# Patient Record
Sex: Male | Born: 1935 | Race: White | Hispanic: No | Smoking: Former smoker
Health system: Southern US, Community
[De-identification: ages and names within clinical notes are randomized; demographics above are authoritative.]

## PROBLEM LIST (undated history)

## (undated) DIAGNOSIS — I2510 Atherosclerotic heart disease of native coronary artery without angina pectoris: Secondary | ICD-10-CM

## (undated) DIAGNOSIS — I10 Essential (primary) hypertension: Secondary | ICD-10-CM

## (undated) DIAGNOSIS — E785 Hyperlipidemia, unspecified: Secondary | ICD-10-CM

## (undated) DIAGNOSIS — R079 Chest pain, unspecified: Secondary | ICD-10-CM

## (undated) DIAGNOSIS — E119 Type 2 diabetes mellitus without complications: Secondary | ICD-10-CM

## (undated) HISTORY — DX: Chest pain, unspecified: R07.9

## (undated) HISTORY — PX: CORONARY ANGIOPLASTY: SHX604

## (undated) HISTORY — DX: Atherosclerotic heart disease of native coronary artery without angina pectoris: I25.10

## (undated) HISTORY — DX: Hyperlipidemia, unspecified: E78.5

## (undated) HISTORY — PX: CARDIAC SURGERY: SHX584

## (undated) HISTORY — DX: Essential (primary) hypertension: I10

## (undated) HISTORY — PX: EYE SURGERY: SHX253

---

## 1998-06-30 ENCOUNTER — Emergency Department (HOSPITAL_COMMUNITY): Admission: EM | Admit: 1998-06-30 | Discharge: 1998-07-01 | Payer: Self-pay | Admitting: Emergency Medicine

## 1998-06-30 ENCOUNTER — Encounter: Payer: Self-pay | Admitting: Emergency Medicine

## 1998-07-06 ENCOUNTER — Encounter: Payer: Self-pay | Admitting: *Deleted

## 1998-07-06 ENCOUNTER — Ambulatory Visit (HOSPITAL_COMMUNITY): Admission: RE | Admit: 1998-07-06 | Discharge: 1998-07-06 | Payer: Self-pay | Admitting: *Deleted

## 1999-09-17 ENCOUNTER — Ambulatory Visit (HOSPITAL_COMMUNITY): Admission: RE | Admit: 1999-09-17 | Discharge: 1999-09-17 | Payer: Self-pay | Admitting: Internal Medicine

## 1999-09-17 ENCOUNTER — Encounter: Payer: Self-pay | Admitting: Internal Medicine

## 2000-01-09 ENCOUNTER — Inpatient Hospital Stay (HOSPITAL_COMMUNITY): Admission: EM | Admit: 2000-01-09 | Discharge: 2000-01-11 | Payer: Self-pay | Admitting: Emergency Medicine

## 2000-01-09 ENCOUNTER — Encounter: Payer: Self-pay | Admitting: Emergency Medicine

## 2000-01-29 ENCOUNTER — Encounter (HOSPITAL_COMMUNITY): Admission: RE | Admit: 2000-01-29 | Discharge: 2000-04-28 | Payer: Self-pay | Admitting: *Deleted

## 2000-03-18 ENCOUNTER — Ambulatory Visit (HOSPITAL_COMMUNITY): Admission: RE | Admit: 2000-03-18 | Discharge: 2000-03-18 | Payer: Self-pay | Admitting: *Deleted

## 2000-05-19 ENCOUNTER — Ambulatory Visit (HOSPITAL_BASED_OUTPATIENT_CLINIC_OR_DEPARTMENT_OTHER): Admission: RE | Admit: 2000-05-19 | Discharge: 2000-05-19 | Payer: Self-pay | Admitting: *Deleted

## 2000-05-19 ENCOUNTER — Encounter (INDEPENDENT_AMBULATORY_CARE_PROVIDER_SITE_OTHER): Payer: Self-pay | Admitting: Specialist

## 2000-10-14 ENCOUNTER — Ambulatory Visit (HOSPITAL_COMMUNITY): Admission: RE | Admit: 2000-10-14 | Discharge: 2000-10-15 | Payer: Self-pay | Admitting: *Deleted

## 2000-10-14 ENCOUNTER — Encounter: Payer: Self-pay | Admitting: *Deleted

## 2001-01-12 ENCOUNTER — Inpatient Hospital Stay (HOSPITAL_COMMUNITY): Admission: EM | Admit: 2001-01-12 | Discharge: 2001-01-14 | Payer: Self-pay | Admitting: Emergency Medicine

## 2001-10-05 ENCOUNTER — Inpatient Hospital Stay (HOSPITAL_COMMUNITY): Admission: EM | Admit: 2001-10-05 | Discharge: 2001-10-06 | Payer: Self-pay | Admitting: Emergency Medicine

## 2001-10-05 ENCOUNTER — Encounter: Payer: Self-pay | Admitting: Cardiovascular Disease

## 2001-10-08 ENCOUNTER — Ambulatory Visit (HOSPITAL_COMMUNITY): Admission: RE | Admit: 2001-10-08 | Discharge: 2001-10-08 | Payer: Self-pay | Admitting: Cardiovascular Disease

## 2001-10-08 ENCOUNTER — Encounter: Payer: Self-pay | Admitting: Cardiovascular Disease

## 2001-12-11 ENCOUNTER — Ambulatory Visit (HOSPITAL_COMMUNITY): Admission: RE | Admit: 2001-12-11 | Discharge: 2001-12-11 | Payer: Self-pay | Admitting: Cardiovascular Disease

## 2001-12-11 ENCOUNTER — Encounter: Payer: Self-pay | Admitting: Cardiovascular Disease

## 2002-06-28 ENCOUNTER — Ambulatory Visit (HOSPITAL_COMMUNITY): Admission: RE | Admit: 2002-06-28 | Discharge: 2002-06-28 | Payer: Self-pay | Admitting: Cardiovascular Disease

## 2002-06-28 ENCOUNTER — Encounter: Payer: Self-pay | Admitting: Cardiovascular Disease

## 2002-11-09 ENCOUNTER — Encounter (HOSPITAL_BASED_OUTPATIENT_CLINIC_OR_DEPARTMENT_OTHER): Admission: RE | Admit: 2002-11-09 | Discharge: 2002-11-23 | Payer: Self-pay | Admitting: Internal Medicine

## 2002-12-22 ENCOUNTER — Encounter (HOSPITAL_BASED_OUTPATIENT_CLINIC_OR_DEPARTMENT_OTHER): Admission: RE | Admit: 2002-12-22 | Discharge: 2003-01-28 | Payer: Self-pay | Admitting: Internal Medicine

## 2003-02-18 ENCOUNTER — Encounter (HOSPITAL_BASED_OUTPATIENT_CLINIC_OR_DEPARTMENT_OTHER): Admission: RE | Admit: 2003-02-18 | Discharge: 2003-02-24 | Payer: Self-pay | Admitting: Internal Medicine

## 2003-05-25 ENCOUNTER — Encounter (HOSPITAL_BASED_OUTPATIENT_CLINIC_OR_DEPARTMENT_OTHER): Admission: RE | Admit: 2003-05-25 | Discharge: 2003-06-22 | Payer: Self-pay | Admitting: Internal Medicine

## 2003-11-08 ENCOUNTER — Ambulatory Visit (HOSPITAL_COMMUNITY): Admission: RE | Admit: 2003-11-08 | Discharge: 2003-11-08 | Payer: Self-pay | Admitting: Cardiovascular Disease

## 2003-11-08 HISTORY — PX: CARDIAC CATHETERIZATION: SHX172

## 2003-11-11 ENCOUNTER — Ambulatory Visit: Payer: Self-pay | Admitting: Internal Medicine

## 2003-11-17 ENCOUNTER — Inpatient Hospital Stay (HOSPITAL_COMMUNITY): Admission: AD | Admit: 2003-11-17 | Discharge: 2003-11-20 | Payer: Self-pay | Admitting: Cardiovascular Disease

## 2004-01-11 ENCOUNTER — Ambulatory Visit: Payer: Self-pay | Admitting: Internal Medicine

## 2004-01-16 ENCOUNTER — Ambulatory Visit: Payer: Self-pay | Admitting: Internal Medicine

## 2004-02-10 ENCOUNTER — Ambulatory Visit: Payer: Self-pay | Admitting: Internal Medicine

## 2004-04-05 ENCOUNTER — Ambulatory Visit: Payer: Self-pay | Admitting: Internal Medicine

## 2004-05-11 ENCOUNTER — Ambulatory Visit: Payer: Self-pay | Admitting: Internal Medicine

## 2004-05-25 ENCOUNTER — Ambulatory Visit: Payer: Self-pay | Admitting: Internal Medicine

## 2004-10-12 ENCOUNTER — Ambulatory Visit: Payer: Self-pay | Admitting: Internal Medicine

## 2005-02-08 ENCOUNTER — Ambulatory Visit: Payer: Self-pay | Admitting: Internal Medicine

## 2005-06-14 ENCOUNTER — Ambulatory Visit: Payer: Self-pay | Admitting: Internal Medicine

## 2005-10-31 ENCOUNTER — Observation Stay (HOSPITAL_COMMUNITY): Admission: EM | Admit: 2005-10-31 | Discharge: 2005-11-02 | Payer: Self-pay | Admitting: *Deleted

## 2005-11-01 HISTORY — PX: CARDIAC CATHETERIZATION: SHX172

## 2005-11-06 ENCOUNTER — Ambulatory Visit: Payer: Self-pay | Admitting: Gastroenterology

## 2005-11-08 HISTORY — PX: OTHER SURGICAL HISTORY: SHX169

## 2005-11-22 ENCOUNTER — Ambulatory Visit: Payer: Self-pay | Admitting: Internal Medicine

## 2006-04-25 ENCOUNTER — Ambulatory Visit: Payer: Self-pay | Admitting: Internal Medicine

## 2006-09-12 ENCOUNTER — Encounter: Payer: Self-pay | Admitting: Internal Medicine

## 2006-10-17 ENCOUNTER — Ambulatory Visit: Payer: Self-pay | Admitting: Internal Medicine

## 2007-01-22 ENCOUNTER — Encounter: Payer: Self-pay | Admitting: Internal Medicine

## 2007-10-14 ENCOUNTER — Telehealth: Payer: Self-pay | Admitting: Internal Medicine

## 2007-10-15 ENCOUNTER — Ambulatory Visit: Payer: Self-pay | Admitting: Internal Medicine

## 2007-10-15 DIAGNOSIS — I10 Essential (primary) hypertension: Secondary | ICD-10-CM | POA: Insufficient documentation

## 2007-10-15 DIAGNOSIS — J069 Acute upper respiratory infection, unspecified: Secondary | ICD-10-CM | POA: Insufficient documentation

## 2007-10-15 DIAGNOSIS — E119 Type 2 diabetes mellitus without complications: Secondary | ICD-10-CM | POA: Insufficient documentation

## 2007-11-04 ENCOUNTER — Telehealth: Payer: Self-pay | Admitting: Internal Medicine

## 2008-01-11 ENCOUNTER — Encounter: Payer: Self-pay | Admitting: Internal Medicine

## 2008-02-29 ENCOUNTER — Ambulatory Visit: Payer: Self-pay | Admitting: Internal Medicine

## 2008-02-29 DIAGNOSIS — M545 Low back pain, unspecified: Secondary | ICD-10-CM | POA: Insufficient documentation

## 2008-02-29 DIAGNOSIS — I2510 Atherosclerotic heart disease of native coronary artery without angina pectoris: Secondary | ICD-10-CM | POA: Insufficient documentation

## 2008-02-29 DIAGNOSIS — E785 Hyperlipidemia, unspecified: Secondary | ICD-10-CM | POA: Insufficient documentation

## 2008-02-29 DIAGNOSIS — K219 Gastro-esophageal reflux disease without esophagitis: Secondary | ICD-10-CM | POA: Insufficient documentation

## 2008-02-29 LAB — CONVERTED CEMR LAB
ALT: 14 units/L (ref 0–53)
AST: 20 units/L (ref 0–37)
Alkaline Phosphatase: 45 units/L (ref 39–117)
Basophils Absolute: 0.1 10*3/uL (ref 0.0–0.1)
Basophils Relative: 0.7 % (ref 0.0–3.0)
Bilirubin, Direct: 0.1 mg/dL (ref 0.0–0.3)
CO2: 28 meq/L (ref 19–32)
Chloride: 100 meq/L (ref 96–112)
Cholesterol: 138 mg/dL (ref 0–200)
LDL Cholesterol: 87 mg/dL (ref 0–99)
Lymphocytes Relative: 24.3 % (ref 12.0–46.0)
MCHC: 33.3 g/dL (ref 30.0–36.0)
Neutrophils Relative %: 58.8 % (ref 43.0–77.0)
Potassium: 5.4 meq/L — ABNORMAL HIGH (ref 3.5–5.1)
RBC: 4.92 M/uL (ref 4.22–5.81)
Sodium: 136 meq/L (ref 135–145)
Total Bilirubin: 0.7 mg/dL (ref 0.3–1.2)
VLDL: 16 mg/dL (ref 0–40)

## 2008-05-23 ENCOUNTER — Encounter: Payer: Self-pay | Admitting: Internal Medicine

## 2008-05-23 HISTORY — PX: NM MYOCAR PERF WALL MOTION: HXRAD629

## 2008-05-30 ENCOUNTER — Encounter: Payer: Self-pay | Admitting: Internal Medicine

## 2008-07-04 ENCOUNTER — Ambulatory Visit: Payer: Self-pay | Admitting: Internal Medicine

## 2008-09-26 ENCOUNTER — Ambulatory Visit: Payer: Self-pay | Admitting: Internal Medicine

## 2008-09-26 LAB — CONVERTED CEMR LAB: Hgb A1c MFr Bld: 7.2 % — ABNORMAL HIGH (ref 4.6–6.5)

## 2008-10-10 ENCOUNTER — Ambulatory Visit: Payer: Self-pay | Admitting: Internal Medicine

## 2010-05-25 NOTE — Cardiovascular Report (Signed)
[place]. [facility]  Patient:    [name], [name] Visit Number: [id number] MRN: [id number]          Service Type: MED Location: [phone] Attending Physician:  Daisey Must Dictated by:   Daisey Must, M.D. [facility] Proc. Date: 01/13/01 Admit Date:  01/12/2001   CC:         Justine Null, M.D. [facility]             Cardiac Catheterization Lab                        Cardiac Catheterization  PROCEDURE PERFORMED:  Left heart catheterization with coronary angiography and left ventriculography.  INDICATIONS:  Mr. Volkov is a 77 year old male with a history of coronary artery disease.  He had two stents placed in the mid left anterior descending artery in January 2002.  In October 2002, he had in-stent restenosis and was treated with cutting balloon angioplasty.  He presented now with recurrent episodes of progressive chest pain and was referred for cardiac catheterization.  PROCEDURAL NOTE:  A #6 French sheath was placed in the right femoral artery. Left coronary angiography was performed with a JL5 catheter.  Right coronary angiography was performed with a JR4 catheter.  Left ventriculography was performed with an angled pigtail catheter.  Contrast was Omnipaque.  There were no complications.  RESULTS:  Hemodynamics:  Left ventricular pressure 127/18.  Aortic pressure 127/68. There was no aortic valve gradient.  Left ventriculogram:  Wall motion is normal.  Ejection fraction was estimated at greater than or equal to 65%.  There was no mitral regurgitation.  Coronary arteriography (right dominant): 1. The left main has a distal 20% stenosis. 2. The left anterior descending artery has tandem stents in the mid LAD.    There is a diffuse 20% stenosis within the stent.  In the mid portion of    the stents is a focal 40% stenosis.  Just beyond the stents in the mid LAD    is a 20% stenosis.  The apical LAD which is relatively small in size has  an 80% stenosis.  The LAD gives rise to a normal sized first diagonal which    has a tubular 60-70% stenosis.  There is a small second diagonal and a    small third diagonal.  The third diagonal has a 60% stenosis at its origin. 3. The left circumflex has a 30% stenosis proximally.  It gives rise to a    small ramus intermedius followed by a large branching first obtuse    marginal.  There is a 30% stenosis in the proximal portion of the obtuse    marginal.  The distal circumflex gives rise to small second and third    obtuse marginal branches. 4. The right coronary artery has a 30% stenosis in the proximal to mid vessel.    In the distal vessel just before the posterior descending artery is a 20%    stenosis.  In the AV groove portion of the right coronary artery beyond the    posterior descending artery is a 30% stenosis.  The distal right coronary    artery gives rise to a large posterior descending artery and three small    posterolateral branches.  IMPRESSIONS: 1. Normal left ventricular systolic function. 2. One-vessel coronary artery disease as described with patent stents in the    mid left anterior descending artery.  There is moderate disease in  the    first diagonal branch and significant disease in the apical portion of the    left anterior descending artery which is fairly small in caliber and    supplies a small area of myocardium.  PLAN:  The patient will be managed medically. Dictated by:   Daisey Must, M.D. [facility] Attending Physician:  Daisey Must DD:  01/13/01 TD:  01/13/01 Job: [id number]

## 2010-05-25 NOTE — Cardiovascular Report (Signed)
[place]. [facility]  Patient:    [name], [name]                      MRN: [id number] Proc. Date: 03/18/00 Adm. Date:  [id number] Disc. Date: [id number] Attending:  Daisey Must CC:         Corwin Levins, M.D. [facility]  Cardiac Catheterization Laboratory   Cardiac Catheterization  PROCEDURES PERFORMED:  Left heart catheterization with coronary angiography and left ventriculography.  INDICATIONS:  [name] is a 77 year old male who underwent stent placement x 2 in the mid left anterior descending artery two months ago.  He has been experiencing somewhat atypical chest pain.  A stress Cardiolite showed a mild anterior and apical ischemia.  We therefore opted to proceed with cardiac catheterization to rule out re-stenosis.  DESCRIPTION OF PROCEDURE:  A 6 French sheath was placed in the right femoral artery.  Catheters utilized included a 6 [place] JL5, 6 [place] JR4 and 6 [place] angled pigtail.  Contrast was Omnipaque.  There were no complications.  RESULTS:  HEMODYNAMICS:  Left ventricular pressure 125/14.  Aortic pressure 125/70. There was no aortic valve gradient.  LEFT VENTRICULOGRAM:  Wall motion is normal.  Ejection fraction calculated at 62%.  There is no mitral regurgitation.  CORONARY ARTERIOGRAPHY:  (Right dominant).  Left main:  Left main has a distal 20% stenosis.  Left anterior descending:  The left anterior descending artery has two stents in the mid vessel with the first stent beginning just after the origin of the first diagonal branch.  Within the proximal portion of the first stent there is a discrete 40% stenosis.  In the second stent there is a discrete 50% stenosis.  Just beyond the stent is a diffuse 20% stenosis.  There is a normal sized first diagonal which has a mid 50% stenosis.  Left circumflex:  The left circumflex has a 20% stenosis at its origin.  It gives rise to a very large branching first obtuse marginal which has  a 20% stenosis.  The second obtuse marginal is small.  Right coronary artery:  The right coronary artery is a dominant vessel.  There is a diffuse 20% stenosis in the proximal vessel.  The distal vessel has minor luminal irregularities.  There is a normal sized posterior descending artery with a tubular 40% stenosis in the mid vessel.  The distal right coronary gives rise to three small posterolateral branches.  IMPRESSIONS: 1. Normal left ventricular systolic function. 2. Moderate disease within the stented portion of the mid left anterior    descending as described which does not appear to be flow-limiting    at this time.  PLAN:  The patient will be continued on medical therapy.  He will be observed closely with a followup Cardiolite scan performed in four to six months to rule out progressive re-stenosis. DD:  03/18/00 TD:  03/19/00 Job: [id number]

## 2010-05-25 NOTE — H&P (Signed)
[facility]  Patient:    [name], [name]                        MRN: [id number] Adm. Date:  01/08/00 Attending:  Corwin Levins, M.D. [facility]                         History and Physical  CHIEF COMPLAINT:  Chest pain.  HISTORY OF PRESENT ILLNESS:  [name] is a 77 year old white male with one week of exertional dull substernal chest discomfort, worse the last two to three weeks, now onset at approximately 10 yards, sometimes with mild shortness of breath and diaphoresis but no nausea or vomiting.  No prior history of coronary artery disease.  No palpitations or lightheadedness. There is fatigue and some sense of ankle puffiness off and on but no real swelling.  This pain is different from his occasional heartburn.  There is no pain now, none at rest in the week prior.  He had a stress test approximately 10 years at [facility] and negative.  Cardiac risk factors include diabetes, high cholesterol, family history.  Negative for tobacco and hypertension.  PAST MEDICAL HISTORY Illnesses: 1. Diabetes. 2. Hypercholesterolemia. 3. DJD of the hands. 4. GERD/hiatal hernia.  Surgeries:  Status post lumbar disk surgery in 1978.  ALLERGIES:  No known drug allergies.  CURRENT MEDICATIONS 1. Glucophage 500 mg b.i.d., which he only takes q.d. 2. Celebrex p.r.n. 3. Aspirin p.r.n. pain.  SOCIAL HISTORY:  Tobacco:  Quit for 25 years.  Alcohol:  Occasional beer. Married and lives in [place] with wife.  Worked for a [employer]; just retired, June 2001.  He is a former patient of Dr. Fayrene Fearing C. Charmian Muff, who has recently retired.  He saw Dr. Titus Dubin. Alwyn Ren recently for questionable spot on his chest x-ray after retirement physical.  Followup chest x-ray and ECG per Dr. Alwyn Ren apparently negative for significant abnormality.  FAMILY HISTORY:  Father with coronary artery disease.  REVIEW OF SYSTEMS:  Otherwise as above.  PHYSICAL EXAMINATION  GENERAL:  Mr.  [name] is alert and oriented x 3, pleasant.  VITAL SIGNS:  Blood pressure 175/85.  Heart rate 71.  Respirations 20. Temperature 97.1.  NECK:  Without JVD.  CHEST:  No rales or wheezing.  CARDIAC:  Regular rate and rhythm.  ABDOMEN:  Soft and nontender.  Positive bowel sounds.  No organomegaly.  No masses.  EXTREMITIES:  No edema.  NEUROLOGIC:  Cranial nerves II-XII intact, otherwise, nonfocal.  LABORATORY AND X-RAY FINDINGS:  ECG:  Sinus with occasional PVC, nonspecific ST-T wave changes.  No acute changes suggestive of ischemia specifically.  Chest x-ray pending.  CBC, CMET, CPK and troponin I pending.  ASSESSMENT AND PLAN 1. Chest pain consistent with accelerated/new-onset fairly typical exertional    angina consistent with flow-limiting coronary artery disease:  He is to be    admitted.  Given the minimal level of exertion required for symptoms, will    need to place on telemetry and rule out myocardial infarction with serial    enzymes.  Start aspirin 325 mg p.o. q.d. and Toprol XL 25 mg p.o. q.d.    Check lipids in the a.m.  Will need cardiology consultation.  Will likely    need cardiac catheterization to define anatomy and possibly render    treatment, etc.  Will need to follow up labs and chest x-ray, as above. 2. Diabetes mellitus:  Check  hemoglobin A1c.  Check capillary blood glucoses    and sliding-scale insulin.  Hold the Glucophage, given probable    catheterization and dye load.  Will hydrate tonight. 3. Hypertension, mildly increased today, no prior history of treatment per    patient; on Toprol, as above. 4. Hypercholesterolemia:  For statin if LDL increased greater than 100 and    proves to have coronary artery disease. 5. Gastroesophageal reflux disease:  Start empiric Prevacid 30 mg p.o. q.d. 6. Hand degenerative joint disease:  Okay to continue the Celebrex but also    some recent evidence Vioxx and ibuprofen increase cardiovascular risk. DD:   01/09/00 TD:  01/09/00 Job: [id number]

## 2010-05-25 NOTE — Discharge Summary (Signed)
[place]. [facility]  Patient:    [name], [name] Visit Number: [id number] MRN: [id number]          Service Type: MED Location: [phone] Attending Physician:  Talitha Givens Dictated by:   Tereso Newcomer, P.A. Admit Date:  01/12/2001 Discharge Date: 01/14/2001   CC:         Justine Null, M.D. [facility]                           Discharge Summary  DATE OF BIRTH:  1932-09-12  REASON FOR ADMISSION:  Unstable angina.  DISCHARGE DIAGNOSES: 1. One-vessel coronary artery disease--medical therapy.    a. Status post stenting x2 to the left anterior descending in January 2002.    b. Status post cutting balloon percutaneous transluminal coronary       angioplasty October 14, 2000 for a 95% in-stent restenosis.    c. Catheterization this admission revealing patent tandem stents in the mid       left anterior descending with 20% diffuse stenosis within the stent,       midportion of the stent with focal 40% stenosis, 20% stenosis in the mid       left anterior descending just beyond the stents, apical left anterior       descending (relatively small) with 80% stenosis; first diagonal branch       with tubular 60-70% stenosis, third diagonal branch with 60% stenosis at       the origin; left circumflex with 30% stenosis proximally, obtuse       marginal 30% proximal stenosis.  Right coronary artery with 30% stenosis       in the proximal mid vessel, 20% stenosis in the distal vessel just       before the posterior descending artery, 30% stenosis in the       arteriovenous groove portion of the right coronary artery beyond the       posterior descending artery.  Left ventriculogram with normal wall       motion with ejection fraction greater than 65%. 2. Hypertension. 3. Diabetes mellitus, type 2. 4. Hyperlipidemia. 5. Osteoarthritis.  HOSPITAL COURSE:  [name] is a 77 year old male with history of one-vessel CAD and PCI x2 within the last  year who presented to the office on January 12, 2001 with recurrent symptoms of unstable angina.  In the office, his blood pressure was 140/78.  Neck without JVD.  Heart normal S1, S2. Positive S4.  Sinus rhythm.  Lungs clear.  Abdomen soft.  Extremities with 1-2+ edema.  EKG normal sinus rhythm.  Heart rate 61.  No acute changes. [name] was admitted for unstable angina.  He was monitored on telemetry. Cardiac enzymes were negative x3.  He went for a cardiac catheterization on January 13, 2001 by Dr. Loraine Leriche Pulsipher.  The results are noted above in the discharge diagnoses.  Given the patients one-vessel CAD with many small vessel disease in first diagonal and apical LAD and patent stents in the mid LAD, it was felt that the patient should continue medical therapy.  Imdur 30 mg a day was added.  On January 14, 2001, the patient felt well without any further chest pain or shortness of breath.  His right groin was without hematoma or bruits.  It was felt that he was stable enough for discharge to home and would undergo outpatient rest stress Cardiolite testing in  1-2 weeks to evaluate his one-vessel CAD.  LABORATORY DATA:  White blood cell count 10,800, hemoglobin 13.1, hematocrit 38.5, platelet count 238,000.  Sodium 136, potassium 4, chloride 103, CO2 26, glucose 103, BUN 21, creatinine 0.9.  Cardiac enzymes negative x3.  DISCHARGE MEDICATIONS:  1. Imdur 30 mg a day.  2. Toprol XL 25 mg q.d.  3. Lipitor 10 mg q.h.s.  4. Cozaar 100 mg q.d.  5. Coated aspirin 325 mg q.d.  6. Actos 45 mg a day.  7. Prandin 2 mg t.i.d. with meals.  8. Nexium 40 mg q.d.  9. FOLTX q.d. 10. Glucophage XR 500 mg b.i.d. to be restarted on Thursday,     January 15, 2001. 11. Vioxx 25 mg a day. 12. Nitroglycerin p.r.n. chest pain.  ACTIVITY:  No driving, heavy lifting, exertion, or work for three days.  DIET:  Low-fat/low-sodium diabetic diet.  DISCHARGE INSTRUCTIONS:  He is to call our office for any  groin swelling, bleeding, or bruising.  He will be set up for an adenosine rest stress Cardiolite on Monday, January 20, at 8 a.m. and he will see Dr. Gerri Spore on the same day at 2 p.m. Dictated by:   Tereso Newcomer, P.A. Attending Physician:  Talitha Givens DD:  01/14/01 TD:  01/14/01 Job: [id number]

## 2010-05-25 NOTE — H&P (Signed)
[place]. [facility]  Patient:    [name], [name] Visit Number: [id number] MRN: [id number]          Service Type: EMS Location: [facility] Attending Physician:  Nelia Shi Dictated by:   Dian Queen, P.A.C. [facility] Admit Date:  01/12/2001   CC:         Justine Null, M.D. [facility]   History and Physical  CHIEF COMPLAINT:  Chest pain.  HISTORY OF PRESENT ILLNESS:  The patient is a 77 year old married white male with known coronary disease.  In January 2002, he was admitted with unstable angina and, at catheterization, was found to have a 95% lesion in a mid-LAD that was moderately angulated and difficulty to intervene upon.  Ultimately, the lesion was successfully dilated and stented x 2 with reduction in stenosis from 95% to 0% without complication.  This was done on January 10, 2000.  In March 2002, he was recatheterized and there was moderate disease within the stented portion of the LAD.  At that time, he was continued on medical therapy.  He was last catheterized on October 14, 2000 and the LAD lesion had stenosed to 95% (in the midportion of the stent), and this was opened to less than 20% with cutting balloon.  Subsequent to that, he did well for a couple of months but, for the last three weeks or so, has developed recurrent exertional chest pressure.  Over the weekend, he has had some left-sided pressure a little different from before, radiating down his left arm and relieved with nitroglycerin.  He feels that his symptoms are gradually worsening.  He feels a little "achy" in his left chest today.  He is admitted now for further evaluation and therapy and further intervention.  PAST MEDICAL HISTORY:  No prior hospitalizations other than above.  No prior surgery.  He does have dyslipidemia and diabetes.  ALLERGIES:  None to medications.  REVIEW OF SYSTEMS:  He does have some arthritis, for which he takes Vioxx.  No melena or hematochezia.  he has  a history of hemorrhoids, but no recent flare symptoms.  Decreased hearing in his right ear.  He wears partial upper and lower dentures.  No GU symptoms.  FAMILY HISTORY:  His father died of heart disease and mother of unknown etiology.  He has no siblings.  CURRENT MEDICATIONS:  1. Toprol XL 25 mg q.d.  2. Lipitor 20 mg q.d.  3. Altace 10 mg b.i.d. (he complains now of a chronic cough).  4. Aspirin 325 q.d.  5. Glucophage XR 5 mg b.i.d.  6. Nexium 40 mg q.d.  7. Vioxx 25 mg q.d.  8. ______ q.d.  9. Actos 45 mg q.d. 10. Prandin 2 mg q.d.  PHYSICAL EXAMINATION:  GENERAL:  Pleasant gentleman.  VITAL SIGNS:  Blood pressure 140/78, sinus rhythm.  HEENT:  Extraocular muscles intact.  Sclerae anicteric.  Conjunctivae injected bilaterally.  Face symmetrical.  Lids normal.  NECK:  Supple without thyromegaly.  No JVD or HJR.  No bruits.  HEART:  Sinus rhythm.  S1 and S2 normal.  S4 is present.  Quiet ______.  No S3.  LUNGS:  Clear bilaterally.  ABDOMEN:  Soft without masses, hepatosplenomegaly or bruits.  EXTREMITIES:  Active pedal pulses with 1 to 2+ edema.  NEUROLOGIC:  Normal.  IMPRESSION:  1. Coronary disease with prior LAD stent x 2 in January 2002 , cutting     balloon PTCA for in-stent restenosis on October 14, 2000, now  complaining     of recurrent chest pain compatible with recurrent angina.  2. Dyslipidemia.  3. Controlled hypertension.  DISPOSITION:  After conferring with Dr. Gerri Spore, will bring him into the hospital today for cardiac catheterization tomorrow and perhaps use brachy therapy, albeit the tortuosity of his vessel may preclude such.  Will begin heparin and IV nitroglycerin.  The patient was seen by myself and Dr. Myrtis Ser today. Dictated by:   Dian Queen, P.A.C. [facility] Attending Physician:  Nelia Shi DD:  01/12/01 TD:  01/12/01 Job: [id number]

## 2010-05-25 NOTE — Op Note (Signed)
[name]               ACCOUNT NO.:  [id number]   MEDICAL RECORD NO.:  [id number]          PATIENT TYPE:  INP   LOCATION:  3704                         FACILITY:  [facility]   PHYSICIAN:  Nanetta Batty, M.D.   DATE OF BIRTH:  Apr 14, 1932   DATE OF PROCEDURE:  DATE OF DISCHARGE:                                 OPERATIVE REPORT   [name] is a 77 year old white male with history of CAD, status post  LAD stenting in the past.  He was catheterized in January of 2003 and found  to have no evidence of in-stent restenosis.  He was catheterized in January  of 2005 and found to have a widely patent LAD stent with spasm of the ostium  of the RCA and a hypodense segmental mid-RCA lesion which appeared 50%  angiographically.  He was discharged home and underwent Cardiolite stress  testing that showed inferolateral ischemia.  He has had recurrent chest  pain.  He presents now for diagnostic RCA angiography with IVUS  interrogation and potential intervention.   PROCEDURE DESCRIPTION:  The patient was brought to the Second Floor [facility]iac Catheterization Laboratory in the postabsorptive state.  He was  premedicated with p.o. Valium and IV Nubain.  His right groin was prepped  and shaved in the usual sterile fashion.  One percent Xylocaine was used for  local anesthesia.  A 6 upgraded to a 7-French sheath was inserted into the  right femoral artery using the standard Seldinger technique.  A 6-French  sheath was inserted into the right femoral vein.  Initial angiography was  performed with a 5-French __________ catheter revealing the mid-RCA lesion.  The patient then received 4000 units of heparin, followed at the end of the  case by an additional 2000 units of heparin, making a total of 6000.  He  received aspirin this morning while on the floor and Plavix 600 mg p.o. in  the catheterization laboratory prior to intervention.  He received  Integrilin double bolus infusion.   Using a 6-French JR-4 guide catheter with side holes as well a __________  190 Sport guide wire, two 5 x 15 Maverick, PTCA was performed at nominal  pressures.  Following this, attempts were made to pass a 3.5 x 18 CYPHER;  however, this did not navigate the proximal bend of the RCA.  There was a  poor backup and guide wire bias.  Following this, the guide wire and  catheter was removed, and a 7-French hockey stick with side-hole guide  catheter along with the Sport guide wire and a BMW 190 buddy wire was then  used.  The 3.0 x 15 Maverick was then used to predilate the lesion once  again, and the 3.5 x 18 CYPHER was deployed over the [employer] wire using the  Sport as the buddy wire.  The stent passed easily to the desired location,  and the Sport wire was withdrawn.  The stent was then carefully positioned  under fluoroscopic and angiographic control and deployed at 12-14  atmospheres.  It was post dilated with a 3.75 x 15 Quantum  Maverick up to 16  atmospheres (3.8 mm).  Then 200 mcg of intracoronary nitroglycerin was then  administered.  The patient did complain of some back pain.  Post-procedure  IVUS revealed the interstrut diameter of approximately 3.2 mm;  however,  there was a small area of prolapse in the proximal stent seen by both IVUS  and angiography.  This was not flow limiting.   OVERALL IMPRESSION:  Successful intravascular ultrasound-guided percutaneous  intervention and stenting of a segmental mid-dominant right coronary lesion  using a 3.75 x 18 CYPHER stent.  The final ACT was 263.  The patient was  pain free at the end of the procedure.  The guide wire and catheter was  removed.  The sheath was securely in place.  The patient left the laboratory  in stable condition.  He will receive 18 hours of Integrilin and  treatment with aspirin and Plavix.  He will be discharged home in the  morning and will see me back in the office in 1-2 weeks for followup.  He  will need a  follow-up Cardiolite stress test in approximately 6-8 weeks.  He  left the laboratory in stable condition.       JB/MEDQ  D:  11/18/2003  T:  11/19/2003  Job:  [id number]   cc:   Second Floor [facility] Cardiiac Cath Lab   [facility]  [address]  [place], [place] [address]

## 2010-05-25 NOTE — Cardiovascular Report (Signed)
[place]. [facility]  Patient:    [name], [name]                      MRN: [id number] Proc. Date: 01/10/00 Adm. Date:  [id number] Attending:  Corwin Levins CC:         Corwin Levins, M.D. [facility]  Cath Lab   Cardiac Catheterization  PROCEDURE:  Coronary angiography, left ventriculography, LIMA angiography - Judkins technique.  INDICATIONS:  The patient is a 77 year old married white male with history of typical exertional angina, increased in frequency in the past few days.  RESULTS:  Pressures; LV 138/12, AO systolic 138/70.  ANGIOGRAPHY:  There is no significant calcification.  The left main coronary artery is normal.  The left anterior descending fills first diagonal with 70% lesion and there is a focal lesion after the first diagonal of 95%.  The remainder of the LAD is normal.  The circumflex is normal.  The right coronary artery is normal.  The left ventricle is normal.  The LIMA was normal.  Flush injection of the abdominal aorta revealed no aneurysm.  This was not recorded.  SUMMARY:  Single vessel coronary artery disease with 95% focal proximal LAD stenosis and 70% segmental lesion small caliber first diagonal.  The LV was normal and LIMA was patent.  The patient reviewed with Dr. Gerri Spore who felt that percutaneous intervention of the LAD was indicated. DD:  01/10/00 TD:  01/10/00 Job: [id number]

## 2010-05-25 NOTE — Discharge Summary (Signed)
NAME:  [name], [name]                         ACCOUNT NO.:  [id number]   MEDICAL RECORD NO.:  [id number]                   PATIENT TYPE:  INP   LOCATION:  2016                                 FACILITY:  [facility]   PHYSICIAN:  [name], M.D.             DATE OF BIRTH:  [birth year]   DATE OF ADMISSION:  10/05/2001  DATE OF DISCHARGE:                                 DISCHARGE SUMMARY   DISCHARGE DIAGNOSES:  1. Chest pain; a)  negative myocardial infarction; b)  stable coronary     artery disease.  2. Dilated aortic root.  3. History of coronary artery disease with stents times two in the left     anterior descending.  4. Hypertension.  5. Diabetes mellitus type 2.  6. Hyperlipidemia.   DISCHARGE CONDITION:  Improved.   PROCEDURE:  Combined left heart catheterization on October 06, 2001 by Dr.  Allyson [name].   DISCHARGE MEDICATIONS:  1. Toprol XL 25 mg daily.  2. Lipitor 20 mg daily.  3. Isosorbide 20 mg one pill twice daily.  4. Enteric coated aspirin 325 mg daily.  5. Actos 45 mg daily.  6. Prandin 2 mg daily.  7. Nexium 40 mg daily.  8. Foltx daily.  9. Altace 10 mg one pill twice a day.  10.      Vioxx 25 mg daily.  11.      No Glucophage XR until Friday morning, then resume regular dose 500     mg one three times a day.  Please check with the radiologist on Thursday     to see if he can restart the Glucophage on Friday.  12.      Nitroglycerin 1/150 mg sublingual p.r.n. chest pain.   DISCHARGE INSTRUCTIONS:  1. No strenuous activity, no sexual activity, no lifting over ten pounds     until Friday.  2. Low fat, low salt, diabetic diet.  3. Wash right groin cath site with soap and water.  Call if any bleeding,     swelling or drainage.  4. The patient is scheduled for a CAT scan of the chest at [facility] on October 08, 2001 at 11:45 a.m.  He is to go to admitting to     register prior to going to the x-ray department.  He is not to drink     after  midnight prior to the test.  5. Follow-up with Dr. Allyson [name] on Wednesday, October 14, 2001 at 11:15 a.m.     Call the office for any problems or questions at [phone].   HISTORY OF PRESENT ILLNESS:  The patient is a 77 year-old married male who  was changing cardiology service from [place] to [facility] and had an appointment in our office, but unfortunately developed  chest pain on October 05, 2001 and was admitted to [facility].   He has  known coronary artery disease as described under the past medical  history and had begun having recurrent shooting chest pain across the chest  with minimal walking.  The pain would last five to ten seconds. Also he had  constant left breast pain times three to four days.  In the emergency room  he was asymptomatic, but his episodes did feel similar to previous cardiac  pain. He was fatigued a great deal lately.   PAST MEDICAL HISTORY:  1. Coronary artery disease including stent times two to the left anterior     descending in January 2002, angioplasty in October 2002 for a 95% in     stent restenosis.  Cardiac catheterization was done January 2003 showing     patent tandem stents with 20% diffuse stent stenosis, 40% mid stent, 20%     mid LAD stenosis beyond the stents and apical 80% LAD, diagonal one with     a 60 to 70% stenosis, diagonal three with a 60% at origin; left     circumflex 30% proximal OM, 30% proximal RCA with 30% proximal mid vessel     and ejection fraction of 65%.  2. Hypertension.  3. Diabetes.  4. Hyperlipidemia.  5. Osteoarthritis of the knees and hands.  6. L5 back surgery in 1978.   OUTPATIENT MEDICATIONS:  1. Isosorbide mononitrate 20 mg one twice a day.  2. Toprol XL 25 mg daily.  3. Lipitor 20 mg q.h.s.  4. Enteric coated aspirin 325 mg daily.  5. Actos 45 mg daily.  6. Prandin 2 mg daily.  7. Nexium 40 mg daily.  8. Foltx daily.  9. Glucophage XR 500 mg t.i.d.  10.      Vioxx 25 mg  daily.  11.      Nitroglycerin p.r.n.Marland Kitchen  12.      Altace 10 mg p.o. b.i.d.   ALLERGIES:  No known allergies.   SOCIAL HISTORY:  Former pipe and cigar smoker 40 years ago.  Rare alcohol.  He is married with three kids and six grand kids.  Two daughter-in-laws.  He  is retired and works part time parking cars.   FAMILY HISTORY:  Father died in his 70's secondary to heart disease. Mother  died in her 46's to flu.  No siblings.   REVIEW OF SYMPTOMS:  See history and physical.   PHYSICAL EXAMINATION AT DISCHARGE:  VITAL SIGNS:  Blood pressure 134/70,  pulse 70, afebrile, respirations 20.  GENERAL:  Alert and oriented male.  CHEST:  Clear.  CARDIOVASCULAR:  Regular rate and rhythm. normal sinus rhythm.  The right  groin catheterization site is stable.   Cardiac catheterization:  The left ventricle was normal.  The aortic root  was somewhat dilated.  Renals were normal.  He had an LAD stent of diagonal  one which was patent.  He will be followed up with a  CT of his chest.   LABORATORY DATA:  Admitting hemoglobin 12.9, hematocrit 39.2, WBC 7.1,  platelets 220,000, neutrophils 61, lymphs 24.  These remained fairly stable.  Hemoglobin was 11.8 and hematocrit 36.1 at discharge.  Coagulations:  ProTime 13.3, INR 1.0, PTT 27.0 on admission with heparin 93.  Sodium 139,  potassium 4.5, chloride 106,  C02 25, glucose 119, BUN 19, creatinine 1.2.  SGOT 27, SGPT 27, albumin 3.5, calcium 8.6.  Cardiac enzymes:  CK ranged  from 180, 120 and 96, MB 3.6, 2.5 and 1.9, Troponin 0.01 and 0.01.  UA was  clear.  Fasting lipids had not been ordered.   Chest x-ray results are not yet on the chart.   EKG:  Sinus rhythm without acute ischemic changes.   HOSPITAL COURSE:  The patient was admitted on October 05, 2001 by Dr.  Allyson [name] with chest pain and unstable angina.  He was placed on IV heparin,  admitted to the telemetry unit and placed on Plavix.  Plans were made for cardiac catheterization which he  underwent on October 06, 2001.  Stents  were stable and non-critical coronary artery disease otherwise.   The patient's aortic root was somewhat dilated and he will get a CT of his  chest and follow-up with Dr. Allyson [name].       Darcella Gasman. Valarie Merino                     [name], M.D.    LRI/MEDQ  D:  10/06/2001  T:  10/09/2001  Job:  [id number]

## 2010-05-25 NOTE — H&P (Signed)
[name], [name]               ACCOUNT NO.:  [id number]   MEDICAL RECORD NO.:  [id number]          PATIENT TYPE:  INP   LOCATION:  3704                         FACILITY:  [facility]   PHYSICIAN:  Abelino Derrick, P.A.   DATE OF BIRTH:  1932/07/24   DATE OF ADMISSION:  11/17/2003  DATE OF DISCHARGE:  11/20/2003                                HISTORY & PHYSICAL   CHIEF COMPLAINT:  Chest pain.   HISTORY OF PRESENT ILLNESS:  [name] is a 77 year old male followed by  Nanetta Batty, M.D., who had an LAD stent in January 2002 by Carole Binning, M.D.  He had a re-look catheterization in September of 2003 and  again in November of 2005 for chest pain.  His catheterization of November 1  revealed a 50% RCA narrowing and some RCA spasm.  The previous LAD stent  site was patent.  The circumflex was normal.  He had normal left ventricular  function.  Dr. Allyson Sabal ordered a Cardiolite study after his catheterization  which was done November 3.  This did show some mild lateral ischemia and  some inferior ischemia.  The patient presented to the office today for  follow-up of these studies.  He had been complaining of chest pain.  He says  it is low level, about 2 to 3/10. He did take a nitroglycerin without  relief.  Dr. Allyson Sabal feels it is best to admit him for heparin and nitrates  and restudy with IVUS.   PAST MEDICAL HISTORY:  Remarkable for hypertension, treated hyperlipidemia,  non-insulin dependent diabetes.  He has had a previous granuloma on a chest  CT.   CURRENT MEDICATIONS:  1.  Metformin 500 mg twice daily.  2.  Lipitor 40 mg a day.  3.  Aspirin 325 mg a day.  4.  Metoprolol 25 mg a day.  5.  Omeprazole 20 mg a day.  6.  Lisinopril/HCTZ 20/12.5 twice daily.  7.  Imdur 30 mg a day.  8.  Actos 15 mg a day.   ALLERGIES:  No known drug allergies.   SOCIAL HISTORY:  He is married with three children.  He does not smoke.  He  is here with his wife in the office on  admission.   FAMILY HISTORY:  Unremarkable for coronary disease.   REVIEW OF SYSTEMS:  Essentially unremarkable except as noted above.  He has  had some fatigue and some increased dyspnea.   PHYSICAL EXAMINATION:  VITAL SIGNS:  Blood pressure 110/60, pulse 70, weight  246, respiratory rate 12.  GENERAL:  He is a well-developed overweight male in no acute distress.  HEENT:  Normocephalic.  Extraocular movements are intact.  Sclerae are  nonicteric.  Lids and conjunctivae are within normal limits.  NECK:  Without  bruits, without JVD.  CHEST:  Clear to auscultation and percussion.  CARDIAC:  Reveals regular rate and rhythm, without murmurs, rubs or gallops,  normal S1 and S2.  ABDOMEN:  Obese, nontender, no hepatosplenomegaly.  EXTREMITIES:  Without edema.  Distal pulses are intact.  The right femoral  artery site  is without hematoma or ecchymosis.  NEURO:  Exam is grossly intact. He is awake, alert, oriented and  cooperative, moves all extremities without obvious deficit.   LABORATORY DATA:  EKG:  Sinus rhythm, sinus tachycardia without changes.   IMPRESSION:  1.  Chest pain, worrisome for unstable angina.  2.  Coronary disease with left anterior descending stent in January of 2002      and recent catheterization on November 08, 2003 showing a patent LAD      stent site but a 50% right coronary artery with an abnormal Cardiolite      study showing some inferior ischemia on November 10, 2003, rule out      progression.  3.  Non-insulin dependent diabetes.  4.  Treated hypertension.  5.  Treated hyperlipidemia.  6.  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   PLAN:  Patient will be seen by Dr. Allyson Sabal and myself today in the office.  He  is to be admitted to [facility] and set up IV heparin and nitrates  and repeat catheterization with an  IVUS of his right coronary artery  tomorrow.       LKK/MEDQ  D:  01/21/2004  T:  01/21/2004  Job:  [id number]   cc:   Nanetta Batty,  M.D.  Fax: [phone]

## 2010-05-25 NOTE — Consult Note (Signed)
NAME:  [name], [name]                         ACCOUNT NO.:  [id number]   MEDICAL RECORD NO.:  [id number]                   PATIENT TYPE:  REC   LOCATION:                                       FACILITY:  [facility]   PHYSICIAN:  Jonelle Sports. Sevier, M.D.              DATE OF BIRTH:  26-Sep-1932   DATE OF CONSULTATION:  11/15/2002  DATE OF DISCHARGE:                                   CONSULTATION   REFERRING PHYSICIAN:  Sean [name]. Everardo All.   HISTORY:  This 77 year old white male is referred at the courtesy of Dr.  Everardo All for management of [name] blister on the tip of the right hallux and  several other minor foot problems.  Apparently, his diabetes has been in  good control with hemoglobin A16 several months ago of 6.0.  He has not had  significant prior foot problems, although he has had some interdigital  fungus at times and has [name] fissure which tends to occur on the plantar aspect  at the base of the right 4th toe.  This has been recently active, but not [name]  major problem to him.  Some two weeks ago, he rubbed [name] blister on the tip of  the right great toe, presumably from ill-fitting footwear, and was referred  here for that purpose.  Before he could get here, because of my absence, he  went to see Dr. Tinnie Gens [name]. Petrinitz, who drained the fluid from it and, in  fact, it subsequently has healed quite satisfactorily.  He is here now for  our general evaluation and ongoing care.   PAST MEDICAL HISTORY:  Past medical history is notable for osteoarthritis,  coronary artery disease, hyperlipidemia and gastroesophageal reflux.   ALLERGIES:  He has no known medicinal allergies.   MEDICINES:  His regular medicines include Prandin, Glucophage, Actos, Imdur,  metoprolol, Lipitor, Zestoretic, aspirin and omeprazole.   EXAMINATION:  EXTREMITIES:  Examination today is limited to the distal lower  extremities.  The feet are free of gross edema and skin temperatures are  essentially normal and symmetrical.   All pulses are palpable and adequate.  He has protective sensation in his toes but some failure to detect 5 g  monofilament testing under the metatarsal head areas.   On the tip of the right hallux is [name] healed blister which now has left behind  callus-type tissue.  He also has calluses at the tip of the left hallux, tip  of the right 2nd and 4th toes and tip of the left 3rd toe.  He has minor  callus formation under the 1st and 4th metatarsal heads bilaterally.  He has  some interdigital peeling on both feet and indeed [name] crack at the plantar  aspect base of the right 4th toe.   DISPOSITION:  1. The patient is given general instruction regarding footwear and diabetes     by video with nurse  and physician reinforcement.  2. The callused area where the blister previously existed on the tip of the     right hallux is sharply pared away without incident.  The calluses on the     tips of the left hallux, left 3rd toe, and right 2nd and 4th toes are     sharply pared without incident.  The nurse has previously dremeled some     callus material at both heels.  3. The patient is advised to used interdigital Lamisil on both feet and to     obtain lamb's wool spacers to use until this     has healed.  He is particularly to reinforce the area between the 3rd and     4th toes on the right in order to hopefully get this fissure healed.  4. Assuming he does well, he will return here only on [name] nail-care-basis     every three months.                                               Jonelle Sports. Cheryll Cockayne, M.D.    RES/MEDQ  D:  11/15/2002  T:  11/15/2002  Job:  [id number]   cc:   Gregary Signs [name]. Everardo All, M.D. [facility]

## 2010-05-25 NOTE — Cardiovascular Report (Signed)
[name], [name]               ACCOUNT NO.:  [id number]   MEDICAL RECORD NO.:  [id number]          PATIENT TYPE:  OIB   LOCATION:  2899                         FACILITY:  [facility]   PHYSICIAN:  Nanetta Batty, M.D.   DATE OF BIRTH:  [birth year]   DATE OF PROCEDURE:  11/08/2003  DATE OF DISCHARGE:                              CARDIAC CATHETERIZATION   Mr. Wilkie is a 77 year old gentleman status post proximal mid  LAD, PCI  stenting by Dr. Emilie Rutter. Pulsipher in the past.  He was recatheterized by  myself in 2003 and found to have a patent stent with normal RCA.  So the  problems include type 2 diabetes, hyperlipidemia.  He has had crescendo  angina over the last two weeks and was admitted now electively for  diagnostic coronary arteriography.   DESCRIPTION OF PROCEDURE:  The patient was brought to the second floor [facility] cardiac catheterization lab in the postabsorptive  state.  He was premedicated with p.o. Valium.  His right groin was prepped  and draped in the usual sterile fashion.  Xylocaine 1% was used for local  anesthesia.  A 6 French sheath was inserted into the right femoral artery  using standard Seldinger technique.  The 6 French right and left Judkins  Silastic catheters (JL5 for the left and 5 [place] No Torque catheter were  used for selective coronary artery angiography, left ventriculography,  supravalvular aortography and distal abdominal aortography).  Isovue dye was  used for the entirety of the case.  _________ aortic, left ventricular, and  pullback pressures were recorded.  The patient also received 200 mcg of  intracoronary nitroglycerin because of proximal and mid RCA spasm.   HEMODYNAMICS:  Aortic systolic pressure 131, diastolic pressure 67.  Left  ventricular systolic pressure 132 and diastolic pressure 17.   SELECTIVE CORONARY ANGIOGRAPHY:  1.  Left main normal.  2.  LAD:  The proximal mid stent widely patent.  3.  Left  circumflex:  Widely patent.  4.  Right coronary artery:  Initially with intubation of the JR4, there was      ostial and mid spasm.  There were 200 mcg of intracoronary nitroglycerin      then administered and a 5 French No Torque catheter was then used to      further image the RCA.  The proximal spasm resolved, however, there was      a segmental 40 to 50% mid stenosis which did not appear to be      atherosclerotic, however, did not resolve.   SUPRAVALVULAR AORTOGRAPHY:  Supravalvular aortogram was performed using 20  mL of Visipaque dye, 20 mL per second.  The aortic root was moderately  dilated though there was no aortic insufficiency noted.   DISTAL ABDOMINAL AORTOGRAPHY:  Distal abdominal aortogram was performed  using 20 mL of Visipaque dye at 20 mL per seconds.  The renal arteries were  widely patent.  The infrarenal abdominal aorta, iliac bifurcation were free  of significant atherosclerotic changes.   IMPRESSION:  Mr. Egelston has widely patent proximal mid left  anterior  descending.  He has right coronary artery spasm.  Consideration was given to  IVUS interrogation of the mid right coronary artery, however,  angiographically, this did not appear to be atherosclerotic and ultimately  plans will be to discharge the patient home later today and obtain  outpatient Persantine Cardiolite stress test.   The sheaths were removed and pressure was held on the groin to achieve  hemostasis.  The patient left the lab in stable condition.       JB/MEDQ  D:  11/08/2003  T:  11/08/2003  Job:  [id number]   cc:   Second Floor [facility] Cardiac Cath Lab   [facility]  [address]  [place], [place] [address]   Gordy Savers, M.D. [facility]

## 2010-05-25 NOTE — Cardiovascular Report (Signed)
NAMEHERMENEGILDO, CLAUSEN               ACCOUNT NO.:  [id number]   MEDICAL RECORD NO.:  [id number]          PATIENT TYPE:  INP   LOCATION:  2037                         FACILITY:  [facility]   PHYSICIAN:  Madaline Savage, M.D.DATE OF BIRTH:  [birth year]   DATE OF PROCEDURE:  11/01/2005  DATE OF DISCHARGE:                              CARDIAC CATHETERIZATION   PROCEDURES PERFORMED:  1. Selective coronary angiography by Judkins technique.  2. Retrograde left heart catheterization.  3. Left ventricular angiography.   INTERVENTIONS:  None.   DYE USED:  Omnipaque 100 mL   PATIENT PROFILE:  The patient is a 77 year old gentleman who has had  previous coronary artery stenting by Dr. Loraine Leriche Pulsipher to the LAD and more  recently he had RCA stent placed to the right coronary artery by Dr.  Nanetta Batty.  He enters the hospital at this time because of increasing  episodes of recurrent chest pain that are anginal-like and respond to  nitroglycerin.  Today's procedure was performed electively with an intent to  conserve dye and no complications were noted.   RESULTS:   PRESSURES:  1. The left ventricular pressure was 125/13.  2. End-diastolic pressure 20.  3. Central aortic pressure 125/55, mean of 81.  4. No aortic valve gradient by pullback technique.   ANGIOGRAPHIC RESULTS:  1. I first visualized the right coronary artery.  It was a very large and      dominant vessel.  It gave rise to a large pulmonary conus branch, a      medium-size AV-nodal branch.  The RCA itself was about 3.5 to 3.75-mm      in diameter, proximal and mid.  Then it became a large long posterior      descending branch and a large trifurcating posterolateral branch and      one acute marginal branch.  All vessels appeared  widely patent.  There      was a radio-opaque stent in the downgoing limb of the mid coronary      artery.  The stent appeared pristine in appearance.  There was TIMI      flow throughout.  2. Left  main coronary artery was medium in size and fairly short.  No      lesions were seen.  3. LAD coursed the cardiac apex, giving rise to one major diagonal branch      arising well before first septal perforator.  No lesions were seen      throughout the LAD and its diagonal branches.  The mid-LAD just beyond      septal perforator branch #1 contained the first stent which was      overlapped with a second stent in the mid LAD and the blood flow was      pristine throughout the stent borders looked very good.  4. Circumflex coronary artery consisted of one large obtuse marginal which      bifurcated and a fairly small but long circumflex.  No lesions seen.   LEFT VENTRICULAR ANGIOGRAPHY:  Showed good contractility of all wall  segments.  Ejection fraction estimated  at 60%.  No mitral regurgitation  seen.   FINAL DIAGNOSES:  1. Angiographically patent coronary arteries with one patent stent in the      mid right coronary artery, and two overlapping patent stents in the mid-      left anterior descending artery.  No new lesions seen since the time of      the last catheterization.  2. Normal left ventricular systolic function.  3. Dye limited study because of creatinine elevations.   PLAN:  The patient will be recovered in the holding area and returned to  telemetry monitoring because of his PVCs.  I do recommend a GI workup for  this patient who seems to have a legitimate problem with chest pain it just  does not seem cardiac.           ______________________________  Madaline Savage, M.D.     WHG/MEDQ  D:  11/01/2005  T:  11/02/2005  Job:  [id number]   cc:   Nanetta Batty, M.D.  Cardiac Cath Lab, [facility]

## 2010-05-25 NOTE — Cardiovascular Report (Signed)
NAME:  [name] [name], [name] [name]                         ACCOUNT NO.:  [id number]   MEDICAL RECORD NO.:  [id number]                   PATIENT TYPE:  INP   LOCATION:  2016                                 FACILITY:  [facility]   PHYSICIAN:  Runell Gess, M.D.             DATE OF BIRTH:  [birth year]   DATE OF PROCEDURE:  DATE OF DISCHARGE:                              CARDIAC CATHETERIZATION   PROCEDURE PERFORMED:  Cardiac catheterization.   INDICATION:  The patient is [name] 77 year old married white male, previously [name]  patient of Dr. Loraine Leriche [name] at [facility].  He has [name] history of  LAD stenting, January 2002.  He was last catheterized January 13, 2001, and  found to have no evidence of in-stent re-stenosis or critical CAD. He  presented October 05, 2001, with exertional chest pain. His ECG was  nonischemic and his first set of enzymes are negative.  He subsequently  ruled out for myocardial infarction. He was placed on IV heparin and  presents now for diagnostic coronary arteriography.   DESCRIPTION OF PROCEDURE:  The patient was brought to the second floor [facility] in the postabsorptive state.  He was  premedication with p.o. Valium. His right groin was prepped and shaved in  the usual sterile fashion.  Then 1% Xylocaine was used for local anesthesia.  [name] 6 French sheath was inserted into the right femoral artery using standard  Seldinger technique. [name] 6 French right and left Judkins diagnostic catheter  as well as [name] 6 French pigtail catheter were used for selective coronary  angiography, left ventriculography, and supravalvular aortography in the LAO  cranial view and distal abdominal aortography.  Omnipaque dye was used for  the entirety of the case. Retrograde, aortic, left ventricular, and pullback  pressures were recorded.   HEMODYNAMICS:  1. Aortic systolic pressure 138, diastolic pressure 64.  2. Left ventricular systolic pressure 140,  end-diastolic pressure 17.   SELECTIVE CORONARY ANGIOGRAPHY:  1. Left main:  Normal.  2. Left anterior descending:  The LAD proximal obstructed segment was widely     patent. There was at most 20-30% in-stent re-stenosis.  The first     diagonal branch, which is [name] large vessel, had at most 40-50% proximal     segmental stenosis.  3. Left circumflex:  Free of significant disease.  4. Right coronary artery:  Dominant and free of significant disease.   LEFT VENTRICULOGRAPHY:  RAO left ventriculogram was performed using 25 cc of  Omnipaque dye at 12 cc/sec.  The overall LVEF was estimated at greater than  60% without focal wall motion abnormalities.   SUPRAVALVULAR AORTOGRAPHY:  Supravalvular aortogram was performed in the RAO  cranial view using 20 cc of Omnipaque dye at 20 cc/sec.  The aortic root  appeared dilated, however, there was no evidence of aortic insufficiency and  the arch vessels appeared intact.   DISTAL  ABDOMINAL AORTOGRAPHY:  Distal abdominal aortogram was performed  using  25 cc of Omnipaque dye at 20 cc/sec. The renal arteries were widely patent.  The inferior abdominal aorta and iliac bifurcation appear free of  significant atherosclerotic changes.   IMPRESSION:  The patient has new evidence of in-stent re-stenosis with  normal left ventricular function.  His aortic root does appear dilated and I  would recommend he obtain an outpatient chest CT for baseline measurements.   ACT was measured and the sheaths were removed. Pressure was held on the  groin to achieve hemostasis. The patient left the lab in stable condition.  He will be discharged home later today and will see me back in the office in  2-3 weeks for followup.                                                 Runell Gess, M.D.    JJB/MEDQ  D:  10/06/2001  T:  10/09/2001  Job:  [id number]   cc:   Cardiac Catheterization Laboratory   [facility]

## 2010-05-25 NOTE — Discharge Summary (Signed)
[name], [name]               ACCOUNT NO.:  [id number]   MEDICAL RECORD NO.:  [id number]          PATIENT TYPE:  INP   LOCATION:  3704                         FACILITY:  [facility]   PHYSICIAN:  Nanetta Batty, M.D.   DATE OF BIRTH:  Jun 04, 1932   DATE OF ADMISSION:  DATE OF DISCHARGE:  11/20/2003                                 DISCHARGE SUMMARY   DISCHARGE DIAGNOSES:  1.  Unstable angina.  2.  Right coronary artery Cypher stent this admission after intravascular      ultrasound.  3.  Previous history of left anterior descending artery stenting, January      2002.  4.  Transient hypertension.  5.  Transient hyperlipidemia.  6.  Non-insulin-dependent diabetes.  7.  Obesity.   HOSPITAL COURSE:  The patient is a 77 year old male followed by Dr. Allyson Sabal.  Patient had an LAD stent in January 2002 by Dr. Gerri Spore.  Recently, the  patient was studied November 08, 2003 for chest pain.  Catheterization  revealed a 50% RCA narrowing.  Previous LAD stent was patent. He had a  Cardiolite study as an outpatient on November 10, 2003 which did show some  mild lateral ischemia and some inferior ischemia.   Patient presented January 17, 2004 with complaints of chest pain to the  office.  The patient was admitted to telemetry, started on IV nitrates and  heparin, and set-up for diagnostic catheterization with IVUS.  His troponins  were negative.  He had a catheterization done January 18, 2004 by Dr. Allyson Sabal  with IVUS.  This did reveal a large plaque burden in the RCA.  Stenosis was  now about 80%.  He was dilated and stented with a Cypher stent.  He was put  on Integrilin for 18 hours and then aspirin and Plavix.  He was transferred  to the floor and ambulated.  We felt he could be discharged January 20, 2004.   DISCHARGE LABORATORY DATA:  White count 4.2, hemoglobin 11.9, hematocrit  34.5, platelets 215.  Sodium 135, potassium 4.4, BUN 15, creatinine 1.2.  CK-  MB and troponins negative x2.  INR  0.9.  Urinalysis unremarkable.  EKG from  the office showed sinus rhythm without acute changes.   DISCHARGE MEDICATIONS:  1.  Lipitor 40 mg a day.  2.  Coated aspirin daily.  3.  Metoprolol 25 mg a day.  4.  Omeprazole 20 mg a day.  5.  Lisinopril hydrochlorothiazide 20/12.5 b.i.d.  6.  Imdur 30 mg a day.  7.  Actos 15 mg a day.  8.  Plavix 75 mg a day.  9.  He will resume his Metformin 500 mg b.i.d. on January 21, 2004.   DISPOSITION:  The patient was discharged in stable condition and will follow-  up with Dr. Allyson Sabal as an outpatient.      LKK/MEDQ  D:  01/21/2004  T:  01/21/2004  Job:  [id number]

## 2010-05-25 NOTE — Discharge Summary (Signed)
[name]. [facility]  Patient:    [name], [name]                      MRN: [id number] Adm. Date:  [id number] Disc. Date: 01/11/00 Attending:  Alric Quan Dictator:   Abelino Derrick, P.A.C. [facility] CC:         Daisey Must, M.D. [facility]   Referring Physician Discharge [place]  DISCHARGE DIAGNOSES 1. Coronary disease, status post stenting to the left anterior descending, two    sites, this admission. 2. Non-insulin-dependent diabetes. 3. Hyperlipidemia.  HOSPITAL COURSE:  Patient is a 77 year old male followed by Dr. Corwin Levins, who was admitted January 09, 2000 with chest pain consistent with unstable angina.  EKG had nonspecific changes.  He was put on aspirin, Toprol, Lovenox and nitrates and MI was ruled out.  He underwent cardiac catheterization, January 10, 2000, by Dr. Cecil Cranker, which revealed normal circumflex and normal RCA and 90-95% LAD.  He had normal LV function.  He underwent percutaneous intervention by Dr. Loraine Leriche Pulsipher, with two stents placed to the LAD, good final results.  He was put on Integrilin for 18 hours post procedure.  We feel he is stable for discharge, January 11, 2000.  He will be seen in the office in about two weeks.  We did add Altace and Lipitor to his medicines.  He needs a BMP in two weeks when he comes to the office and LFTs and fasting lipid in six weeks.  LABORATORY AND X-RAY FINDINGS:  White count 6.8, hemoglobin 13.4, hematocrit 37.8, platelets 238,000.  INR is 1.0.  Sodium 136, potassium 4.7, BUN 17, creatinine 1.1.  Hemoglobin A1c was 7.6.  CK-MB and troponins are negative x 3.  Lipid profile shows a cholesterol of 195, triglycerides 209, HDL 37, LDL 116.  Chest x-ray shows no active disease.  EKG shows sinus rhythm, sinus bradycardia at 58, nonspecific changes.  DISPOSITION:  Patient is discharged in stable condition and will follow up in the office in about two weeks. DD:  01/11/00 TD:   01/11/00 Job: [id number]

## 2010-05-25 NOTE — Cardiovascular Report (Signed)
[place]. [facility]  Patient:    [name], [name] Visit Number: [id number] MRN: [id number]          Service Type: CAT Location: 3700 3710 [id number] Attending Physician:  Daisey Must Dictated by:   Daisey Must, M.D. [facility] Proc. Date: 10/14/00 Admit Date:  10/14/2000   CC:         Corwin Levins, M.D. [facility]  Cardiac Catheterization Lab   Cardiac Catheterization  PROCEDURE: 1. Left heart catheterization with coronary angiography and left    ventriculography. 2. Percutaneous transluminal coronary angioplasty utilizing the cutting    balloon of the mid left anterior descending artery.  CARDIOLOGIST:  Daisey Must, M.D. [facility]  INDICATION:  [name] is a 77 year old male with a history of stent placement x 2 to the mid LAD in January of this year.  He recently has had progressive exertional angina.  A stress Cardiolite showed evidence of anterior ischemia.  We, therefore, opted to proceed with cardiac catheterization.  CATHETERIZATION PROCEDURAL NOTE:  A 6-French sheath was placed in the right femoral artery.  Catheters utilized included a 6-French JL5, a JR4, and angled pigtail.   Contrast was Omnipaque.  There were no complications.  CATHETERIZATION RESULTS:  HEMODYNAMICS: Left ventricular pressure 150/25, aortic pressure 150/74.  There was no aortic valve gradient.  LEFT VENTRICULOGRAM:  Wall motion is normal.  Ejection fraction is calculated at 67%.  There is 1+ mild mitral regurgitation.  CORONARY ANGIOGRAPHY: (Right dominant).  Left main has a distal 20% stenosis.  Left anterior descending artery has a proximal 40% stenosis.  In the mid LAD are two stents with minimal overlap of the two stents.  In the proximal portion of the first stent is a tubular 40% stenosis.  In the mid portion of the stents is a discrete 95% stenosis.  There is significant curvature of the artery within the stented portion of the vessel.  Just beyond the distal  stent is a 30% stenosis further down in the mid LAD.  The apical LAD has a 70% stenosis.  The LAD gives rise to a normal size first diagonal which has a 50% stenosis.  There are small second and third diagonal branches.  The left circumflex has a 30% stenosis proximally.  There is a large first obtuse marginal which has a 30% stenosis proximally.  This is a large bifurcating vessel.  The second obtuse marginal is small  The right coronary artery is a dominant vessel.  There is a 20% stenosis proximally and a 40% stenosis in the mid vessel.  In the distal A-V groove portion of the right coronary artery is a 20% stenosis.  The distal right coronary artery gives rise to a normal size posterior descending artery and three small posterolateral branches.  IMPRESSIONS: 1. Normal left ventricular systolic function with mild mitral regurgitation. 2. One-vessel coronary artery disease as described with significant discrete    in-stent restenosis in the mid left anterior descending artery.  PLAN:  Percutaneous intervention of the mid LAD.  See below.  PTCA PROCEDURAL NOTE:  Following completion of the diagnostic catheterization, we opted to proceed with percutaneous coronary intervention.  Because of the discrete nature of the restenosis, it was opted to treat this with balloon angioplasty without the use of additional brachytherapy.  There was also some concern because of the tortuosity of the vessel that the radiation delivery catheter would not cross through the stents very easily.  The preexisting 6-French sheath in the right femoral  artery was exchanged over wire for a 7-French.  Heparin and Integrilin were administered per protocol.  We used a 7-French Voda left #4 guiding catheter and a BMW wire.  A 2.75 x 10 mm cutting balloon was successfully advanced through the stents and positioned across the 95% stenosis in the mid portion of the stents.  This balloon was inflated sequentially to  6, 8, and then 9 atmospheres.  The balloon was then pulled back to cover the proximal portion of stent and inflated for two inflations, each at 10 atmospheres.  Final angiographic images revealed patency of the LAD with less than 20% residual stenosis within the stented portion and TIMI-3 flow into the distal vessel.  COMPLICATIONS:  None.  RESULTS:  Successful PTCA utilizing a cutting balloon of the mid LAD.  A 40% followed by a discrete 95% in-stent restenosis were both reduced to less than 20% residual with TIMI-3 flow.  PLAN:  The patient will be treated with Integrilin for 18 hours.  Plavix will be administered for an additional four weeks.  We will also administer Foltex for the next 6 to 9 months for potential benefits in reducing restenosis. Dictated by:   Daisey Must, M.D. [facility] Attending Physician:  Daisey Must DD:  10/14/00 TD:  10/14/00 Job: [id number]

## 2010-05-25 NOTE — Discharge Summary (Signed)
[place]. [facility]  Patient:    [name], [name] Visit Number: [id number] MRN: [id number]          Service Type: CAT Location: 3700 3710 01 Attending Physician:  Daisey Must Dictated by:   Pennelope Bracken, N.P. Admit Date:  10/14/2000 Discharge Date: 10/15/2000                             Discharge Summary  DISCHARGE DIAGNOSES: 1. Coronary artery disease, status post placement of stents in the mid and    proximal left anterior descending January 2002 with catheterization    March 2002 findings of moderate left anterior descending disease, abnormal    Cardiolite September 15, 2000 showing ischemia to the distal left anterior    descending, with findings this admission on catheterization of 95% in-stent    restenosis of the left anterior descending. 2. Hyperlipidemia. 3. Diabetes mellitus. 4. Gastroesophageal reflux disease. 5. Degenerative joint disease of the hands.  HOSPITAL COURSE:  This delightful 77 year old white male was seen in the office on September 9 for evaluation of exertional chest tightness. Cardiolite study was performed which revealed worse perfusion in the distal anterior wall.  Resting Cardiolite was normal, as was stress EKG.  Two weeks after this test was performed, the patient was again evaluated in the office, where it was discovered that he was continuing to have chest symptoms.  He was, at this point, scheduled for cardiac catheterization.  The patient was admitted, placed on telemetry, and prepared for the study. Baseline labs were as follows:  CK total 117 day of admission, CK-MB 1.9 with a relative index of 1.6.  CBC revealed WBC to be 4.3, RBC low at 4.01, hemoglobin low at 11.4, and hematocrit low at 33.2.  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  A chest x-ray performed at time of admission revealed moderate cardiomegaly and no evidence of active infiltrate or  congestive failure.  The patient was taken to the cardiac catheterization lab where a left heart catheterization with angiography was performed.  A 6-French sheath was placed and findings were as follows:  Left ventriculogram revealed a normal wall motion with an EF of 67%.  The left main revealed a distal 20% stenosis.  The LAD had a proximal 40% stenosis.  The two stents were visualized in the mid LAD with minimal overlap.  The first stent had a tubular 40% stenosis in the proximal portion, with a discrete 95% stenosis in the middle of the stent. The apical LAD was 70% stenosed.  The left circumflex had moderate disease. The right coronary artery had a 40% mid vessel stenosis.  It was elected to perform PTCA with balloon angioplasty.  Due to tortuosity of the vessel, it was elected not to perform brachytherapy.  Multiple balloon inflations increased the patency of the LAD to a less than 20% of residual stenotic without complications.  The patient was treated with Integrilin for 18 hours and his recovery was uneventful.  DISCHARGE MEDICATIONS: 1. Plavix 75 mg 1 q.d. for four weeks. 2. Aspirin 325 one q.d. 3. Toprol XL 25 mg 1 q.d. 4. Lipitor 10 mg 1 q.d. 5. Altace 10 mg 1 q.d. 6. FOLTX 1 q.d. for 6-9 months. 7. Vioxx 25 mg 1 q.d. 8. Prilosec 20 mg 1 q.d. 9. The patient will resume his Glucophage tomorrow as per protocol.  CONDITION ON DISCHARGE:  On day  of discharge, the patient was in stable condition.  PHYSICAL EXAMINATION:  GENERAL:  He feels well, reporting no chest pain or dyspnea.  VITAL SIGNS:  136/74, pulse 64, respirations 20, afebrile.  CHEST:  Clear to auscultation.  HEART:  Regular rate and rhythm without murmur, rub, or gallop with clear S1, S2.  LABORATORY DATA:  WBC 5.7, hemoglobin 11.6, hematocrit 33.3, glucose 202, sodium 137, potassium 3.9, chloride 105, CO2 26, BUN 14, creatinine 1.1.  CK was normal at 117, CK-MB found to be 1.9 with a relative index of  1.6.  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  DISCHARGE INSTRUCTIONS:  The patient was instructed not to participate in strenuous activity for two days.  He is advised to follow a low-fat/low-salt/low-cholesterol diet.  He agrees to call if his groin wound becomes hard or painful.  Though he has been seen by cardiac rehab, he does decline the program at this point but agrees to increase his exercise to 30 minutes a day x 5 days per week.  He is scheduled October 22, 2000 at 67 oclock with a P.A. for a groin check and at November 4 at 10:15 with Dr. Gerri Spore.  He states an understanding of the necessity to represent to the ED with any change or increase in symptoms and knows to call us at any time with any questions, problems, or concerns. Dictated by:   Pennelope Bracken, N.P. Attending Physician:  Daisey Must DD:  10/15/00 TD:  10/15/00 Job: [id number]

## 2010-05-25 NOTE — Cardiovascular Report (Signed)
[place]. [facility]  Patient:    [name], [name]                      MRN: [id number] Proc. Date: 01/10/00 Adm. Date:  [id number] Attending:  Corwin Levins CC:         Rudene Christians. Ladona Ridgel, M.D. [facility]  Corwin Levins, M.D. [facility]  Cath Lab   Cardiac Catheterization  PROCEDURE: 1. PTCA with stent placement in the mid left anterior descending artery. 2. Stent placement in the proximal left anterior descending artery.  INDICATIONS:  [name] is a 77 year old male who was admitted with unstable angina.  Cardiac catheterization done today by Dr. Corinda Gubler revealed the presence of a 95% stenosis in the mid LAD on a moderately angulated section of vessel.  There was a 75% stenosis in the proximal LAD just after a septal perforator.  We opted to proceed with percutaneous intervention.  DESCRIPTION OF PROCEDURE:  The preexisting 6 French sheath in the right femoral artery was exchanged over a wire for a 7 [place] sheath.  We used a 7 [place] JL5 guiding catheter.  Heparin and Integrilin were administered per protocol.  A BMW wire was advanced under fluoroscopic guidance under the distal LAD.  The lesion in the mid-LAD was initially dilated with a 2.5 x 15 mm Maverick balloon inflated to 8 atmospheres.  We then deployed a 2.5 x 18 mm Pixel stent across the lesion at a deployment pressure of 12 atmospheres.  We then postdilated this with a 2.75 x 12 mm Quantum balloon.  This balloon could only be advanced into the proximal 1/3 of the stent because of the bend on which the stent was placed.  This balloon was inflated to 14 atmospheres. We then advanced a 2.5 x 15 mm Maverick balloon which we were able to advance completely through the stent and position this within the margins of the stent and inflated it to 14 atmospheres.  Following this, the disease in the proximal LAD just proximal to where this first stent had been deployed appeared to be slightly more prominent, we  therefore placed a 2.75 x 13 mm Penta stent across this lesion with minimal overlap of the initial stent. This stent was deployed at 12 atmospheres.  Final angiographic images revealed patency of the LAD with 0% residual stenosis at both sites and [place] 3 flow.  COMPLICATIONS:  None.  RESULTS:  Successful PTCA with stent placement x 2 in the proximal to mid ladx artery, a 95% stenosis in the mid LAD was reduced to 0% residual, and a 75% stenosis in the proximal LAD was reduced to 0% residual.  PLAN:  Integrilin will be continued for 18 hours.  Plavix will be administered for four weeks. DD:  01/10/00 TD:  01/10/00 Job: [id number]

## 2010-05-25 NOTE — Discharge Summary (Signed)
NAMESEVASTIAN, [name]               ACCOUNT NO.:  [id number]   MEDICAL RECORD NO.:  [id number]          PATIENT TYPE:  INP   LOCATION:  2037                         FACILITY:  [facility]   PHYSICIAN:  Nanetta Batty, M.D.   DATE OF BIRTH:  1932-01-26   DATE OF ADMISSION:  DATE OF DISCHARGE:  11/02/2005                               DISCHARGE SUMMARY   DISCHARGE DIAGNOSES:  1. Unstable angina, negative myocardial infarction.  Cardiac cath with      patent stents.  Noncardiac chest pain.  2. Coronary disease with history of percutaneous coronary intervention      to the left anterior descending in 2002 and stents to the right      coronary artery in January of '06.  3. Diabetes mellitus, type 2.  4. Hypertension.  5. Hyperlipidemia.  6. Premature ventricular contractions.  7. Renal insufficiency.  8. Discharge condition:  Improved.  9. Gastrointestinal consult.  10.Reflux disease with increase of proton pump inhibitor.   DISCHARGE MEDICATIONS:  1. Increase Prevacid to twice daily 20-30 minutes prior to breakfast      and dinner.  2. Toprol-XL 25 mg daily.  3. Metformin 1000 mg twice daily.  Do not start until Monday.  4. Plavix 75 mg daily.  5. Stop Imdur.  6. Lipitor 40 mg daily.  7. Actos 15 mg daily.  8. Aspirin 325 mg daily.  9. Lisinopril/HCTZ daily.  10.Amaryl 2 mg daily.  11.Nitroglycerin as needed for chest pain under your tongue.   Follow up with Dr. Arlyce Dice.  His office will call you for a followup  appointment in 3-4 weeks.   DISCHARGE INSTRUCTIONS:  1. Otherwise, a low-fat, low-salt diabetic diet.  2. Increase activity slowly.  May shower, bathe.  No driving for 1      day.  No lifting for 1 day.  3. Wash right groin cath site with soap and water.  Call if any      bleeding, swelling, or drainage.  4. Follow up with Dr. Elsie Lincoln in 2 weeks.  Our office will call with      date and time.  5. Have lab work done on Monday to check his kidney function.   HISTORY OF  PRESENT ILLNESS:  A 77 year old male with a history of  coronary artery disease and a history of stent to the LAD in January of  '02.  A cath in '05 revealed a 50% RCA stenosis.  Last outpatient  Cardiolite was November of '05, revealed some ischemia, and a repeat  cardiac cath with IVUS in January of '06, and CYPHER was placed to the  right coronary artery.  He had done well since January of '06.  Two  weeks prior to admission, he had chest pain, went home from work.  On  the 24th of October, it recurred, and took nitro with relief.  On the  date of admission, October 31, 2005, he had chest pain, a shock pain,  and followed by an ache in his chest.  Symptoms were not relieved with  nitroglycerin x2, so he went to the emergency room.  He had no nausea,  vomiting, or diaphoresis.  He says symptoms are similar to his pain  prior to his angioplasty to the right coronary.  He was admitted to [facility]  and placed on heparin and IV nitro with plans for a cardiac cath.   OUTPATIENT MEDICATIONS:  1. Toprol 25.  2. Metformin a gram b.i.d.  3. Plavix 74 daily.  4. Prevacid daily.  5. Imdur 30.  6. Lipitor 40.  7. Actos 15.  8. Lisinopril/HCT 20/12.5 b.i.d.  9. Amaryl 2 mg daily.   ALLERGIES:  No known allergies.   FAMILY HISTORY, SOCIAL HISTORY, REVIEW OF SYSTEMS:  See H&P.   PHYSICAL EXAMINATION AT DISCHARGE:  Blood pressure 134/73, pulse 61,  respiratory 20, afebrile, room air oxygen saturation 94%.  Lungs were clear.  Abdomen is obese, soft, nontender.  Positive bowel sounds.  EXTREMITIES:  Without edema.  HEART:  Regular rate and rhythm.   LABORATORY DATA:  Hemoglobin 14, hematocrit 42.3, WBC 8.1, platelets  289, neutrophils 66, lymphs 21, monos 10, eos 2, and bands at 1.  At  discharge, the mono was 17.  Pro time 13.4, INR of 1, PTT 29.  Heparin  level was up on heparin drip.  Chemistry:  Sodium 136, potassium 4.3,  chloride 102, CO2 25, glucose 133, BUN 20, creatinine 1.4, calcium  9.6.  Total protein 6.9, albumin 2.7, AST 21, ALT 15, alkaline phos 71, total  bili 0.7.  Magnesium 2.3.  These remained stable.  Cardiac enzymes:  CK  134, 91, and 92.  MB is 3.1 to 2.0, all negative.  Troponin I negative  at less than 0.01.  Cholesterol 137, triglycerides 185, HDL 29, LDL 71.  TSH 2.96.  UA showed a small amount of bilirubin, ketones 15, protein  30.   EKG:  Sinus rhythm with PVCs.  On his chest x-ray, no active disease.   HOSPITAL COURSE:  Mr. Mccree was admitted October 31, 2005, by Dr.  Jacinto Halim on call for Dr. Allyson Sabal with unstable angina and was placed on IV  heparin and IV nitroglycerin.  On November 01, 2005, he was stable with  negative MI.  No chest pain, and labs were stable.  He did have some  junctional rhythm with inverted T waves with no change in the heart rate  with that.  We held his Glucophage, and then he underwent cardiac cath  on November 01, 2005, with patent stents and EF of 60%.  Dr. Elsie Lincoln felt  this was more related to GI, so he had a consult with [facility], Dr.  Christella Hartigan.  Patient has a history of esophagitis and takes daily Prevacid.  The Prevacid was increased  to twice a day, and there was also a musculoskeletal component of his  pain.  He was going to follow up with Dr. Arlyce Dice, his primary GI  physician in 3-4 weeks to see if increasing the Prevacid would help.  He  was discharged home on November 02, 2005, after evaluation by Dr.  Domingo Sep, and he will follow up as an outpatient.      [name]. Annie Paras, N.P.      Nanetta Batty, M.D.  Electronically Signed    LRI/MEDQ  D:  12/13/2005  T:  12/14/2005  Job:  [id number]   cc:   Rachael Fee, MD

## 2010-06-18 HISTORY — PX: DOPPLER ECHOCARDIOGRAPHY: SHX263

## 2010-06-26 ENCOUNTER — Encounter: Payer: Self-pay | Admitting: Internal Medicine

## 2011-11-26 ENCOUNTER — Ambulatory Visit: Payer: Medicare Other | Attending: Physical Medicine and Rehabilitation

## 2011-11-26 DIAGNOSIS — M545 Low back pain, unspecified: Secondary | ICD-10-CM | POA: Insufficient documentation

## 2011-11-26 DIAGNOSIS — R293 Abnormal posture: Secondary | ICD-10-CM | POA: Insufficient documentation

## 2011-11-26 DIAGNOSIS — IMO0001 Reserved for inherently not codable concepts without codable children: Secondary | ICD-10-CM | POA: Insufficient documentation

## 2011-11-26 DIAGNOSIS — R262 Difficulty in walking, not elsewhere classified: Secondary | ICD-10-CM | POA: Insufficient documentation

## 2011-11-28 ENCOUNTER — Ambulatory Visit: Payer: Medicare Other | Admitting: Rehabilitation

## 2011-12-02 ENCOUNTER — Ambulatory Visit: Payer: Medicare Other | Admitting: Physical Therapy

## 2011-12-10 ENCOUNTER — Ambulatory Visit: Payer: Medicare Other | Attending: Physical Medicine and Rehabilitation | Admitting: Rehabilitation

## 2011-12-10 DIAGNOSIS — R293 Abnormal posture: Secondary | ICD-10-CM | POA: Insufficient documentation

## 2011-12-10 DIAGNOSIS — R262 Difficulty in walking, not elsewhere classified: Secondary | ICD-10-CM | POA: Insufficient documentation

## 2011-12-10 DIAGNOSIS — IMO0001 Reserved for inherently not codable concepts without codable children: Secondary | ICD-10-CM | POA: Insufficient documentation

## 2011-12-10 DIAGNOSIS — M545 Low back pain, unspecified: Secondary | ICD-10-CM | POA: Insufficient documentation

## 2011-12-12 ENCOUNTER — Ambulatory Visit: Payer: Medicare Other | Admitting: Rehabilitation

## 2011-12-12 ENCOUNTER — Encounter: Payer: Medicare Other | Admitting: Rehabilitation

## 2011-12-16 ENCOUNTER — Ambulatory Visit: Payer: Medicare Other

## 2011-12-19 ENCOUNTER — Ambulatory Visit: Payer: Medicare Other

## 2011-12-23 ENCOUNTER — Ambulatory Visit: Payer: Medicare Other

## 2011-12-26 ENCOUNTER — Ambulatory Visit: Payer: Medicare Other | Admitting: Rehabilitation

## 2012-01-07 ENCOUNTER — Ambulatory Visit: Payer: Medicare Other

## 2012-05-22 ENCOUNTER — Telehealth: Payer: Self-pay | Admitting: Cardiovascular Disease

## 2012-05-22 NOTE — Telephone Encounter (Signed)
[name] was [name] presurgical called requesting the most recent EKG be faxed to [phone]. ST 05/22/12.

## 2012-06-17 ENCOUNTER — Emergency Department (HOSPITAL_COMMUNITY): Payer: Medicare Other

## 2012-06-17 ENCOUNTER — Encounter (HOSPITAL_COMMUNITY): Payer: Self-pay | Admitting: Emergency Medicine

## 2012-06-17 ENCOUNTER — Ambulatory Visit: Payer: Medicare Other | Admitting: Cardiology

## 2012-06-17 ENCOUNTER — Inpatient Hospital Stay (HOSPITAL_COMMUNITY)
Admission: EM | Admit: 2012-06-17 | Discharge: 2012-06-19 | DRG: 682 | Disposition: A | Discharge status: Home / Self Care | Payer: Medicare Other | Attending: Internal Medicine | Admitting: Internal Medicine

## 2012-06-17 DIAGNOSIS — N179 Acute kidney failure, unspecified: Secondary | ICD-10-CM | POA: Diagnosis present

## 2012-06-17 DIAGNOSIS — I2510 Atherosclerotic heart disease of native coronary artery without angina pectoris: Secondary | ICD-10-CM

## 2012-06-17 DIAGNOSIS — R42 Dizziness and giddiness: Secondary | ICD-10-CM | POA: Diagnosis present

## 2012-06-17 DIAGNOSIS — M545 Low back pain, unspecified: Secondary | ICD-10-CM

## 2012-06-17 DIAGNOSIS — I951 Orthostatic hypotension: Secondary | ICD-10-CM

## 2012-06-17 DIAGNOSIS — R269 Unspecified abnormalities of gait and mobility: Secondary | ICD-10-CM | POA: Diagnosis present

## 2012-06-17 DIAGNOSIS — R2681 Unsteadiness on feet: Secondary | ICD-10-CM

## 2012-06-17 DIAGNOSIS — D72829 Elevated white blood cell count, unspecified: Secondary | ICD-10-CM | POA: Diagnosis present

## 2012-06-17 DIAGNOSIS — Z6827 Body mass index (BMI) 27.0-27.9, adult: Secondary | ICD-10-CM

## 2012-06-17 DIAGNOSIS — Z7982 Long term (current) use of aspirin: Secondary | ICD-10-CM

## 2012-06-17 DIAGNOSIS — G459 Transient cerebral ischemic attack, unspecified: Secondary | ICD-10-CM

## 2012-06-17 DIAGNOSIS — D509 Iron deficiency anemia, unspecified: Secondary | ICD-10-CM | POA: Diagnosis present

## 2012-06-17 DIAGNOSIS — E43 Unspecified severe protein-calorie malnutrition: Secondary | ICD-10-CM | POA: Insufficient documentation

## 2012-06-17 DIAGNOSIS — D75839 Thrombocytosis, unspecified: Secondary | ICD-10-CM

## 2012-06-17 DIAGNOSIS — E785 Hyperlipidemia, unspecified: Secondary | ICD-10-CM

## 2012-06-17 DIAGNOSIS — E119 Type 2 diabetes mellitus without complications: Secondary | ICD-10-CM | POA: Diagnosis present

## 2012-06-17 DIAGNOSIS — D72819 Decreased white blood cell count, unspecified: Secondary | ICD-10-CM | POA: Diagnosis present

## 2012-06-17 DIAGNOSIS — Z87891 Personal history of nicotine dependence: Secondary | ICD-10-CM

## 2012-06-17 DIAGNOSIS — B029 Zoster without complications: Secondary | ICD-10-CM | POA: Diagnosis present

## 2012-06-17 DIAGNOSIS — Z79899 Other long term (current) drug therapy: Secondary | ICD-10-CM

## 2012-06-17 DIAGNOSIS — J069 Acute upper respiratory infection, unspecified: Secondary | ICD-10-CM

## 2012-06-17 DIAGNOSIS — I10 Essential (primary) hypertension: Secondary | ICD-10-CM | POA: Diagnosis present

## 2012-06-17 DIAGNOSIS — E039 Hypothyroidism, unspecified: Secondary | ICD-10-CM | POA: Diagnosis present

## 2012-06-17 DIAGNOSIS — K219 Gastro-esophageal reflux disease without esophagitis: Secondary | ICD-10-CM

## 2012-06-17 DIAGNOSIS — D473 Essential (hemorrhagic) thrombocythemia: Secondary | ICD-10-CM | POA: Diagnosis present

## 2012-06-17 DIAGNOSIS — E0781 Sick-euthyroid syndrome: Secondary | ICD-10-CM | POA: Diagnosis present

## 2012-06-17 HISTORY — DX: Type 2 diabetes mellitus without complications: E11.9

## 2012-06-17 LAB — CBC WITH DIFFERENTIAL/PLATELET
Basophils Absolute: 0.2 10*3/uL — ABNORMAL HIGH (ref 0.0–0.1)
Basophils Relative: 1 % (ref 0–1)
HCT: 41 % (ref 39.0–52.0)
Hemoglobin: 12.6 g/dL — ABNORMAL LOW (ref 13.0–17.0)
Lymphocytes Relative: 12 % (ref 12–46)
Monocytes Relative: 6 % (ref 3–12)
Neutro Abs: 13.3 10*3/uL — ABNORMAL HIGH (ref 1.7–7.7)
Neutrophils Relative %: 79 % — ABNORMAL HIGH (ref 43–77)
WBC: 16.8 10*3/uL — ABNORMAL HIGH (ref 4.0–10.5)

## 2012-06-17 LAB — COMPREHENSIVE METABOLIC PANEL
AST: 19 U/L (ref 0–37)
Alkaline Phosphatase: 46 U/L (ref 39–117)
BUN: 34 mg/dL — ABNORMAL HIGH (ref 6–23)
CO2: 17 mEq/L — ABNORMAL LOW (ref 19–32)
Chloride: 99 mEq/L (ref 96–112)
Creatinine, Ser: 1.9 mg/dL — ABNORMAL HIGH (ref 0.50–1.35)
GFR calc non Af Amer: 32 mL/min — ABNORMAL LOW (ref >90)
Potassium: 4.8 mEq/L (ref 3.5–5.1)
Total Bilirubin: 0.2 mg/dL — ABNORMAL LOW (ref 0.3–1.2)

## 2012-06-17 LAB — GLUCOSE, CAPILLARY

## 2012-06-17 LAB — URINALYSIS, ROUTINE W REFLEX MICROSCOPIC
Bilirubin Urine: NEGATIVE
Glucose, UA: NEGATIVE mg/dL
Ketones, ur: NEGATIVE mg/dL
Leukocytes, UA: NEGATIVE
Protein, ur: NEGATIVE mg/dL
pH: 5 (ref 5.0–8.0)

## 2012-06-17 MED ORDER — SIMVASTATIN 40 MG PO TABS
40.0000 mg | ORAL_TABLET | Freq: Every evening | ORAL | Status: DC
  Administered 2012-06-17 – 2012-06-18 (×2): 40 mg via ORAL
  Filled 2012-06-17 (×3): qty 1

## 2012-06-17 MED ORDER — ASPIRIN 325 MG PO TABS
325.0000 mg | ORAL_TABLET | Freq: Every day | ORAL | Status: DC
  Administered 2012-06-18 – 2012-06-19 (×2): 325 mg via ORAL
  Filled 2012-06-17 (×2): qty 1

## 2012-06-17 MED ORDER — OXYCODONE-ACETAMINOPHEN 5-325 MG PO TABS
1.0000 | ORAL_TABLET | Freq: Four times a day (QID) | ORAL | Status: DC | PRN
  Administered 2012-06-17: 1 via ORAL
  Administered 2012-06-18 – 2012-06-19 (×4): 2 via ORAL
  Filled 2012-06-17 (×3): qty 2
  Filled 2012-06-17: qty 1
  Filled 2012-06-17: qty 2

## 2012-06-17 MED ORDER — PANTOPRAZOLE SODIUM 40 MG PO TBEC
40.0000 mg | DELAYED_RELEASE_TABLET | Freq: Every day | ORAL | Status: DC
  Administered 2012-06-18 – 2012-06-19 (×2): 40 mg via ORAL
  Filled 2012-06-17 (×2): qty 1

## 2012-06-17 MED ORDER — VITAMIN B-12 500 MCG PO TABS: 500.0000 ug | ORAL_TABLET | Freq: Two times a day (BID) | ORAL | Status: DC

## 2012-06-17 MED ORDER — GLIMEPIRIDE 4 MG PO TABS
4.0000 mg | ORAL_TABLET | Freq: Every day | ORAL | Status: DC
  Filled 2012-06-17: qty 1

## 2012-06-17 MED ORDER — CYANOCOBALAMIN 500 MCG PO TABS
500.0000 ug | ORAL_TABLET | Freq: Two times a day (BID) | ORAL | Status: DC
  Administered 2012-06-17 – 2012-06-19 (×4): 500 ug via ORAL
  Filled 2012-06-17 (×5): qty 1

## 2012-06-17 MED ORDER — FA-PYRIDOXINE-CYANOCOBALAMIN 2.5-25-2 MG PO TABS
1.0000 | ORAL_TABLET | Freq: Every day | ORAL | Status: DC
  Administered 2012-06-18 – 2012-06-19 (×2): 1 via ORAL
  Filled 2012-06-17 (×2): qty 1

## 2012-06-17 MED ORDER — ZOLPIDEM TARTRATE 5 MG PO TABS
5.0000 mg | ORAL_TABLET | Freq: Once | ORAL | Status: AC
  Administered 2012-06-17: 5 mg via ORAL
  Filled 2012-06-17: qty 1

## 2012-06-17 MED ORDER — INSULIN ASPART 100 UNIT/ML ~~LOC~~ SOLN
0.0000 [IU] | Freq: Three times a day (TID) | SUBCUTANEOUS | Status: DC
  Administered 2012-06-18 – 2012-06-19 (×3): 3 [IU] via SUBCUTANEOUS
  Administered 2012-06-19: 5 [IU] via SUBCUTANEOUS

## 2012-06-17 MED ORDER — ACYCLOVIR SODIUM 50 MG/ML IV SOLN
900.0000 mg | Freq: Two times a day (BID) | INTRAVENOUS | Status: DC
  Administered 2012-06-18: 900 mg via INTRAVENOUS
  Filled 2012-06-17 (×2): qty 18

## 2012-06-17 MED ORDER — HYDROCHLOROTHIAZIDE 12.5 MG PO CAPS
12.5000 mg | ORAL_CAPSULE | Freq: Every day | ORAL | Status: DC
  Filled 2012-06-17: qty 1

## 2012-06-17 MED ORDER — OMEGA-3-ACID ETHYL ESTERS 1 G PO CAPS
1.0000 g | ORAL_CAPSULE | Freq: Two times a day (BID) | ORAL | Status: DC
  Administered 2012-06-17 – 2012-06-19 (×4): 1 g via ORAL
  Filled 2012-06-17 (×5): qty 1

## 2012-06-17 MED ORDER — ONDANSETRON HCL 4 MG/2ML IJ SOLN: 4.0000 mg | Freq: Three times a day (TID) | INTRAMUSCULAR | Status: DC | PRN

## 2012-06-17 MED ORDER — METOPROLOL TARTRATE 50 MG PO TABS
50.0000 mg | ORAL_TABLET | Freq: Every day | ORAL | Status: DC
  Filled 2012-06-17: qty 1

## 2012-06-17 MED ORDER — L-METHYLFOLATE-B6-B12 3-35-2 MG PO TABS: 1.0000 | ORAL_TABLET | Freq: Two times a day (BID) | ORAL | Status: DC

## 2012-06-17 MED ORDER — INSULIN ASPART 100 UNIT/ML ~~LOC~~ SOLN
0.0000 [IU] | Freq: Every day | SUBCUTANEOUS | Status: DC
  Administered 2012-06-18: 2 [IU] via SUBCUTANEOUS

## 2012-06-17 MED ORDER — LYSINE 500 MG PO TABS: 1.0000 | ORAL_TABLET | Freq: Three times a day (TID) | ORAL | Status: DC

## 2012-06-17 MED ORDER — GABAPENTIN 100 MG PO CAPS
100.0000 mg | ORAL_CAPSULE | Freq: Three times a day (TID) | ORAL | Status: DC
  Administered 2012-06-17: 100 mg via ORAL
  Filled 2012-06-17 (×4): qty 1

## 2012-06-17 MED ORDER — SODIUM CHLORIDE 0.9 % IV SOLN
INTRAVENOUS | Status: AC
  Administered 2012-06-17: 16:00:00 via INTRAVENOUS

## 2012-06-17 MED ORDER — DEXTROSE 5 % IV SOLN
900.0000 mg | Freq: Once | INTRAVENOUS | Status: AC
  Administered 2012-06-17: 900 mg via INTRAVENOUS
  Filled 2012-06-17: qty 18

## 2012-06-17 NOTE — ED Notes (Signed)
Unsuccessful IV attempt x 2.  Asked J. [name], RN to attempt.

## 2012-06-17 NOTE — Progress Notes (Signed)
ANTIBIOTIC CONSULT NOTE - INITIAL  Pharmacy Consult for acyclovir Indication: shingles  No Known Allergies  Patient Measurements:   Adjusted Body Weight:   Vital Signs: Temp: 98.5 F (36.9 C) (06/11 1200) Temp src: Oral (06/11 1200) BP: 111/62 mmHg (06/11 1530) Pulse Rate: 74 (06/11 1530) Intake/Output from previous day:   Intake/Output from this shift:    Labs:  Recent Labs  06/17/12 1318  WBC 16.8*  HGB 12.6*  PLT 966*  CREATININE 1.90*   CrCl is unknown because there is no height on file for the current visit. No results found for this basename: VANCOTROUGH, VANCOPEAK, VANCORANDOM, GENTTROUGH, GENTPEAK, GENTRANDOM, TOBRATROUGH, TOBRAPEAK, TOBRARND, AMIKACINPEAK, AMIKACINTROU, AMIKACIN,  in the last 72 hours   Microbiology: No results found for this or any previous visit (from the past 720 hour(s)).  Medical History: Past Medical History  Diagnosis Date  . Diabetes mellitus without complication     Assessment: 77 YOM admitted with dizziness, lightheadedness, and visual changes.  Acute CVA or TIA unlikely as symptoms are waxing and waning. Herpes Zoster evident on exam, orders to start IV acyclovir  Goal of Therapy:  Resolution of infection  Plan:   Based on current est CrCl of 33ml/min and weight of 93kg, start acyclovir 900mg  IV q12h  Monitor renal function while on acyclovir  Suggest change to PO Acyclovir 800mg  PO 5x/day or Valacyclovir 1gm PO q12h  Dannielle Huh 06/17/2012,3:48 PM

## 2012-06-17 NOTE — ED Notes (Signed)
Patient transported to MRI 

## 2012-06-17 NOTE — ED Notes (Addendum)
Patient reports weakness times 3 weeks which the PCP stated was due to a pinched nerve. Three days ago the patient reports an onset of confusion, lethargy, and dizziness. The patient reports that he came to the ED today due to a new onset of double vision, which has since resolved. Family reports some flushing in the patient's face from baseline.

## 2012-06-17 NOTE — ED Notes (Signed)
Loren Racer, MD, made aware of patient symptoms.

## 2012-06-17 NOTE — ED Notes (Signed)
Patient transported to CT 

## 2012-06-17 NOTE — Progress Notes (Signed)
Hypoglycemic Event  CBG: 65  Treatment: 8 oz of grape juice  Symptoms: none  Follow-up CBG: Time:1900 CBG Result:75  Possible Reasons for Event: NPO  Comments/MD notified:MD notified    Setzer, Don Broach  Remember to initiate Hypoglycemia Order Set & complete

## 2012-06-17 NOTE — ED Provider Notes (Signed)
History     CSN: [id number]  Arrival date & time 06/17/12  1139   First MD Initiated Contact with Patient 06/17/12 1253      Chief Complaint  Patient presents with  . Dizziness  . Altered Mental Status  . Weakness    (Consider location/radiation/quality/duration/timing/severity/associated sxs/prior treatment) HPI Comments: Patient presents with a one-day history of dizziness, lightheadedness,  if feeling off balance.he woke up this morning feeling like he was going to fall over he went to his chiropractor because he thought it was from his patient or his back. He continues to have dizziness and lightheadedness and feels like he is walking like a drunken person. While driving today he had a transient loss of vision  And double vision in his eye that has since returned. Denies any headache, chest pain, nausea or vomiting. Denies any bowel or bladder incontinence. Denies any focal weakness, numbness or tingling.   The history is provided by the patient and a relative.    Past Medical History  Diagnosis Date  . Diabetes mellitus without complication     Past Surgical History  Procedure Laterality Date  . Eye surgery    . Cardiac surgery      No family history on file.  History  Substance Use Topics  . Smoking status: Former Games developer  . Smokeless tobacco: Not on file  . Alcohol Use: No      Review of Systems  Constitutional: Positive for fatigue. Negative for fever and activity change.  HENT: Negative for congestion, rhinorrhea, neck pain and neck stiffness.   Eyes: Positive for visual disturbance.  Respiratory: Negative for cough, chest tightness and shortness of breath.   Gastrointestinal: Negative for nausea, vomiting and abdominal pain.  Genitourinary: Negative for dysuria, urgency and hematuria.  Musculoskeletal: Negative for back pain.  Skin: Positive for rash.  Neurological: Positive for dizziness, weakness, light-headedness and numbness.  Psychiatric/Behavioral:  Positive for altered mental status.   A complete 10 system review of systems was obtained and all systems are negative except as noted in the HPI and PMH.   Allergies  Review of patient's allergies indicates no known allergies.  Home Medications   Current Outpatient Rx  Name  Route  Sig  Dispense  Refill  . aspirin 325 MG tablet   Oral   Take 325 mg by mouth daily.         Marland Kitchen gabapentin (NEURONTIN) 100 MG capsule   Oral   Take 100 mg by mouth 3 (three) times daily.         Marland Kitchen glimepiride (AMARYL) 4 MG tablet   Oral   Take 4 mg by mouth daily before breakfast.         . hydrochlorothiazide (MICROZIDE) 12.5 MG capsule   Oral   Take 12.5 mg by mouth daily.         Marland Kitchen l-methylfolate-B6-B12 (METANX) 3-35-2 MG TABS   Oral   Take 1 tablet by mouth 2 (two) times daily.         Marland Kitchen losartan (COZAAR) 100 MG tablet   Oral   Take 100 mg by mouth daily.         . metFORMIN (GLUCOPHAGE) 1000 MG tablet   Oral   Take 1,000 mg by mouth 2 (two) times daily with a meal.         . metoprolol (LOPRESSOR) 50 MG tablet   Oral   Take 50 mg by mouth daily.         Marland Kitchen  Omega-3 Fatty Acids (FISH OIL) 1200 MG CAPS   Oral   Take 1 capsule by mouth 2 (two) times daily.         Marland Kitchen omeprazole (PRILOSEC) 20 MG capsule   Oral   Take 20 mg by mouth daily.         Marland Kitchen PRESCRIPTION MEDICATION      500 mg 3 (three) times daily. For shingles         . simvastatin (ZOCOR) 40 MG tablet   Oral   Take 40 mg by mouth every evening.         . vitamin B-12 (CYANOCOBALAMIN) 500 MCG tablet   Oral   Take 500 mcg by mouth 2 (two) times daily.           BP 112/55  Pulse 69  Temp(Src) 98.5 F (36.9 C) (Oral)  Resp 16  SpO2 95%  Physical Exam  Constitutional: He is oriented to person, place, and time. He appears well-developed and well-nourished. No distress.  HENT:  Head: Normocephalic.  Mouth/Throat: Oropharynx is clear and moist. No oropharyngeal exudate.  Eyes:  Conjunctivae and EOM are normal. Pupils are equal, round, and reactive to light.  Neck: Normal range of motion. Neck supple.  Cardiovascular: Normal rate, regular rhythm and normal heart sounds.   Pulmonary/Chest: Effort normal and breath sounds normal. No respiratory distress. He exhibits tenderness.  Healing vesicular type rash to right chest wall in dermatomal distribution  Abdominal: Soft. There is no tenderness. There is no rebound and no guarding.  Musculoskeletal: Normal range of motion. He exhibits no edema and no tenderness.  Neurological: He is alert and oriented to person, place, and time. No cranial nerve deficit. He exhibits normal muscle tone. Coordination normal.  CN 2-12 intact, no ataxia on finger to nose, no nystagmus, 5/5 strength throughout, no pronator drift, Positive romberg, ataxic gait  Skin: Skin is warm.    ED Course  Procedures (including critical care time)  Labs Reviewed  CBC WITH DIFFERENTIAL - Abnormal; Notable for the following:    WBC 16.8 (*)    Hemoglobin 12.6 (*)    MCV 70.9 (*)    MCH 21.8 (*)    RDW 17.5 (*)    All other components within normal limits  PROTIME-INR  COMPREHENSIVE METABOLIC PANEL  TROPONIN I  URINALYSIS, ROUTINE W REFLEX MICROSCOPIC   Dg Chest 2 View  06/17/2012   *RADIOLOGY REPORT*  Clinical Data: Weakness.  CHEST - 2 VIEW  Comparison: 10/25/ 2007  Findings: Two views of the chest demonstrate clear lungs.  No evidence for airspace disease or pulmonary edema.  Heart and mediastinum are within normal limits.  Bony thorax is intact.  IMPRESSION: No acute cardiopulmonary disease.   Original Report Authenticated By: Richarda Overlie, M.D.   Ct Head Wo Contrast  06/17/2012   *RADIOLOGY REPORT*  Clinical Data: Dizziness, altered mental status  CT HEAD WITHOUT CONTRAST  Technique:  Contiguous axial images were obtained from the base of the skull through the vertex without contrast.  Comparison: None.  Findings: Diffuse brain atrophy noted with  minor periventricular white matter microvascular ischemic change.  Mild associated ventricular enlargement.  Cerebellar atrophy as well.  No acute intracranial hemorrhage, mass lesion, definite infarction, midline shift, herniation, or extra-axial fluid collection.  Gray-white matter differentiation maintained.  Cisterns are patent.  Symmetric orbits.  The mastoids are clear.  Mucosal thickening in the right maxillary sinus.  Other sinuses clear.  IMPRESSION: Atrophy and microvascular ischemic changes.  No acute finding by noncontrast CT.  Right maxillary sinus disease.   Original Report Authenticated By: Judie Petit. Miles Costain, M.D.     No diagnosis found.    MDM  Dizziness, ataxia that onset this morning. Ataxic gait with difficulty walking. Denies headache, chest pain or shortness of breath. Visual changes that have since resolved.  Patient is not a TPA candidate secondary to delayed presentation and waxing and waning symptoms. Exam is remarkable for healing as herpes zoster. Ataxic gait with +Romberg. No other focal deficits.  CT negative.  With risk factors of DM, tobacco abuse, HLD, will admit for TIA workup.    Date: 06/17/2012  Rate: 71  Rhythm: normal sinus rhythm  QRS Axis: normal  Intervals: normal  ST/T Wave abnormalities: normal  Conduction Disutrbances:none  Narrative Interpretation:   Old EKG Reviewed: none available         Glynn Octave, MD 06/17/12 1725

## 2012-06-17 NOTE — H&P (Signed)
[facility] History and Physical  EDIBERTO SENS ZOX:[id number] DOB: 1932-01-22 DOA: 06/17/2012  Referring physician: ED physician PCP: Rogelia Boga, MD   Chief Complaint: dizziness and weakness   HPI:  Pt is 79 yo male presenting to [facility] ED with main concern of one-day history of dizziness, lightheadedness, feeling off balance, and walking like drunk person. Pt denies similar events in the past and is unsure why is he feeling this. He was recently diagnosed with shingles and was started on therapy with Prednisone. He also explains he had sudden onset blindness, transient and now resolved, he also had double vision but that has also resolved. Pt denies any specific focal neurological weakness, no fevers, chills, chest pain or shortness of breath, no abdominal or urinary concerns.  In ED, pt noted to have ataxic gate, positive Romberg sign on exam. TIA/stroke rule out requested, TR[facility] to admit pt to telemetry unit for further evaluation.  Assessment and Plan:  Principal Problem:   Dizziness of unknown cause - unclear etiology, no acute infarction on MRI, ? TIA, vs viral encephalopathy given recent shingles  - will admit pt to telemetry unit for further evaluation - will provide supportive care as needed, IVF, analgesia and antiemetics as needed - will need PT evaluation prior to discharge  Active Problems:    Recent diagnosis of shingles - pt has not been started on Acyclovir in outpatient setting  - will start today and will follow up on clinical progression    Acute renal failure - possibly pre renal in etiology - will provide IVF for now and reassess bmp in AM   DIABETES MELLITUS, TYPE II - will check A1C, monitor CBG per protocol    HYPERTENSION - reasonable control on admission, monitor vitals per protocol   Leukocytopenia, unspecified - possibly related to recent steroid use, / viral process - will continue to monitor CBC and if pt does not clinically improve,  remains febrile and has additional episodes of confusion will consider LP   Thrombocytosis - unclear etiology at this time, possibly reactive, CBC In AM  Code Status: Full Family Communication: Pt and son at bedside Disposition Plan: Admit to telemetry floor    Review of Systems:  Constitutional: Negative for fever, chills. Negative for diaphoresis.  HENT: Negative for hearing loss, ear pain, nosebleeds, congestion, sore throat, neck pain, tinnitus and ear discharge.   Eyes: Negative for pain, discharge and redness.  Respiratory: Negative for cough, hemoptysis, sputum production, shortness of breath, wheezing and stridor.   Cardiovascular: Negative for chest pain, palpitations, orthopnea, claudication and leg swelling.  Gastrointestinal: Negative for nausea, vomiting and abdominal pain. Negative for heartburn, constipation, blood in stool and melena.  Genitourinary: Negative for dysuria, urgency, frequency, hematuria and flank pain.  Musculoskeletal: Negative for myalgias, back pain, joint pain and falls.  Skin: Shingles on right side chest area  Neurological: Per HPI Endo/Heme/Allergies: Negative for environmental allergies and polydipsia. Does not bruise/bleed easily.  Psychiatric/Behavioral: Negative for suicidal ideas. The patient is not nervous/anxious.      Past Medical History  Diagnosis Date  . Diabetes mellitus without complication     Past Surgical History  Procedure Laterality Date  . Eye surgery    . Cardiac surgery      Social History:  reports that he has quit smoking. His smoking use included Cigarettes. He smoked 0.00 packs per day. He has never used smokeless tobacco. He reports that he does not drink alcohol or use illicit drugs.  No Known  Allergies  No family history of cancers  Prior to Admission medications   Medication Sig Start Date End Date Taking? Authorizing Provider  aspirin 325 MG tablet Take 325 mg by mouth daily.   Yes Historical Provider, MD   gabapentin (NEURONTIN) 100 MG capsule Take 100 mg by mouth 3 (three) times daily.   Yes Historical Provider, MD  glimepiride (AMARYL) 4 MG tablet Take 4 mg by mouth daily before breakfast.   Yes Historical Provider, MD  hydrochlorothiazide (MICROZIDE) 12.5 MG capsule Take 12.5 mg by mouth daily.   Yes Historical Provider, MD  l-methylfolate-B6-B12 (METANX) 3-35-2 MG TABS Take 1 tablet by mouth 2 (two) times daily.   Yes Historical Provider, MD  losartan (COZAAR) 100 MG tablet Take 100 mg by mouth daily.   Yes Historical Provider, MD  Lysine 500 MG TABS Take 1 tablet by mouth 3 (three) times daily.   Yes Historical Provider, MD  metFORMIN (GLUCOPHAGE) 1000 MG tablet Take 1,000 mg by mouth 2 (two) times daily with a meal.   Yes Historical Provider, MD  metoprolol (LOPRESSOR) 50 MG tablet Take 50 mg by mouth daily.   Yes Historical Provider, MD  Omega-3 Fatty Acids (FISH OIL) 1200 MG CAPS Take 1 capsule by mouth 2 (two) times daily.   Yes Historical Provider, MD  omeprazole (PRILOSEC) 20 MG capsule Take 20 mg by mouth daily.   Yes Historical Provider, MD  simvastatin (ZOCOR) 40 MG tablet Take 40 mg by mouth every evening.   Yes Historical Provider, MD  vitamin B-12 (CYANOCOBALAMIN) 500 MCG tablet Take 500 mcg by mouth 2 (two) times daily.   Yes Historical Provider, MD    Physical Exam: Filed Vitals:   06/17/12 1530 06/17/12 1555 06/17/12 1714 06/17/12 2016  BP: 111/62  133/61 106/50  Pulse: 74  79 66  Temp:   98.3 F (36.8 C) 98.2 F (36.8 C)  TempSrc:   Oral Oral  Resp:    18  Height:  6' (1.829 m) 6' (1.829 m)   Weight:  92.987 kg (205 lb) 91.2 kg (201 lb 1 oz)   SpO2:   98% 97%    Physical Exam  Constitutional: Appears well-developed and well-nourished. No distress.  HENT: Normocephalic. External right and left ear normal. Oropharynx is clear and moist.  Eyes: Conjunctivae and EOM are normal. PERRLA, no scleral icterus.  Neck: Normal ROM. Neck supple. No JVD. No tracheal deviation.  No thyromegaly.  CVS: RRR, S1/S2 +, no murmurs, no gallops, no carotid bruit.  Pulmonary: Effort and breath sounds normal, no stridor, rhonchi, wheezes, rales.  Abdominal: Soft. BS +,  no distension, tenderness, rebound or guarding.  Musculoskeletal: Normal range of motion. No edema and no tenderness.  Lymphadenopathy: No lymphadenopathy noted, cervical, inguinal. Neuro: Alert. Normal reflexes, muscle tone coordination. No cranial nerve deficit. + Romberg, ataxia  Skin: Skin is warm and dry. Right chest area vesicular rash, healing (from shingles) Psychiatric: Normal mood and affect. Behavior, judgment, thought content normal.   Labs on Admission:  Basic Metabolic Panel:  Recent Labs Lab 06/17/12 1318  NA 134*  K 4.8  CL 99  CO2 17*  GLUCOSE 137*  BUN 34*  CREATININE 1.90*  CALCIUM 10.0   Liver Function Tests:  Recent Labs Lab 06/17/12 1318  AST 19  ALT 16  ALKPHOS 46  BILITOT 0.2*  PROT 7.3  ALBUMIN 3.5   CBC:  Recent Labs Lab 06/17/12 1318  WBC 16.8*  NEUTROABS 13.3*  HGB 12.6*  HCT  41.0  MCV 70.9*  PLT 966*   Cardiac Enzymes:  Recent Labs Lab 06/17/12 1318  TROPONINI <0.30   CBG:  Recent Labs Lab 06/17/12 1732 06/17/12 1822  GLUCAP 65* 61*    Radiological Exams on Admission: Dg Chest 2 View  06/17/2012   *RADIOLOGY REPORT*  Clinical Data: Weakness.  CHEST - 2 VIEW  Comparison: 10/25/ 2007  Findings: Two views of the chest demonstrate clear lungs.  No evidence for airspace disease or pulmonary edema.  Heart and mediastinum are within normal limits.  Bony thorax is intact.  IMPRESSION: No acute cardiopulmonary disease.   Original Report Authenticated By: Richarda Overlie, M.D.   Ct Head Wo Contrast  06/17/2012   *RADIOLOGY REPORT*  Clinical Data: Dizziness, altered mental status  CT HEAD WITHOUT CONTRAST  Technique:  Contiguous axial images were obtained from the base of the skull through the vertex without contrast.  Comparison: None.  Findings:  Diffuse brain atrophy noted with minor periventricular white matter microvascular ischemic change.  Mild associated ventricular enlargement.  Cerebellar atrophy as well.  No acute intracranial hemorrhage, mass lesion, definite infarction, midline shift, herniation, or extra-axial fluid collection.  Gray-white matter differentiation maintained.  Cisterns are patent.  Symmetric orbits.  The mastoids are clear.  Mucosal thickening in the right maxillary sinus.  Other sinuses clear.  IMPRESSION: Atrophy and microvascular ischemic changes.  No acute finding by noncontrast CT.  Right maxillary sinus disease.   Original Report Authenticated By: Judie Petit. Miles Costain, M.D.   Mr Brain Wo Contrast  06/17/2012   *RADIOLOGY REPORT*  Clinical Data: Dizziness with altered mental status. Hypertension. Diabetes.  Hyperlipidemia.  MRI HEAD WITHOUT CONTRAST  Technique:  Multiplanar, multiecho pulse sequences of the brain and surrounding structures were obtained according to standard protocol without intravenous contrast.  Comparison: CT head earlier today.  Findings: There is no evidence for acute infarction, intracranial hemorrhage, mass lesion, hydrocephalus, or extra-axial fluid. Moderate atrophy.  Mild to moderate periventricular greater than subcortical white matter signal abnormality, likely chronic microvascular ischemic change.  No foci of chronic hemorrhage.  No proximal vascular occlusion is evident.  Mild chronic sinus disease is evident, most notable in the right maxillary sinus.  There is a centimeter sized structure in the dependent right maxillary sinus, roughly spherical, with increased T1 and decreased T2 signal, possible mycetoma or fungal sinus ball. No expansion of the sinus.  Correlate clinically.  No midline abnormalities.  No acute osseous findings.  Left cataract extraction.  Compared with prior CT, there is good general agreement.  IMPRESSION: Chronic changes of moderate atrophy and mild to moderate chronic  microvascular ischemic change.  No visible acute intracranial abnormality.  No evidence for brainstem or posterior fossa infarct.  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   Original Report Authenticated By: Davonna Belling, M.D.    EKG: Normal sinus rhythm, no ST/T wave changes  Debbora Presto, MD  [facility] Pager [phone]  If 7PM-7AM, please contact night-coverage [email or web address] Password [facility] 06/17/2012, 8:21 PM

## 2012-06-18 DIAGNOSIS — N179 Acute kidney failure, unspecified: Secondary | ICD-10-CM

## 2012-06-18 DIAGNOSIS — I951 Orthostatic hypotension: Secondary | ICD-10-CM

## 2012-06-18 DIAGNOSIS — R269 Unspecified abnormalities of gait and mobility: Secondary | ICD-10-CM

## 2012-06-18 DIAGNOSIS — E43 Unspecified severe protein-calorie malnutrition: Secondary | ICD-10-CM | POA: Insufficient documentation

## 2012-06-18 DIAGNOSIS — I517 Cardiomegaly: Secondary | ICD-10-CM

## 2012-06-18 DIAGNOSIS — R2681 Unsteadiness on feet: Secondary | ICD-10-CM

## 2012-06-18 DIAGNOSIS — R42 Dizziness and giddiness: Secondary | ICD-10-CM

## 2012-06-18 LAB — CBC
HCT: 36.8 % — ABNORMAL LOW (ref 39.0–52.0)
Hemoglobin: 11.3 g/dL — ABNORMAL LOW (ref 13.0–17.0)
MCHC: 30.7 g/dL (ref 30.0–36.0)
RBC: 5.27 MIL/uL (ref 4.22–5.81)
WBC: 13.2 10*3/uL — ABNORMAL HIGH (ref 4.0–10.5)

## 2012-06-18 LAB — GLUCOSE, CAPILLARY
Glucose-Capillary: 103 mg/dL — ABNORMAL HIGH (ref 70–99)
Glucose-Capillary: 189 mg/dL — ABNORMAL HIGH (ref 70–99)

## 2012-06-18 LAB — BASIC METABOLIC PANEL
BUN: 31 mg/dL — ABNORMAL HIGH (ref 6–23)
CO2: 24 mEq/L (ref 19–32)
Chloride: 102 mEq/L (ref 96–112)
Glucose, Bld: 138 mg/dL — ABNORMAL HIGH (ref 70–99)
Potassium: 4.1 mEq/L (ref 3.5–5.1)

## 2012-06-18 LAB — LIPID PANEL
HDL: 30 mg/dL — ABNORMAL LOW (ref >39)
LDL Cholesterol: 40 mg/dL (ref 0–99)
Triglycerides: 151 mg/dL — ABNORMAL HIGH (ref <150)

## 2012-06-18 LAB — HEMOGLOBIN A1C: Hgb A1c MFr Bld: 5.5 % (ref <5.7)

## 2012-06-18 LAB — TSH: TSH: 5.449 u[IU]/mL — ABNORMAL HIGH (ref 0.350–4.500)

## 2012-06-18 LAB — CK: Total CK: 22 U/L (ref 7–232)

## 2012-06-18 LAB — IRON AND TIBC: Saturation Ratios: 5 % — ABNORMAL LOW (ref 20–55)

## 2012-06-18 MED ORDER — FERROUS SULFATE 325 (65 FE) MG PO TABS
325.0000 mg | ORAL_TABLET | Freq: Two times a day (BID) | ORAL | Status: DC
  Administered 2012-06-18 – 2012-06-19 (×2): 325 mg via ORAL
  Filled 2012-06-18 (×4): qty 1

## 2012-06-18 MED ORDER — DOCUSATE SODIUM 100 MG PO CAPS
200.0000 mg | ORAL_CAPSULE | Freq: Every evening | ORAL | Status: DC | PRN
  Administered 2012-06-18: 200 mg via ORAL
  Filled 2012-06-18 (×2): qty 2

## 2012-06-18 MED ORDER — BOOST PLUS PO LIQD
237.0000 mL | Freq: Two times a day (BID) | ORAL | Status: DC
  Filled 2012-06-18: qty 237

## 2012-06-18 MED ORDER — SODIUM CHLORIDE 0.9 % IV SOLN
1020.0000 mg | Freq: Once | INTRAVENOUS | Status: AC
  Administered 2012-06-18: 1020 mg via INTRAVENOUS
  Filled 2012-06-18: qty 34

## 2012-06-18 MED ORDER — METOPROLOL TARTRATE 25 MG PO TABS
25.0000 mg | ORAL_TABLET | Freq: Every day | ORAL | Status: DC
  Administered 2012-06-18 – 2012-06-19 (×2): 25 mg via ORAL
  Filled 2012-06-18 (×2): qty 1

## 2012-06-18 MED ORDER — SODIUM CHLORIDE 0.9 % IV SOLN
INTRAVENOUS | Status: DC
  Administered 2012-06-18 (×2): via INTRAVENOUS

## 2012-06-18 MED ORDER — ONDANSETRON HCL 4 MG/2ML IJ SOLN
4.0000 mg | Freq: Three times a day (TID) | INTRAMUSCULAR | Status: AC | PRN
  Filled 2012-06-18: qty 2

## 2012-06-18 NOTE — Evaluation (Signed)
Physical Therapy Evaluation Patient Details Name: [name] MRN: [id number] DOB: 05/21/1932 Today's Date: 06/18/2012 Time: 1308-6578 PT Time Calculation (min): 14 min  PT Assessment / Plan / Recommendation Clinical Impression  Pt is a 79 year old male admitted for dizziness and weakness with hx of recent shingles and peripheral neuropathy.  Pt would benefit from acute PT services in order to improve transfers and gait safely to prepare for d/c home.  Pt willing to try RW next visit for safety and denied dizziness throughout session.  Also educated on checking feet due to reporting numbness and hx of peripheral neuropathy.     PT Assessment  Patient needs continued PT services    Follow Up Recommendations  Home health PT    Does the patient have the potential to tolerate intense rehabilitation      Barriers to Discharge        Equipment Recommendations  None recommended by PT    Recommendations for Other Services     Frequency Min 3X/week    Precautions / Restrictions Precautions Precautions: Fall Restrictions Weight Bearing Restrictions: No   Pertinent Vitals/Pain n/a      Mobility  Bed Mobility Bed Mobility: Not assessed Transfers Transfers: Sit to Stand;Stand to Sit Sit to Stand: 4: Min guard;With upper extremity assist;From chair/3-in-1 Stand to Sit: 4: Min guard;With upper extremity assist;To chair/3-in-1 Details for Transfer Assistance: no cues needed, min/guard for safety due to admitted for dizziness Ambulation/Gait Ambulation/Gait Assistance: 4: Min guard Ambulation Distance (Feet): 160 Feet Assistive device: None Ambulation/Gait Assistance Details: pt reports legs feel weak and lately he has short step gait pattern but hopes to return to his regular gait pattern Gait Pattern: Step-through pattern;Narrow base of support Gait velocity: decreased except near end increased speed (reports increase in speed as he fatigues) General Gait Details: bil toe  out, knees buckling x1 but no assist needed    Exercises     PT Diagnosis: Difficulty walking  PT Problem List: Decreased strength;Decreased knowledge of use of DME;Decreased mobility;Decreased balance PT Treatment Interventions: DME instruction;Gait training;Functional mobility training;Stair training;Therapeutic activities;Therapeutic exercise;Patient/family education;Balance training   PT Goals Acute Rehab PT Goals PT Goal Formulation: With patient Time For Goal Achievement: 06/25/12 Potential to Achieve Goals: Good Pt will go Sit to Stand: with modified independence PT Goal: Sit to Stand - Progress: Goal set today Pt will go Stand to Sit: with modified independence PT Goal: Stand to Sit - Progress: Goal set today Pt will Ambulate: 51 - 150 feet;with modified independence;with least restrictive assistive device PT Goal: Ambulate - Progress: Goal set today Pt will Go Up / Down Stairs: 6-9 stairs;with supervision;with rail(s) PT Goal: Up/Down Stairs - Progress: Goal set today Pt will Perform Home Exercise Program: with supervision, verbal cues required/provided PT Goal: Perform Home Exercise Program - Progress: Goal set today  Visit Information  Last PT Received On: 06/18/12 Assistance Needed: +1    Subjective Data  Subjective: I like your dimples.   Prior Functioning  Home Living Lives With: Family Available Help at Discharge:  (family mornings and nights) Type of Home: Mobile home Home Access: Stairs to enter [employer] of Steps: 8-9 Entrance Stairs-Rails: Right;Left Home Layout: One level Bathroom Shower/Tub: Engineer, manufacturing systems: Standard Home Adaptive Equipment: Programmer, systems - four wheeled Prior Function Level of Independence: Independent Able to Take Stairs?: Yes Driving: Yes Vocation: Part time employment Comments: works 1 day a week at [employer] in and out of cars all day  Communication Communication: No difficulties     Cognition  Cognition Arousal/Alertness: Awake/alert Behavior During Therapy: WFL for tasks assessed/performed Overall Cognitive Status: Within Functional Limits for tasks assessed    Extremity/Trunk Assessment Right Upper Extremity Assessment RUE ROM/Strength/Tone: Within functional levels Left Upper Extremity Assessment LUE ROM/Strength/Tone: Within functional levels Right Lower Extremity Assessment RLE ROM/Strength/Tone: WFL for tasks assessed RLE Sensation: History of peripheral neuropathy Left Lower Extremity Assessment LLE ROM/Strength/Tone: WFL for tasks assessed LLE Sensation: History of peripheral neuropathy   Balance    End of Session PT - End of Session Activity Tolerance: Patient tolerated treatment well Patient left: in chair;with call bell/phone within reach;with chair alarm set  GP     Emera Bussie,KATHrine E 06/18/2012, 12:57 PM Zenovia Jarred, PT, DPT 06/18/2012 Pager: [phone]

## 2012-06-18 NOTE — Evaluation (Signed)
Speech Language Pathology Evaluation Patient Details Name: [name] MRN: [id number] DOB: 08/02/1932 Today's Date: 06/18/2012 Time: 7829-5621 SLP Time Calculation (min): 13 min  Problem List:  Patient Active Problem List   Diagnosis Date Noted  . Dizziness of unknown cause 06/17/2012  . Acute renal failure 06/17/2012  . Leukocytopenia, unspecified 06/17/2012  . Thrombocytosis 06/17/2012  . HYPERLIPIDEMIA 02/29/2008  . CORONARY ARTERY DISEASE 02/29/2008  . GERD 02/29/2008  . LOW BACK PAIN 02/29/2008  . DIABETES MELLITUS, TYPE II 10/15/2007  . HYPERTENSION 10/15/2007  . URI 10/15/2007   Past Medical History:  Past Medical History  Diagnosis Date  . Diabetes mellitus without complication    Past Surgical History:  Past Surgical History  Procedure Laterality Date  . Eye surgery    . Cardiac surgery     HPI:  Pt is 79 yo male presenting to [facility] ED with main concern of one-day history of dizziness, lightheadedness, feeling off balance, and walking like drunk person. Pt denies similar events in the past and is unsure why is he feeling this. He was recently diagnosed with shingles and was started on therapy with Prednisone. He also explains he had sudden onset blindness, transient and now resolved, he also had double vision but that has also resolved. Pt denies any specific focal neurological weakness, no fevers, chills, chest pain or shortness of breath, no abdominal or urinary concerns. In ED, pt noted to have ataxic gate, positive Romberg sign on exam  No acute changes per MRI.   Assessment / Plan / Recommendation Clinical Impression  Pt presents with normal cognitive, communicative, and speech function.  No SLP f/u warranted.    SLP Assessment  Patient does not need any further Speech Language Pathology Services    Follow Up Recommendations  None    Frequency and Duration   n/a     Pertinent Vitals/Pain none   SLP Goals     SLP Evaluation Prior Functioning  Cognitive/Linguistic Baseline: Within functional limits Type of Home: Mobile home Lives With: Family Available Help at Discharge:  (family mornings and nights) Vocation: Part time employment   Cognition  Overall Cognitive Status: Within Functional Limits for tasks assessed Arousal/Alertness: Awake/alert Orientation Level: Oriented X4    Comprehension  Auditory Comprehension Overall Auditory Comprehension: Appears within functional limits for tasks assessed Visual Recognition/Discrimination Discrimination: Within Function Limits Reading Comprehension Reading Status: Within funtional limits    Expression Expression Primary Mode of Expression: Verbal Verbal Expression Overall Verbal Expression: Appears within functional limits for tasks assessed   Oral / Motor Oral Motor/Sensory Function Overall Oral Motor/Sensory Function: Appears within functional limits for tasks assessed Motor Speech Overall Motor Speech: Appears within functional limits for tasks assessed   GO    Rilyn Upshaw L. Samson Frederic, [place] CCC/SLP Pager [phone]  Blenda Mounts Laurice 06/18/2012, 1:14 PM

## 2012-06-18 NOTE — Progress Notes (Signed)
VASCULAR LAB PRELIMINARY  PRELIMINARY  PRELIMINARY  PRELIMINARY  Carotid duplex completed.    Preliminary report:  Bilateral:  Less than 39% ICA stenosis.  Vertebral artery flow is antegrade.     [name], RVS 06/18/2012, 10:30 AM

## 2012-06-18 NOTE — Progress Notes (Signed)
Echocardiogram 2D Echocardiogram has been performed.  Tyrone Pautsch 06/18/2012, 10:44 AM

## 2012-06-18 NOTE — Progress Notes (Addendum)
[facility] PROGRESS NOTE  [name] WGN:[id number] DOB: [birth year] DOA: 06/17/2012 PCP: Rogelia Boga, MD  Assessment/Plan: Gait instability/ataxia/dizziness/vertigo -Likely multifactorial including but not limited to dehydration, medications, renal failure -Patient had positive orthostatics with a 19 mmHg drop from sitting to standing -Discontinue gabapentin, hydrochlorothiazide -Decrease metoprolol tartrate dosing -MRI brain negative for acute stroke -Echocardiogram -Cycle troponins-neg -EKG is reassuring Orthostatic hypotension -IV fluids  -await echocardiogram results Acute kidney injury -IV fluids -Renal ultrasound -Recheck BMP in the morning -Check CPK -Discontinue hydrochlorothiazide Right maxillary sinus density -Possible mycetoma -Case was discussed with ENT, Dr. Danae Chen acute intervention is necessary -follow up appointment was set up with Dr. Ezzard Standing for 06/27/12 at 3pm Herpes zoster dermatitis -Localized infection -All of his lesions have involuted -Discontinue IV acyclovir and observe Diabetes mellitus type 2  -Hemoglobin A1C= 5.5 -Patient has been mildly hypoglycemic likely due to a combination of his Amaryl with renal failure -Discontinue Amaryl  Elevated TSH  -Check free T4  Iron deficiency anemia -Replace with IV iron x1 -Start ferrous sulfate po Thrombocytosis  -Likely due to the patient's iron deficiency anemia   Family Communication:   Son updated Disposition Plan:   Home when medically stable      Procedures/Studies: Dg Chest 2 View  06/17/2012   *RADIOLOGY REPORT*  Clinical Data: Weakness.  CHEST - 2 VIEW  Comparison: 10/25/ 2007  Findings: Two views of the chest demonstrate clear lungs.  No evidence for airspace disease or pulmonary edema.  Heart and mediastinum are within normal limits.  Bony thorax is intact.  IMPRESSION: No acute cardiopulmonary disease.   Original Report Authenticated By: Richarda Overlie, M.D.    Ct Head Wo Contrast  06/17/2012   *RADIOLOGY REPORT*  Clinical Data: Dizziness, altered mental status  CT HEAD WITHOUT CONTRAST  Technique:  Contiguous axial images were obtained from the base of the skull through the vertex without contrast.  Comparison: None.  Findings: Diffuse brain atrophy noted with minor periventricular white matter microvascular ischemic change.  Mild associated ventricular enlargement.  Cerebellar atrophy as well.  No acute intracranial hemorrhage, mass lesion, definite infarction, midline shift, herniation, or extra-axial fluid collection.  Gray-white matter differentiation maintained.  Cisterns are patent.  Symmetric orbits.  The mastoids are clear.  Mucosal thickening in the right maxillary sinus.  Other sinuses clear.  IMPRESSION: Atrophy and microvascular ischemic changes.  No acute finding by noncontrast CT.  Right maxillary sinus disease.   Original Report Authenticated By: Judie Petit. Miles Costain, M.D.   Mr Brain Wo Contrast  06/17/2012   *RADIOLOGY REPORT*  Clinical Data: Dizziness with altered mental status. Hypertension. Diabetes.  Hyperlipidemia.  MRI HEAD WITHOUT CONTRAST  Technique:  Multiplanar, multiecho pulse sequences of the brain and surrounding structures were obtained according to standard protocol without intravenous contrast.  Comparison: CT head earlier today.  Findings: There is no evidence for acute infarction, intracranial hemorrhage, mass lesion, hydrocephalus, or extra-axial fluid. Moderate atrophy.  Mild to moderate periventricular greater than subcortical white matter signal abnormality, likely chronic microvascular ischemic change.  No foci of chronic hemorrhage.  No proximal vascular occlusion is evident.  Mild chronic sinus disease is evident, most notable in the right maxillary sinus.  There is a centimeter sized structure in the dependent right maxillary sinus, roughly spherical, with increased T1 and decreased T2 signal, possible mycetoma or fungal sinus ball.  No expansion of the sinus.  Correlate clinically.  No midline abnormalities.  No acute osseous findings.  Left cataract extraction.  Compared with  prior CT, there is good general agreement.  IMPRESSION: Chronic changes of moderate atrophy and mild to moderate chronic microvascular ischemic change.  No visible acute intracranial abnormality.  No evidence for brainstem or posterior fossa infarct.  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   Original Report Authenticated By: Davonna Belling, M.D.         Subjective:  patient states that he never had any dizziness yesterday. Overall, his dizziness has improved. He denies any chest discomfort, shortness breath, palpitations, nausea, vomiting, diarrhea, abdominal pain, dysuria, hematuria. He denies any pain from his shingles.  Objective: Filed Vitals:   06/18/12 0544 06/18/12 0924 06/18/12 0925 06/18/12 0926  BP: 114/61 119/50 120/63 101/59  Pulse: 66 62 66 72  Temp: 98.2 F (36.8 C)     TempSrc: Oral     Resp: 18     Height:      Weight:      SpO2: 96%       Intake/Output Summary (Last 24 hours) at 06/18/12 1357 Last data filed at 06/18/12 0900  Gross per 24 hour  Intake 1094.67 ml  Output   1200 ml  Net -105.33 ml   Weight change:  Exam:   General:  Pt is alert, follows commands appropriately, not in acute distress  HEENT: No icterus, No thrush,  [place]/AT  Cardiovascular: RRR, S1/S2, no rubs, no gallops  Respiratory: CTA bilaterally, no wheezing, no crackles, no rhonchi  Abdomen: Soft/+BS, non tender, non distended, no guarding  Extremities: No edema, No lymphangitis, No petechiae, No rashes, no synovitis; involuted vesicles extending from the patient's right midclavicular line wrapping around to the patient's shoulder blade--no lymphangitis, no crepitance, no tenderness to palpation   Data Reviewed: Basic Metabolic Panel:  Recent Labs Lab 06/17/12 1318 06/18/12 0506  NA 134* 137  K 4.8 4.1  CL 99 102   CO2 17* 24  GLUCOSE 137* 138*  BUN 34* 31*  CREATININE 1.90* 1.90*  CALCIUM 10.0 9.3   Liver Function Tests:  Recent Labs Lab 06/17/12 1318  AST 19  ALT 16  ALKPHOS 46  BILITOT 0.2*  PROT 7.3  ALBUMIN 3.5   No results found for this basename: LIPASE, AMYLASE,  in the last 168 hours No results found for this basename: AMMONIA,  in the last 168 hours CBC:  Recent Labs Lab 06/17/12 1318 06/18/12 0506  WBC 16.8* 13.2*  NEUTROABS 13.3*  --   HGB 12.6* 11.3*  HCT 41.0 36.8*  MCV 70.9* 69.8*  PLT 966* 815*   Cardiac Enzymes:  Recent Labs Lab 06/17/12 1318 06/18/12 0800  TROPONINI <0.30 <0.30   BNP: No components found with this basename: POCBNP,  CBG:  Recent Labs Lab 06/17/12 1822 06/17/12 1900 06/17/12 2236 06/18/12 0723 06/18/12 1157  GLUCAP 61* 75 169* 103* 175*    No results found for this or any previous visit (from the past 240 hour(s)).   Scheduled Meds: . acyclovir  900 mg Intravenous Q12H  . aspirin  325 mg Oral Daily  . vitamin B-12  500 mcg Oral BID  . folic acid-pyridoxine-cyancobalamin  1 tablet Oral Daily  . insulin aspart  0-15 Units Subcutaneous TID WC  . insulin aspart  0-5 Units Subcutaneous QHS  . metoprolol  25 mg Oral Daily  . omega-3 acid ethyl esters  1 g Oral BID  . pantoprazole  40 mg Oral Daily  . simvastatin  40 mg Oral QPM   Continuous Infusions: . sodium chloride 75 mL/hr at 06/18/12  1610     Elric Tirado, DO  [facility] Pager [phone]  If 7PM-7AM, please contact night-coverage [email or web address] Password [email or web address] 06/18/2012, 1:57 PM   LOS: 1 day

## 2012-06-18 NOTE — Progress Notes (Signed)
Pt had 1.88 second pause.  MD notified.  Will continue to monitor.

## 2012-06-18 NOTE — Progress Notes (Signed)
Patient with shingles to right flank area.  Shingles are all crusted over and no drainage noted.  Notified Vernona Rieger with Infection Prevention and okay to d/c airborne isolation.  Allayne Butcher [facility]  06/18/2012  10:45 AM

## 2012-06-18 NOTE — Evaluation (Addendum)
Occupational Therapy Evaluation Patient Details Name: [name] MRN: [id number] DOB: 11-20-1932 Today's Date: 06/18/2012 Time: 4098-1191 OT Time Calculation (min): 20 min  OT Assessment / Plan / Recommendation Clinical Impression  This 79 year old man was admitted wtih dizziness.  He also has shingles and peripheral neuropathy.  Pt is overall min guard to min A for adls and transfers.  He will benefit from skilled OT to increase independence with adls.      OT Assessment  Patient needs continued OT Services    Follow Up Recommendations  Home health OT    Barriers to Discharge      Equipment Recommendations   (to be further assessed)    Recommendations for Other Services    Frequency  Min 2X/week    Precautions / Restrictions Precautions Precautions: Fall Restrictions Weight Bearing Restrictions: No   Pertinent Vitals/Pain Back sore from shingles.  Knees sore, R middle finger hurts.  Repositioned.  Pt denies dizziness    ADL  Grooming: Set up Where Assessed - Grooming: Unsupported sitting Upper Body Bathing: Set up Where Assessed - Upper Body Bathing: Unsupported sitting Lower Body Bathing: Min guard Where Assessed - Lower Body Bathing: Unsupported sit to stand Upper Body Dressing: Minimal assistance (iv) Where Assessed - Upper Body Dressing: Unsupported sitting Lower Body Dressing: Min guard Where Assessed - Lower Body Dressing: Unsupported sit to stand Toilet Transfer: Minimal assistance Toilet Transfer Method: Sit to stand Toilet Transfer Equipment:  (chair, ambulated and back to chair) Toileting - Clothing Manipulation and Hygiene: Minimal assistance Where Assessed - Toileting Clothing Manipulation and Hygiene: Sit to stand from 3-in-1 or toilet Equipment Used:  (hand held assist) Transfers/Ambulation Related to ADLs: ambulated in hall.  Pt gains momentum and leans forward--has neuropathy.  will try RW on next visit ADL Comments: Min A for balance with adls.   Pt able to cross legs to don socks.  He c/o some knee pain today:  thinks it is from being in bed x 1 day    OT Diagnosis: Generalized weakness  OT Problem List: Decreased strength;Impaired balance (sitting and/or standing);Decreased activity tolerance OT Treatment Interventions: Self-care/ADL training;Balance training;Patient/family education;DME and/or AE instruction   OT Goals Acute Rehab OT Goals OT Goal Formulation: With patient Time For Goal Achievement: 07/02/12 Potential to Achieve Goals: Good ADL Goals Pt Will Transfer to Toilet: with supervision;Ambulation;Regular height toilet ADL Goal: Toilet Transfer - Progress: Goal set today Pt Will Perform Tub/Shower Transfer: Tub transfer;Ambulation;with supervision ADL Goal: Tub/Shower Transfer - Progress: Goal set today Miscellaneous OT Goals Miscellaneous OT Goal #1: Pt will gather clothes at supervision level and complete adl (wtihout supervision) OT Goal: Miscellaneous Goal #1 - Progress: Goal set today  Visit Information  Last OT Received On: 06/18/12 Assistance Needed: +1 PT/OT Co-Evaluation/Treatment: Yes    Subjective Data  Subjective: This neuropathy has been going on for about a year. Patient Stated Goal: be independent, get back to work if possible   Prior Functioning     Home Living Lives With: Family Available Help at Discharge:  (family mornings and nights) Type of Home: Mobile home Home Access: Stairs to enter [employer] of Steps: 8-9 Entrance Stairs-Rails: Right;Left Home Layout: One level Bathroom Shower/Tub: Engineer, manufacturing systems: Standard Home Adaptive Equipment: Programmer, systems - four wheeled Prior Function Level of Independence: Independent Able to Take Stairs?: Yes Driving: Yes Vocation: Part time employment Comments: works 1 day a week at [employer] in and out of cars all day Communication  Communication: No difficulties         Vision/Perception      Cognition  Cognition Arousal/Alertness: Awake/alert Behavior During Therapy: WFL for tasks assessed/performed Overall Cognitive Status: Within Functional Limits for tasks assessed    Extremity/Trunk Assessment Right Upper Extremity Assessment RUE ROM/Strength/Tone: Within functional levels Left Upper Extremity Assessment LUE ROM/Strength/Tone: Within functional levels     Mobility Bed Mobility Bed Mobility: Not assessed Transfers Transfers: Sit to Stand Sit to Stand: 4: Min guard Details for Transfer Assistance: no cues needed     Exercise     Balance     End of Session OT - End of Session Activity Tolerance: Patient tolerated treatment well Patient left: in chair;with call bell/phone within reach;with chair alarm set  GO     Carrisa Keller 06/18/2012, 11:47 AM Marica Otter, OTR/L [phone] 06/18/2012

## 2012-06-18 NOTE — Progress Notes (Signed)
INITIAL NUTRITION ASSESSMENT  Pt meets criteria for severe MALNUTRITION in the context of chronic illness as evidenced by <75% estimated energy intake with 14.8% weight loss in the past 2 months per pt report.  DOCUMENTATION CODES Per approved criteria  -Severe malnutrition in the context of chronic illness   INTERVENTION: - Boost Plus BID - Encouraged high protein/calorie diet to prevent loss of lean body mass. Assisted pt with ordering high protein meal.  - Will continue to monitor  NUTRITION DIAGNOSIS: Unintended weight loss related to poor appetite as evidenced by weight trend, pt report.   Goal: Pt to consume 100% of meals/supplements  Monitor:  Weights, labs, intake  Reason for Assessment: Nutrition risk, RN request   79 y.o. male  Admitting Dx: Dizziness of unknown cause  ASSESSMENT: Pt discussed during interdisciplinary rounds. RN contacted RD regarding pt's unintended weight loss PTA. Pt admitted with dizziness. Pt reported weakness that was going on for 3 weeks. Pt negative for stroke.   Met with pt who reports poor appetite for the past 2 months with  pound unintended weight loss of 34-35 pounds. Pt's daughter-in-law does the cooking and pt eats 1-2 meals/day and drinks 1 chocolate Boost/day. Pt reports eating 100% of breakfast this morning.    Nutrition Focused Physical Exam:  Subcutaneous Fat:  Orbital Region: WNL Upper Arm Region: WNL Thoracic and Lumbar Region: WNL  Muscle:  Temple Region: WNL Clavicle Bone Region: WNL Clavicle and Acromion Bone Region: WNL Scapular Bone Region: WNL Dorsal Hand: WNL Patellar Region: WNL Anterior Thigh Region: WNL Posterior Calf Region: WNL  Edema: Intact    Height: Ht Readings from Last 1 Encounters:  06/17/12 6' (1.829 m)    Weight: Wt Readings from Last 1 Encounters:  06/17/12 201 lb 1 oz (91.2 kg)    Ideal Body Weight: 178 lb  % Ideal Body Weight: 113  Wt Readings from Last 10 Encounters:  06/17/12  201 lb 1 oz (91.2 kg)  09/26/08 240 lb (108.863 kg)  07/04/08 241 lb (109.317 kg)  02/29/08 240 lb (108.863 kg)  10/15/07 240 lb (108.863 kg)    Usual Body Weight: 235-236 lb 2 months ago per pt report  % Usual Body Weight: 85  BMI:  Body mass index is 27.26 kg/(m^2).  Estimated Nutritional Needs: Kcal: 2000-2200 Protein: 100-115g Fluid: 2-2.2L/day  Skin: Shingles to right upper flank  Diet Order: General  EDUCATION NEEDS: -No education needs identified at this time   Intake/Output Summary (Last 24 hours) at 06/18/12 1444 Last data filed at 06/18/12 0900  Gross per 24 hour  Intake 1094.67 ml  Output   1200 ml  Net -105.33 ml    Last BM: 6/10  Labs:   Recent Labs Lab 06/17/12 1318 06/18/12 0506  NA 134* 137  K 4.8 4.1  CL 99 102  CO2 17* 24  BUN 34* 31*  CREATININE 1.90* 1.90*  CALCIUM 10.0 9.3  GLUCOSE 137* 138*    CBG (last 3)   Recent Labs  06/17/12 2236 06/18/12 0723 06/18/12 1157  GLUCAP 169* 103* 175*    Scheduled Meds: . aspirin  325 mg Oral Daily  . vitamin B-12  500 mcg Oral BID  . ferrous sulfate  325 mg Oral BID WC  . ferumoxytol  1,020 mg Intravenous Once  . folic acid-pyridoxine-cyancobalamin  1 tablet Oral Daily  . insulin aspart  0-15 Units Subcutaneous TID WC  . insulin aspart  0-5 Units Subcutaneous QHS  . metoprolol  25 mg  Oral Daily  . omega-3 acid ethyl esters  1 g Oral BID  . pantoprazole  40 mg Oral Daily  . simvastatin  40 mg Oral QPM    Continuous Infusions: . sodium chloride 75 mL/hr at 06/18/12 1914    Past Medical History  Diagnosis Date  . Diabetes mellitus without complication     Past Surgical History  Procedure Laterality Date  . Eye surgery    . Cardiac surgery       Levon Hedger MS, RD, LDN [phone] Pager [phone] After Hours Pager

## 2012-06-19 DIAGNOSIS — D473 Essential (hemorrhagic) thrombocythemia: Secondary | ICD-10-CM

## 2012-06-19 LAB — BASIC METABOLIC PANEL
Calcium: 9.2 mg/dL (ref 8.4–10.5)
Chloride: 102 mEq/L (ref 96–112)
Creatinine, Ser: 1.25 mg/dL (ref 0.50–1.35)
GFR calc Af Amer: 62 mL/min — ABNORMAL LOW (ref >90)

## 2012-06-19 LAB — PATHOLOGIST SMEAR REVIEW

## 2012-06-19 LAB — T4, FREE: Free T4: 1.18 ng/dL (ref 0.80–1.80)

## 2012-06-19 LAB — CBC
Platelets: 766 10*3/uL — ABNORMAL HIGH (ref 150–400)
RDW: 17 % — ABNORMAL HIGH (ref 11.5–15.5)
WBC: 14.2 10*3/uL — ABNORMAL HIGH (ref 4.0–10.5)

## 2012-06-19 LAB — GLUCOSE, CAPILLARY
Glucose-Capillary: 243 mg/dL — ABNORMAL HIGH (ref 70–99)
Glucose-Capillary: 179 mg/dL — ABNORMAL HIGH (ref 70–99)

## 2012-06-19 MED ORDER — BISACODYL 10 MG RE SUPP
10.0000 mg | Freq: Every day | RECTAL | Status: DC | PRN
  Administered 2012-06-19: 10 mg via RECTAL
  Filled 2012-06-19: qty 1

## 2012-06-19 MED ORDER — FERROUS SULFATE 325 (65 FE) MG PO TABS: 325.0000 mg | ORAL_TABLET | Freq: Two times a day (BID) | ORAL | Status: DC

## 2012-06-19 NOTE — Care Management Note (Signed)
Cm spoke with patient at bedside concerning discharge planning with adult son present. PT eval recommendation for HHPT/OT. Per pt choice [facility] to provide [facility] services. [facility] rep Lanae Crumbly made aware. Pt states having RW and wheelchair at home for DME use. No other needs identified. Pt resides with adult son who will provide tx home.   Roxy Manns Kelsy Polack,RN,BSN [phone]

## 2012-06-19 NOTE — Progress Notes (Signed)
Occupational Therapy Treatment Patient Details Name: [name] MRN: [id number] DOB: 26-Apr-1932 Today's Date: 06/19/2012 Time: 7829-5621 OT Time Calculation (min): 18 min  OT Assessment / Plan / Recommendation Comments on Treatment Session Pt initially stands better but with dynamic activity, needs min guard    Follow Up Recommendations  Home health OT    Barriers to Discharge       Equipment Recommendations  None recommended by OT (pt has shower stall)    Recommendations for Other Services    Frequency Min 2X/week   Plan      Precautions / Restrictions Precautions Precautions: Fall Restrictions Weight Bearing Restrictions: No   Pertinent Vitals/Pain No pain reported.  Pt is uncomfortable because he hasn't had BM since Sunday    ADL  Lower Body Dressing: Min guard Where Assessed - Lower Body Dressing: Unsupported sit to stand Toilet Transfer: Min Pension scheme manager Method: Sit to Barista: Regular height toilet (used 16" shower seat to simulate) Toileting - Architect and Hygiene: Min guard Where Assessed - Toileting Clothing Manipulation and Hygiene: Sit to stand from 3-in-1 or toilet Tub/Shower Transfer: Min guard (simulated ledge with trash can) Tub/Shower Transfer Method: Stand pivot Transfers/Ambulation Related to ADLs: Pt discharging home today.   ADL Comments: Pt unsteady at times but no LOB    OT Diagnosis:    OT Problem List:   OT Treatment Interventions:     OT Goals Acute Rehab OT Goals Time For Goal Achievement: 07/02/12 ADL Goals Pt Will Transfer to Toilet: with supervision;Ambulation;Regular height toilet ADL Goal: Toilet Transfer - Progress: Progressing toward goals Pt Will Perform Tub/Shower Transfer: Tub transfer;Ambulation;with supervision ADL Goal: Tub/Shower Transfer - Progress: Progressing toward goals Miscellaneous OT Goals Miscellaneous OT Goal #1: Pt will gather clothes at supervision level and  complete adl (wtihout supervision) OT Goal: Miscellaneous Goal #1 - Progress: Progressing toward goals  Visit Information  Last OT Received On: 06/19/12 Assistance Needed: +1    Subjective Data      Prior Functioning       Cognition  Cognition Arousal/Alertness: Awake/alert Behavior During Therapy: WFL for tasks assessed/performed Overall Cognitive Status: Within Functional Limits for tasks assessed    Mobility  Bed Mobility Bed Mobility: Not assessed Transfers Sit to Stand: 5: Supervision Stand to Sit: 4: Min guard;With upper extremity assist;To chair/3-in-1 Details for Transfer Assistance: no cues needed    Exercises     Balance     End of Session OT - End of Session Activity Tolerance: Patient tolerated treatment well Patient left: in chair;with call bell/phone within reach;with chair alarm set  GO     [name] 06/19/2012, 2:43 PM Marica Otter, OTR/L [phone] 06/19/2012

## 2012-06-19 NOTE — Discharge Summary (Signed)
Physician Discharge Summary  [name] FAO:[id number] DOB: [birth year] DOA: 06/17/2012  PCP: Rogelia Boga, MD  Admit date: 06/17/2012 Discharge date: 06/19/2012  Recommendations for Outpatient Follow-up:  1. Pt will need to follow up with PCP in 2 weeks post discharge 2. Please obtain BMP to evaluate electrolytes and kidney function 3. Please also check CBC to evaluate Hg and Hct levels 4. Followup with ENT, Dr. Narda Bonds, regarding right maxillary sinus abnormality   Discharge Diagnoses:  Principal Problem:   Dizziness of unknown cause Active Problems:   DIABETES MELLITUS, TYPE II   HYPERTENSION   Acute renal failure   Leukocytopenia, unspecified   Thrombocytosis   AKI (acute kidney injury)   Orthostatic hypotension   Gait instability   Protein-calorie malnutrition, severe Gait instability/ataxia/dizziness/vertigo  -Likely multifactorial including but not limited to dehydration, medications, renal failure  -Patient had positive orthostatics with a 19 mmHg drop from sitting to standing  -Discontinue gabapentin, hydrochlorothiazide both of which can cause dizziness and vertigo - He will followup with his primary care physician regarding the need to restart these medications; patient was instructed not to restart gabapentin or HCTZ medications until he follows up with his primary care provider -Decrease metoprolol tartrate dosing--> patient blood pressure improved with IV fluids--> patient's metoprolol was changed back to his usual home dose at the time of discharge -MRI brain negative for acute stroke, bleeding, mass -Echocardiogram--EF 55-60%, grade 1 diastolic dysfunction, no wall motion abnormality -Cycle troponins-neg  -EKG is reassuring  -With IV fluids and physical therapy, the patient's gait instability and dizziness improved throughout the hospitalization. -He remained a little weak; therefore, home health physical therapy and occupational therapy was  recommended and set up prior to the patient's discharge -Carotid ultrasound was negative Orthostatic hypotension  -IV fluids  Acute kidney injury  -Likely volume depletion in conjunction with the patient's losartan hydrochlorothiazide -Losartan and HCTZ were discontinued during hospitalization -The patient was instructed to followup with his primary care provider regarding future need to restart these medications--losartan hydrochlorothiazide -Serum creatinine was 1.90 on the day of admission -IV fluids--> serum creatinine improved to 1.25 on the day of discharge -Check CPK--22 Right maxillary sinus density  -MRI brain showed a 1 cm right maxillary sinus T1+T2 hyperintense spherical density -Possible mycetoma  -Case was discussed with ENT, Dr. Danae Chen acute intervention is necessary  -follow up appointment was set up with Dr. Ezzard Standing for Tuesday, 06/23/12 at 3pm  Herpes zoster dermatitis  -Patient stated that initial lesions first occurred over 2-1/2 weeks ago -Localized infection on the patient's right chest and right shoulder blade area -Patient was initially started on intravenous acyclovir on the day of admission -All of his lesions have involuted  -Discontinue IV acyclovir on hospital day #2 and observed--> remained clinically stated Diabetes mellitus type 2  -Hemoglobin A1C= 5.5  -Patient has been mildly hypoglycemic likely due to a combination of his Amaryl with renal failure  -Discontinue Amaryl, metformin due to the patient's renal insufficiency, AKI -He will need to follow up with his primary care provider regarding restarting these medications -The patient was instructed not to restart his medications until he follows up with his primary care provider Elevated TSH  -Check free T4--1.18 -This may be euthyroid sick syndrome versus subclinical hypothyroidism -The patient's TSH will need to be rechecked in 4 weeks when the patient is clinically stable Iron deficiency  anemia  -Iron saturation 5%, ferritin 3 -Replace with IV iron x1-patient received a dose of Feraheme in  hospital -Start ferrous sulfate po bid Thrombocytosis  -due to the patient's iron deficiency anemia as well as stress reaction due to the patient's acute medical illness -Gradually improving with iron replacement Leukocytosis -Overall, the patient's leukocytosis did decrease from 16.8 on the day of admission to 14.2 on the day of discharge -The patient remained afebrile and hemodynamically stable without any tachycardia throughout the hospitalization -This will need outpatient followup with his primary care provider -Urinalysis did not suggest UTI and chest x-ray was negative for any infiltrates -This may also be related to the patient's right maxillary sinus abnormality  Family Communication: Son updated   Discharge Condition: stable  Disposition: home  Diet: heart healthy Wt Readings from Last 3 Encounters:  06/17/12 91.2 kg (201 lb 1 oz)  09/26/08 108.863 kg (240 lb)  07/04/08 109.317 kg (241 lb)    History of present illness:  79 year old male with a history of hypertension, diabetes mellitus with neuropathy, hyperlipidemia presented with one-day history of dizziness, lightheadedness, and unsteadiness on his feet. The patient states that he has been feeling dizzy for at least 2 months. However on the day of admission, he describes difficulty walking and lifting his legs up. He also described weakness and instability on his feet. The patient stated that he wasn't particularly dizzy. He denied any fevers, chills, chest discomfort, nausea, vomiting, diarrhea, headache, visual loss, dysarthria. He did not have any ear pain, tinnitus, hearing loss, or facial pain. In addition the patient's son said that the patient was a little confused.  The patient recently noted a shingles type rash approximately 2-1/2 weeks ago. He went to see his chiropractor who manipulated the patient's neck and  back. The patient was also prescribed Lysine. In emergency department, the patient was found to have leukocytosis with WBC 16.8 and thrombocytosis with platelets 966,000. He was also noted to have renal insufficiency with a serum creatinine 1.90. The patient was started on intravenous acyclovir. His losartan and metformin was initially discontinued. On the subsequent day,HCTZ, gabapentin were discontinued. His Amaryl was also discontinue when he was noted to have some mild hypoglycemia in the 60s. This was thought to be due to the patient's renal failure. The patient's CBGs improved without any further hypoglycemic episodes. Orthostatics were checked and the patient was found to be orthostatic positive. The patient was started on intravenous fluids. Physical therapy and occupational therapy were asked to help the patient. They recommended home physical therapy and occupational therapy which was approximately patient's discharge. Echocardiogram was performed and showed ejection fraction greater than 55%. Carotid ultrasound was negative. MRI of the brain did not show any acute infarction, bleeding, or mass. It did show a 1 cm structure in the right maxillary sinus there was hyperintense on T1 and T2 signal. ENT, Dr. Narda Bonds was called and recommended outpatient followup which was set up for Tuesday, 06/24/2011 at 3 PM. With fluid hydration, PT and OT, and correction of the patient's hypoglycemia, the patient's mentation improved and his dizziness and gait instability also improved. The patient was given a dose of IV iron for iron deficiency. He was started on ferrous sulfate 325 mg twice a day at the time of discharge.    Discharge Exam: Filed Vitals:   06/19/12 0412  BP: 148/74  Pulse: 64  Temp: 98 F (36.7 C)  Resp: 20   Filed Vitals:   06/18/12 1300 06/18/12 2130 06/19/12 0228 06/19/12 0412  BP: 121/61 142/58 137/63 148/74  Pulse: 75 62 63 64  Temp:  98.6 F (37 C) 98.6 F (37 C) 98.1 F  (36.7 C) 98 F (36.7 C)  TempSrc: Tympanic Oral Oral Oral  Resp: 20 20 16 20   Height:      Weight:      SpO2: 99% 97% 94% 95%   General: A&O x 3, NAD, pleasant, cooperative Cardiovascular: RRR, no rub, no gallop, no S3 Respiratory: CTAB, no wheeze, no rhonchi Abdomen:soft, nontender, nondistended, positive bowel sounds Extremities: No edema, No lymphangitis, no petechiae  Discharge Instructions      Discharge Orders   Future Orders Complete By Expires     Diet - low sodium heart healthy  As directed     Discharge instructions  As directed     Comments:      Follow up with ENT doctor, Dr. Narda Bonds, on Tuesday 06/23/12 at 3pm about sinus abnormality on MRI    Increase activity slowly  As directed         Medication List    STOP taking these medications       gabapentin 100 MG capsule  Commonly known as:  NEURONTIN     glimepiride 4 MG tablet  Commonly known as:  AMARYL     hydrochlorothiazide 12.5 MG capsule  Commonly known as:  MICROZIDE     losartan 100 MG tablet  Commonly known as:  COZAAR     Lysine 500 MG Tabs     metFORMIN 1000 MG tablet  Commonly known as:  GLUCOPHAGE      TAKE these medications       aspirin 325 MG tablet  Take 325 mg by mouth daily.     ferrous sulfate 325 (65 FE) MG tablet  Take 1 tablet (325 mg total) by mouth 2 (two) times daily with a meal.     Fish Oil 1200 MG Caps  Take 1 capsule by mouth 2 (two) times daily.     l-methylfolate-B6-B12 3-35-2 MG Tabs  Commonly known as:  METANX  Take 1 tablet by mouth 2 (two) times daily.     metoprolol 50 MG tablet  Commonly known as:  LOPRESSOR  Take 50 mg by mouth daily.     omeprazole 20 MG capsule  Commonly known as:  PRILOSEC  Take 20 mg by mouth daily.     simvastatin 40 MG tablet  Commonly known as:  ZOCOR  Take 40 mg by mouth every evening.     vitamin B-12 500 MCG tablet  Commonly known as:  CYANOCOBALAMIN  Take 500 mcg by mouth 2 (two) times daily.          The results of significant diagnostics from this hospitalization (including imaging, microbiology, ancillary and laboratory) are listed below for reference.    Significant Diagnostic Studies: Dg Chest 2 View  06/17/2012   *RADIOLOGY REPORT*  Clinical Data: Weakness.  CHEST - 2 VIEW  Comparison: 10/25/ 2007  Findings: Two views of the chest demonstrate clear lungs.  No evidence for airspace disease or pulmonary edema.  Heart and mediastinum are within normal limits.  Bony thorax is intact.  IMPRESSION: No acute cardiopulmonary disease.   Original Report Authenticated By: Richarda Overlie, M.D.   Ct Head Wo Contrast  06/17/2012   *RADIOLOGY REPORT*  Clinical Data: Dizziness, altered mental status  CT HEAD WITHOUT CONTRAST  Technique:  Contiguous axial images were obtained from the base of the skull through the vertex without contrast.  Comparison: None.  Findings: Diffuse brain atrophy noted with minor periventricular white  matter microvascular ischemic change.  Mild associated ventricular enlargement.  Cerebellar atrophy as well.  No acute intracranial hemorrhage, mass lesion, definite infarction, midline shift, herniation, or extra-axial fluid collection.  Gray-white matter differentiation maintained.  Cisterns are patent.  Symmetric orbits.  The mastoids are clear.  Mucosal thickening in the right maxillary sinus.  Other sinuses clear.  IMPRESSION: Atrophy and microvascular ischemic changes.  No acute finding by noncontrast CT.  Right maxillary sinus disease.   Original Report Authenticated By: Judie Petit. Miles Costain, M.D.   Mr Brain Wo Contrast  06/17/2012   *RADIOLOGY REPORT*  Clinical Data: Dizziness with altered mental status. Hypertension. Diabetes.  Hyperlipidemia.  MRI HEAD WITHOUT CONTRAST  Technique:  Multiplanar, multiecho pulse sequences of the brain and surrounding structures were obtained according to standard protocol without intravenous contrast.  Comparison: CT head earlier today.  Findings: There is  no evidence for acute infarction, intracranial hemorrhage, mass lesion, hydrocephalus, or extra-axial fluid. Moderate atrophy.  Mild to moderate periventricular greater than subcortical white matter signal abnormality, likely chronic microvascular ischemic change.  No foci of chronic hemorrhage.  No proximal vascular occlusion is evident.  Mild chronic sinus disease is evident, most notable in the right maxillary sinus.  There is a centimeter sized structure in the dependent right maxillary sinus, roughly spherical, with increased T1 and decreased T2 signal, possible mycetoma or fungal sinus ball. No expansion of the sinus.  Correlate clinically.  No midline abnormalities.  No acute osseous findings.  Left cataract extraction.  Compared with prior CT, there is good general agreement.  IMPRESSION: Chronic changes of moderate atrophy and mild to moderate chronic microvascular ischemic change.  No visible acute intracranial abnormality.  No evidence for brainstem or posterior fossa infarct.  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   Original Report Authenticated By: Davonna Belling, M.D.     Microbiology: No results found for this or any previous visit (from the past 240 hour(s)).   Labs: Basic Metabolic Panel:  Recent Labs Lab 06/17/12 1318 06/18/12 0506 06/19/12 0530  NA 134* 137 136  K 4.8 4.1 3.9  CL 99 102 102  CO2 17* 24 25  GLUCOSE 137* 138* 147*  BUN 34* 31* 20  CREATININE 1.90* 1.90* 1.25  CALCIUM 10.0 9.3 9.2   Liver Function Tests:  Recent Labs Lab 06/17/12 1318  AST 19  ALT 16  ALKPHOS 46  BILITOT 0.2*  PROT 7.3  ALBUMIN 3.5   No results found for this basename: LIPASE, AMYLASE,  in the last 168 hours No results found for this basename: AMMONIA,  in the last 168 hours CBC:  Recent Labs Lab 06/17/12 1318 06/18/12 0506 06/19/12 0530  WBC 16.8* 13.2* 14.2*  NEUTROABS 13.3*  --   --   HGB 12.6* 11.3* 11.2*  HCT 41.0 36.8* 36.9*  MCV 70.9* 69.8*  70.3*  PLT 966* 815* 766*   Cardiac Enzymes:  Recent Labs Lab 06/17/12 1318 06/18/12 0800 06/18/12 1427  CKTOTAL  --   --  22  TROPONINI <0.30 <0.30  --    BNP: No components found with this basename: POCBNP,  CBG:  Recent Labs Lab 06/18/12 1157 06/18/12 1708 06/18/12 2127 06/19/12 0804 06/19/12 1154  GLUCAP 175* 189* 243* 203* 179*    Time coordinating discharge:  Greater than 30 minutes  Signed:  Lima Chillemi, DO [facility] Pager: [phone] 06/19/2012, 2:00 PM

## 2012-06-19 NOTE — Progress Notes (Signed)
Physical Therapy Treatment Patient Details Name: FRANKI STEMEN MRN: [id number] DOB: 1932-05-14 Today's Date: 06/19/2012 Time: 4098-1191 PT Time Calculation (min): 26 min  PT Assessment / Plan / Recommendation Comments on Treatment Session  Pt ambulated in hallway and performed a few exercises in recliner.  Pt fatigues quickly and will need HHPT for improving strength and activity tolerance.    Follow Up Recommendations  Home health PT     Does the patient have the potential to tolerate intense rehabilitation     Barriers to Discharge        Equipment Recommendations  None recommended by PT    Recommendations for Other Services    Frequency     Plan Discharge plan remains appropriate;Frequency remains appropriate    Precautions / Restrictions Precautions Precautions: Fall   Pertinent Vitals/Pain Reports pain in area of recent shingles however did not want medication (will use call if changes mind) Pt reported feeling "drunk" in the head during ambulation but denied dizziness/lightheadedness    Mobility  Bed Mobility Bed Mobility: Not assessed Transfers Transfers: Sit to Stand;Stand to Sit Sit to Stand: 4: Min guard;With upper extremity assist;From chair/3-in-1;From toilet Stand to Sit: 4: Min guard;With upper extremity assist;To chair/3-in-1 Details for Transfer Assistance: no cues needed, min/guard for safety due to admitted for dizziness Ambulation/Gait Ambulation/Gait Assistance: 4: Min guard Ambulation Distance (Feet): 70 Feet (x2) Assistive device: None Ambulation/Gait Assistance Details: pt reports very weak legs today and required seated rest break, verbal cues for safe use of RW and to use UEs to assist fatigue of LEs Gait Pattern: Step-through pattern;Narrow base of support General Gait Details: bil toe out, knees buckling x2 but no assist needed    Exercises General Exercises - Lower Extremity Ankle Circles/Pumps: AROM;20 reps;Both Quad Sets: AROM;Both;10  reps Long Arc Quad: AROM;Both;15 reps;Seated Hip ABduction/ADduction: AROM;5 reps;Both Straight Leg Raises: AROM;Both;5 reps Hip Flexion/Marching: AROM;Both;15 reps;Seated   PT Diagnosis:    PT Problem List:   PT Treatment Interventions:     PT Goals Acute Rehab PT Goals PT Goal: Sit to Stand - Progress: Progressing toward goal PT Goal: Stand to Sit - Progress: Progressing toward goal PT Goal: Ambulate - Progress: Progressing toward goal PT Goal: Perform Home Exercise Program - Progress: Progressing toward goal  Visit Information  Last PT Received On: 06/19/12 Assistance Needed: +1    Subjective Data  Subjective: hey Kati! (pt remembered therapist from yesterday) Patient Stated Goal: home, get stronger   Cognition  Cognition Arousal/Alertness: Awake/alert Behavior During Therapy: WFL for tasks assessed/performed Overall Cognitive Status: Within Functional Limits for tasks assessed    Balance     End of Session PT - End of Session Activity Tolerance: Patient limited by fatigue Patient left: in chair;with call bell/phone within reach   GP     Zia Najera,KATHrine E 06/19/2012, 10:52 AM Zenovia Jarred, PT, DPT 06/19/2012 Pager: [phone]

## 2012-07-20 ENCOUNTER — Encounter: Payer: Self-pay | Admitting: *Deleted

## 2012-07-20 ENCOUNTER — Encounter: Payer: Self-pay | Admitting: Cardiovascular Disease

## 2012-07-21 ENCOUNTER — Ambulatory Visit (INDEPENDENT_AMBULATORY_CARE_PROVIDER_SITE_OTHER): Payer: Medicare Other | Admitting: Physician Assistant

## 2012-07-21 ENCOUNTER — Encounter: Payer: Self-pay | Admitting: Physician Assistant

## 2012-07-21 VITALS — BP 150/90 | HR 62 | Ht 72.0 in | Wt 202.6 lb

## 2012-07-21 DIAGNOSIS — I10 Essential (primary) hypertension: Secondary | ICD-10-CM

## 2012-07-21 DIAGNOSIS — I2510 Atherosclerotic heart disease of native coronary artery without angina pectoris: Secondary | ICD-10-CM

## 2012-07-21 MED ORDER — METOPROLOL TARTRATE 50 MG PO TABS: 50.0000 mg | ORAL_TABLET | Freq: Two times a day (BID) | ORAL | Status: DC

## 2012-07-21 NOTE — Patient Instructions (Addendum)
Start taking Lopressor twice a day. Followup with Dr. Allyson Sabal in 6 months.

## 2012-07-21 NOTE — Progress Notes (Signed)
Date:  07/21/2012   ID:  [name], DOB June 19, 1932, MRN [id number]  PCP:  Rogelia Boga, MD  Primary Cardiologist: berry     History of Present Illness: [name] is a 79 y.o. male history coronary artery disease status post LAD and RCA stenting in the past. His last catheterization by Dr. Bjorn Pippin 2007 was found to have patent stents normal LV function. He also has hypertension, hyperlipidemia, iron deficiency anemia, subclinical hypothyroidism, and diabetes. He does not smoke or drink.  His last 2-D echocardiogram was 06/18/2012 reveal revealed normal ejection fraction, mild LVH, normal wall motion, grade 1 diastolic dysfunction, left atrium is mildly dilated, normal right ventricle systolic function.  Patient was just hospitalized 06/17/2012 A. 06/19/2012 secondary to dizziness with associated gait instability, ataxia. Cystoscopy multifactorial including dehydration medications and renal failure. He was orthostatic with 19 mm drop in pressure from sitting to standing and subsequently treated with IV fluids. His HCTZ was discontinued and metoprolol was decreased.  He also recently was diagnosed with shingles.    Patient appears to be doing better since his discharge. He is using a cane to walk but is getting stronger.  After discussing his medications he found he is taking his Lopressor only once daily. The patient currently denies nausea, vomiting, fever, chest pain, shortness of breath, orthopnea, dizziness, PND, cough, congestion, abdominal pain, hematochezia, melena, lower extremity edema.   Wt Readings from Last 3 Encounters:  07/21/12 202 lb 9.6 oz (91.899 kg)  06/17/12 201 lb 1 oz (91.2 kg)  09/26/08 240 lb (108.863 kg)     Past Medical History  Diagnosis Date  . Diabetes mellitus without complication   . Coronary artery disease   . Hypertension   . Hyperlipidemia     Current Outpatient Prescriptions  Medication Sig Dispense Refill  . aspirin 325 MG  tablet Take 325 mg by mouth daily.      . DULoxetine (CYMBALTA) 30 MG capsule Take 30 mg by mouth 2 (two) times daily.      . ferrous sulfate 325 (65 FE) MG tablet Take 1 tablet (325 mg total) by mouth 2 (two) times daily with a meal.  60 tablet  3  . glimepiride (AMARYL) 4 MG tablet Take 4 mg by mouth daily before breakfast.      . l-methylfolate-B6-B12 (METANX) 3-35-2 MG TABS Take 1 tablet by mouth 2 (two) times daily.      Marland Kitchen losartan (COZAAR) 100 MG tablet Take 100 mg by mouth daily.      . metoprolol (LOPRESSOR) 50 MG tablet Take 1 tablet (50 mg total) by mouth 2 (two) times daily.  60 tablet  5  . Omega-3 Fatty Acids (FISH OIL) 1200 MG CAPS Take 1 capsule by mouth 2 (two) times daily.      Marland Kitchen omeprazole (PRILOSEC) 20 MG capsule Take 20 mg by mouth daily.      . simvastatin (ZOCOR) 40 MG tablet Take 40 mg by mouth every evening.      . vitamin B-12 (CYANOCOBALAMIN) 500 MCG tablet Take 500 mcg by mouth 2 (two) times daily.       No current facility-administered medications for this visit.    Allergies:   No Known Allergies  Social History:  The patient  reports that he has quit smoking. His smoking use included Cigarettes. He smoked 0.00 packs per day. He has never used smokeless tobacco. He reports that he does not drink alcohol or use illicit drugs.  Family history:  No family history on file.  ROS:  Please see the history of present illness.  All other systems reviewed and negative.   PHYSICAL EXAM: VS:  BP 150/90  Pulse 62  Ht 6' (1.829 m)  Wt 202 lb 9.6 oz (91.899 kg)  BMI 27.47 kg/m2 Well nourished, well developed, in no acute distress HEENT: Pupils are equal round react to light accommodation extraocular movements are intact.  Neck: no JVDNo cervical lymphadenopathy. Cardiac: Regular rate and rhythm without murmurs rubs or gallops. Lungs:  clear to auscultation bilaterally, no wheezing, rhonchi or rales Abd: soft, nontender, positive bowel sounds all quadrants, no  hepatosplenomegaly Ext: no lower extremity edema.  2+ radial and dorsalis pedis pulses. Skin: warm and dry Neuro:  Grossly normal  EKG:  Normal sinus rhythm rate 62 beats per minute   ASSESSMENT AND PLAN:  Problem List Items Addressed This Visit   HYPERTENSION     The patient will start taking Lopressor twice daily as his blood pressure is elevated today.  Followup with Dr. Allyson Sabal in 6 months.     Relevant Medications      metoprolol (LOPRESSOR) tablet    Other Visit Diagnoses   CAD (coronary artery disease)    -  Primary    Relevant Medications       metoprolol (LOPRESSOR) tablet    Other Relevant Orders       EKG 12-Lead

## 2012-07-21 NOTE — Assessment & Plan Note (Addendum)
The patient will start taking Lopressor twice daily as his blood pressure is elevated today.  Followup with Dr. Allyson Sabal in 6 months.

## 2012-08-31 ENCOUNTER — Other Ambulatory Visit (HOSPITAL_COMMUNITY): Payer: Self-pay | Admitting: Internal Medicine

## 2012-09-02 ENCOUNTER — Encounter: Payer: Self-pay | Admitting: Neurology

## 2012-09-02 ENCOUNTER — Ambulatory Visit (INDEPENDENT_AMBULATORY_CARE_PROVIDER_SITE_OTHER): Payer: Medicare Other | Admitting: Neurology

## 2012-09-02 VITALS — BP 155/77 | HR 63 | Ht 72.0 in | Wt 199.0 lb

## 2012-09-02 DIAGNOSIS — E1142 Type 2 diabetes mellitus with diabetic polyneuropathy: Secondary | ICD-10-CM

## 2012-09-02 DIAGNOSIS — E1149 Type 2 diabetes mellitus with other diabetic neurological complication: Secondary | ICD-10-CM

## 2012-09-02 DIAGNOSIS — E1140 Type 2 diabetes mellitus with diabetic neuropathy, unspecified: Secondary | ICD-10-CM

## 2012-09-02 DIAGNOSIS — E118 Type 2 diabetes mellitus with unspecified complications: Secondary | ICD-10-CM | POA: Insufficient documentation

## 2012-09-02 NOTE — Patient Instructions (Addendum)
Plan check MRI scan of the cervical, thoracic and lumbar spine or compressive myelopathy and EMG nerve conduction study as well as neuropathic panel labs. Refer to physical therapy for gait and balance training. Return for followup in 6 weeks .

## 2012-09-02 NOTE — Progress Notes (Signed)
[facility] [address] [place]. [place] [address] [phone]       OFFICE CONSULT NOTE  [name] Date of Birth:  12-06-1932 Medical Record Number:  [id number]   Referring MD:  Pearson Grippe  Reason for Referral:  Falls and balance  HPI: Mr [name] is a 77 year pleasant Caucasian male who has had increasing balance and gait difficulties with frequent falls since last 1 year. Since the last 3 months particularly he at least 5 falls. 3 or 4 of them have occurred in the last 1 month itself. She stayed he states that these are mainly related to early strength is down on a chair. He loses his balance. However follow: He was in the mailbox and walking down his driveway and could not stop himself and fell forward.. He has noticed that he is walking with more of a shuffling gait 6 is still pressured. He however seems to do better when he slows down and walks more difficulty. He denies any tremors of his hands, drooling of saliva significant change in his handwriting micrographia. He feels his truncal balance is poor and when is sitting on the edge of the bed he often tends to fall to one side. He denies significant neck pain or medical pain though occasionally he'll notice pain in his spine in the midthoracic region. There is a remote history of a herniated disc in his low back and underwentdisc removal at L5 in 1978. He had episode of shingles in June this year and had many skin lesions which seem to have healed. He's noticed increasing difficulties and walking and balance since this. He has also noticed that his stamina has gone down and he tires more easily. He is currently being evaluated by hematologist Dr Allison Quarry in [facility] and for elevated platelet count of 865,000 with normal hematocrit and slightly elevated white count. Did not have any of those results for my review today. He does have long-standing history of diabetes which he says is quite well controlled and his  last hemoglobin A1c was 5.8 on 06/23/12. Denies significant pain or paresthesias in his feet. He MRI scan of the brain done on 06/17/12 which I have personally reviewed shows mild generalized cerebral atrophy changes of chronic microvascular ischemia without any acute abnormality. CBC and basic metabolic panel labs done on 06/23/12 were normal ROS:   14 system review of systems is positive for  weight loss, fatigue, hearing loss, spinning sensation, shortness of breath, diarrhea, impotence, flushing, joint pain, aching muscles, headache, weakness, dizziness, decreased energy and appetite.  PMH:  Past Medical History  Diagnosis Date  . Diabetes mellitus without complication   . Coronary artery disease   . Hypertension   . Hyperlipidemia     Social History:  History   Social History  . Marital Status: Married    Spouse Name: N/A    Number of Children: 4  . Years of Education: 12   Occupational History  . Not on file.   Social History Main Topics  . Smoking status: Former Smoker    Types: Cigarettes  . Smokeless tobacco: Never Used  . Alcohol Use: No  . Drug Use: No  . Sexual Activity: No   Other Topics Concern  . Not on file   Social History Narrative  . No narrative on file    Medications:   Current Outpatient Prescriptions on File Prior to Visit  Medication Sig Dispense Refill  . aspirin 325 MG tablet Take  325 mg by mouth daily.      . DULoxetine (CYMBALTA) 30 MG capsule Take 30 mg by mouth 2 (two) times daily.      Marland Kitchen glimepiride (AMARYL) 4 MG tablet Take 4 mg by mouth daily before breakfast.      . l-methylfolate-B6-B12 (METANX) 3-35-2 MG TABS Take 1 tablet by mouth 2 (two) times daily.      Marland Kitchen losartan (COZAAR) 100 MG tablet Take 100 mg by mouth daily.      . metoprolol (LOPRESSOR) 50 MG tablet Take 1 tablet (50 mg total) by mouth 2 (two) times daily.  60 tablet  5  . Omega-3 Fatty Acids (FISH OIL) 1200 MG CAPS Take 1 capsule by mouth 2 (two) times daily.      Marland Kitchen  omeprazole (PRILOSEC) 20 MG capsule Take 20 mg by mouth daily.      . simvastatin (ZOCOR) 40 MG tablet Take 40 mg by mouth every evening.      . vitamin B-12 (CYANOCOBALAMIN) 500 MCG tablet Take 500 mcg by mouth 2 (two) times daily.       No current facility-administered medications on file prior to visit.    Allergies:  No Known Allergies  Physical Exam General: well developed, well nourished, seated, in no evident distress Head: head normocephalic and atraumatic. Orohparynx benign Neck: supple with no carotid or supraclavicular bruits Cardiovascular: regular rate and rhythm, no murmurs Musculoskeletal: no deformity Skin:  no rash/petichiae. No zoster lesions on back noted. Vascular:  Normal pulses all extremities Filed Vitals:   09/02/12 1259  BP: 155/77  Pulse: 63    Neurologic Exam Mental Status: Awake and fully alert. Oriented to place and time. Recent and remote memory intact. Attention span, concentration and fund of knowledge appropriate. Mood and affect appropriate. Recall 2/3. Animal naming test 13 Cranial Nerves: Fundoscopic exam reveals sharp disc margins. Pupils equal, briskly reactive to light. Extraocular movements full without nystagmus. Visual fields full to confrontation. Hearing intact. Facial sensation intact. Face, tongue, palate moves normally and symmetrically.  Motor: Normal bulk and tone. Normal strength in all tested extremity muscles. Mild weakness left dorsiflexors bilaterally Sensory.:diminished touch and pinprick sensation on the foot bilaterally and vibration and position sensation over the toes bilaterally.  Coordination: Rapid alternating movements normal in all extremities. Finger-to-nose and heel-to-shin performed accurately bilaterally. Gait and Station: Arises from chair with some difficulty. Stance is broad based and mildly stoooped. Gait demonstrates normal stride length  No festination or freezing or diminished arm swing. Positive Rhomberg`s and  poor truncal balance and postural response to threat . Unable to heel, toe and tandem walk without difficulty.  Reflexes: 1+ and symmetric except ankle jerks are absent bilaterally. Toes downgoing.       ASSESSMENT: 9 year Caucasian male with a one-year history of progressive gait and balance difficulties with increasing falls of undetermined etiology likely from underlying peripheral neuropathy no other etiologies like compressive or zoster  myelopathy needs to be evaluated for as well.    PLAN: Plan check MRI scan of the cervical, thoracic and lumbar spine or compressive myelopathy and EMG nerve conduction study as well as neuropathic panel labs. Refer to physical therapy for gait and balance training. Return for followup in 6 weeks .

## 2012-09-03 DIAGNOSIS — M542 Cervicalgia: Secondary | ICD-10-CM | POA: Insufficient documentation

## 2012-09-04 LAB — VITAMIN B12: Vitamin B-12: >1999 pg/mL — ABNORMAL HIGH (ref 211–946)

## 2012-09-04 LAB — IFE AND PE, SERUM
Albumin SerPl Elph-Mcnc: 4 g/dL (ref 3.2–5.6)
Alpha 1: 0.2 g/dL (ref 0.1–0.4)
B-Globulin SerPl Elph-Mcnc: 0.8 g/dL (ref 0.6–1.3)
Gamma Glob SerPl Elph-Mcnc: 1.3 g/dL (ref 0.5–1.6)

## 2012-09-11 ENCOUNTER — Ambulatory Visit: Payer: Medicare Other | Admitting: Cardiovascular Disease

## 2012-09-12 ENCOUNTER — Ambulatory Visit
Admission: RE | Admit: 2012-09-12 | Discharge: 2012-09-12 | Disposition: A | Discharge status: Home / Self Care | Payer: Medicare Other | Source: Ambulatory Visit | Attending: Neurology | Admitting: Neurology

## 2012-09-12 DIAGNOSIS — E1140 Type 2 diabetes mellitus with diabetic neuropathy, unspecified: Secondary | ICD-10-CM

## 2012-09-12 DIAGNOSIS — R269 Unspecified abnormalities of gait and mobility: Secondary | ICD-10-CM

## 2012-09-12 DIAGNOSIS — E1149 Type 2 diabetes mellitus with other diabetic neurological complication: Secondary | ICD-10-CM

## 2012-09-15 ENCOUNTER — Ambulatory Visit: Payer: Medicare Other | Attending: Neurology | Admitting: Physical Therapy

## 2012-09-15 DIAGNOSIS — IMO0001 Reserved for inherently not codable concepts without codable children: Secondary | ICD-10-CM | POA: Insufficient documentation

## 2012-09-15 DIAGNOSIS — E1142 Type 2 diabetes mellitus with diabetic polyneuropathy: Secondary | ICD-10-CM | POA: Insufficient documentation

## 2012-09-15 DIAGNOSIS — R269 Unspecified abnormalities of gait and mobility: Secondary | ICD-10-CM | POA: Insufficient documentation

## 2012-09-15 DIAGNOSIS — E1149 Type 2 diabetes mellitus with other diabetic neurological complication: Secondary | ICD-10-CM | POA: Insufficient documentation

## 2012-09-15 DIAGNOSIS — Z9181 History of falling: Secondary | ICD-10-CM | POA: Insufficient documentation

## 2012-09-17 ENCOUNTER — Telehealth: Payer: Self-pay | Admitting: Internal Medicine

## 2012-09-17 NOTE — Telephone Encounter (Signed)
S/W PT AND GVE NP APPT 10/03 @ 2:30 W/DR. CHISM REFERRING DR. Trisha Mangle DX- JAK2 POSITIVE MYELOPROLIFERATIVE DISORDER WELCOME PACKET MAILE.D

## 2012-09-18 ENCOUNTER — Encounter (INDEPENDENT_AMBULATORY_CARE_PROVIDER_SITE_OTHER): Payer: Self-pay

## 2012-09-18 ENCOUNTER — Ambulatory Visit (INDEPENDENT_AMBULATORY_CARE_PROVIDER_SITE_OTHER): Payer: Medicare Other | Admitting: Neurology

## 2012-09-18 DIAGNOSIS — E1140 Type 2 diabetes mellitus with diabetic neuropathy, unspecified: Secondary | ICD-10-CM

## 2012-09-18 DIAGNOSIS — R42 Dizziness and giddiness: Secondary | ICD-10-CM

## 2012-09-18 DIAGNOSIS — Z0289 Encounter for other administrative examinations: Secondary | ICD-10-CM

## 2012-09-18 DIAGNOSIS — R269 Unspecified abnormalities of gait and mobility: Secondary | ICD-10-CM

## 2012-09-18 DIAGNOSIS — R2681 Unsteadiness on feet: Secondary | ICD-10-CM

## 2012-09-18 DIAGNOSIS — E1149 Type 2 diabetes mellitus with other diabetic neurological complication: Secondary | ICD-10-CM

## 2012-09-18 NOTE — Procedures (Signed)
    [facility]  NCS (NERVE CONDUCTION STUDY) WITH EMG (ELECTROMYOGRAPHY) REPORT   STUDY DATE:  09/18/2012 PATIENT NAME: [name] DOB: 10-07-1932 MRN: [id number]    TECHNOLOGIST: Gearldine Shown ELECTROMYOGRAPHER: Levert Feinstein M.D.  CLINICAL INFORMATION:   79 years old right-handed Caucasian male, with past medical history of diabetes, diabetic peripheral neuropathy, presenting with his 73 month history of progressive gait difficulty, bilateral lower extremity weakness, and numbness at bilateral toes, plantar surface this  On examination, he has mild-to-moderate bilaterally hip flexion weakness, there was no distal weakness, bilateral upper extremity motor strength was normal. He was able to get up from seated position arm crossed, mild cautious gait, deep tendon reflex, upper extremity and patellar 1/4, absent ankle reflexes, mild length dependent sensory changes  FINDINGS: NERVE CONDUCTION STUDY:  Bilateral peroneal sensory responses were absent. Bilateral tibial motor responses were normal. Bilateral peroneal to EDB motor responses have demonstrated mild to moderately decreased to CMAP amplitude, with normal distal latency, conduction velocity. Bilateral tibial H. reflexes were absent. Right median, ulnar sensory and motor responses were normal.  NEEDLE ELECTROMYOGRAPHY:  Selected needle examination was performed at left lower, upper extremity muscles, and left lumbosacral paraspinal muscles.  Left tibialis anterior: increased insertion activity, no spontaneous activity, mixture of normal mildly enlarged motor unit potential with slightly decreased recruitment pattern,,  Left tibialis posterior, medial gastrocnemius, vastus lateralis, peroneal longus, biceps femoris long head: normally insertion activity, no spontaneous activity, mildly enlarged motor unit potential mixed with normal morphology motor unit potential, with slightly decreased recruitment  patterns.    There was no spontaneous activity at left lumbosacral paraspinal muscles, left L4, L5, S1 .  Needle examination of left extensor digitorum communis, biceps, deltoid, triceps was normal.  IMPRESSION:   This is a mild abnormal study, there is electrodiagnostic evidence of mild length dependent axonal peripheral neuropathy, there was also slight chronic neuropathic changes involving left lumbosacral myotomes, L4, L5, S1, indicating slight left lumbosacral radiculopathy there is no evidence of active process there is no evidence of myopathic changes .       INTERPRETING PHYSICIAN:   Levert Feinstein M.D. Ph.D. [facility] [address] [place], [place] [address] [phone]

## 2012-09-25 ENCOUNTER — Ambulatory Visit: Payer: Medicare Other | Admitting: Physical Therapy

## 2012-09-25 NOTE — Progress Notes (Signed)
Quick Note:  Relayed to pt the results of NCS/EMG. Would fax to Dr. Selena Batten at [facility]. ______

## 2012-10-02 ENCOUNTER — Encounter: Payer: Self-pay | Admitting: Internal Medicine

## 2012-10-02 ENCOUNTER — Ambulatory Visit (INDEPENDENT_AMBULATORY_CARE_PROVIDER_SITE_OTHER): Payer: Medicare Other | Admitting: Cardiovascular Disease

## 2012-10-02 ENCOUNTER — Encounter: Payer: Self-pay | Admitting: Cardiovascular Disease

## 2012-10-02 ENCOUNTER — Ambulatory Visit: Payer: Medicare Other | Admitting: Rehabilitative and Restorative Service Providers"

## 2012-10-02 VITALS — BP 130/76 | HR 57 | Ht 72.0 in | Wt 196.0 lb

## 2012-10-02 DIAGNOSIS — I2510 Atherosclerotic heart disease of native coronary artery without angina pectoris: Secondary | ICD-10-CM

## 2012-10-02 DIAGNOSIS — R0602 Shortness of breath: Secondary | ICD-10-CM

## 2012-10-02 NOTE — Patient Instructions (Addendum)
  We will see you back in follow up after the tests  Dr Allyson Sabal has ordered an echocardiogram and a myoview stress test (lexiscan)

## 2012-10-02 NOTE — Assessment & Plan Note (Signed)
History of CAD status post LAD and RCA stenting in the past by myself. He was catheterized by Dr. Howell Pringle in 2007 revealing patent stents with normal LV function. He developed chest pain and dyspnea on exertion as well as orthopnea recently. I'm going to get a 2-D echo and elects in my view. He is unable to exercise because of frequent walls and peripheral neuropathy.

## 2012-10-02 NOTE — Progress Notes (Signed)
10/02/2012 [name]   23-Nov-1932  [id number]  Primary Physician [name] Grippe, MD Primary Cardiologist: [name] Gess MD [name]   HPI:  The patient is a very pleasant 79 year old mildly overweight widowed Caucasian male (wife died 26-Aug-2010 at age 44), father of 3, grandfather to 6 grandchildren who I last saw 6 months ago. He has a history of CAD status post LAD and RCA stenting in the past. He was last catheterized by Dr. Elsie [name] in 2007 and found to have patent stents with normal LV function. His other problems include hypertension, hyperlipidemia and diabetes. He does not smoke or drink. His most recent lipid profile performed by Dr. Selena [name] in June of last year revealed a total cholesterol of 128, LDL of 67, and HDL of 35 Since I saw him 04/16/79 he has developed recurrent chest pain. He's also had shingles in the interim. He saw Dr. [name] in the office in referred him here for further evaluation and stress testing. He also has been complaining of dyspnea on exertion and orthopnea which is a new finding as well.    Current Outpatient Prescriptions  Medication Sig Dispense Refill  . aspirin 325 MG tablet Take 325 mg by mouth daily.      . DULoxetine (CYMBALTA) 30 MG capsule Take 30 mg by mouth 2 (two) times daily.      Marland Kitchen gabapentin (NEURONTIN) 100 MG capsule Take 100 mg by mouth 2 (two) times daily.      Marland Kitchen glimepiride (AMARYL) 4 MG tablet Take 4 mg by mouth daily before breakfast.      . l-methylfolate-B6-B12 (METANX) 3-35-2 MG TABS Take 1 tablet by mouth 2 (two) times daily.      Marland Kitchen losartan (COZAAR) 100 MG tablet Take 100 mg by mouth daily.      . metoprolol (LOPRESSOR) 50 MG tablet Take 1 tablet (50 mg total) by mouth 2 (two) times daily.  60 tablet  5  . Omega-3 Fatty Acids (FISH OIL) 1200 MG CAPS Take 1 capsule by mouth 2 (two) times daily.      Marland Kitchen omeprazole (PRILOSEC) 20 MG capsule Take 20 mg by mouth daily.      . simvastatin (ZOCOR) 40 MG tablet Take 40  mg by mouth every evening.       No current facility-administered medications for this visit.    No Known Allergies  History   Social History  . Marital Status: Married    Spouse Name: N/A    Number of Children: 4  . Years of Education: 12   Occupational History  . Not on file.   Social History Main Topics  . Smoking status: Former Smoker    Types: Cigarettes  . Smokeless tobacco: Never Used  . Alcohol Use: No  . Drug Use: No  . Sexual Activity: No   Other Topics Concern  . Not on file   Social History Narrative  . No narrative on file     Review of Systems: General: negative for chills, fever, night sweats or weight changes.  Cardiovascular: negative for chest pain, dyspnea on exertion, edema, orthopnea, palpitations, paroxysmal nocturnal dyspnea or shortness of breath Dermatological: negative for rash Respiratory: negative for cough or wheezing Urologic: negative for hematuria Abdominal: negative for nausea, vomiting, diarrhea, bright red blood per rectum, melena, or hematemesis Neurologic: negative for visual changes, syncope, or dizziness All other systems reviewed and are otherwise negative except as noted above.    Blood pressure 130/76,  pulse 57, height 6' (1.829 m), weight 196 lb (88.905 kg).  General appearance: alert and no distress Neck: no adenopathy, no carotid bruit, no JVD, supple, symmetrical, trachea midline and thyroid not enlarged, symmetric, no tenderness/mass/nodules Lungs: clear to auscultation bilaterally Heart: regular rate and rhythm, S1, S2 normal, no murmur, click, rub or gallop Extremities: extremities normal, atraumatic, no cyanosis or edema  EKG not performed today  ASSESSMENT AND PLAN:   CORONARY ARTERY DISEASE History of CAD status post LAD and RCA stenting in the past by myself. He was catheterized by [name] in 2007 revealing patent stents with normal LV function. He developed chest pain and dyspnea on exertion as  well as orthopnea recently. I'm going to get a 2-D echo and elects in my view. He is unable to exercise because of frequent walls and peripheral neuropathy.      [name] Gess MD FACP,FACC,FAHA, [facility] 10/02/2012 12:35 PM

## 2012-10-08 ENCOUNTER — Other Ambulatory Visit: Payer: Self-pay | Admitting: Internal Medicine

## 2012-10-08 DIAGNOSIS — D473 Essential (hemorrhagic) thrombocythemia: Secondary | ICD-10-CM

## 2012-10-08 DIAGNOSIS — D72819 Decreased white blood cell count, unspecified: Secondary | ICD-10-CM

## 2012-10-08 DIAGNOSIS — D75839 Thrombocytosis, unspecified: Secondary | ICD-10-CM

## 2012-10-08 DIAGNOSIS — D751 Secondary polycythemia: Secondary | ICD-10-CM

## 2012-10-09 ENCOUNTER — Telehealth: Payer: Self-pay | Admitting: Internal Medicine

## 2012-10-09 ENCOUNTER — Telehealth: Payer: Self-pay | Admitting: Oncology

## 2012-10-09 ENCOUNTER — Ambulatory Visit (HOSPITAL_BASED_OUTPATIENT_CLINIC_OR_DEPARTMENT_OTHER): Payer: Medicare Other | Admitting: Internal Medicine

## 2012-10-09 ENCOUNTER — Encounter: Payer: Self-pay | Admitting: Internal Medicine

## 2012-10-09 ENCOUNTER — Ambulatory Visit: Payer: Medicare Other

## 2012-10-09 ENCOUNTER — Ambulatory Visit: Payer: Medicare Other | Attending: Neurology | Admitting: Rehabilitative and Restorative Service Providers"

## 2012-10-09 ENCOUNTER — Ambulatory Visit (HOSPITAL_BASED_OUTPATIENT_CLINIC_OR_DEPARTMENT_OTHER): Payer: Medicare Other | Admitting: Lab

## 2012-10-09 VITALS — BP 147/67 | HR 54 | Temp 97.6°F | Resp 17 | Ht 72.0 in | Wt 201.0 lb

## 2012-10-09 DIAGNOSIS — D47Z9 Other specified neoplasms of uncertain behavior of lymphoid, hematopoietic and related tissue: Secondary | ICD-10-CM

## 2012-10-09 DIAGNOSIS — D473 Essential (hemorrhagic) thrombocythemia: Secondary | ICD-10-CM

## 2012-10-09 DIAGNOSIS — D471 Chronic myeloproliferative disease: Secondary | ICD-10-CM | POA: Insufficient documentation

## 2012-10-09 DIAGNOSIS — Z9181 History of falling: Secondary | ICD-10-CM | POA: Insufficient documentation

## 2012-10-09 DIAGNOSIS — D751 Secondary polycythemia: Secondary | ICD-10-CM

## 2012-10-09 DIAGNOSIS — D75839 Thrombocytosis, unspecified: Secondary | ICD-10-CM

## 2012-10-09 DIAGNOSIS — D72819 Decreased white blood cell count, unspecified: Secondary | ICD-10-CM

## 2012-10-09 DIAGNOSIS — IMO0001 Reserved for inherently not codable concepts without codable children: Secondary | ICD-10-CM | POA: Insufficient documentation

## 2012-10-09 DIAGNOSIS — E1142 Type 2 diabetes mellitus with diabetic polyneuropathy: Secondary | ICD-10-CM | POA: Insufficient documentation

## 2012-10-09 DIAGNOSIS — R269 Unspecified abnormalities of gait and mobility: Secondary | ICD-10-CM | POA: Insufficient documentation

## 2012-10-09 DIAGNOSIS — E1149 Type 2 diabetes mellitus with other diabetic neurological complication: Secondary | ICD-10-CM | POA: Insufficient documentation

## 2012-10-09 LAB — COMPREHENSIVE METABOLIC PANEL (CC13)
ALT: 24 U/L (ref 0–55)
AST: 19 U/L (ref 5–34)
Albumin: 3.4 g/dL — ABNORMAL LOW (ref 3.5–5.0)
Alkaline Phosphatase: 66 U/L (ref 40–150)
BUN: 26.6 mg/dL — ABNORMAL HIGH (ref 7.0–26.0)
CO2: 27 mEq/L (ref 22–29)
Calcium: 9.5 mg/dL (ref 8.4–10.4)
Chloride: 106 mEq/L (ref 98–109)
Glucose: 124 mg/dl (ref 70–140)
Potassium: 4.6 mEq/L (ref 3.5–5.1)
Sodium: 140 mEq/L (ref 136–145)
Total Protein: 7.2 g/dL (ref 6.4–8.3)

## 2012-10-09 LAB — CBC WITH DIFFERENTIAL/PLATELET
BASO%: 1.2 % (ref 0.0–2.0)
EOS%: 3.2 % (ref 0.0–7.0)
Eosinophils Absolute: 0.3 10*3/uL (ref 0.0–0.5)
MCH: 25.6 pg — ABNORMAL LOW (ref 27.2–33.4)
MCHC: 32.4 g/dL (ref 32.0–36.0)
MCV: 78.8 fL — ABNORMAL LOW (ref 79.3–98.0)
MONO%: 9.7 % (ref 0.0–14.0)
RBC: 6.23 10*6/uL — ABNORMAL HIGH (ref 4.20–5.82)
RDW: 15.8 % — ABNORMAL HIGH (ref 11.0–14.6)
lymph#: 2.7 10*3/uL (ref 0.9–3.3)

## 2012-10-09 NOTE — Telephone Encounter (Signed)
Gave pt appt for labs and MD, [name] regarding phlebotomy on 10/10

## 2012-10-09 NOTE — Patient Instructions (Signed)
Hydroxyurea capsules What is this medicine? HYDROXYUREA (hye drox ee yoor EE a) is a chemotherapy drug. It slows the growth of cancer cells. This medicine is used to treat certain leukemias, skin cancer, head and neck cancer, and advanced ovarian cancer. It is also used to control the painful crises of sickle cell anemia. This medicine may be used for other purposes; ask your health care provider or pharmacist if you have questions. What should I tell my health care provider before I take this medicine? They need to know if you have any of these conditions: -immune system problems -infection (especially a virus infection such as chickenpox, cold sores, or herpes) -kidney disease -low blood counts, like low white cell, platelet, or red cell counts -previous or ongoing radiation therapy -an unusual or allergic reaction to hydroxyurea, other chemotherapy, other medicines, foods, dyes, or preservatives -pregnant or trying to get pregnant -breast-feeding How should I use this medicine? Take this medicine by mouth with a glass of water. Follow the directions on the prescription label. Take your medicine at regular intervals. Do not take it more often than directed. Do not stop taking except on your doctor's advice. People who are not taking this medicine should not be exposed to it. Wash your hands before and after handling your bottle or medicine. Caregivers should wear disposable gloves if they must touch the bottle or medicine. Clean up any medicine powder that spills with a damp disposable towel and throw the towel away in a closed container, such as a plastic bag. Talk to your pediatrician regarding the use of this medicine in children. Special care may be needed. Patients over 77 years old may have a stronger reaction and need a smaller dose. Overdosage: If you think you have taken too much of this medicine contact a poison control center or emergency room at once. NOTE: This medicine is only for  you. Do not share this medicine with others. What if I miss a dose? If you miss a dose, take it as soon as you can. If it is almost time for your next dose, take only that dose. Do not take double or extra doses. What may interact with this medicine? -didanosine -other chemotherapy agents -stavudine -tenofovir -vaccines This list may not describe all possible interactions. Give your health care provider a list of all the medicines, herbs, non-prescription drugs, or dietary supplements you use. Also tell them if you smoke, drink alcohol, or use illegal drugs. Some items may interact with your medicine. What should I watch for while using this medicine? This drug may make you feel generally unwell. This is not uncommon, as chemotherapy can affect healthy cells as well as cancer cells. Report any side effects. Continue your course of treatment even though you feel ill unless your doctor tells you to stop. You will receive regular blood tests during your treatment. Call your doctor or health care professional for advice if you get a fever, chills or sore throat, or other symptoms of a cold or flu. Do not treat yourself. This drug decreases your body's ability to fight infections. Try to avoid being around people who are sick. This medicine may increase your risk to bruise or bleed. Call your doctor or health care professional if you notice any unusual bleeding. Be careful brushing and flossing your teeth or using a toothpick because you may get an infection or bleed more easily. If you have any dental work done, tell your dentist you are receiving this medicine. Avoid taking  products that contain aspirin, acetaminophen, ibuprofen, naproxen, or ketoprofen unless instructed by your doctor. These medicines may hide a fever. Do not become pregnant while taking this medicine. Women should inform their doctor if they wish to become pregnant or think they might be pregnant. There is a potential for serious side  effects to an unborn child. Men should inform their doctors if they wish to father a child. This medicine may lower sperm counts. Talk to your health care professional or pharmacist for more information. Do not breast-feed an infant while taking this medicine. What side effects may I notice from receiving this medicine? Side effects that you should report to your doctor or health care professional as soon as possible: -allergic reactions like skin rash, itching or hives, swelling of the face, lips, or tongue -low blood counts - this medicine may decrease the number of white blood cells, red blood cells and platelets. You may be at increased risk for infections and bleeding. -signs of infection - fever or chills, cough, sore throat, pain or difficulty passing urine -signs of decreased platelets or bleeding - bruising, pinpoint red spots on the skin, black, tarry stools, blood in the urine -signs of decreased red blood cells - unusually weak or tired, fainting spells, lightheadedness -breathing problems -burning, redness or pain at the site of any radiation therapy -changes in skin color -confusion -mouth sores -pain, tingling, numbness in the hands or feet -seizures -skin ulcers -trouble passing urine or change in the amount of urine -vomiting Side effects that usually do not require medical attention (report to your doctor or health care professional if they continue or are bothersome): -headache -loss of appetite -red color to the face This list may not describe all possible side effects. Call your doctor for medical advice about side effects. You may report side effects to FDA at 1-800-FDA-1088. Where should I keep my medicine? Keep out of the reach of children. Store at room temperature between 15 and 30 degrees C (59 and 86 degrees F). Keep tightly closed. Throw away any unused medicine after the expiration date. NOTE: This sheet is a summary. It may not cover all possible information.  If you have questions about this medicine, talk to your doctor, pharmacist, or health care provider.  2013, [employer]. (05/08/2007 3:03:29 PM) Therapeutic Phlebotomy Therapeutic phlebotomy is the controlled removal of blood from your body for the purpose of treating a medical condition. It is similar to donating blood. Usually, about a pint (470 mL) of blood is removed. The average adult has 9 to 12 pints (4.3 to 5.7 L) of blood. Therapeutic phlebotomy may be used to treat the following medical conditions:  Hemochromatosis. This is a condition in which there is too much iron in the blood.  Polycythemia vera. This is a condition in which there are too many red cells in the blood.  Porphyria cutanea tarda. This is a disease usually passed from one generation to the next (inherited). It is a condition in which an important part of hemoglobin is not made properly. This results in the build up of abnormal amounts of porphyrins in the body.  Sickle cell disease. This is an inherited disease. It is a condition in which the red blood cells form an abnormal crescent shape rather than a round shape. LET YOUR CAREGIVER KNOW ABOUT:  Allergies.  Medicines taken including herbs, eyedrops, over-the-counter medicines, and creams.  Use of steroids (by mouth or creams).  Previous problems with anesthetics or numbing medicine.  History of blood clots.  History of bleeding or blood problems.  Previous surgery.  Possibility of pregnancy, if this applies. RISKS AND COMPLICATIONS This is a simple and safe procedure. Problems are unlikely. However, problems can occur and may include:  Nausea or lightheadedness.  Low blood pressure.  Soreness, bleeding, swelling, or bruising at the needle insertion site.  Infection. BEFORE THE PROCEDURE  This is a procedure that can be done as an outpatient. Confirm the time that you need to arrive for your procedure. Confirm whether there is a need to  fast or withhold any medications. It is helpful to wear clothing with sleeves that can be raised above the elbow. A blood sample may be done to determine the amount of red blood cells or iron in your blood. Plan ahead of time to have someone drive you home after the procedure. PROCEDURE The entire procedure from preparation through recovery takes about 1 hour. The actual collection takes about 10 to 15 minutes.  A needle will be inserted into your vein.  Tubing and a collection bag will be attached to that needle.  Blood will flow through the needle and tubing into the collection bag.  You may be asked to open and close your hand slowly and continuously during the entire collection.  Once the specified amount of blood has been removed from your body, the collection bag and tubing will be clamped.  The needle will be removed.  Pressure will be held on the site of the needle insertion to stop the bleeding. Then a bandage will be placed over the needle insertion site. AFTER THE PROCEDURE  Your recovery will be assessed and monitored. If there are no problems, as an outpatient, you should be able to go home shortly after the procedure.  Document Released: 05/28/2010 Document Revised: 03/18/2011 Document Reviewed: 05/28/2010 [facility] Patient Information 2014 [place], [place]. Polycythemia Vera  Polycythemia Dwana Curd is a condition in which the body makes too many red blood cells and there is no known cause. The red blood cells (erythrocytes) are the cells which carry the oxygen in your blood stream to the cells of your body. Because of the increased red blood cells, the blood becomes thicker and does not circulate as well. It would be similar to your car having oil which is too thick so it cannot start and circulate as well. When the blood is too thick it often causes headaches and dizziness. It may also cause blood clots. Even though the blood clots easier, these patients bleed easier. The bleeding is  caused because the blood cells which help stop bleeding (platelets) do not function normally. It occurs in all age groups but is more common in the 68 to 62 year age range. TREATMENT  The treatment of polycythemia vera for many years has been blood removal (phlebotomy) which is similar to blood removal in a blood bank, however this blood is not used for donation. Hydroxyurea is used to supplement phlebotomy. Aspirin is commonly given to thin the blood as long as the patient does not have a problem with bleeding. Other drugs are used based on the progression of the disease. Document Released: 09/18/2000 Document Revised: 03/18/2011 Document Reviewed: 03/25/2008 [facility] Patient Information 2014 [place], [place].

## 2012-10-09 NOTE — Telephone Encounter (Signed)
s.w. pt and advised on lab before appts...pt ok adn aware

## 2012-10-09 NOTE — Progress Notes (Signed)
Checked in new pt with no financial concerns. °

## 2012-10-11 ENCOUNTER — Encounter: Payer: Self-pay | Admitting: Internal Medicine

## 2012-10-12 ENCOUNTER — Other Ambulatory Visit: Payer: Self-pay | Admitting: Internal Medicine

## 2012-10-12 ENCOUNTER — Telehealth: Payer: Self-pay | Admitting: *Deleted

## 2012-10-12 ENCOUNTER — Telehealth: Payer: Self-pay | Admitting: Internal Medicine

## 2012-10-12 DIAGNOSIS — N179 Acute kidney failure, unspecified: Secondary | ICD-10-CM

## 2012-10-12 NOTE — Telephone Encounter (Signed)
Talked to pt and gave him appt for 10/10 lab and Phlebotomy

## 2012-10-12 NOTE — Progress Notes (Signed)
[facility] [phone]   [phone]  NEW PATIENT EVALUATION   Name: [name] Date: 10/11/2012 MRN: [id number] DOB: Mar 18, 1932  PCP: [name] Grippe, MD   REFERRING PHYSICIAN: Massie [name]., MD  REASON FOR REFERRAL: Myeloproliferative Disorder (+JAK 2 positive)   HISTORY OF PRESENT ILLNESS:[name] is [name] 79 y.o. male who is being referred by Dr. Vanessa [name].  He was seen by Dr. Allison [name] of the  [facility] hematology/oncology in August for persistently elevated white cell count and platelet counts.  Today he is accompanied by his son [name]) and his grandson [name]).  [name] has lost about 25 pounds over the past several months with [name] decline in his health overall.  In June of this year, he began having weakness and falls.  He was admitted by his PCP (Dr. [name]) to [facility] hospital and scans including his abdomen and pelvis and MRI of his abdomen was negative (per the patient and his son).  He was persistently iron deficient and mildly anemic prompting further evaluation by Dr. Devona [name] in gastroenterology.  Dr. Conley [name] performed and upper and lower GI endoscopy and there was no evidence of bleeding source and no malignancy.  Non-malignant polyps were removed.    In July, there was noticeable elevation of his white cell count with [name] CBC on 07/21/2012 revealing WBC of 14.3, hemoglobin of 14.1, MV of 75.4 and [name] platelet count of 774.  There was evidence of neutrophilia, lymphocytosis and monocytosis.  Dr. Amaryllis [name] obtained [name] JAK2 mutational analysis which was positive.  Because his hemoglobin was not over 18 gram, Dr. Allison [name] did not label his condition as polycythemia vera.  Since he had abnormalities of all cell lines, he also did not label him as having essential thrombocytosis either.  He was recommended to start hydroxyurea and aspirin.  The goal of hydrea was to decrease his platelet count to less than 600,000 and for the phlebotomies to  decrease his hematocrit to 45.  He was referred to [place] to be closer to his hometown of [place], [place].   He reports earlier this June, he had an episode of shingles.  He also reports [name] sore right elbow and upper torso following his recent fall.  He notes that he gets tired easily.     PAST MEDICAL HISTORY:  has [name] past medical history of Diabetes mellitus without complication; Coronary artery disease; Hypertension; Hyperlipidemia; and Chest pain.     PAST SURGICAL HISTORY: Past Surgical History  Procedure Laterality Date  . Eye surgery    . Cardiac surgery    . Doppler echocardiography  06/18/2010    EF =>55%,LV normal  . Nm myocar perf wall motion  05/23/2008    EF 62% ,norm myocardial perfusion  . Cardiac catheterization  11/01/2005    patent stents with normal LV function  . Coronary angioplasty      post LAD and RCA stenting  . Cardiac catheterization  11/08/2003    cypher stent mid dominant right coronary lesion  . Holter monitor  11/08/2005   CURRENT MEDICATIONS: has [name] current medication list which includes the following prescription(s): aspirin, duloxetine, gabapentin, glimepiride, hydroxyurea, l-methylfolate-b6-b12, losartan, metoprolol, fish oil, omeprazole, and simvastatin.  ALLERGIES: Review of patient's allergies indicates no known allergies.  SOCIAL HISTORY:  reports that he has quit smoking. His smoking use included Cigarettes. He smoked 0.00 packs per day. He has never used smokeless tobacco. He reports that he does not drink  alcohol or use illicit drugs.   FAMILY HISTORY:  He denies [name] family history of cancer or blood disorders.   LABORATORY DATA:  CBC    Component Value Date/Time   WBC 9.8 10/09/2012 1433   WBC 14.2* 06/19/2012 0530   RBC 6.23* 10/09/2012 1433   RBC 5.25 06/19/2012 0530   HGB 15.9 10/09/2012 1433   HGB 11.2* 06/19/2012 0530   HCT 49.1 10/09/2012 1433   HCT 36.9* 06/19/2012 0530   PLT 524* 10/09/2012 1433   PLT 766* 06/19/2012 0530   MCV 78.8*  10/09/2012 1433   MCV 70.3* 06/19/2012 0530   MCH 25.6* 10/09/2012 1433   MCH 21.3* 06/19/2012 0530   MCHC 32.4 10/09/2012 1433   MCHC 30.4 06/19/2012 0530   RDW 15.8* 10/09/2012 1433   RDW 17.0* 06/19/2012 0530   LYMPHSABS 2.7 10/09/2012 1433   LYMPHSABS 2.0 06/17/2012 1318   MONOABS 1.0* 10/09/2012 1433   MONOABS 1.0 06/17/2012 1318   EOSABS 0.3 10/09/2012 1433   EOSABS 0.3 06/17/2012 1318   BASOSABS 0.1 10/09/2012 1433   BASOSABS 0.2* 06/17/2012 1318    CMP     Component Value Date/Time   NA 140 10/09/2012 1434   NA 136 06/19/2012 0530   K 4.6 10/09/2012 1434   K 3.9 06/19/2012 0530   CL 102 06/19/2012 0530   CO2 27 10/09/2012 1434   CO2 25 06/19/2012 0530   GLUCOSE 124 10/09/2012 1434   GLUCOSE 147* 06/19/2012 0530   BUN 26.6* 10/09/2012 1434   BUN 20 06/19/2012 0530   CREATININE 1.3 10/09/2012 1434   CREATININE 1.25 06/19/2012 0530   CALCIUM 9.5 10/09/2012 1434   CALCIUM 9.2 06/19/2012 0530   PROT 7.2 10/09/2012 1434   PROT 7.0 09/02/2012 1419   PROT 7.3 06/17/2012 1318   ALBUMIN 3.4* 10/09/2012 1434   ALBUMIN 3.5 06/17/2012 1318   AST 19 10/09/2012 1434   AST 19 06/17/2012 1318   ALT 24 10/09/2012 1434   ALT 16 06/17/2012 1318   ALKPHOS 66 10/09/2012 1434   ALKPHOS 46 06/17/2012 1318   BILITOT 0.42 10/09/2012 1434   BILITOT 0.2* 06/17/2012 1318   GFRNONAA 54* 06/19/2012 0530   GFRAA 62* 06/19/2012 0530   Results for [name] (MRN [id number]) as of 10/12/2012 00:08  Ref. Range 09/02/2012 14:19  Albumin SerPl Elph-Mcnc Latest Range: 3.2-5.6 g/dL 4.0  Albumin/Glob SerPl Latest Range: 0.7-2.0  1.4  Alpha2 Glob SerPl Elph-Mcnc Latest Range: 0.4-1.2 g/dL 0.7  B-Globulin SerPl Elph-Mcnc Latest Range: 0.6-1.3 g/dL 0.8  Gamma Glob SerPl Elph-Mcnc Latest Range: 0.5-1.6 g/dL 1.3  IgA/Immunoglobulin [name], Serum Latest Range: 91-414 mg/dL 782  IgM (Immunoglobin M), Srm Latest Range: 40-230 mg/dL 956 (H)  M Protein SerPl Elph-Mcnc Latest Range: Not Observed g/dL 0.5 (H)  Total Protein Latest Range: 6.0-8.5  g/dL 7.0   Results for [name] (MRN [id number]) as of 10/12/2012 00:08  Ref. Range 09/02/2012 14:19  IgG (Immunoglobin G), Serum Latest Range: 872-610-0750 mg/dL 4696   Results for SHIN, LAMOUR (MRN [id number]) as of 10/12/2012 00:08  Ref. Range 09/02/2012 14:19  Vitamin B-12 Latest Range: 211-946 pg/mL >1999 (H)   RADIOGRAPHY: Mr Cervical Spine Wo Contrast  09/17/2012   [facility] [address], [address] [place], [place] [address] [phone]  NEUROIMAGING REPORT   STUDY DATE: 09/12/2012 PATIENT NAME: CLETE KUCH DOB: 1932/08/27 MRN: [id number]  ORDERING CLINICIAN: Dr Pearlean Brownie CLINICAL HISTORY:  30 year patient with gait abnormality COMPARISON FILMS: none EXAM:  MRI Cervical Spine wo TECHNIQUE: MRI of the cervical spine was obtained utilizing 3 mm sagittal  slices from the posterior fossa down to the T3-4 level with T1, T2 and  inversion recovery views. In addition 4 mm axial slices from C2-3 down to  T1-2 level were included with T2 and gradient echo views. CONTRAST: none IMAGING SITE: [facility] FINDINGS:  The cervical vertebrae show slight abnormal curvature with exaggerated  forward lordosis. The vertebral body heights appear normal but the marrow  signal shows replacement of fatty marrow which is [name] nonspecific finding  and may be seen in [name] variety of chronic conditions including anemia, renal  failure, myeloproliferative disorders. C2-3 shows minor disc, it is an  C3-4 shows mild facet hypertrophy but without definite stenosis. C4-5  shows INR the signal abnormalities. C5-6 shows prominent bilateral disc  osteophyte protrusions resulting in foraminal narrowing but no different  compression. C6-C7 shows prominent endplate marrow degenerative changes  with mild bilateral disc osteophyte protrusions again without significant  stenosis. C7-T1 shows mild posterior subluxation 2 mm. The visualized  portion of the lower brain stem, cerebellum unremarkable.        09/17/2012     Abnormal MR scan of the cervical spine showing prominent  spondylitic changes at C5-6 and C6-7 with lateral osteophyte protrusions  with mild foraminal narrowing but no definite compression.   INTERPRETING PHYSICIAN:  Delia Heady, MD Certified in  Neuroimaging by American Society of Neuroimaging and [employer]    Mr Thoracic Spine Wo Contrast  09/17/2012   [facility] [address] [place], [place] [address] [phone]  NEUROIMAGING REPORT   STUDY DATE: 09/12/2012 PATIENT NAME: RIKI GEHRING DOB: [birth year] MRN: [id number]  ORDERING CLINICIAN: Dr Pearlean Brownie CLINICAL HISTORY:  42 year patient with gait abnormality COMPARISON FILMS:  none EXAM: MRI Thoraxic Spine wo TECHNIQUE: MRI of the thoracic spine was obtained utilizing 3 mm sagittal  slices from C4-5 down to the L1-2 level with T1, T2 and inversion recovery  views. In addition 4 mm axial slices from C7-T1 down to T12-L1 level were  included with T1, T2 and gradient echo views.   CONTRAST: no IMAGING SITE: [facility]  FINDINGS:  The thoracic vertebrae demonstrate normal alignment, body height but  abnormal bone marrow signal with replacement of fatty marrow which is [name]  nonspecific finding. The intervertebral disc show mild the signal  abnormalities. There are spotty fatty marrow abnormalities and T8 and T10  vertebrae. There is no definite cord compression, root or foraminal  stenosis. The visualized portion of the lower cervical vertebrae also show  spondylytic changes.     09/17/2012    Abnormal MRI scan of thoracic spine showing replacement of  fatty marrow signal and mild disc degenerative changes but without  definite compression.   INTERPRETING PHYSICIAN:  Delia Heady, MD Certified in  Neuroimaging by American Society of Neuroimaging and [place]  [employer]specialities    Mr Lumbar Spine Wo Contrast  09/17/2012   [facility] [address],  [address] [place], [place] [address] [phone]  NEUROIMAGING REPORT   STUDY DATE:09/12/2012 PATIENT NAME: KAISEI GILBO DOB: 12-Nov-1932 MRN: [id number]  ORDERING CLINICIAN:  Dr Pearlean Brownie CLINICAL HISTORY: 37 year patient with gait abnormality COMPARISON FILMS:  None EXAM: MRI Lumbar Spine wo TECHNIQUE:MRI of the lumbar spine was obtained utilizing 4 mm sagittal  slices from T11-12 down to the lower sacrum with T1, T2 and inversion  recovery views. In addition 4 mm axial slices from L1-2 down to L5-S1  level were included with T1 and T2 weighted views.  CONTRAST: none IMAGING SITE: [facility] FINDINGS:  The lumbar vertebrae demonstrate normal alignment and body height but  abnormal bone marrow signal with replacement of fatty marrow diffusely  which is [name] nonspecific finding seen in [name] variety of chronic conditions.  The conus medullaris terminates at L1. L1-L2 shows minor disc signal  abnormality without any compression. L2-3 also show similar findings with  an L3-4 shows prominent central disc osteophyte protrusion with bilateral  facet hypertrophy resulting in mild foraminal narrowing. There is loss of  disc height. L4-5 shows also loss of disc height with prominent endplate  marrow degenerative changes and bilateral facet hypertrophy resulting in  mild foraminal narrowing. L5-S1 shows marked endplate marrow degenerative  changes and loss of disc height with mild facet hypertrophy but without  definite foraminal narrowing. The visualized paraspinal soft tissue appear  unremarkable.      09/17/2012    Abnormal MRI scan of lumbar spine is showing prominent  spondylitic changes at L3-4 and L4-5 with asymmetric facet hypertrophy  resulting in left greater than right mild foraminal narrowing.    INTERPRETING PHYSICIAN:  PRAMOD SETHI, MD Certified in  Neuroimaging by American Society of Neuroimaging and [employer]    REVIEW OF SYSTEMS:  Constitutional: Denies fevers, chills; +  weight loss Eyes: Denies blurriness of vision Ears, nose, mouth, throat, and face: Denies mucositis or sore throat Respiratory: Denies cough, dyspnea or wheezes Cardiovascular: Denies palpitation, chest discomfort or lower extremity swelling Gastrointestinal:  Denies nausea, heartburn or change in bowel habits Skin: Denies abnormal skin rashes Lymphatics: Denies new lymphadenopathy or easy bruising Neurological:Denies numbness, tingling or new weaknesses Behavioral/Psych: Mood is stable, no new changes  All other systems were reviewed with the patient and are negative.  PHYSICAL EXAM:  height is 6' (1.829 m) and weight is 201 lb (91.173 kg). His oral temperature is 97.6 F (36.4 C). His blood pressure is 147/67 and his pulse is 54. His respiration is 17 and oxygen saturation is 99%.    GENERAL:alert, no distress and comfortable, elderly male SKIN: skin color, texture, turgor are normal, no rashes or significant lesions EYES: normal, Conjunctiva are pink and non-injected, sclera clear OROPHARYNX:no exudate, no erythema and lips, buccal mucosa, and tongue normal  NECK: supple, thyroid normal size, non-tender, without nodularity LYMPH:  no palpable lymphadenopathy in the cervical, axillary or inguinal LUNGS: clear to auscultation and percussion with normal breathing effort HEART: regular rate & rhythm and no murmurs and no lower extremity edema ABDOMEN:abdomen soft, non-tender and normal bowel sounds Musculoskeletal:no cyanosis of digits and no clubbing  NEURO: alert & oriented x 3 with fluent speech, no focal motor/sensory deficits   IMPRESSION: Alann [name] Beckley is [name] 79 y.o. male with [name] history of  Newly diagnosed myeloproliferative disorder and possible IgM kappa MGUS.  He also has an elevated vitamin b12. JAK2 is positive. EPO pending. He is negative for classic aquagenic pruritus negative for erythromegalalia.   PLAN:  --He meets 1 major PCV study group criteria including an arterial  oxygen greater than or equal to 92% and but does not meet the hemoglobin greater than 18.5; he meets the three minor PCV criteria including elevation of platelets greater than 400,000, white blood cells greater than 12,000, serum vitamin B12 greater than 900. Other criteria which includes [name] bone marrow biopsy and erythropoietin  levels can also be used such as the revised WHO criteria. It requires elevated hbg as noted above plus [name] positive JAK2 (2 major) and one of the following: Findings on the bone marrow biopsy and [name] low EPO level. However, he does not have hemoglobin greater than 18.5.  We have not checked his EPO level. He doess not have splenomegaly on physical exam. In addition 95 to percent of people with polycythemia vera positive JAK2.   --Due to high suspicion as noted above, I think is , like the referring hematologist, to treat it as [name] JAK2 myeloproliferative disorder,  I recommend to perform for phlebetomy with [name] goal less than 45% 4 hematocrit on the CYTO-PV trial (Marchioli R, NEJM 2013). One unit phlebetomy (500 mL) should drop the hematocrit by 3%. I also recommend to continue [name] baby aspirin (81 mg) daily. Based on his age greater than 60 placing him at [name] higher thrombotic, we will also continue his hydroxyurea 500 mg daily  as an adjunctive therapy.   --The patient voices understanding of current disease status and treatment options and is in agreement with the current care plan. He should follow up with our clinic in one month and should have monthly CBCs.  He will repeat his CBC in one week to ascertain if [name] phlebectomy is warranted and his hydrea dose needs to be further titrated.    --In addition, his labs reveal an elevated IgM (612) plus kappa.  We will repeat SPEP, UPEP and Kappa/free light chains upon his next visit in consideration of MGUS.    --All questions were answered. The patient knows to call the clinic with any problems, questions or concerns. We can certainly see the  patient much sooner if necessary.   He was provided [name] handout on hydrea, and polycythemia vera (though he does not completely meet the diagnostic criteria).  He also was provided an after visit summary.   I spent 25 minutes counseling the patient face to face. The total time spent in the appointment was 45 minutes.    Jahshua Bonito, MD 10/09/2012 5:45 pm

## 2012-10-12 NOTE — Telephone Encounter (Signed)
Per staff message and POF I have scheduled appts.  JMW  

## 2012-10-13 ENCOUNTER — Telehealth: Payer: Self-pay | Admitting: *Deleted

## 2012-10-13 NOTE — Telephone Encounter (Signed)
Pateitn called and moved his appt to later in the morning

## 2012-10-15 ENCOUNTER — Ambulatory Visit (INDEPENDENT_AMBULATORY_CARE_PROVIDER_SITE_OTHER): Payer: Medicare Other | Admitting: Neurology

## 2012-10-15 ENCOUNTER — Encounter: Payer: Self-pay | Admitting: Neurology

## 2012-10-15 VITALS — BP 143/67 | HR 52 | Temp 97.8°F | Ht 72.0 in | Wt 203.0 lb

## 2012-10-15 DIAGNOSIS — E1149 Type 2 diabetes mellitus with other diabetic neurological complication: Secondary | ICD-10-CM

## 2012-10-15 NOTE — Patient Instructions (Signed)
He was advised to continue physical therapy and to use a walker for long distances all the time. I also advised him to maintain strict control of his diabetes with hemoglobin A1c goal below 6.5% No routine followup appointment is necessary. He may be referred back in the future by his primary care physician if needed.

## 2012-10-15 NOTE — Progress Notes (Signed)
[facility] [address] [place]. [place] [address] [phone]       OFFICE Follow Up NOTE  [name]. [name] Date of Birth:  1932-08-12 Medical Record Number:  [id number]   Referring MD:  Pearson Grippe  Reason for Referral:  Falls and balance  HPI: [name] is a 37 year pleasant Caucasian male who has had increasing balance and gait difficulties with frequent falls since last 1 year. Since the last 3 months particularly he at least 5 falls. 3 or 4 of them have occurred in the last 1 month itself. She stayed he states that these are mainly related to early strength is down on a chair. He loses his balance. However follow: He was in the mailbox and walking down his driveway and could not stop himself and fell forward.. He has noticed that he is walking with more of a shuffling gait 6 is still pressured. He however seems to do better when he slows down and walks more difficulty. He denies any tremors of his hands, drooling of saliva significant change in his handwriting micrographia. He feels his truncal balance is poor and when is sitting on the edge of the bed he often tends to fall to one side. He denies significant neck pain or medical pain though occasionally he'll notice pain in his spine in the midthoracic region. There is a remote history of a herniated disc in his low back and underwentdisc removal at L5 in 1978. He had episode of shingles in June this year and had many skin lesions which seem to have healed. He's noticed increasing difficulties and walking and balance since this. He has also noticed that his stamina has gone down and he tires more easily. He is currently being evaluated by hematologist Dr Allison Quarry in [facility] and for elevated platelet count of 865,000 with normal hematocrit and slightly elevated white count. Did not have any of those results for my review today. He does have long-standing history of diabetes which he says is quite well controlled and his  last hemoglobin A1c was 5.8 on 06/23/12. Denies significant pain or paresthesias in his feet. He MRI scan of the brain done on 06/17/12 which I have personally reviewed shows mild generalized cerebral atrophy changes of chronic microvascular ischemia without any acute abnormality. CBC and basic metabolic panel labs done on 06/23/12 were normal Update 10/15/2012 He returns for f/u after last visit 09/02/2012. He states he is doing much better with referral to physical therapy and is walking better .MRI examination  LS spine showed spondylitic changes at L 3-4 and L 4-5 with asymmetric facet hypertrophy left graeter than right mild formainal narrowing, MRI Thoraxic spine showed mild abnormal marrow fatty signal and disc degenerative changes without compression.MRI examination of the internal auditory canals Cervical spine showed prominent spondylitic changes at C 5-6 and C 6-7 with lateral osteophyte protusions with mild formainal narrowing. NCV/EMG 09/18/12 showed mild axonal peripheral neuropathy and mild chronic left L4, L5, S 1 radiculopathy.He denies significant paresthesias and sugars are under good control.  ROS:   14 system review of systems is positive for  Gait and balance difficulties only PMH:  Past Medical History  Diagnosis Date  . Diabetes mellitus without complication   . Coronary artery disease   . Hypertension   . Hyperlipidemia   . Chest pain     Social History:  History   Social History  . Marital Status: Married    Spouse Name: N/A  Number of Children: 4  . Years of Education: 12   Occupational History  . Not on file.   Social History Main Topics  . Smoking status: Former Smoker    Types: Cigarettes  . Smokeless tobacco: Never Used  . Alcohol Use: No  . Drug Use: No  . Sexual Activity: No   Other Topics Concern  . Not on file   Social History Narrative  . No narrative on file    Medications:   Current Outpatient Prescriptions on File Prior to Visit   Medication Sig Dispense Refill  . aspirin 325 MG tablet Take 325 mg by mouth daily.      . DULoxetine (CYMBALTA) 30 MG capsule Take 30 mg by mouth 2 (two) times daily.      Marland Kitchen gabapentin (NEURONTIN) 100 MG capsule Take 100 mg by mouth 2 (two) times daily.      Marland Kitchen glimepiride (AMARYL) 4 MG tablet Take 4 mg by mouth daily before breakfast.      . hydroxyurea (HYDREA) 500 MG capsule Take 500 mg by mouth daily. May take with food to minimize GI side effects.      Marland Kitchen l-methylfolate-B6-B12 (METANX) 3-35-2 MG TABS Take 1 tablet by mouth 2 (two) times daily.      Marland Kitchen losartan (COZAAR) 100 MG tablet Take 100 mg by mouth daily.      . metoprolol (LOPRESSOR) 50 MG tablet Take 1 tablet (50 mg total) by mouth 2 (two) times daily.  60 tablet  5  . Omega-3 Fatty Acids (FISH OIL) 1200 MG CAPS Take 1 capsule by mouth 2 (two) times daily.      Marland Kitchen omeprazole (PRILOSEC) 20 MG capsule Take 20 mg by mouth daily.      . simvastatin (ZOCOR) 40 MG tablet Take 40 mg by mouth every evening.       No current facility-administered medications on file prior to visit.    Allergies:  No Known Allergies  Physical Exam General: well developed, well nourished, seated, in no evident distress Head: head normocephalic and atraumatic. Orohparynx benign Neck: supple with no carotid or supraclavicular bruits Cardiovascular: regular rate and rhythm, no murmurs Musculoskeletal: no deformity Skin:  no rash/petichiae. No zoster lesions on back noted. Vascular:  Normal pulses all extremities Filed Vitals:   10/15/12 1535  BP: 143/67  Pulse: 52  Temp: 97.8 F (36.6 C)    Neurologic Exam Mental Status: Awake and fully alert. Oriented to place and time. Recent and remote memory intact. Attention span, concentration and fund of knowledge appropriate. Mood and affect appropriate. Recall 2/3. Animal naming test 13 Cranial Nerves: Fundoscopic exam reveals sharp disc margins. Pupils equal, briskly reactive to light. Extraocular movements  full without nystagmus. Visual fields full to confrontation. Hearing intact. Facial sensation intact. Face, tongue, palate moves normally and symmetrically.  Motor: Normal bulk and tone. Normal strength in all tested extremity muscles. Mild weakness left dorsiflexors bilaterally Sensory.:diminished touch and pinprick sensation on the foot bilaterally and vibration and position sensation over the toes bilaterally.  Coordination: Rapid alternating movements normal in all extremities. Finger-to-nose and heel-to-shin performed accurately bilaterally. Gait and Station: Arises from chair with some difficulty. Stance is broad based and mildly stoooped. Gait demonstrates normal stride length  No festination or freezing or diminished arm swing. Positive Rhomberg`s and poor truncal balance and postural response to threat . Unable to heel, toe and tandem walk without difficulty.  Reflexes: 1+ and symmetric except ankle jerks are absent bilaterally. Toes downgoing.  ASSESSMENT: 74 year Caucasian male with a one-year history of progressive gait and balance difficulties with increasing falls of undetermined etiology likely from underlying peripheral neuropathy no other etiologies like compressive or zoster  myelopathy needs to be evaluated for as well.    PLAN: Plan check MRI scan of the cervical, thoracic and lumbar spine or compressive myelopathy and EMG nerve conduction study as well as neuropathic panel labs. Refer to physical therapy for gait and balance training. Return for followup in 6 weeks .

## 2012-10-16 ENCOUNTER — Ambulatory Visit: Payer: Medicare Other | Admitting: Physical Therapy

## 2012-10-16 ENCOUNTER — Other Ambulatory Visit: Payer: Self-pay

## 2012-10-16 ENCOUNTER — Ambulatory Visit: Payer: Medicare Other

## 2012-10-16 ENCOUNTER — Ambulatory Visit (HOSPITAL_COMMUNITY)
Admission: RE | Admit: 2012-10-16 | Discharge: 2012-10-16 | Disposition: A | Discharge status: Home / Self Care | Payer: Medicare Other | Source: Ambulatory Visit | Attending: Internal Medicine | Admitting: Internal Medicine

## 2012-10-16 ENCOUNTER — Other Ambulatory Visit (HOSPITAL_BASED_OUTPATIENT_CLINIC_OR_DEPARTMENT_OTHER): Payer: Medicare Other | Admitting: Lab

## 2012-10-16 DIAGNOSIS — D72819 Decreased white blood cell count, unspecified: Secondary | ICD-10-CM

## 2012-10-16 DIAGNOSIS — Z9861 Coronary angioplasty status: Secondary | ICD-10-CM | POA: Insufficient documentation

## 2012-10-16 DIAGNOSIS — R5381 Other malaise: Secondary | ICD-10-CM | POA: Insufficient documentation

## 2012-10-16 DIAGNOSIS — I2510 Atherosclerotic heart disease of native coronary artery without angina pectoris: Secondary | ICD-10-CM | POA: Insufficient documentation

## 2012-10-16 DIAGNOSIS — R0602 Shortness of breath: Secondary | ICD-10-CM | POA: Insufficient documentation

## 2012-10-16 DIAGNOSIS — Z6826 Body mass index (BMI) 26.0-26.9, adult: Secondary | ICD-10-CM | POA: Insufficient documentation

## 2012-10-16 DIAGNOSIS — R079 Chest pain, unspecified: Secondary | ICD-10-CM | POA: Insufficient documentation

## 2012-10-16 DIAGNOSIS — R0989 Other specified symptoms and signs involving the circulatory and respiratory systems: Secondary | ICD-10-CM | POA: Insufficient documentation

## 2012-10-16 DIAGNOSIS — Z8249 Family history of ischemic heart disease and other diseases of the circulatory system: Secondary | ICD-10-CM | POA: Insufficient documentation

## 2012-10-16 DIAGNOSIS — E119 Type 2 diabetes mellitus without complications: Secondary | ICD-10-CM | POA: Insufficient documentation

## 2012-10-16 DIAGNOSIS — Z87891 Personal history of nicotine dependence: Secondary | ICD-10-CM | POA: Insufficient documentation

## 2012-10-16 DIAGNOSIS — R0609 Other forms of dyspnea: Secondary | ICD-10-CM | POA: Insufficient documentation

## 2012-10-16 DIAGNOSIS — I10 Essential (primary) hypertension: Secondary | ICD-10-CM | POA: Insufficient documentation

## 2012-10-16 DIAGNOSIS — E663 Overweight: Secondary | ICD-10-CM | POA: Insufficient documentation

## 2012-10-16 DIAGNOSIS — D469 Myelodysplastic syndrome, unspecified: Secondary | ICD-10-CM

## 2012-10-16 DIAGNOSIS — R42 Dizziness and giddiness: Secondary | ICD-10-CM | POA: Insufficient documentation

## 2012-10-16 LAB — CBC WITH DIFFERENTIAL/PLATELET
Basophils Absolute: 0.1 10*3/uL (ref 0.0–0.1)
Eosinophils Absolute: 0.3 10*3/uL (ref 0.0–0.5)
HCT: 49.4 % (ref 38.4–49.9)
HGB: 15.7 g/dL (ref 13.0–17.1)
LYMPH%: 21.4 % (ref 14.0–49.0)
MCV: 78.6 fL — ABNORMAL LOW (ref 79.3–98.0)
MONO#: 0.9 10*3/uL (ref 0.1–0.9)
MONO%: 10.1 % (ref 0.0–14.0)
NEUT#: 5.6 10*3/uL (ref 1.5–6.5)
NEUT%: 63.8 % (ref 39.0–75.0)
Platelets: 422 10*3/uL — ABNORMAL HIGH (ref 140–400)
RBC: 6.29 10*6/uL — ABNORMAL HIGH (ref 4.20–5.82)
RDW: 16.3 % — ABNORMAL HIGH (ref 11.0–14.6)
WBC: 8.8 10*3/uL (ref 4.0–10.3)

## 2012-10-16 MED ORDER — TECHNETIUM TC 99M SESTAMIBI GENERIC - CARDIOLITE
30.6000 | Freq: Once | INTRAVENOUS | Status: AC | PRN
  Administered 2012-10-16: 31 via INTRAVENOUS

## 2012-10-16 MED ORDER — TECHNETIUM TC 99M SESTAMIBI GENERIC - CARDIOLITE
10.9000 | Freq: Once | INTRAVENOUS | Status: AC | PRN
  Administered 2012-10-16: 10.9 via INTRAVENOUS

## 2012-10-16 MED ORDER — REGADENOSON 0.4 MG/5ML IV SOLN
0.4000 mg | Freq: Once | INTRAVENOUS | Status: AC
  Administered 2012-10-16: 0.4 mg via INTRAVENOUS

## 2012-10-16 NOTE — Progress Notes (Signed)
Hct today at 49.4, goal per Dr Benjiman Core notes for hct to be 45%, patient states he is having stress test today @ 12:30 and patient and son concerned how the phlebotomy will affect the stress test, states he cannot eat two hours prior to stress test. HR currently @ 52 and documented. Reviewed with MD, patient to be r/s for phlebotomy and labs in one week. Patient and son in agreement. Desk nurse Olegario Messier RN, informed for POF to be sent and pt to be r/s. Patient and son know to expect call from scheduling regarding appt for next week. Patient and son deny further questions, know to call office with any questions or concerns.

## 2012-10-16 NOTE — Procedures (Addendum)
[facility] [address] [place] [address] [phone]  Cardiology Nuclear Med Study  Eulan A Dull is a 79 y.o. male     MRN : [id number]     DOB: [birth year]  Procedure Date: 10/16/2012  Nuclear Med Background Indication for Stress Test:  Stent Patency History:  CAD;STENT/PTCA Cardiac Risk Factors: Family History - CAD, History of Smoking, Hypertension, Lipids, NIDDM and Overweight  Symptoms:  Chest Pain, Dizziness, DOE, Fatigue and SOB   Nuclear Pre-Procedure Caffeine/Decaff Intake:  12:00am NPO After: 10 AM   IV Site: R Hand  IV 0.9% NS with Angio Cath:  22g  Chest Size (in):  40"  IV Started by: Emmit Pomfret, RN  Height: 6' (1.829 m)  Cup Size: n/a  BMI:  Body mass index is 26.58 kg/(m^2). Weight:  196 lb (88.905 kg)   Tech Comments:  N/A    Nuclear Med Study 1 or 2 day study: 1 day  Stress Test Type:  Lexiscan  Order Authorizing Provider:  Nanetta Batty, MD   Resting Radionuclide: Technetium 68m Sestamibi  Resting Radionuclide Dose: 10.9 mCi   Stress Radionuclide:  Technetium 5m Sestamibi  Stress Radionuclide Dose: 30.6 mCi           Stress Protocol Rest HR: 55 Stress HR: 77  Rest BP: 114/81 Stress BP: 136/75  Exercise Time (min): n/a METS: n/a          Dose of Adenosine (mg):  n/a Dose of Lexiscan: 0.4 mg  Dose of Atropine (mg): n/a Dose of Dobutamine: n/a mcg/kg/min (at max HR)  Stress Test Technologist: Ernestene Mention, CCT Nuclear Technologist: Gonzella Lex, CNMT   Rest Procedure:  Myocardial perfusion imaging was performed at rest 45 minutes following the intravenous administration of Technetium 66m Sestamibi. Stress Procedure:  The patient received IV Lexiscan 0.4 mg over 15-seconds.  Technetium 44m Sestamibi injected at 30-seconds.  There were no significant changes with Lexiscan.  Quantitative spect images were obtained after a 45 minute delay.  Transient Ischemic Dilatation (Normal  <1.22):  1.0 Lung/Heart Ratio (Normal <0.45):  0.34 QGS EDV:  114 ml QGS ESV:  46 ml LV Ejection Fraction: 60%  Rest ECG: NSR - Normal EKG  Stress ECG: No significant change from baseline ECG  QPS Raw Data Images:  There is interference from nuclear activity from structures below the diaphragm. This does not affect the ability to read the study. Stress Images:  There is decreased uptake in the inferior wall. Rest Images:  There is decreased uptake in the inferior wall. Subtraction (SDS):  No evidence of ischemia.  Impression Exercise Capacity:  Lexiscan with no exercise. BP Response:  Normal blood pressure response. Clinical Symptoms:  There is dyspnea. ECG Impression:  No significant ECG changes with Lexiscan. Comparison with Prior Nuclear Study: No significant change from previous study  Overall Impression:  Low risk stress nuclear study with inferior bowel attenuation artifact. No reversible ischemia.  LV Wall Motion:  NL LV Function; NL Wall Motion; EF 60%.  Chrystie Nose, MD, [facility] Board Certified in Nuclear Cardiology Attending Cardiologist [facility]  Chrystie Nose, MD  10/16/2012 5:14 PM

## 2012-10-18 ENCOUNTER — Encounter: Payer: Self-pay | Admitting: *Deleted

## 2012-10-18 NOTE — Progress Notes (Signed)
Quick Note:  Note sent to patient ______ 

## 2012-10-19 ENCOUNTER — Telehealth: Payer: Self-pay | Admitting: Internal Medicine

## 2012-10-19 ENCOUNTER — Telehealth: Payer: Self-pay | Admitting: *Deleted

## 2012-10-19 ENCOUNTER — Ambulatory Visit (HOSPITAL_COMMUNITY)
Admission: RE | Admit: 2012-10-19 | Discharge: 2012-10-19 | Disposition: A | Discharge status: Home / Self Care | Payer: Medicare Other | Source: Ambulatory Visit | Attending: Cardiovascular Disease | Admitting: Cardiovascular Disease

## 2012-10-19 DIAGNOSIS — R0602 Shortness of breath: Secondary | ICD-10-CM | POA: Insufficient documentation

## 2012-10-19 NOTE — Telephone Encounter (Signed)
, °

## 2012-10-19 NOTE — Telephone Encounter (Signed)
Per staff message and POF I have scheduled appts.  JMW  

## 2012-10-19 NOTE — Progress Notes (Signed)
2D Echo Performed 10/19/2012    [name], RCS  

## 2012-10-20 ENCOUNTER — Telehealth: Payer: Self-pay | Admitting: *Deleted

## 2012-10-20 NOTE — Telephone Encounter (Signed)
Per staff message and POF I have scheduled appts.  JMW  

## 2012-10-21 ENCOUNTER — Encounter: Payer: Self-pay | Admitting: *Deleted

## 2012-10-22 ENCOUNTER — Other Ambulatory Visit: Payer: Self-pay | Admitting: Medical Oncology

## 2012-10-22 DIAGNOSIS — D471 Chronic myeloproliferative disease: Secondary | ICD-10-CM

## 2012-10-23 ENCOUNTER — Ambulatory Visit (HOSPITAL_BASED_OUTPATIENT_CLINIC_OR_DEPARTMENT_OTHER): Payer: Medicare Other

## 2012-10-23 ENCOUNTER — Ambulatory Visit: Payer: Medicare Other | Admitting: Rehabilitative and Restorative Service Providers"

## 2012-10-23 ENCOUNTER — Other Ambulatory Visit: Payer: Self-pay | Admitting: Internal Medicine

## 2012-10-23 ENCOUNTER — Other Ambulatory Visit (HOSPITAL_BASED_OUTPATIENT_CLINIC_OR_DEPARTMENT_OTHER): Payer: Medicare Other | Admitting: Lab

## 2012-10-23 VITALS — BP 172/66 | HR 49 | Temp 97.6°F

## 2012-10-23 DIAGNOSIS — D47Z9 Other specified neoplasms of uncertain behavior of lymphoid, hematopoietic and related tissue: Secondary | ICD-10-CM

## 2012-10-23 DIAGNOSIS — D471 Chronic myeloproliferative disease: Secondary | ICD-10-CM

## 2012-10-23 LAB — CBC WITH DIFFERENTIAL/PLATELET
Basophils Absolute: 0.2 10*3/uL — ABNORMAL HIGH (ref 0.0–0.1)
EOS%: 2.5 % (ref 0.0–7.0)
HCT: 48.9 % (ref 38.4–49.9)
HGB: 15.6 g/dL (ref 13.0–17.1)
MCH: 25.3 pg — ABNORMAL LOW (ref 27.2–33.4)
MCHC: 32 g/dL (ref 32.0–36.0)
MCV: 79.3 fL (ref 79.3–98.0)
NEUT%: 60.4 % (ref 39.0–75.0)
RBC: 6.16 10*6/uL — ABNORMAL HIGH (ref 4.20–5.82)
lymph#: 1.9 10*3/uL (ref 0.9–3.3)

## 2012-10-23 MED ORDER — HYDROXYUREA 500 MG PO CAPS: ORAL_CAPSULE | ORAL | Status: DC

## 2012-10-23 NOTE — Progress Notes (Signed)
Phlebotomy done with withdrawn.  Patient given po fluids and sandwhich while here.  He tolerated procedure well with no complaints.  Reviewed written instructions with patient and son. Discussed schedule and possible change of hydrea with Garald Braver RN.  She will discuss with Dr. Rosie Fate and call patient.  They do not think he is due another phlebotomy before he sees Dr.Chism again. Discharged at 1230 with son.  Patient on a walker but having no problems navigating.

## 2012-10-23 NOTE — Patient Instructions (Signed)
Therapeutic Phlebotomy Therapeutic phlebotomy is the controlled removal of blood from your body for the purpose of treating a medical condition. It is similar to donating blood. Usually, about a pint (470 mL) of blood is removed. The average adult has 9 to 12 pints (4.3 to 5.7 L) of blood. Therapeutic phlebotomy may be used to treat the following medical conditions:  Hemochromatosis. This is a condition in which there is too much iron in the blood.  Polycythemia vera. This is a condition in which there are too many red cells in the blood.  Porphyria cutanea tarda. This is a disease usually passed from one generation to the next (inherited). It is a condition in which an important part of hemoglobin is not made properly. This results in the build up of abnormal amounts of porphyrins in the body.  Sickle cell disease. This is an inherited disease. It is a condition in which the red blood cells form an abnormal crescent shape rather than a round shape. LET YOUR CAREGIVER KNOW ABOUT:  Allergies.  Medicines taken including herbs, eyedrops, over-the-counter medicines, and creams.  Use of steroids (by mouth or creams).  Previous problems with anesthetics or numbing medicine.  History of blood clots.  History of bleeding or blood problems.  Previous surgery.  Possibility of pregnancy, if this applies. RISKS AND COMPLICATIONS This is a simple and safe procedure. Problems are unlikely. However, problems can occur and may include:  Nausea or lightheadedness.  Low blood pressure.  Soreness, bleeding, swelling, or bruising at the needle insertion site.  Infection. BEFORE THE PROCEDURE  This is a procedure that can be done as an outpatient. Confirm the time that you need to arrive for your procedure. Confirm whether there is a need to fast or withhold any medications. It is helpful to wear clothing with sleeves that can be raised above the elbow. A blood sample may be done to determine the  amount of red blood cells or iron in your blood. Plan ahead of time to have someone drive you home after the procedure. PROCEDURE The entire procedure from preparation through recovery takes about 1 hour. The actual collection takes about 10 to 15 minutes.  A needle will be inserted into your vein.  Tubing and a collection bag will be attached to that needle.  Blood will flow through the needle and tubing into the collection bag.  You may be asked to open and close your hand slowly and continuously during the entire collection.  Once the specified amount of blood has been removed from your body, the collection bag and tubing will be clamped.  The needle will be removed.  Pressure will be held on the site of the needle insertion to stop the bleeding. Then a bandage will be placed over the needle insertion site. AFTER THE PROCEDURE  Your recovery will be assessed and monitored. If there are no problems, as an outpatient, you should be able to go home shortly after the procedure.  Document Released: 05/28/2010 Document Revised: 03/18/2011 Document Reviewed: 05/28/2010 Exit[employer] Patient Information 2014 [employer].  

## 2012-10-25 ENCOUNTER — Other Ambulatory Visit: Payer: Self-pay | Admitting: Internal Medicine

## 2012-10-26 ENCOUNTER — Telehealth: Payer: Self-pay | Admitting: *Deleted

## 2012-10-26 NOTE — Telephone Encounter (Signed)
Per staff message and POF I have scheduled appts.  JMW  

## 2012-10-29 ENCOUNTER — Other Ambulatory Visit: Payer: Medicare Other | Admitting: Lab

## 2012-10-30 ENCOUNTER — Ambulatory Visit (INDEPENDENT_AMBULATORY_CARE_PROVIDER_SITE_OTHER): Payer: Medicare Other | Admitting: Cardiovascular Disease

## 2012-10-30 ENCOUNTER — Ambulatory Visit: Payer: Medicare Other | Admitting: Rehabilitative and Restorative Service Providers"

## 2012-10-30 ENCOUNTER — Other Ambulatory Visit: Payer: Medicare Other | Admitting: Lab

## 2012-10-30 ENCOUNTER — Encounter: Payer: Self-pay | Admitting: Cardiovascular Disease

## 2012-10-30 VITALS — BP 140/70 | HR 60 | Ht 72.0 in | Wt 205.3 lb

## 2012-10-30 DIAGNOSIS — I2510 Atherosclerotic heart disease of native coronary artery without angina pectoris: Secondary | ICD-10-CM

## 2012-10-30 NOTE — Progress Notes (Signed)
10/30/2012 [name]   04-Dec-1932  [id number]  Primary Physician Pearson Grippe, MD Primary Cardiologist: Runell Gess MD [name]   HPI:  The patient is a very pleasant 79 year old mildly overweight widowed Caucasian male (wife died Sep 09, 2010 at age 48), father of 3, grandfather to 6 grandchildren who I last saw 6 months ago. He has a history of CAD status post LAD and RCA stenting in the past. He was last catheterized by Dr. Elsie Lincoln in 2007 and found to have patent stents with normal LV function. His other problems include hypertension, hyperlipidemia and diabetes. He does not smoke or drink. His most recent lipid profile performed by Dr. Selena Batten in June of last year revealed a total cholesterol of 128, LDL of 67, and HDL of 35  Since I saw him 01/30/12 he has developed recurrent chest pain. He's also had shingles in the interim. He saw Dr. Pearson Grippe in the office in referred him here for further evaluation and stress testing. He also has been complaining of dyspnea on exertion and orthopnea which is a new finding as well. I subsequently performed Myoview stress testing and 2-D echocardiography all of which were normal. His symptoms have since resolved since I saw him one month ago.    Current Outpatient Prescriptions  Medication Sig Dispense Refill  . aspirin 325 MG tablet Take 325 mg by mouth daily.      . DULoxetine (CYMBALTA) 30 MG capsule Take 30 mg by mouth 2 (two) times daily.      Marland Kitchen gabapentin (NEURONTIN) 100 MG capsule Take 100 mg by mouth 2 (two) times daily.      Marland Kitchen glimepiride (AMARYL) 4 MG tablet Take 4 mg by mouth daily before breakfast.      . hydroxyurea (HYDREA) 500 MG capsule 500 mg. Please take one (500 mg) tablet by mouth on Friday, Sunday, Tuesday and Thursday and two (500mg  tablets) for a total of 1,000 mg on Sat, Mon and Wednesday. May take with food to minimize GI side effects.      Marland Kitchen l-methylfolate-B6-B12 (METANX) 3-35-2 MG TABS Take 1 tablet  by mouth 2 (two) times daily.      Marland Kitchen losartan (COZAAR) 100 MG tablet Take 100 mg by mouth daily.      . metoprolol (LOPRESSOR) 50 MG tablet Take 1 tablet (50 mg total) by mouth 2 (two) times daily.  60 tablet  5  . Omega-3 Fatty Acids (FISH OIL) 1200 MG CAPS Take 1 capsule by mouth 2 (two) times daily.      Marland Kitchen omeprazole (PRILOSEC) 20 MG capsule Take 20 mg by mouth daily.      . simvastatin (ZOCOR) 40 MG tablet Take 40 mg by mouth every evening.       No current facility-administered medications for this visit.    No Known Allergies  History   Social History  . Marital Status: Married    Spouse Name: N/A    Number of Children: 4  . Years of Education: 12   Occupational History  . Not on file.   Social History Main Topics  . Smoking status: Former Smoker    Types: Cigarettes  . Smokeless tobacco: Former Neurosurgeon    Quit date: 10/31/1982  . Alcohol Use: No  . Drug Use: No  . Sexual Activity: No   Other Topics Concern  . Not on file   Social History Narrative  . No narrative on file  Review of Systems: General: negative for chills, fever, night sweats or weight changes.  Cardiovascular: negative for chest pain, dyspnea on exertion, edema, orthopnea, palpitations, paroxysmal nocturnal dyspnea or shortness of breath Dermatological: negative for rash Respiratory: negative for cough or wheezing Urologic: negative for hematuria Abdominal: negative for nausea, vomiting, diarrhea, bright red blood per rectum, melena, or hematemesis Neurologic: negative for visual changes, syncope, or dizziness All other systems reviewed and are otherwise negative except as noted above.    Blood pressure 140/70, pulse 60, height 6' (1.829 m), weight 205 lb 4.8 oz (93.123 kg).  General appearance: alert and no distress Neck: no adenopathy, no carotid bruit, no JVD, supple, symmetrical, trachea midline and thyroid not enlarged, symmetric, no tenderness/mass/nodules Lungs: clear to auscultation  bilaterally Heart: regular rate and rhythm, S1, S2 normal, no murmur, click, rub or gallop Extremities: extremities normal, atraumatic, no cyanosis or edema  EKG not performed today  ASSESSMENT AND PLAN:   CORONARY ARTERY DISEASE I saw Mr. Shuler 10/02/12 with complaints of chest pain. Earlier this month we performed Myoview stress testing and 2-D echocardiography all of which were normal. His symptoms seemed to have resolved.      Runell Gess MD FACP,FACC,FAHA, [facility] 10/30/2012 2:36 PM

## 2012-10-30 NOTE — Assessment & Plan Note (Signed)
I saw [name] 10/02/12 with complaints of chest pain. Earlier this month we performed Myoview stress testing and 2-D echocardiography all of which were normal. His symptoms seemed to have resolved.

## 2012-10-30 NOTE — Patient Instructions (Signed)
Your physician wants you to follow-up in: 1 year with Dr Berry. You will receive a reminder letter in the mail two months in advance. If you don't receive a letter, please call our office to schedule the follow-up appointment.  

## 2012-11-06 ENCOUNTER — Ambulatory Visit: Payer: Medicare Other | Admitting: Rehabilitative and Restorative Service Providers"

## 2012-11-13 ENCOUNTER — Ambulatory Visit (HOSPITAL_BASED_OUTPATIENT_CLINIC_OR_DEPARTMENT_OTHER): Payer: Medicare Other | Admitting: Internal Medicine

## 2012-11-13 ENCOUNTER — Other Ambulatory Visit (HOSPITAL_BASED_OUTPATIENT_CLINIC_OR_DEPARTMENT_OTHER): Payer: Medicare Other | Admitting: Lab

## 2012-11-13 ENCOUNTER — Telehealth: Payer: Self-pay | Admitting: Internal Medicine

## 2012-11-13 ENCOUNTER — Other Ambulatory Visit: Payer: Self-pay | Admitting: Internal Medicine

## 2012-11-13 ENCOUNTER — Ambulatory Visit (HOSPITAL_BASED_OUTPATIENT_CLINIC_OR_DEPARTMENT_OTHER): Payer: Medicare Other

## 2012-11-13 DIAGNOSIS — D72819 Decreased white blood cell count, unspecified: Secondary | ICD-10-CM

## 2012-11-13 DIAGNOSIS — N179 Acute kidney failure, unspecified: Secondary | ICD-10-CM

## 2012-11-13 DIAGNOSIS — D471 Chronic myeloproliferative disease: Secondary | ICD-10-CM

## 2012-11-13 DIAGNOSIS — D47Z9 Other specified neoplasms of uncertain behavior of lymphoid, hematopoietic and related tissue: Secondary | ICD-10-CM

## 2012-11-13 DIAGNOSIS — R894 Abnormal immunological findings in specimens from other organs, systems and tissues: Secondary | ICD-10-CM

## 2012-11-13 LAB — CBC WITH DIFFERENTIAL/PLATELET
Basophils Absolute: 0.1 10*3/uL (ref 0.0–0.1)
Eosinophils Absolute: 0.2 10*3/uL (ref 0.0–0.5)
HGB: 15.2 g/dL (ref 13.0–17.1)
MONO#: 0.9 10*3/uL (ref 0.1–0.9)
NEUT#: 6 10*3/uL (ref 1.5–6.5)
RDW: 19.5 % — ABNORMAL HIGH (ref 11.0–14.6)
WBC: 9.2 10*3/uL (ref 4.0–10.3)
lymph#: 1.9 10*3/uL (ref 0.9–3.3)

## 2012-11-13 LAB — COMPREHENSIVE METABOLIC PANEL (CC13)
AST: 23 U/L (ref 5–34)
Anion Gap: 10 mEq/L (ref 3–11)
BUN: 21.6 mg/dL (ref 7.0–26.0)
CO2: 23 mEq/L (ref 22–29)
Calcium: 9.6 mg/dL (ref 8.4–10.4)
Chloride: 107 mEq/L (ref 98–109)
Creatinine: 0.9 mg/dL (ref 0.7–1.3)
Sodium: 141 mEq/L (ref 136–145)
Total Protein: 7.2 g/dL (ref 6.4–8.3)

## 2012-11-13 NOTE — Patient Instructions (Signed)
Therapeutic Phlebotomy Therapeutic phlebotomy is the controlled removal of blood from your body for the purpose of treating a medical condition. It is similar to donating blood. Usually, about a pint (470 mL) of blood is removed. The average adult has 9 to 12 pints (4.3 to 5.7 L) of blood. Therapeutic phlebotomy may be used to treat the following medical conditions:  Hemochromatosis. This is a condition in which there is too much iron in the blood.  Polycythemia vera. This is a condition in which there are too many red cells in the blood.  Porphyria cutanea tarda. This is a disease usually passed from one generation to the next (inherited). It is a condition in which an important part of hemoglobin is not made properly. This results in the build up of abnormal amounts of porphyrins in the body.  Sickle cell disease. This is an inherited disease. It is a condition in which the red blood cells form an abnormal crescent shape rather than a round shape. LET YOUR CAREGIVER KNOW ABOUT:  Allergies.  Medicines taken including herbs, eyedrops, over-the-counter medicines, and creams.  Use of steroids (by mouth or creams).  Previous problems with anesthetics or numbing medicine.  History of blood clots.  History of bleeding or blood problems.  Previous surgery.  Possibility of pregnancy, if this applies. RISKS AND COMPLICATIONS This is a simple and safe procedure. Problems are unlikely. However, problems can occur and may include:  Nausea or lightheadedness.  Low blood pressure.  Soreness, bleeding, swelling, or bruising at the needle insertion site.  Infection. BEFORE THE PROCEDURE  This is a procedure that can be done as an outpatient. Confirm the time that you need to arrive for your procedure. Confirm whether there is a need to fast or withhold any medications. It is helpful to wear clothing with sleeves that can be raised above the elbow. A blood sample may be done to determine the  amount of red blood cells or iron in your blood. Plan ahead of time to have someone drive you home after the procedure. PROCEDURE The entire procedure from preparation through recovery takes about 1 hour. The actual collection takes about 10 to 15 minutes.  A needle will be inserted into your vein.  Tubing and a collection bag will be attached to that needle.  Blood will flow through the needle and tubing into the collection bag.  You may be asked to open and close your hand slowly and continuously during the entire collection.  Once the specified amount of blood has been removed from your body, the collection bag and tubing will be clamped.  The needle will be removed.  Pressure will be held on the site of the needle insertion to stop the bleeding. Then a bandage will be placed over the needle insertion site. AFTER THE PROCEDURE  Your recovery will be assessed and monitored. If there are no problems, as an outpatient, you should be able to go home shortly after the procedure.  Document Released: 05/28/2010 Document Revised: 03/18/2011 Document Reviewed: 05/28/2010 Exit[employer] Patient Information 2014 [employer].  

## 2012-11-13 NOTE — Progress Notes (Signed)
[facility] OFFICE PROGRESS NOTE  Pearson Grippe, MD [address] [place] [address]  DIAGNOSIS: Myeloproliferative disorder - Plan: CBC with Differential, CBC with Differential in 1 month, Comprehensive metabolic panel  Chief Complaint  Patient presents with  . Polycythemia    CURRENT THERAPY: Hydrea 500 mg on Friday, Sunday, Tuesday and Thursday; 1,000 mg on Sat, Mon and Wednesday; Phlebetomy to maintain hemocrit less than 45%.   INTERVAL HISTORY: [name] 79 y.o. male with history of myeloproliferative disorder is here for follow-up.  He was last seen by me on 10/09/2012.  He was eferred by Dr. Vanessa Ralphs. He was seen by Dr. Allison Quarry of the [facility] hematology/oncology in August for persistently elevated white cell count and platelet counts. Today he is accompanied by his son Gabriel Rung)  As reported previously, Mr. Covin has lost about 25 pounds over the past several months with a decline in his health overall. In June of this year, he began having weakness and falls. He was admitted by his PCP (Dr. Pearson Grippe) to [facility] hospital and scans including his abdomen and pelvis and MRI of his abdomen was negative (per the patient and his son). He was persistently iron deficient and mildly anemic prompting further evaluation by Dr. Devona Konig in gastroenterology. Dr. Conley Rolls performed and upper and lower GI endoscopy and there was no evidence of bleeding source and no malignancy. Non-malignant polyps were removed.   In July, there was noticeable elevation of his white cell count with a CBC on 07/21/2012 revealing WBC of 14.3, hemoglobin of 14.1, MV of 75.4 and a platelet count of 774. There was evidence of neutrophilia, lymphocytosis and monocytosis. Dr. Amaryllis Dyke obtained a JAK2 mutational analysis which was positive. Because his hemoglobin was not over 18 gram, Dr. Allison Quarry did not label his condition as polycythemia vera. Since he had abnormalities of all cell  lines, he also did not label him as having essential thrombocytosis either. He was recommended to start hydroxyurea and aspirin. The goal of hydrea was to decrease his platelet count to less than 600,000 and for the phlebotomies to decrease his hematocrit to 45. He was referred to [place] to be closer to his hometown of [place], [place]. He reports earlier this June, he had an episode of shingles. He also reports a sore right elbow and upper torso following his recent fall. He notes that he gets tired easily.   He was seen by his cardiologist (Dr. Nanetta Batty) who performed Myoview stress testing and 2-D echocardiography all of which were normal. His symptoms have since resolved.  Today, he is doing well overall.   MEDICAL HISTORY: Past Medical History  Diagnosis Date  . Diabetes mellitus without complication   . Coronary artery disease   . Hypertension   . Hyperlipidemia   . Chest pain     INTERIM HISTORY: has DIABETES MELLITUS, TYPE II; HYPERLIPIDEMIA; HYPERTENSION; CORONARY ARTERY DISEASE; URI; GERD; LOW BACK PAIN; Dizziness of unknown cause; Acute renal failure; Leukocytopenia, unspecified; Thrombocytosis; AKI (acute kidney injury); Orthostatic hypotension; Gait instability; Protein-calorie malnutrition, severe; Type II or unspecified type diabetes mellitus with neurological manifestations, not stated as uncontrolled(250.60); Diabetic neuropathy; Cervicalgia; and Myeloproliferative disorder on his problem list.    ALLERGIES:  has No Known Allergies.  MEDICATIONS: has a current medication list which includes the following prescription(s): aspirin, duloxetine, gabapentin, glimepiride, hydroxyurea, l-methylfolate-b6-b12, losartan, metoprolol, fish oil, omeprazole, and simvastatin.  SURGICAL HISTORY:  Past Surgical History  Procedure Laterality Date  .  Eye surgery    . Cardiac surgery    . Doppler echocardiography  06/18/2010    EF =>55%,LV normal  . Nm myocar perf wall motion  05/23/2008     EF 62% ,norm myocardial perfusion  . Cardiac catheterization  11/01/2005    patent stents with normal LV function  . Coronary angioplasty      post LAD and RCA stenting  . Cardiac catheterization  11/08/2003    cypher stent mid dominant right coronary lesion  . Holter monitor  11/08/2005    REVIEW OF SYSTEMS:   Constitutional: Denies fevers, chills or abnormal weight loss Eyes: Denies blurriness of vision Ears, nose, mouth, throat, and face: Denies mucositis or sore throat Respiratory: Denies cough, dyspnea or wheezes Cardiovascular: Denies palpitation, chest discomfort or lower extremity swelling Gastrointestinal:  Denies nausea, heartburn or change in bowel habits Skin: Denies abnormal skin rashes Lymphatics: Denies new lymphadenopathy or easy bruising Neurological:Denies numbness, tingling or new weaknesses Behavioral/Psych: Mood is stable, no new changes  All other systems were reviewed with the patient and are negative.  PHYSICAL EXAMINATION: ECOG PERFORMANCE STATUS: 0 - Asymptomatic  Weight: 206 lbs; Temp 97.3;  HR: 55; RR: 17; BP 141/75.   GENERAL:alert, no distress and comfortable; elderly male who appears his stated age.  SKIN: skin color, texture, turgor are normal, no rashes or significant lesions EYES: normal, Conjunctiva are pink and non-injected, sclera clear OROPHARYNX:no exudate, no erythema and lips, buccal mucosa, and tongue normal  NECK: supple, thyroid normal size, non-tender, without nodularity LYMPH:  no palpable lymphadenopathy in the cervical, axillary or supraclavicular LUNGS: clear to auscultation and percussion with normal breathing effort HEART: regular rate & rhythm and no murmurs and no lower extremity edema ABDOMEN:abdomen soft, non-tender and normal bowel sounds Musculoskeletal:no cyanosis of digits and no clubbing  NEURO: alert & oriented x 3 with fluent speech, no focal motor/sensory deficits   Labs:  Lab Results  Component Value Date    WBC 9.2 11/13/2012   HGB 15.2 11/13/2012   HCT 48.2 11/13/2012   MCV 79.9 11/13/2012   PLT 261 11/13/2012   NEUTROABS 6.0 11/13/2012      Chemistry      Component Value Date/Time   NA 141 11/13/2012 0759   NA 136 06/19/2012 0530   K 4.5 11/13/2012 0759   K 3.9 06/19/2012 0530   CL 102 06/19/2012 0530   CO2 23 11/13/2012 0759   CO2 25 06/19/2012 0530   BUN 21.6 11/13/2012 0759   BUN 20 06/19/2012 0530   CREATININE 0.9 11/13/2012 0759   CREATININE 1.25 06/19/2012 0530      Component Value Date/Time   CALCIUM 9.6 11/13/2012 0759   CALCIUM 9.2 06/19/2012 0530   ALKPHOS 66 11/13/2012 0759   ALKPHOS 46 06/17/2012 1318   AST 23 11/13/2012 0759   AST 19 06/17/2012 1318   ALT 28 11/13/2012 0759   ALT 16 06/17/2012 1318   BILITOT 0.45 11/13/2012 0759   BILITOT 0.2* 06/17/2012 1318     Results for Sproule, Deonte A (MRN [id number]) as of 11/13/2012 08:31  Ref. Range 09/02/2012 14:19  Albumin SerPl Elph-Mcnc Latest Range: 3.2-5.6 g/dL 4.0  Albumin/Glob SerPl Latest Range: 0.7-2.0  1.4  Alpha2 Glob SerPl Elph-Mcnc Latest Range: 0.4-1.2 g/dL 0.7  B-Globulin SerPl Elph-Mcnc Latest Range: 0.6-1.3 g/dL 0.8  Gamma Glob SerPl Elph-Mcnc Latest Range: 0.5-1.6 g/dL 1.3  IgA/Immunoglobulin A, Serum Latest Range: 91-414 mg/dL 409  IgM (Immunoglobin M), Srm Latest Range: 40-230 mg/dL 811 (H)  M Protein SerPl Elph-Mcnc Latest Range: Not Observed g/dL 0.5 (H)  Total Protein Latest Range: 6.0-8.5 g/dL 7.0   Studies:  No results found.   RADIOGRAPHIC STUDIES: No results found.  ASSESSMENT: Keagon A Koenen is a 79 y.o. male with a history of newly diagnosed myeloproliferative disorder and possible IgM kappa MGUS. He also has an elevated vitamin b12. JAK2 is positive. EPO pending. He is negative for classic aquagenic pruritus negative for erythromegalalia.   PLAN:   1. Myeloproliferative disorder. --He meets 1 major PCV study group criteria including an arterial oxygen greater than or equal to 92% and but does not  meet the hemoglobin greater than 18.5; he meets the three minor PCV criteria including elevation of platelets greater than 400,000, white blood cells greater than 12,000, serum vitamin B12 greater than 900. Other criteria which includes a bone marrow biopsy and erythropoietin levels can also be used such as the revised WHO criteria. It requires elevated hbg as noted above plus a positive JAK2 (2 major) and one of the following: Findings on the bone marrow biopsy and a low EPO level. However, he does not have hemoglobin greater than 18.5. We have not checked his EPO level. He doss not have splenomegaly on physical exam. In addition 95 to percent of people with polycythemia vera positive JAK2.  --Due to high suspicion as noted above, I think is , like the referring hematologist, to treat it as a JAK2 myeloproliferative disorder, I recommend to perform for phlebetomy with a goal less than 45% for hematocrit on the CYTO-PV trial (Marchioli R, NEJM 2013). One unit phlebetomy (500 mL) should drop the hematocrit by 3%. I also recommend to continue a baby aspirin (81 mg) daily. Based on his age greater than 60 placing him at a higher thrombotic, we will also continue his hydroxyurea daily as an adjunctive therapy.  Today, his labs demonstrate a normal plt level with hct of 48.2.  We will schedule a phlebetomy today.   He will have repeat labs in 2 weeks and follow up with me in one month.   2. IgM MGUS.  --In addition, his labs reveal an elevated IgM (612) plus kappa. We repeated SPEP, UPEP and Kappa/free light chains today in consideration of MGUS.   3. Follow-up. ----The patient voices understanding of current disease status and treatment options and is in agreement with the current care plan. He should follow up with our clinic in one month and should have a CBC in 2 weeks. He will repeat his CBC in two week to ascertain if a phlebectomy is warranted and if his hydrea dose needs to be further titrated.   --All  questions were answered. The patient knows to call the clinic with any problems, questions or concerns. We can certainly see the patient much sooner if necessary. Last visit, he was provided a handout on hydrea, and polycythemia vera (though he does not completely meet the diagnostic criteria). He also was provided an after visit summary.   I spent 15 minutes counseling the patient face to face. The total time spent in the appointment was 25 minutes.    Demonica Farrey, MD 11/13/2012 9:31 AM

## 2012-11-13 NOTE — Telephone Encounter (Signed)
gv and printed appt sched and avs for opt for NOV and DEC °

## 2012-11-17 LAB — SPEP & IFE WITH QIG
Albumin ELP: 53.9 % — ABNORMAL LOW (ref 55.8–66.1)
Alpha-1-Globulin: 4.2 % (ref 2.9–4.9)
Alpha-2-Globulin: 11.1 % (ref 7.1–11.8)
Beta 2: 3.6 % (ref 3.2–6.5)
Beta Globulin: 5.9 % (ref 4.7–7.2)
Gamma Globulin: 21.3 % — ABNORMAL HIGH (ref 11.1–18.8)
IgA: 163 mg/dL (ref 68–379)
IgG (Immunoglobin G), Serum: 1300 mg/dL (ref 650–1600)
IgM, Serum: 525 mg/dL — ABNORMAL HIGH (ref 41–251)
M-Spike, %: 0.48 g/dL
Total Protein, Serum Electrophoresis: 6.9 g/dL (ref 6.0–8.3)

## 2012-11-17 LAB — KAPPA/LAMBDA LIGHT CHAINS
Kappa free light chain: 3.51 mg/dL — ABNORMAL HIGH (ref 0.33–1.94)
Kappa:Lambda Ratio: 1.85 — ABNORMAL HIGH (ref 0.26–1.65)
Lambda Free Lght Chn: 1.9 mg/dL (ref 0.57–2.63)

## 2012-11-19 LAB — UIFE/LIGHT CHAINS/TP QN, 24-HR UR
Albumin, U: DETECTED
Alpha 1, Urine: DETECTED — AB
Alpha 2, Urine: DETECTED — AB
Beta, Urine: DETECTED — AB
Free Kappa/Lambda Ratio: 37.75 ratio — ABNORMAL HIGH (ref 2.04–10.37)
Free Lambda Lt Chains,Ur: 0.4 mg/dL (ref 0.02–0.67)
Gamma Globulin, Urine: DETECTED — AB
Volume, Urine: 1750 mL

## 2012-11-27 ENCOUNTER — Ambulatory Visit (HOSPITAL_BASED_OUTPATIENT_CLINIC_OR_DEPARTMENT_OTHER): Payer: Medicare Other

## 2012-11-27 DIAGNOSIS — D47Z9 Other specified neoplasms of uncertain behavior of lymphoid, hematopoietic and related tissue: Secondary | ICD-10-CM

## 2012-11-27 DIAGNOSIS — D471 Chronic myeloproliferative disease: Secondary | ICD-10-CM

## 2012-11-27 LAB — CBC WITH DIFFERENTIAL/PLATELET
BASO%: 2 % (ref 0.0–2.0)
Basophils Absolute: 0.2 10*3/uL — ABNORMAL HIGH (ref 0.0–0.1)
Eosinophils Absolute: 0.2 10*3/uL (ref 0.0–0.5)
HGB: 14.1 g/dL (ref 13.0–17.1)
LYMPH%: 20.7 % (ref 14.0–49.0)
MCHC: 31.3 g/dL — ABNORMAL LOW (ref 32.0–36.0)
MONO#: 0.8 10*3/uL (ref 0.1–0.9)
Platelets: 469 10*3/uL — ABNORMAL HIGH (ref 140–400)
RBC: 5.45 10*6/uL (ref 4.20–5.82)
WBC: 8.7 10*3/uL (ref 4.0–10.3)

## 2012-11-28 ENCOUNTER — Other Ambulatory Visit: Payer: Self-pay | Admitting: Internal Medicine

## 2012-12-11 ENCOUNTER — Telehealth: Payer: Self-pay | Admitting: Internal Medicine

## 2012-12-11 ENCOUNTER — Other Ambulatory Visit: Payer: Medicare Other

## 2012-12-11 ENCOUNTER — Ambulatory Visit (HOSPITAL_BASED_OUTPATIENT_CLINIC_OR_DEPARTMENT_OTHER): Payer: Medicare Other | Admitting: Internal Medicine

## 2012-12-11 ENCOUNTER — Other Ambulatory Visit: Payer: Medicare Other | Admitting: Lab

## 2012-12-11 ENCOUNTER — Encounter: Payer: Self-pay | Admitting: Internal Medicine

## 2012-12-11 VITALS — BP 172/73 | HR 55 | Temp 98.1°F | Resp 18 | Ht 72.0 in | Wt 209.5 lb

## 2012-12-11 DIAGNOSIS — D47Z9 Other specified neoplasms of uncertain behavior of lymphoid, hematopoietic and related tissue: Secondary | ICD-10-CM

## 2012-12-11 DIAGNOSIS — D471 Chronic myeloproliferative disease: Secondary | ICD-10-CM

## 2012-12-11 LAB — CBC WITH DIFFERENTIAL/PLATELET
BASO%: 1.8 % (ref 0.0–2.0)
HCT: 46 % (ref 38.4–49.9)
MCH: 26.3 pg — ABNORMAL LOW (ref 27.2–33.4)
MCHC: 32 g/dL (ref 32.0–36.0)
MCV: 82.3 fL (ref 79.3–98.0)
MONO#: 1 10*3/uL — ABNORMAL HIGH (ref 0.1–0.9)
RBC: 5.59 10*6/uL (ref 4.20–5.82)
RDW: 18.8 % — ABNORMAL HIGH (ref 11.0–14.6)
WBC: 8.7 10*3/uL (ref 4.0–10.3)
lymph#: 1.9 10*3/uL (ref 0.9–3.3)
nRBC: 0 % (ref 0–0)

## 2012-12-11 LAB — COMPREHENSIVE METABOLIC PANEL (CC13)
ALT: 24 U/L (ref 0–55)
AST: 24 U/L (ref 5–34)
Albumin: 3.6 g/dL (ref 3.5–5.0)
Anion Gap: 10 mEq/L (ref 3–11)
Calcium: 9.2 mg/dL (ref 8.4–10.4)
Chloride: 105 mEq/L (ref 98–109)
Potassium: 4.3 mEq/L (ref 3.5–5.1)
Sodium: 139 mEq/L (ref 136–145)
Total Protein: 7.3 g/dL (ref 6.4–8.3)

## 2012-12-11 NOTE — Progress Notes (Signed)
[facility] OFFICE PROGRESS NOTE  [name] Grippe, MD [address] [place] [address]  DIAGNOSIS: Myeloproliferative disorder  Chief Complaint  Patient presents with  . Myeloproliferative disorder    CURRENT THERAPY: Hydrea 500 mg on Friday, Sunday, Tuesday and Thursday; 1,000 mg on Sat, Mon and Wednesday; Phlebetomy to maintain hemocrit less than 45%.   INTERVAL HISTORY: [name] 79 y.o. male with history of myeloproliferative disorder is here for follow-up.  He was last seen by me on 10/09/2012.  He was eferred by [name]. He was seen by [name] of the [facility] hematology/oncology in August for persistently elevated white cell count and platelet counts.   As reported previously, [name] has lost about 25 pounds over the past several months with a decline in his health overall. In June of this year, he began having weakness and falls. He was admitted by his PCP ([name]) to [facility] hospital and scans including his abdomen and pelvis and MRI of his abdomen was negative (per the patient and his son). He was persistently iron deficient and mildly anemic prompting further evaluation by [name] in gastroenterology. [name] performed and upper and lower GI endoscopy and there was no evidence of bleeding source and no malignancy. Non-malignant polyps were removed.   In July, there was noticeable elevation of his white cell count with a CBC on 07/21/2012 revealing WBC of 14.3, hemoglobin of 14.1, MV of 75.4 and a platelet count of 774. There was evidence of neutrophilia, lymphocytosis and monocytosis. [name] obtained a JAK2 mutational analysis which was positive. Because his hemoglobin was not over 18 gram, [name] did not label his condition as polycythemia vera. Since he had abnormalities of all cell lines, he also did not label him as having essential thrombocytosis either. He was recommended to start hydroxyurea and  aspirin. The goal of hydrea was to decrease his platelet count to less than 600,000 and for the phlebotomies to decrease his hematocrit to 45. He was referred to [place] to be closer to his hometown of [place], [place]. He reports earlier this June, he had an episode of shingles. He also reports a sore right elbow and upper torso following his recent fall. He notes that he gets tired easily.   He was seen by his cardiologist ([name]) who performed Myoview stress testing and 2-D echocardiography all of which were normal. His symptoms have since resolved.    Today, he is doing well overall. He reports working out at least twice weekly at the [employer].  He notices improvement in his leg strength.  He underwent a phlebotomy two weeks ago.   MEDICAL HISTORY: Past Medical History  Diagnosis Date  . Diabetes mellitus without complication   . Coronary artery disease   . Hypertension   . Hyperlipidemia   . Chest pain     INTERIM HISTORY: has DIABETES MELLITUS, TYPE II; HYPERLIPIDEMIA; HYPERTENSION; CORONARY ARTERY DISEASE; URI; GERD; LOW BACK PAIN; Dizziness of unknown cause; Acute renal failure; Leukocytopenia, unspecified; Thrombocytosis; AKI (acute kidney injury); Orthostatic hypotension; Gait instability; Protein-calorie malnutrition, severe; Type II or unspecified type diabetes mellitus with neurological manifestations, not stated as uncontrolled(250.60); Diabetic neuropathy; Cervicalgia; and Myeloproliferative disorder on his problem list.    ALLERGIES:  has No Known Allergies.  MEDICATIONS: has a current medication list which includes the following prescription(s): aspirin, duloxetine, gabapentin, glimepiride, hydroxyurea, l-methylfolate-b6-b12, losartan, metoprolol, fish oil, simvastatin, and omeprazole.  SURGICAL HISTORY:  Past Surgical History  Procedure Laterality Date  . Eye surgery    . Cardiac surgery    . Doppler echocardiography  06/18/2010    EF =>55%,LV normal  . Nm myocar  perf wall motion  05/23/2008    EF 62% ,norm myocardial perfusion  . Cardiac catheterization  11/01/2005    patent stents with normal LV function  . Coronary angioplasty      post LAD and RCA stenting  . Cardiac catheterization  11/08/2003    cypher stent mid dominant right coronary lesion  . Holter monitor  11/08/2005    REVIEW OF SYSTEMS:   Constitutional: Denies fevers, chills or abnormal weight loss Eyes: Denies blurriness of vision Ears, nose, mouth, throat, and face: Denies mucositis or sore throat Respiratory: Denies cough, dyspnea or wheezes Cardiovascular: Denies palpitation, chest discomfort or lower extremity swelling Gastrointestinal:  Denies nausea, heartburn or change in bowel habits Skin: Denies abnormal skin rashes Lymphatics: Denies new lymphadenopathy or easy bruising Neurological:Denies numbness, tingling or new weaknesses Behavioral/Psych: Mood is stable, no new changes  All other systems were reviewed with the patient and are negative.  PHYSICAL EXAMINATION: ECOG PERFORMANCE STATUS: 0 - Asymptomatic  Weight: 206 lbs; Temp 97.3;  HR: 55; RR: 17; BP 141/75.   GENERAL:alert, no distress and comfortable; elderly male who appears his stated age.  SKIN: skin color, texture, turgor are normal, no rashes or significant lesions EYES: normal, Conjunctiva are pink and non-injected, sclera clear OROPHARYNX:no exudate, no erythema and lips, buccal mucosa, and tongue normal  NECK: supple, thyroid normal size, non-tender, without nodularity LYMPH:  no palpable lymphadenopathy in the cervical, axillary or supraclavicular LUNGS: clear to auscultation and percussion with normal breathing effort HEART: regular rate & rhythm and no murmurs and no lower extremity edema ABDOMEN:abdomen soft, non-tender and normal bowel sounds Musculoskeletal:no cyanosis of digits and no clubbing  NEURO: alert & oriented x 3 with fluent speech, no focal motor/sensory deficits  Labs:  Lab  Results  Component Value Date   WBC 8.7 12/11/2012   HGB 14.7 12/11/2012   HCT 46.0 12/11/2012   MCV 82.3 12/11/2012   PLT 251 12/11/2012   NEUTROABS 5.4 12/11/2012      Chemistry      Component Value Date/Time   NA 139 12/11/2012 0922   NA 136 06/19/2012 0530   K 4.3 12/11/2012 0922   K 3.9 06/19/2012 0530   CL 102 06/19/2012 0530   CO2 24 12/11/2012 0922   CO2 25 06/19/2012 0530   BUN 21.4 12/11/2012 0922   BUN 20 06/19/2012 0530   CREATININE 1.1 12/11/2012 0922   CREATININE 1.25 06/19/2012 0530      Component Value Date/Time   CALCIUM 9.2 12/11/2012 0922   CALCIUM 9.2 06/19/2012 0530   ALKPHOS 80 12/11/2012 0922   ALKPHOS 46 06/17/2012 1318   AST 24 12/11/2012 0922   AST 19 06/17/2012 1318   ALT 24 12/11/2012 0922   ALT 16 06/17/2012 1318   BILITOT 0.43 12/11/2012 0922   BILITOT 0.2* 06/17/2012 1318     Results for Heidenreich, Jezreel A (MRN [id number]) as of 11/13/2012 08:31  Ref. Range 09/02/2012 14:19  Albumin SerPl Elph-Mcnc Latest Range: 3.2-5.6 g/dL 4.0  Albumin/Glob SerPl Latest Range: 0.7-2.0  1.4  Alpha2 Glob SerPl Elph-Mcnc Latest Range: 0.4-1.2 g/dL 0.7  B-Globulin SerPl Elph-Mcnc Latest Range: 0.6-1.3 g/dL 0.8  Gamma Glob SerPl Elph-Mcnc Latest Range: 0.5-1.6 g/dL 1.3  IgA/Immunoglobulin A, Serum Latest Range: 91-414 mg/dL 213  IgM (Immunoglobin  M), Srm Latest Range: 40-230 mg/dL 147 (H)  M Protein SerPl Elph-Mcnc Latest Range: Not Observed g/dL 0.5 (H)  Total Protein Latest Range: 6.0-8.5 g/dL 7.0   Studies:  No results found.   RADIOGRAPHIC STUDIES: No results found.  ASSESSMENT: Kohen A Hartsell is a 79 y.o. male with a history of newly diagnosed myeloproliferative disorder and possible IgM kappa MGUS. He also has an elevated vitamin b12. JAK2 is positive. EPO pending. He is negative for classic aquagenic pruritus negative for erythromegalalia.   PLAN:   1. Myeloproliferative disorder. --He meets 1 major PCV study group criteria including an arterial oxygen greater than or  equal to 92% and but does not meet the hemoglobin greater than 18.5; he meets the three minor PCV criteria including elevation of platelets greater than 400,000, white blood cells greater than 12,000, serum vitamin B12 greater than 900. Other criteria which includes a bone marrow biopsy and erythropoietin levels can also be used such as the revised WHO criteria. It requires elevated hbg as noted above plus a positive JAK2 (2 major) and one of the following: Findings on the bone marrow biopsy and a low EPO level. However, he does not have hemoglobin greater than 18.5. We have not checked his EPO level. He doss not have splenomegaly on physical exam. In addition 95 to percent of people with polycythemia vera positive JAK2.  --Due to high suspicion as noted above, I think is , like the referring hematologist, to treat it as a JAK2 myeloproliferative disorder, I recommended to perform for phlebetomy with a goal less than 45% for hematocrit on the CYTO-PV trial (Marchioli R, NEJM 2013). One unit phlebetomy (500 mL) should drop the hematocrit by 3%. I also recommend to continue a baby aspirin (81 mg) daily. Based on his age greater than 60 placing him at a higher thrombotic, we will also continue his hydroxyurea daily as an adjunctive therapy.  Today, his labs demonstrate a normal plt level with hct of 46 slightly up from 45.1.  At his request, we will hold phlebotomy today and schedule for repeat labs in 2 weeks.  We also advised him to increase his hydrea dose to 11 tabs weekly instead of 10 weekly.  We will schedule a phlebetomy in two weeks if his hematocrit is greater than 45%.  Marland Kitchen   He will have repeat labs in 2 weeks and follow up with me in one month.   2. IgM MGUS. --In addition, his labs reveal an elevated IgM (612) plus kappa. We repeated SPEP, UPEP and Kappa/free light chains today in consideration of MGUS.   3. Follow-up. ----The patient voices understanding of current disease status and treatment  options and is in agreement with the current care plan. He should follow up with our clinic in one month and should have a CBC in 2 weeks. He will repeat his CBC in two week to ascertain if a phlebectomy is warranted and if his hydrea dose needs to be further titrated.   --All questions were answered. The patient knows to call the clinic with any problems, questions or concerns. We can certainly see the patient much sooner if necessary. Last visit, he was provided a handout on hydrea, and polycythemia vera (though he does not completely meet the diagnostic criteria). He also was provided an after visit summary.   I spent 15 minutes counseling the patient face to face. The total time spent in the appointment was 25 minutes.    Sorcha Rotunno, MD 12/11/2012  10:01 AM

## 2012-12-11 NOTE — Telephone Encounter (Signed)
gv and printed appt sched and avs for pt for DEC and Jan 2015...sed added tx.   °

## 2012-12-11 NOTE — Patient Instructions (Signed)
Increase hydrea to 2 tabs (four times a week) and 1 tab three times a week.  Total 11 tabs weeks.   Hydroxyurea capsules What is this medicine? HYDROXYUREA (hye drox ee yoor EE a) is a chemotherapy drug. It slows the growth of cancer cells. This medicine is used to treat certain leukemias, skin cancer, head and neck cancer, and advanced ovarian cancer. It is also used to control the painful crises of sickle cell anemia. This medicine may be used for other purposes; ask your health care provider or pharmacist if you have questions. COMMON BRAND NAME(S): Droxia, Hydrea What should I tell my health care provider before I take this medicine? They need to know if you have any of these conditions: -immune system problems -infection (especially a virus infection such as chickenpox, cold sores, or herpes) -kidney disease -low blood counts, like low white cell, platelet, or red cell counts -previous or ongoing radiation therapy -an unusual or allergic reaction to hydroxyurea, other chemotherapy, other medicines, foods, dyes, or preservatives -pregnant or trying to get pregnant -breast-feeding How should I use this medicine? Take this medicine by mouth with a glass of water. Follow the directions on the prescription label. Take your medicine at regular intervals. Do not take it more often than directed. Do not stop taking except on your doctor's advice. People who are not taking this medicine should not be exposed to it. Wash your hands before and after handling your bottle or medicine. Caregivers should wear disposable gloves if they must touch the bottle or medicine. Clean up any medicine powder that spills with a damp disposable towel and throw the towel away in a closed container, such as a plastic bag. Talk to your pediatrician regarding the use of this medicine in children. Special care may be needed. Patients over 72 years old may have a stronger reaction and need a smaller dose. Overdosage: If  you think you have taken too much of this medicine contact a poison control center or emergency room at once. NOTE: This medicine is only for you. Do not share this medicine with others. What if I miss a dose? If you miss a dose, take it as soon as you can. If it is almost time for your next dose, take only that dose. Do not take double or extra doses. What may interact with this medicine? -didanosine -other chemotherapy agents -stavudine -tenofovir -vaccines This list may not describe all possible interactions. Give your health care provider a list of all the medicines, herbs, non-prescription drugs, or dietary supplements you use. Also tell them if you smoke, drink alcohol, or use illegal drugs. Some items may interact with your medicine. What should I watch for while using this medicine? This drug may make you feel generally unwell. This is not uncommon, as chemotherapy can affect healthy cells as well as cancer cells. Report any side effects. Continue your course of treatment even though you feel ill unless your doctor tells you to stop. You will receive regular blood tests during your treatment. Call your doctor or health care professional for advice if you get a fever, chills or sore throat, or other symptoms of a cold or flu. Do not treat yourself. This drug decreases your body's ability to fight infections. Try to avoid being around people who are sick. This medicine may increase your risk to bruise or bleed. Call your doctor or health care professional if you notice any unusual bleeding. Be careful brushing and flossing your teeth or using a  toothpick because you may get an infection or bleed more easily. If you have any dental work done, tell your dentist you are receiving this medicine. Avoid taking products that contain aspirin, acetaminophen, ibuprofen, naproxen, or ketoprofen unless instructed by your doctor. These medicines may hide a fever. Do not become pregnant while taking this  medicine. Women should inform their doctor if they wish to become pregnant or think they might be pregnant. There is a potential for serious side effects to an unborn child. Men should inform their doctors if they wish to father a child. This medicine may lower sperm counts. Talk to your health care professional or pharmacist for more information. Do not breast-feed an infant while taking this medicine. What side effects may I notice from receiving this medicine? Side effects that you should report to your doctor or health care professional as soon as possible: -allergic reactions like skin rash, itching or hives, swelling of the face, lips, or tongue -low blood counts - this medicine may decrease the number of white blood cells, red blood cells and platelets. You may be at increased risk for infections and bleeding. -signs of infection - fever or chills, cough, sore throat, pain or difficulty passing urine -signs of decreased platelets or bleeding - bruising, pinpoint red spots on the skin, black, tarry stools, blood in the urine -signs of decreased red blood cells - unusually weak or tired, fainting spells, lightheadedness -breathing problems -burning, redness or pain at the site of any radiation therapy -changes in skin color -confusion -mouth sores -pain, tingling, numbness in the hands or feet -seizures -skin ulcers -trouble passing urine or change in the amount of urine -vomiting Side effects that usually do not require medical attention (report to your doctor or health care professional if they continue or are bothersome): -headache -loss of appetite -red color to the face This list may not describe all possible side effects. Call your doctor for medical advice about side effects. You may report side effects to FDA at 1-800-FDA-1088. Where should I keep my medicine? Keep out of the reach of children. Store at room temperature between 15 and 30 degrees C (59 and 86 degrees F). Keep  tightly closed. Throw away any unused medicine after the expiration date. NOTE: This sheet is a summary. It may not cover all possible information. If you have questions about this medicine, talk to your doctor, pharmacist, or health care provider.  2014, [employer]vier/Gold Standard. (2007-05-08 15:03:29)

## 2012-12-15 NOTE — Progress Notes (Signed)
Received office notes from Keturah Barre MD from visit on 12/10/2012.  Pt's sinuses are under excellent condition; the right maxillary sinus has been the most notable as a problem.  Pt does not want hearing aids at this time.  Follow up in 6 months.  Sent to scan.

## 2012-12-25 ENCOUNTER — Other Ambulatory Visit (HOSPITAL_BASED_OUTPATIENT_CLINIC_OR_DEPARTMENT_OTHER): Payer: Medicare Other

## 2012-12-25 ENCOUNTER — Ambulatory Visit: Payer: Medicare Other

## 2012-12-25 DIAGNOSIS — D471 Chronic myeloproliferative disease: Secondary | ICD-10-CM

## 2012-12-25 DIAGNOSIS — D47Z9 Other specified neoplasms of uncertain behavior of lymphoid, hematopoietic and related tissue: Secondary | ICD-10-CM

## 2012-12-25 LAB — CBC WITH DIFFERENTIAL/PLATELET
Basophils Absolute: 0.2 10*3/uL — ABNORMAL HIGH (ref 0.0–0.1)
EOS%: 1.8 % (ref 0.0–7.0)
MCH: 26.9 pg — ABNORMAL LOW (ref 27.2–33.4)
MCHC: 32.5 g/dL (ref 32.0–36.0)
MCV: 83 fL (ref 79.3–98.0)
MONO%: 10.4 % (ref 0.0–14.0)
RBC: 5.53 10*6/uL (ref 4.20–5.82)
RDW: 18.3 % — ABNORMAL HIGH (ref 11.0–14.6)
lymph#: 1.5 10*3/uL (ref 0.9–3.3)

## 2012-12-25 NOTE — Progress Notes (Signed)
Phlebotomy not done today, patient feels fine, hct 45.9. Okay with dr Rosie Fate to hold for today.

## 2013-01-02 ENCOUNTER — Other Ambulatory Visit: Payer: Self-pay | Admitting: Internal Medicine

## 2013-01-08 ENCOUNTER — Encounter: Payer: Self-pay | Admitting: Internal Medicine

## 2013-01-08 ENCOUNTER — Telehealth: Payer: Self-pay | Admitting: Internal Medicine

## 2013-01-08 ENCOUNTER — Encounter (INDEPENDENT_AMBULATORY_CARE_PROVIDER_SITE_OTHER): Payer: Self-pay

## 2013-01-08 ENCOUNTER — Other Ambulatory Visit (HOSPITAL_BASED_OUTPATIENT_CLINIC_OR_DEPARTMENT_OTHER): Payer: Medicare Other

## 2013-01-08 ENCOUNTER — Ambulatory Visit (HOSPITAL_BASED_OUTPATIENT_CLINIC_OR_DEPARTMENT_OTHER): Payer: Medicare Other | Admitting: Internal Medicine

## 2013-01-08 VITALS — BP 154/64 | HR 60 | Temp 97.4°F | Resp 18 | Ht 72.0 in | Wt 213.9 lb

## 2013-01-08 DIAGNOSIS — D47Z9 Other specified neoplasms of uncertain behavior of lymphoid, hematopoietic and related tissue: Secondary | ICD-10-CM

## 2013-01-08 DIAGNOSIS — D471 Chronic myeloproliferative disease: Secondary | ICD-10-CM

## 2013-01-08 DIAGNOSIS — D472 Monoclonal gammopathy: Secondary | ICD-10-CM | POA: Insufficient documentation

## 2013-01-08 LAB — CBC WITH DIFFERENTIAL/PLATELET
BASO%: 2.5 % — ABNORMAL HIGH (ref 0.0–2.0)
BASOS ABS: 0.2 10*3/uL — AB (ref 0.0–0.1)
EOS%: 2.4 % (ref 0.0–7.0)
Eosinophils Absolute: 0.2 10*3/uL (ref 0.0–0.5)
HEMATOCRIT: 45.2 % (ref 38.4–49.9)
HEMOGLOBIN: 14.5 g/dL (ref 13.0–17.1)
LYMPH%: 22.2 % (ref 14.0–49.0)
MCH: 27.8 pg (ref 27.2–33.4)
MCHC: 32 g/dL (ref 32.0–36.0)
MCV: 86.8 fL (ref 79.3–98.0)
MONO#: 0.8 10*3/uL (ref 0.1–0.9)
MONO%: 10.5 % (ref 0.0–14.0)
NEUT#: 4.9 10*3/uL (ref 1.5–6.5)
NEUT%: 62.4 % (ref 39.0–75.0)
Platelets: 270 10*3/uL (ref 140–400)
RBC: 5.2 10*6/uL (ref 4.20–5.82)
RDW: 19.1 % — ABNORMAL HIGH (ref 11.0–14.6)
WBC: 7.9 10*3/uL (ref 4.0–10.3)
lymph#: 1.8 10*3/uL (ref 0.9–3.3)

## 2013-01-08 LAB — COMPREHENSIVE METABOLIC PANEL (CC13)
ALT: 21 U/L (ref 0–55)
ANION GAP: 11 meq/L (ref 3–11)
AST: 22 U/L (ref 5–34)
Albumin: 3.4 g/dL — ABNORMAL LOW (ref 3.5–5.0)
Alkaline Phosphatase: 65 U/L (ref 40–150)
BUN: 24.7 mg/dL (ref 7.0–26.0)
CALCIUM: 9 mg/dL (ref 8.4–10.4)
CHLORIDE: 106 meq/L (ref 98–109)
CO2: 22 mEq/L (ref 22–29)
Creatinine: 1.2 mg/dL (ref 0.7–1.3)
Glucose: 143 mg/dl — ABNORMAL HIGH (ref 70–140)
Potassium: 4.3 mEq/L (ref 3.5–5.1)
Sodium: 139 mEq/L (ref 136–145)
Total Bilirubin: 0.42 mg/dL (ref 0.20–1.20)
Total Protein: 7.1 g/dL (ref 6.4–8.3)

## 2013-01-08 NOTE — Progress Notes (Signed)
[name] OFFICE PROGRESS NOTE  [name] Gravel, MD [place] [address]  DIAGNOSIS: Myeloproliferative disorder - Plan: CBC with Differential, Comprehensive metabolic panel (Cmet) - [facility], CBC with Differential in 1 month, CBC with Differential in 2 months  Monoclonal gammopathy of unknown significance - Plan: Kappa/lambda light chains, SPEP & IFE with QIG, IFE, Urine (with Tot Prot), Immunofixation interpretive, urine  Chief Complaint  Patient presents with  . Myeloproliferative disorder    CURRENT THERAPY: Hydrea 1,000 mg on Friday, Sunday, Tuesday and Thursday; 500 mg on Sat, Mon and Wednesday; Phlebetomy to maintain hemocrit less than 45%.   INTERVAL HISTORY: [name] 80 y.o. male with history of myeloproliferative disorder is here for follow-up.  He was last seen by me on 12/11/2012.  He was referred by [name] initially. He was seen by [name] of the [facility] hematology/oncology in August for persistently elevated white cell count and platelet counts.   As reported previously, [name] has lost about 25 pounds over the past several months with a decline in his health overall. In June of this year, he began having weakness and falls. He was admitted by his PCP ([name]) to [facility] hospital and scans including his abdomen and pelvis and MRI of his abdomen was negative (per the patient and his son). He was persistently iron deficient and mildly anemic prompting further evaluation by [name] in gastroenterology. [name] performed and upper and lower GI endoscopy and there was no evidence of bleeding source and no malignancy. Non-malignant polyps were removed.   In July, there was noticeable elevation of his white cell count with a CBC on 07/21/2012 revealing WBC of 14.3, hemoglobin of 14.1, MV of 75.4 and a platelet count of 774. There was evidence of neutrophilia, lymphocytosis and monocytosis. Dr.  Flora [name] obtained a JAK2 mutational analysis which was positive. Because his hemoglobin was not over 18 gram, [name] did not label his condition as polycythemia vera. Since he had abnormalities of all cell lines, he also did not label him as having essential thrombocytosis either. He was recommended to start hydroxyurea and aspirin. The goal of hydrea was to decrease his platelet count to less than 600,000 and for the phlebotomies to decrease his hematocrit to 45. He was referred to [place] to be closer to his hometown of [place], [place]. He reports earlier this June, he had an episode of shingles. He also reports a sore right elbow and upper torso following his recent fall. He notes that he gets tired easily.   He was seen by his cardiologist (Dr. Quay Burow) who performed Myoview stress testing and 2-D echocardiography all of which were normal. His symptoms have since resolved.    Today, he is doing well overall. He reports working out at least twice weekly at the [employer].  He notices improvement in his leg strength.  He underwent a phlebotomy 6 weeks ago. He reports having a male friend and being more active.    MEDICAL HISTORY: Past Medical History  Diagnosis Date  . Diabetes mellitus without complication   . Coronary artery disease   . Hypertension   . Hyperlipidemia   . Chest pain     INTERIM HISTORY: has DIABETES MELLITUS, TYPE II; HYPERLIPIDEMIA; HYPERTENSION; CORONARY ARTERY DISEASE; URI; GERD; LOW BACK PAIN; Dizziness of unknown cause; Acute renal failure; Leukocytopenia, unspecified; Thrombocytosis; AKI (acute kidney injury); Orthostatic hypotension; Gait instability; Protein-calorie malnutrition, severe; Type II  or unspecified type diabetes mellitus with neurological manifestations, not stated as uncontrolled(250.60); Diabetic neuropathy; Cervicalgia; Myeloproliferative disorder; and Monoclonal gammopathy of unknown significance on his problem list.    ALLERGIES:  has No Known  Allergies.  MEDICATIONS: has a current medication list which includes the following prescription(s): aspirin, duloxetine, gabapentin, glimepiride, hydroxyurea, l-methylfolate-b6-b12, losartan, metoprolol, fish oil, omeprazole, and simvastatin.  SURGICAL HISTORY:  Past Surgical History  Procedure Laterality Date  . Eye surgery    . Cardiac surgery    . Doppler echocardiography  06/18/2010    EF =>55%,LV normal  . Nm myocar perf wall motion  05/23/2008    EF 62% ,norm myocardial perfusion  . Cardiac catheterization  11/01/2005    patent stents with normal LV function  . Coronary angioplasty      post LAD and RCA stenting  . Cardiac catheterization  11/08/2003    cypher stent mid dominant right coronary lesion  . Holter monitor  11/08/2005    REVIEW OF SYSTEMS:   Constitutional: Denies fevers, chills or abnormal weight loss Eyes: Denies blurriness of vision Ears, nose, mouth, throat, and face: Denies mucositis or sore throat Respiratory: Denies cough, dyspnea or wheezes Cardiovascular: Denies palpitation, chest discomfort or lower extremity swelling Gastrointestinal:  Denies nausea, heartburn or change in bowel habits Skin: Denies abnormal skin rashes Lymphatics: Denies new lymphadenopathy or easy bruising Neurological:Denies numbness, tingling or new weaknesses Behavioral/Psych: Mood is stable, no new changes  All other systems were reviewed with the patient and are negative.  PHYSICAL EXAMINATION: ECOG PERFORMANCE STATUS: 0 - Asymptomatic  BP 154/64  Pulse 60  Temp(Src) 97.4 F (36.3 C) (Oral)  Resp 18  Ht 6' (1.829 m)  Wt 213 lb 14.4 oz (97.024 kg)  BMI 29.00 kg/m2  SpO2 100%  GENERAL:alert, no distress and comfortable; elderly male who appears his stated age.  SKIN: skin color, texture, turgor are normal, no rashes or significant lesions EYES: normal, Conjunctiva are pink and non-injected, sclera clear OROPHARYNX:no exudate, no erythema and lips, buccal mucosa, and  tongue normal  NECK: supple, thyroid normal size, non-tender, without nodularity LYMPH:  no palpable lymphadenopathy in the cervical, axillary or supraclavicular LUNGS: clear to auscultation and percussion with normal breathing effort HEART: regular rate & rhythm and no murmurs and no lower extremity edema ABDOMEN:abdomen soft, non-tender and normal bowel sounds Musculoskeletal:no cyanosis of digits and no clubbing  NEURO: alert & oriented x 3 with fluent speech, no focal motor/sensory deficits  Labs:  Lab Results  Component Value Date   WBC 7.9 01/08/2013   HGB 14.5 01/08/2013   HCT 45.2 01/08/2013   MCV 86.8 01/08/2013   PLT 270 01/08/2013   NEUTROABS 4.9 01/08/2013      Chemistry      Component Value Date/Time   NA 139 01/08/2013 0837   NA 136 06/19/2012 0530   K 4.3 01/08/2013 0837   K 3.9 06/19/2012 0530   CL 102 06/19/2012 0530   CO2 22 01/08/2013 0837   CO2 25 06/19/2012 0530   BUN 24.7 01/08/2013 0837   BUN 20 06/19/2012 0530   CREATININE 1.2 01/08/2013 0837   CREATININE 1.25 06/19/2012 0530      Component Value Date/Time   CALCIUM 9.0 01/08/2013 0837   CALCIUM 9.2 06/19/2012 0530   ALKPHOS 65 01/08/2013 0837   ALKPHOS 46 06/17/2012 1318   AST 22 01/08/2013 0837   AST 19 06/17/2012 1318   ALT 21 01/08/2013 0837   ALT 16 06/17/2012 1318   BILITOT  0.42 01/08/2013 0837   BILITOT 0.2* 06/17/2012 1318     Results for BACH, ROCCHI (MRN [id number]) as of 11/13/2012 08:31  Ref. Range 09/02/2012 14:19  Albumin SerPl Elph-Mcnc Latest Range: 3.2-5.6 g/dL 4.0  Albumin/Glob SerPl Latest Range: 0.7-2.0  1.4  Alpha2 Glob SerPl Elph-Mcnc Latest Range: 0.4-1.2 g/dL 0.7  B-Globulin SerPl Elph-Mcnc Latest Range: 0.6-1.3 g/dL 0.8  Gamma Glob SerPl Elph-Mcnc Latest Range: 0.5-1.6 g/dL 1.3  IgA/Immunoglobulin A, Serum Latest Range: 91-414 mg/dL 151  IgM (Immunoglobin M), Srm Latest Range: 40-230 mg/dL 612 (H)  M Protein SerPl Elph-Mcnc Latest Range: Not Observed g/dL 0.5 (H)  Total Protein Latest Range: 6.0-8.5  g/dL 7.0   Studies:  No results found.   RADIOGRAPHIC STUDIES: No results found.  ASSESSMENT: Miner A Lawrance is a 80 y.o. male with a history of newly diagnosed myeloproliferative disorder and possible IgM kappa MGUS. He also has an elevated vitamin b12. JAK2 is positive. EPO pending. He is negative for classic aquagenic pruritus negative for erythromegalalia.   PLAN:   1. Myeloproliferative disorder. --He meets 1 major PCV study group criteria including an arterial oxygen greater than or equal to 92% and but does not meet the hemoglobin greater than 18.5; he meets the three minor PCV criteria including elevation of platelets greater than 400,000, white blood cells greater than 12,000, serum vitamin B12 greater than 900. Other criteria which includes a bone marrow biopsy and erythropoietin levels can also be used such as the revised WHO criteria. It requires elevated hbg as noted above plus a positive JAK2 (2 major) and one of the following: Findings on the bone marrow biopsy and a low EPO level. However, he does not have hemoglobin greater than 18.5. We have not checked his EPO level. He doss not have splenomegaly on physical exam. In addition 95 to percent of people with polycythemia vera positive JAK2.   --Due to high suspicion as noted above, I think is , like the referring hematologist, to treat it as a JAK2 myeloproliferative disorder, I recommended to perform for phlebetomy with a goal less than 45% for hematocrit on the CYTO-PV trial (Groom R, NEJM 2013). One unit phlebetomy (500 mL) should drop the hematocrit by 3%. I also recommend to continue a baby aspirin (81 mg) daily. Based on his age greater than 70 placing him at a higher thrombotic, we will also continue his hydroxyurea daily as an adjunctive therapy.  Today, his labs demonstrate a normal plt level with hct of 45.2  At his request, we will hold phlebotomy today and schedule for repeat labs in one month.  We also advised him to  continue his hydrea dose to 11 tabs weekly instead of 10 weekly.  We will schedule a phlebetomy in month if his hematocrit is greater than 45%.     He will have repeat labs monthly follow up with me in three months.   2. IgM kappa MGUS. --In addition, his labs reveal an elevated IgM (612) plus kappa. We repeated SPEP, UPEP and Kappa/free light chains today in consideration of MGUS.   3. Follow-up. ----The patient voices understanding of current disease status and treatment options and is in agreement with the current care plan. He should follow up with our clinic in three months and should have a CBC in monthly. He will repeat his CBC monthly to ascertain if a phlebectomy is warranted and if his hydrea dose needs to be further titrated.   --All questions were answered. The patient knows  to call the clinic with any problems, questions or concerns. We can certainly see the patient much sooner if necessary. Last visit, he was provided a handout on hydrea, and polycythemia vera (though he does not completely meet the diagnostic criteria). He also was provided an after visit summary.   I spent 15 minutes counseling the patient face to face. The total time spent in the appointment was 25 minutes.    Brion Hedges, MD 01/08/2013 10:00 AM

## 2013-01-08 NOTE — Telephone Encounter (Signed)
appts made per 01/08/13 POF AVS and CAL given shh °

## 2013-01-19 ENCOUNTER — Other Ambulatory Visit: Payer: Self-pay | Admitting: Physician Assistant

## 2013-01-19 NOTE — Telephone Encounter (Signed)
Rx was sent to pharmacy electronically. 

## 2013-01-30 ENCOUNTER — Other Ambulatory Visit: Payer: Self-pay | Admitting: Internal Medicine

## 2013-02-12 ENCOUNTER — Other Ambulatory Visit (HOSPITAL_BASED_OUTPATIENT_CLINIC_OR_DEPARTMENT_OTHER): Payer: Medicare Other

## 2013-02-12 DIAGNOSIS — D47Z9 Other specified neoplasms of uncertain behavior of lymphoid, hematopoietic and related tissue: Secondary | ICD-10-CM

## 2013-02-12 DIAGNOSIS — D471 Chronic myeloproliferative disease: Secondary | ICD-10-CM

## 2013-02-12 LAB — CBC WITH DIFFERENTIAL/PLATELET
BASO%: 1.5 % (ref 0.0–2.0)
Basophils Absolute: 0.1 10*3/uL (ref 0.0–0.1)
EOS ABS: 0.1 10*3/uL (ref 0.0–0.5)
EOS%: 1.9 % (ref 0.0–7.0)
HCT: 45.6 % (ref 38.4–49.9)
HGB: 14.6 g/dL (ref 13.0–17.1)
LYMPH%: 22.1 % (ref 14.0–49.0)
MCH: 29.1 pg (ref 27.2–33.4)
MCHC: 32.1 g/dL (ref 32.0–36.0)
MCV: 90.6 fL (ref 79.3–98.0)
MONO#: 0.8 10*3/uL (ref 0.1–0.9)
MONO%: 10.7 % (ref 0.0–14.0)
NEUT#: 5 10*3/uL (ref 1.5–6.5)
NEUT%: 63.8 % (ref 39.0–75.0)
Platelets: 258 10*3/uL (ref 140–400)
RBC: 5.03 10*6/uL (ref 4.20–5.82)
RDW: 17 % — AB (ref 11.0–14.6)
WBC: 7.8 10*3/uL (ref 4.0–10.3)
lymph#: 1.7 10*3/uL (ref 0.9–3.3)

## 2013-02-22 ENCOUNTER — Telehealth: Payer: Self-pay | Admitting: Internal Medicine

## 2013-02-22 NOTE — Telephone Encounter (Signed)
Called pt and left message regarding apt r/s from 4/2 to 4/17, mailed appt to pt

## 2013-03-01 ENCOUNTER — Other Ambulatory Visit: Payer: Self-pay | Admitting: Internal Medicine

## 2013-03-06 ENCOUNTER — Other Ambulatory Visit: Payer: Self-pay | Admitting: Physician Assistant

## 2013-03-10 ENCOUNTER — Telehealth: Payer: Self-pay | Admitting: Internal Medicine

## 2013-03-10 NOTE — Telephone Encounter (Signed)
pt called to r/s lab to nxt week due to moving ...done

## 2013-03-12 ENCOUNTER — Other Ambulatory Visit: Payer: Medicare Other

## 2013-03-19 ENCOUNTER — Other Ambulatory Visit (HOSPITAL_BASED_OUTPATIENT_CLINIC_OR_DEPARTMENT_OTHER): Payer: Medicare Other

## 2013-03-19 DIAGNOSIS — D471 Chronic myeloproliferative disease: Secondary | ICD-10-CM

## 2013-03-19 DIAGNOSIS — D47Z9 Other specified neoplasms of uncertain behavior of lymphoid, hematopoietic and related tissue: Secondary | ICD-10-CM

## 2013-03-19 LAB — CBC WITH DIFFERENTIAL/PLATELET
BASO%: 1.4 % (ref 0.0–2.0)
Basophils Absolute: 0.1 10*3/uL (ref 0.0–0.1)
EOS%: 1.5 % (ref 0.0–7.0)
Eosinophils Absolute: 0.1 10*3/uL (ref 0.0–0.5)
HEMATOCRIT: 47.7 % (ref 38.4–49.9)
HGB: 15.3 g/dL (ref 13.0–17.1)
LYMPH%: 19.6 % (ref 14.0–49.0)
MCH: 29.5 pg (ref 27.2–33.4)
MCHC: 32.1 g/dL (ref 32.0–36.0)
MCV: 91.9 fL (ref 79.3–98.0)
MONO#: 1 10*3/uL — AB (ref 0.1–0.9)
MONO%: 11.2 % (ref 0.0–14.0)
NEUT#: 6.2 10*3/uL (ref 1.5–6.5)
NEUT%: 66.3 % (ref 39.0–75.0)
Platelets: 289 10*3/uL (ref 140–400)
RBC: 5.19 10*6/uL (ref 4.20–5.82)
RDW: 15 % — ABNORMAL HIGH (ref 11.0–14.6)
WBC: 9.3 10*3/uL (ref 4.0–10.3)
lymph#: 1.8 10*3/uL (ref 0.9–3.3)

## 2013-03-23 ENCOUNTER — Other Ambulatory Visit: Payer: Self-pay | Admitting: Medical Oncology

## 2013-03-23 MED ORDER — HYDROXYUREA 500 MG PO CAPS: ORAL_CAPSULE | ORAL | Status: DC

## 2013-04-09 ENCOUNTER — Other Ambulatory Visit: Payer: Medicare Other

## 2013-04-09 ENCOUNTER — Ambulatory Visit: Payer: Medicare Other

## 2013-04-23 ENCOUNTER — Ambulatory Visit (HOSPITAL_BASED_OUTPATIENT_CLINIC_OR_DEPARTMENT_OTHER): Payer: Medicare Other | Admitting: Internal Medicine

## 2013-04-23 ENCOUNTER — Ambulatory Visit (HOSPITAL_BASED_OUTPATIENT_CLINIC_OR_DEPARTMENT_OTHER): Payer: Medicare Other

## 2013-04-23 ENCOUNTER — Telehealth: Payer: Self-pay | Admitting: Internal Medicine

## 2013-04-23 ENCOUNTER — Other Ambulatory Visit (HOSPITAL_BASED_OUTPATIENT_CLINIC_OR_DEPARTMENT_OTHER): Payer: Medicare Other

## 2013-04-23 VITALS — BP 126/61 | HR 60 | Temp 97.5°F | Resp 18 | Ht 72.0 in | Wt 214.4 lb

## 2013-04-23 DIAGNOSIS — D471 Chronic myeloproliferative disease: Secondary | ICD-10-CM

## 2013-04-23 DIAGNOSIS — D472 Monoclonal gammopathy: Secondary | ICD-10-CM

## 2013-04-23 DIAGNOSIS — D47Z9 Other specified neoplasms of uncertain behavior of lymphoid, hematopoietic and related tissue: Secondary | ICD-10-CM

## 2013-04-23 LAB — COMPREHENSIVE METABOLIC PANEL (CC13)
ALT: 13 U/L (ref 0–55)
ANION GAP: 9 meq/L (ref 3–11)
AST: 13 U/L (ref 5–34)
Albumin: 3.6 g/dL (ref 3.5–5.0)
Alkaline Phosphatase: 82 U/L (ref 40–150)
BILIRUBIN TOTAL: 0.48 mg/dL (ref 0.20–1.20)
BUN: 19.6 mg/dL (ref 7.0–26.0)
CO2: 24 meq/L (ref 22–29)
Calcium: 9.5 mg/dL (ref 8.4–10.4)
Chloride: 105 mEq/L (ref 98–109)
Creatinine: 1.3 mg/dL (ref 0.7–1.3)
Glucose: 169 mg/dl — ABNORMAL HIGH (ref 70–140)
Potassium: 4.5 mEq/L (ref 3.5–5.1)
Sodium: 138 mEq/L (ref 136–145)
Total Protein: 7.4 g/dL (ref 6.4–8.3)

## 2013-04-23 LAB — CBC WITH DIFFERENTIAL/PLATELET
BASO%: 0.2 % (ref 0.0–2.0)
Basophils Absolute: 0 10*3/uL (ref 0.0–0.1)
EOS%: 7 % (ref 0.0–7.0)
Eosinophils Absolute: 0.9 10*3/uL — ABNORMAL HIGH (ref 0.0–0.5)
HCT: 47.8 % (ref 38.4–49.9)
HGB: 15.2 g/dL (ref 13.0–17.1)
LYMPH%: 15.7 % (ref 14.0–49.0)
MCH: 29.2 pg (ref 27.2–33.4)
MCHC: 31.9 g/dL — AB (ref 32.0–36.0)
MCV: 91.6 fL (ref 79.3–98.0)
MONO#: 1.1 10*3/uL — ABNORMAL HIGH (ref 0.1–0.9)
MONO%: 9.1 % (ref 0.0–14.0)
NEUT#: 8.5 10*3/uL — ABNORMAL HIGH (ref 1.5–6.5)
NEUT%: 68 % (ref 39.0–75.0)
PLATELETS: 346 10*3/uL (ref 140–400)
RBC: 5.22 10*6/uL (ref 4.20–5.82)
RDW: 13.9 % (ref 11.0–14.6)
WBC: 12.5 10*3/uL — AB (ref 4.0–10.3)
lymph#: 2 10*3/uL (ref 0.9–3.3)

## 2013-04-23 MED ORDER — HYDROXYUREA 500 MG PO CAPS: 500.0000 mg | ORAL_CAPSULE | Freq: Two times a day (BID) | ORAL | Status: DC

## 2013-04-23 NOTE — Progress Notes (Signed)
[facility] OFFICE PROGRESS NOTE  KIM, JAMES, MD [address] [place] [address]  DIAGNOSIS: Myeloproliferative disorder  Monoclonal gammopathy of unknown significance  Chief Complaint  Patient presents with  . Myeloproliferative disorder    CURRENT THERAPY: Hydrea 1,000 mg on Friday, Sunday, Tuesday and Thursday; 500 mg on Sat, Mon and Wednesday; Phlebetomy to maintain hemocrit less than 45%.   INTERVAL HISTORY: [name] 80 y.o. male with history of myeloproliferative disorder is here for follow-up.  He was last seen by me on 01/08/2013  He was referred by Dr. Vallathucherry Harish initially. He was seen by Dr. Harish of the [facility] hematology/oncology in August for persistently elevated white cell count and platelet counts.   As reported previously, [name] has lost about 25 pounds over the past several months with a decline in his health overall. In June of this year, he began having weakness and falls. He was admitted by his PCP (Dr. James Kim) to [facility] and scans including his abdomen and pelvis and MRI of his abdomen was negative (per the patient and his son). He was persistently iron deficient and mildly anemic prompting further evaluation by Dr. Tri Le in gastroenterology. Dr. Le performed and upper and lower GI endoscopy and there was no evidence of bleeding source and no malignancy. Non-malignant polyps were removed.   In July, there was noticeable elevation of his white cell count with a CBC on 07/21/2012 revealing WBC of 14.3, hemoglobin of 14.1, MV of 75.4 and a platelet count of 774. There was evidence of neutrophilia, lymphocytosis and monocytosis. Dr. Hurish obtained a JAK2 mutational analysis which was positive. Because his hemoglobin was not over 18 gram, Dr. Harish did not label his condition as polycythemia vera. Since he had abnormalities of all cell lines, he also did not label him as having essential  thrombocytosis either. He was recommended to start hydroxyurea and aspirin. The goal of hydrea was to decrease his platelet count to less than 600,000 and for the phlebotomies to decrease his hematocrit to 45. He was referred to us to be closer to his hometown of [place], [place]. He reports earlier this June, he had an episode of shingles. He also reports a sore right elbow and upper torso following his recent fall. He notes that he gets tired easily. He was seen by his cardiologist (Dr. Jonathan Berry) who performed Myoview stress testing and 2-D echocardiography all of which were normal. His symptoms have since resolved.    Today, he is doing well overall. He will Dr. Kim next month for a physical exam.  Dr. Berry in the next few months.  He denies any recent hospitalizations or emergency room visit.  He is now uses the gym at [facility].  He had some diarrhea this past Thursday but otherwise is feeling ok.    MEDICAL HISTORY: Past Medical History  Diagnosis Date  . Diabetes mellitus without complication   . Coronary artery disease   . Hypertension   . Hyperlipidemia   . Chest pain     INTERIM HISTORY: has DIABETES MELLITUS, TYPE II; HYPERLIPIDEMIA; HYPERTENSION; CORONARY ARTERY DISEASE; URI; GERD; LOW BACK PAIN; Dizziness of unknown cause; Acute renal failure; Leukocytopenia, unspecified; Thrombocytosis; AKI (acute kidney injury); Orthostatic hypotension; Gait instability; Protein-calorie malnutrition, severe; Type II or unspecified type diabetes mellitus with neurological manifestations, not stated as uncontrolled(250.60); Diabetic neuropathy; Cervicalgia; Myeloproliferative disorder; and Monoclonal gammopathy of unknown significance on his problem list.    ALLERGIES:    has No Known Allergies.  MEDICATIONS: has a current medication list which includes the following prescription(s): aspirin, duloxetine, gabapentin, glimepiride, hydroxyurea, l-methylfolate-b6-b12, losartan, metoprolol, fish oil,  omeprazole, and simvastatin.  SURGICAL HISTORY:  Past Surgical History  Procedure Laterality Date  . Eye surgery    . Cardiac surgery    . Doppler echocardiography  06/18/2010    EF =>55%,LV normal  . Nm myocar perf wall motion  05/23/2008    EF 62% ,norm myocardial perfusion  . Cardiac catheterization  11/01/2005    patent stents with normal LV function  . Coronary angioplasty      post LAD and RCA stenting  . Cardiac catheterization  11/08/2003    cypher stent mid dominant right coronary lesion  . Holter monitor  11/08/2005    REVIEW OF SYSTEMS:   Constitutional: Denies fevers, chills or abnormal weight loss Eyes: Denies blurriness of vision Ears, nose, mouth, throat, and face: Denies mucositis or sore throat Respiratory: Denies cough, dyspnea or wheezes Cardiovascular: Denies palpitation, chest discomfort or lower extremity swelling Gastrointestinal:  Denies nausea, heartburn or change in bowel habits Skin: Denies abnormal skin rashes Lymphatics: Denies new lymphadenopathy or easy bruising Neurological:Denies numbness, tingling or new weaknesses Behavioral/Psych: Mood is stable, no new changes  All other systems were reviewed with the patient and are negative.  PHYSICAL EXAMINATION: ECOG PERFORMANCE STATUS: 0 - Asymptomatic  BP 126/61  Pulse 60  Temp(Src) 97.5 F (36.4 C) (Oral)  Resp 18  Ht 6' (1.829 m)  Wt 214 lb 6.4 oz (97.251 kg)  BMI 29.07 kg/m2  SpO2 100%  GENERAL:alert, no distress and comfortable; elderly male who appears his stated age.  SKIN: skin color, texture, turgor are normal, no rashes or significant lesions EYES: normal, Conjunctiva are pink and non-injected, sclera clear OROPHARYNX:no exudate, no erythema and lips, buccal mucosa, and tongue normal  NECK: supple, thyroid normal size, non-tender, without nodularity LYMPH:  no palpable lymphadenopathy in the cervical, axillary or supraclavicular LUNGS: clear to auscultation and percussion with  normal breathing effort HEART: regular rate & rhythm and no murmurs and no lower extremity edema ABDOMEN:abdomen soft, non-tender and normal bowel sounds Musculoskeletal:no cyanosis of digits and no clubbing  NEURO: alert & oriented x 3 with fluent speech, no focal motor/sensory deficits  Labs:  Lab Results  Component Value Date   WBC 12.5* 04/23/2013   HGB 15.2 04/23/2013   HCT 47.8 04/23/2013   MCV 91.6 04/23/2013   PLT 346 04/23/2013   NEUTROABS 8.5* 04/23/2013      Chemistry      Component Value Date/Time   NA 138 04/23/2013 1025   NA 136 06/19/2012 0530   K 4.5 04/23/2013 1025   K 3.9 06/19/2012 0530   CL 102 06/19/2012 0530   CO2 24 04/23/2013 1025   CO2 25 06/19/2012 0530   BUN 19.6 04/23/2013 1025   BUN 20 06/19/2012 0530   CREATININE 1.3 04/23/2013 1025   CREATININE 1.25 06/19/2012 0530      Component Value Date/Time   CALCIUM 9.5 04/23/2013 1025   CALCIUM 9.2 06/19/2012 0530   ALKPHOS 82 04/23/2013 1025   ALKPHOS 46 06/17/2012 1318   AST 13 04/23/2013 1025   AST 19 06/17/2012 1318   ALT 13 04/23/2013 1025   ALT 16 06/17/2012 1318   BILITOT 0.48 04/23/2013 1025   BILITOT 0.2* 06/17/2012 1318     Results for Pazos, Ramses A (MRN [id number]) as of 11/13/2012 08:31  Ref. Range 09/02/2012 14:19  Albumin SerPl   Elph-Mcnc Latest Range: 3.2-5.6 g/dL 4.0  Albumin/Glob SerPl Latest Range: 0.7-2.0  1.4  Alpha2 Glob SerPl Elph-Mcnc Latest Range: 0.4-1.2 g/dL 0.7  B-Globulin SerPl Elph-Mcnc Latest Range: 0.6-1.3 g/dL 0.8  Gamma Glob SerPl Elph-Mcnc Latest Range: 0.5-1.6 g/dL 1.3  IgA/Immunoglobulin A, Serum Latest Range: 91-414 mg/dL 151  IgM (Immunoglobin M), Srm Latest Range: 40-230 mg/dL 612 (H)  M Protein SerPl Elph-Mcnc Latest Range: Not Observed g/dL 0.5 (H)  Total Protein Latest Range: 6.0-8.5 g/dL 7.0   Studies:  No results found.   RADIOGRAPHIC STUDIES: No results found.  ASSESSMENT: [name] is a 80 y.o. male with a history of newly diagnosed myeloproliferative  disorder and possible IgM kappa MGUS. He also has an elevated vitamin b12. JAK2 is positive. EPO pending. He is negative for classic aquagenic pruritus negative for erythromegalalia.   PLAN:   1. Myeloproliferative disorder. --He meets 1 major PCV study group criteria including an arterial oxygen greater than or equal to 92% and but does not meet the hemoglobin greater than 18.5; he meets the three minor PCV criteria including elevation of platelets greater than 400,000, white blood cells greater than 12,000, serum vitamin B12 greater than 900. Other criteria which includes a bone marrow biopsy and erythropoietin levels can also be used such as the revised WHO criteria. It requires elevated hbg as noted above plus a positive JAK2 (2 major) and one of the following: Findings on the bone marrow biopsy and a low EPO level. However, he does not have hemoglobin greater than 18.5. We have not checked his EPO level. He does not have splenomegaly on physical exam. In addition 95 to percent of people with polycythemia vera positive JAK2.   --Due to high suspicion as noted above, I think is , like the referring hematologist, to treat it as a JAK2 myeloproliferative disorder, I recommended to perform for phlebetomy with a goal less than 45% for hematocrit on the CYTO-PV trial ([place] R, NEJM 2013). One unit phlebetomy (500 mL) should drop the hematocrit by 3%. I also recommend to continue a baby aspirin (81 mg) daily. Based on his age greater than 56 placing him at a higher thrombotic, we will also continue his hydroxyurea daily as an adjunctive therapy.    --Today, his labs demonstrate a normal plt level with hct of 47.9.  We will perform phlebotomy today and schedule for repeat labs in one month.  We also advised him to continue his hydrea dose to 14 tabs weekly instead of 11 weekly.  We will schedule a phlebotomy in month if his hematocrit is greater than 45%.     He will have repeat labs monthly follow up with  me in three months.   2. IgM kappa MGUS. --In addition, his labs reveal an elevated IgM (612) plus kappa. Continue close monitoring.   3. Follow-up. ----The patient voices understanding of current disease status and treatment options and is in agreement with the current care plan. He should follow up with our clinic in three months and should have a CBC in monthly. He will repeat his CBC monthly to ascertain if a phlebectomy is warranted.  --All questions were answered. The patient knows to call the clinic with any problems, questions or concerns. We can certainly see the patient much sooner if necessary. Last visit, he was provided a handout on hydrea, and polycythemia vera (though he does not completely meet the diagnostic criteria). He also was provided an after visit summary.   I spent 15  minutes counseling the patient face to face. The total time spent in the appointment was 25 minutes.    David Chism, MD 04/23/2013 11:19 AM 

## 2013-04-23 NOTE — Patient Instructions (Signed)

## 2013-04-23 NOTE — Progress Notes (Signed)
Pt in for Md appointment and required a therapeutic phlebotomy.  500 grams obtained from right antecubital.  Pt without complaints after phlebotomy.  Pt offered refreshments after and accepted a ginger ale.  Post vitals obtained and pt escorted to door.  Pt ambulatory without complaints at discharge.  Pt instructed to increase fluid intake over the next couple of days.  Pt stated the he would drink more water over the next couple of days.

## 2013-04-23 NOTE — Telephone Encounter (Signed)
gv and printed appt sched and avs ro tpf ro May June and July

## 2013-04-27 LAB — SPEP & IFE WITH QIG
ALPHA-2-GLOBULIN: 11.2 % (ref 7.1–11.8)
Albumin ELP: 52.6 % — ABNORMAL LOW (ref 55.8–66.1)
Alpha-1-Globulin: 6.2 % — ABNORMAL HIGH (ref 2.9–4.9)
BETA GLOBULIN: 6.2 % (ref 4.7–7.2)
Beta 2: 3.5 % (ref 3.2–6.5)
Gamma Globulin: 20.3 % — ABNORMAL HIGH (ref 11.1–18.8)
IgA: 169 mg/dL (ref 68–379)
IgG (Immunoglobin G), Serum: 1130 mg/dL (ref 650–1600)
IgM, Serum: 458 mg/dL — ABNORMAL HIGH (ref 41–251)
M-SPIKE, %: 0.48 g/dL
Total Protein, Serum Electrophoresis: 6.9 g/dL (ref 6.0–8.3)

## 2013-04-27 LAB — KAPPA/LAMBDA LIGHT CHAINS
KAPPA LAMBDA RATIO: 1.79 — AB (ref 0.26–1.65)
Kappa free light chain: 3.88 mg/dL — ABNORMAL HIGH (ref 0.33–1.94)
Lambda Free Lght Chn: 2.17 mg/dL (ref 0.57–2.63)

## 2013-05-03 LAB — UIFE/LIGHT CHAINS/TP QN, 24-HR UR
Albumin, U: DETECTED
Alpha 1, Urine: DETECTED — AB
Alpha 2, Urine: DETECTED — AB
Beta, Urine: DETECTED — AB
Free Kappa Lt Chains,Ur: 5.97 mg/dL — ABNORMAL HIGH (ref 0.14–2.42)
Free Kappa/Lambda Ratio: 35.12 ratio — ABNORMAL HIGH (ref 2.04–10.37)
Free Lambda Excretion/Day: 4.25 mg/d
Free Lambda Lt Chains,Ur: 0.17 mg/dL (ref 0.02–0.67)
Free Lt Chn Excr Rate: 149.25 mg/d
Gamma Globulin, Urine: DETECTED — AB
Time: 24 hours
Total Protein, Urine-Ur/day: 165 mg/d — ABNORMAL HIGH (ref 10–140)
Total Protein, Urine: 6.6 mg/dL
Volume, Urine: 2500 mL

## 2013-05-21 ENCOUNTER — Ambulatory Visit: Payer: Medicare Other

## 2013-05-21 ENCOUNTER — Other Ambulatory Visit (HOSPITAL_BASED_OUTPATIENT_CLINIC_OR_DEPARTMENT_OTHER): Payer: Medicare Other

## 2013-05-21 DIAGNOSIS — D47Z9 Other specified neoplasms of uncertain behavior of lymphoid, hematopoietic and related tissue: Secondary | ICD-10-CM

## 2013-05-21 DIAGNOSIS — D471 Chronic myeloproliferative disease: Secondary | ICD-10-CM

## 2013-05-21 LAB — BASIC METABOLIC PANEL (CC13)
ANION GAP: 9 meq/L (ref 3–11)
BUN: 17.5 mg/dL (ref 7.0–26.0)
CALCIUM: 9.6 mg/dL (ref 8.4–10.4)
CO2: 25 mEq/L (ref 22–29)
Chloride: 104 mEq/L (ref 98–109)
Creatinine: 1.2 mg/dL (ref 0.7–1.3)
Glucose: 160 mg/dl — ABNORMAL HIGH (ref 70–140)
Potassium: 4.7 mEq/L (ref 3.5–5.1)
SODIUM: 137 meq/L (ref 136–145)

## 2013-05-21 LAB — CBC WITH DIFFERENTIAL/PLATELET
BASO%: 1.7 % (ref 0.0–2.0)
Basophils Absolute: 0.1 10*3/uL (ref 0.0–0.1)
EOS%: 2.4 % (ref 0.0–7.0)
Eosinophils Absolute: 0.2 10*3/uL (ref 0.0–0.5)
HCT: 41 % (ref 38.4–49.9)
HGB: 12.9 g/dL — ABNORMAL LOW (ref 13.0–17.1)
LYMPH%: 23.9 % (ref 14.0–49.0)
MCH: 29.3 pg (ref 27.2–33.4)
MCHC: 31.5 g/dL — ABNORMAL LOW (ref 32.0–36.0)
MCV: 93.2 fL (ref 79.3–98.0)
MONO#: 1 10*3/uL — ABNORMAL HIGH (ref 0.1–0.9)
MONO%: 13.5 % (ref 0.0–14.0)
NEUT%: 58.5 % (ref 39.0–75.0)
NEUTROS ABS: 4.1 10*3/uL (ref 1.5–6.5)
Platelets: 459 10*3/uL — ABNORMAL HIGH (ref 140–400)
RBC: 4.4 10*6/uL (ref 4.20–5.82)
RDW: 15 % — ABNORMAL HIGH (ref 11.0–14.6)
WBC: 7 10*3/uL (ref 4.0–10.3)
lymph#: 1.7 10*3/uL (ref 0.9–3.3)

## 2013-05-21 NOTE — Progress Notes (Signed)
Patient Hct: 41.0. Per Hematocrit MD orders, phlebotomy not needed. Patient verbalized understanding.

## 2013-06-18 ENCOUNTER — Other Ambulatory Visit (HOSPITAL_BASED_OUTPATIENT_CLINIC_OR_DEPARTMENT_OTHER): Payer: Medicare Other

## 2013-06-18 ENCOUNTER — Ambulatory Visit: Payer: Medicare Other

## 2013-06-18 DIAGNOSIS — D47Z9 Other specified neoplasms of uncertain behavior of lymphoid, hematopoietic and related tissue: Secondary | ICD-10-CM

## 2013-06-18 DIAGNOSIS — D471 Chronic myeloproliferative disease: Secondary | ICD-10-CM

## 2013-06-18 LAB — CBC WITH DIFFERENTIAL/PLATELET
BASO%: 1.7 % (ref 0.0–2.0)
Basophils Absolute: 0.1 10*3/uL (ref 0.0–0.1)
EOS%: 1.7 % (ref 0.0–7.0)
Eosinophils Absolute: 0.1 10*3/uL (ref 0.0–0.5)
HCT: 44.2 % (ref 38.4–49.9)
HEMOGLOBIN: 14.1 g/dL (ref 13.0–17.1)
LYMPH#: 1.8 10*3/uL (ref 0.9–3.3)
LYMPH%: 22.2 % (ref 14.0–49.0)
MCH: 30.4 pg (ref 27.2–33.4)
MCHC: 31.9 g/dL — AB (ref 32.0–36.0)
MCV: 95.3 fL (ref 79.3–98.0)
MONO#: 0.9 10*3/uL (ref 0.1–0.9)
MONO%: 10.8 % (ref 0.0–14.0)
NEUT#: 5 10*3/uL (ref 1.5–6.5)
NEUT%: 63.6 % (ref 39.0–75.0)
Platelets: 264 10*3/uL (ref 140–400)
RBC: 4.63 10*6/uL (ref 4.20–5.82)
RDW: 18 % — AB (ref 11.0–14.6)
WBC: 7.9 10*3/uL (ref 4.0–10.3)

## 2013-06-18 NOTE — Progress Notes (Signed)
Hct level for phlebotomy is 45 or less;  Hct level today = 44.2, so phlebotomy not needed today; copy of lab results given to patient and d/ced home.

## 2013-06-23 ENCOUNTER — Other Ambulatory Visit: Payer: Self-pay | Admitting: Internal Medicine

## 2013-07-02 ENCOUNTER — Encounter: Payer: Self-pay | Admitting: Cardiovascular Disease

## 2013-07-19 ENCOUNTER — Telehealth: Payer: Self-pay | Admitting: Internal Medicine

## 2013-07-19 ENCOUNTER — Other Ambulatory Visit (HOSPITAL_BASED_OUTPATIENT_CLINIC_OR_DEPARTMENT_OTHER): Payer: Medicare Other

## 2013-07-19 ENCOUNTER — Ambulatory Visit (HOSPITAL_BASED_OUTPATIENT_CLINIC_OR_DEPARTMENT_OTHER): Payer: Medicare Other | Admitting: Internal Medicine

## 2013-07-19 ENCOUNTER — Encounter: Payer: Self-pay | Admitting: Internal Medicine

## 2013-07-19 VITALS — BP 132/66 | HR 53 | Temp 97.9°F | Resp 18 | Ht 72.0 in | Wt 222.3 lb

## 2013-07-19 DIAGNOSIS — D471 Chronic myeloproliferative disease: Secondary | ICD-10-CM

## 2013-07-19 DIAGNOSIS — D47Z9 Other specified neoplasms of uncertain behavior of lymphoid, hematopoietic and related tissue: Secondary | ICD-10-CM

## 2013-07-19 DIAGNOSIS — D472 Monoclonal gammopathy: Secondary | ICD-10-CM

## 2013-07-19 LAB — CBC WITH DIFFERENTIAL/PLATELET
BASO%: 1.9 % (ref 0.0–2.0)
BASOS ABS: 0.1 10*3/uL (ref 0.0–0.1)
EOS%: 2.1 % (ref 0.0–7.0)
Eosinophils Absolute: 0.1 10*3/uL (ref 0.0–0.5)
HEMATOCRIT: 43.6 % (ref 38.4–49.9)
HEMOGLOBIN: 14.1 g/dL (ref 13.0–17.1)
LYMPH%: 25.7 % (ref 14.0–49.0)
MCH: 31.2 pg (ref 27.2–33.4)
MCHC: 32.3 g/dL (ref 32.0–36.0)
MCV: 96.8 fL (ref 79.3–98.0)
MONO#: 0.7 10*3/uL (ref 0.1–0.9)
MONO%: 11.5 % (ref 0.0–14.0)
NEUT#: 3.5 10*3/uL (ref 1.5–6.5)
NEUT%: 58.8 % (ref 39.0–75.0)
Platelets: 259 10*3/uL (ref 140–400)
RBC: 4.5 10*6/uL (ref 4.20–5.82)
RDW: 17.4 % — ABNORMAL HIGH (ref 11.0–14.6)
WBC: 5.9 10*3/uL (ref 4.0–10.3)
lymph#: 1.5 10*3/uL (ref 0.9–3.3)

## 2013-07-19 LAB — COMPREHENSIVE METABOLIC PANEL (CC13)
ALT: 19 U/L (ref 0–55)
AST: 19 U/L (ref 5–34)
Albumin: 3.7 g/dL (ref 3.5–5.0)
Alkaline Phosphatase: 67 U/L (ref 40–150)
Anion Gap: 7 mEq/L (ref 3–11)
BUN: 18.4 mg/dL (ref 7.0–26.0)
CALCIUM: 9.2 mg/dL (ref 8.4–10.4)
CO2: 24 mEq/L (ref 22–29)
CREATININE: 1.2 mg/dL (ref 0.7–1.3)
Chloride: 104 mEq/L (ref 98–109)
Glucose: 159 mg/dl — ABNORMAL HIGH (ref 70–140)
Potassium: 4.7 mEq/L (ref 3.5–5.1)
Sodium: 135 mEq/L — ABNORMAL LOW (ref 136–145)
Total Bilirubin: 0.54 mg/dL (ref 0.20–1.20)
Total Protein: 7.5 g/dL (ref 6.4–8.3)

## 2013-07-19 NOTE — Progress Notes (Signed)
[name] OFFICE PROGRESS NOTE  [name] Grippe, MD [address] [place] [address]  DIAGNOSIS: Myeloproliferative disorder - Plan: CBC with Differential, Comprehensive metabolic panel (Cmet) - [facility], Lactate dehydrogenase (LDH) - [facility], Erythropoietin  Monoclonal gammopathy of unknown significance - Plan: IgG, IgA, IgM, Kappa/lambda light chains  Chief Complaint  Patient presents with  . Myeloproliferative disorder    CURRENT THERAPY: Hydrea 1,000 mg daily; Phlebetomy to maintain hemocrit less than 45%.   INTERVAL HISTORY: [name] 80 y.o. male with history of myeloproliferative disorder is here for follow-up.  He was last seen by me on 04/23/2013  He was referred by Dr. Vanessa [name] initially. He was seen by Dr. Allison [name] of the [name] hematology/oncology in August for persistently elevated white cell count and platelet counts.   As reported previously, [name] has lost about 25 pounds over the past several months with a decline in his health overall. In June of this year, he began having weakness and falls. He was admitted by his PCP (Dr. [name]) to [name] hospital and scans including his abdomen and pelvis and MRI of his abdomen was negative (per the patient and his son). He was persistently iron deficient and mildly anemic prompting further evaluation by [name] in gastroenterology. [name] performed and upper and lower GI endoscopy and there was no evidence of bleeding source and no malignancy. Non-malignant polyps were removed.   In July, there was noticeable elevation of his white cell count with a CBC on 07/21/2012 revealing WBC of 14.3, hemoglobin of 14.1, MV of 75.4 and a platelet count of 774. There was evidence of neutrophilia, lymphocytosis and monocytosis. [name] obtained a JAK2 mutational analysis which was positive. Because his hemoglobin was not over 18 gram, Dr. Allison [name] did not label his condition as  polycythemia vera. Since he had abnormalities of all cell lines, he also did not label him as having essential thrombocytosis either. He was recommended to start hydroxyurea and aspirin. The goal of hydrea was to decrease his platelet count to less than 600,000 and for the phlebotomies to decrease his hematocrit to 45. He was referred to [place] to be closer to his hometown of [place], [place]. He reports earlier this June, he had an episode of shingles. He also reports a sore right elbow and upper torso following his recent fall. He notes that he gets tired easily. He was seen by his cardiologist ([name]) who performed Myoview stress testing and 2-D echocardiography all of which were normal. His symptoms have since resolved.    Today, he is doing well overall. He will [name] in September.  He denies any recent hospitalizations or emergency room visit.  He is now uses the gym at [name].   MEDICAL HISTORY: Past Medical History  Diagnosis Date  . Diabetes mellitus without complication   . Coronary artery disease   . Hypertension   . Hyperlipidemia   . Chest pain     INTERIM HISTORY: has DIABETES MELLITUS, TYPE II; HYPERLIPIDEMIA; HYPERTENSION; CORONARY ARTERY DISEASE; URI; GERD; LOW BACK PAIN; Dizziness of unknown cause; Acute renal failure; Leukocytopenia, unspecified; Thrombocytosis; AKI (acute kidney injury); Orthostatic hypotension; Gait instability; Protein-calorie malnutrition, severe; Type II or unspecified type diabetes mellitus with neurological manifestations, not stated as uncontrolled(250.60); Diabetic neuropathy; Cervicalgia; Myeloproliferative disorder; and Monoclonal gammopathy of unknown significance on his problem list.    ALLERGIES:  has No Known Allergies.  MEDICATIONS: has a current medication list which  includes the following prescription(s): aspirin, duloxetine, gabapentin, glimepiride, hydroxyurea, l-methylfolate-b6-b12, losartan, metoprolol, fish oil, omeprazole,  and simvastatin.  SURGICAL HISTORY:  Past Surgical History  Procedure Laterality Date  . Eye surgery    . Cardiac surgery    . Doppler echocardiography  06/18/2010    EF =>55%,LV normal  . Nm myocar perf wall motion  05/23/2008    EF 62% ,norm myocardial perfusion  . Cardiac catheterization  11/01/2005    patent stents with normal LV function  . Coronary angioplasty      post LAD and RCA stenting  . Cardiac catheterization  11/08/2003    cypher stent mid dominant right coronary lesion  . Holter monitor  11/08/2005    REVIEW OF SYSTEMS:   Constitutional: Denies fevers, chills or abnormal weight loss Eyes: Denies blurriness of vision Ears, nose, mouth, throat, and face: Denies mucositis or sore throat Respiratory: Denies cough, dyspnea or wheezes Cardiovascular: Denies palpitation, chest discomfort or lower extremity swelling Gastrointestinal:  Denies nausea, heartburn or change in bowel habits Skin: Denies abnormal skin rashes Lymphatics: Denies new lymphadenopathy or easy bruising Neurological:Denies numbness, tingling or new weaknesses Behavioral/Psych: Mood is stable, no new changes  All other systems were reviewed with the patient and are negative.  PHYSICAL EXAMINATION: ECOG PERFORMANCE STATUS: 0 - Asymptomatic  BP 132/66  Pulse 53  Temp(Src) 97.9 F (36.6 C) (Oral)  Resp 18  Ht 6' (1.829 m)  Wt 222 lb 4.8 oz (100.835 kg)  BMI 30.14 kg/m2  SpO2 100%  GENERAL:alert, no distress and comfortable; elderly male who appears his stated age.  SKIN: skin color, texture, turgor are normal, no rashes or significant lesions EYES: normal, Conjunctiva are pink and non-injected, sclera clear OROPHARYNX:no exudate, no erythema and lips, buccal mucosa, and tongue normal  NECK: supple, thyroid normal size, non-tender, without nodularity LYMPH:  no palpable lymphadenopathy in the cervical, axillary or supraclavicular LUNGS: clear to auscultation and percussion with normal  breathing effort HEART: regular rate & rhythm and no murmurs and no lower extremity edema ABDOMEN:abdomen soft, non-tender and normal bowel sounds Musculoskeletal:no cyanosis of digits and no clubbing  NEURO: alert & oriented x 3 with fluent speech, no focal motor/sensory deficits  Labs:  Lab Results  Component Value Date   WBC 5.9 07/19/2013   HGB 14.1 07/19/2013   HCT 43.6 07/19/2013   MCV 96.8 07/19/2013   PLT 259 07/19/2013   NEUTROABS 3.5 07/19/2013      Chemistry      Component Value Date/Time   NA 135* 07/19/2013 1031   NA 136 06/19/2012 0530   K 4.7 07/19/2013 1031   K 3.9 06/19/2012 0530   CL 102 06/19/2012 0530   CO2 24 07/19/2013 1031   CO2 25 06/19/2012 0530   BUN 18.4 07/19/2013 1031   BUN 20 06/19/2012 0530   CREATININE 1.2 07/19/2013 1031   CREATININE 1.25 06/19/2012 0530      Component Value Date/Time   CALCIUM 9.2 07/19/2013 1031   CALCIUM 9.2 06/19/2012 0530   ALKPHOS 67 07/19/2013 1031   ALKPHOS 46 06/17/2012 1318   AST 19 07/19/2013 1031   AST 19 06/17/2012 1318   ALT 19 07/19/2013 1031   ALT 16 06/17/2012 1318   BILITOT 0.54 07/19/2013 1031   BILITOT 0.2* 06/17/2012 1318      Studies:  No results found.   RADIOGRAPHIC STUDIES: No results found.  ASSESSMENT: [name] is a 80 y.o. male with a history of newly diagnosed myeloproliferative disorder  and  IgM kappa MGUS. He also has an elevated vitamin b12. JAK2 is positive. EPO pending. He is negative for classic aquagenic pruritus negative for erythromegalalia.   PLAN:   1. Myeloproliferative disorder. --He meets 1 major PCV study group criteria including an arterial oxygen greater than or equal to 92% and but does not meet the hemoglobin greater than 18.5; he meets the three minor PCV criteria including elevation of platelets greater than 400,000, white blood cells greater than 12,000, serum vitamin B12 greater than 900. Other criteria which includes a bone marrow biopsy and erythropoietin levels can also be  used such as the revised WHO criteria. It requires elevated hbg as noted above plus a positive JAK2 (2 major) and one of the following: Findings on the bone marrow biopsy and a low EPO level. However, he does not have hemoglobin greater than 18.5. We have not checked his EPO level. He does not have splenomegaly on physical exam. In addition 95 to percent of people with polycythemia vera positive JAK2.   --Due to high suspicion as noted above, I think is , like the referring hematologist, to treat it as a JAK2 myeloproliferative disorder, I recommended to perform for phlebetomy with a goal less than 45% for hematocrit on the CYTO-PV trial ([place] R, NEJM 2013). One unit phlebetomy (500 mL) should drop the hematocrit by 3%. I also recommend to continue a baby aspirin (81 mg) daily. Based on his age greater than 90 placing him at a higher thrombotic, we will also continue his hydroxyurea daily as an adjunctive therapy.    --Today, his labs demonstrate a normal plt level with hct of 43.6.  We also advised him to continue his hydrea dose to 14 tabs weekly.  We will schedule a phlebotomy in 6 weeks if his hematocrit is greater than 45%.     He will have repeat labs at his PCP's office in 6 weeks and follow up with me in three months.   2. IgM kappa MGUS. --In addition, his labs reveal an elevated IgM (612) plus kappa. Continue close monitoring every 6-9 monhts. We will check quantitative immunoglobulins and kappa:lambda free light chains.   3. Follow-up. ----The patient voices understanding of current disease status and treatment options and is in agreement with the current care plan. He should follow up with our clinic in three months and should have a CBC in 6 weeks with his PCP's office  to ascertain if a phlebectomy is warranted.  --All questions were answered. The patient knows to call the clinic with any problems, questions or concerns. We can certainly see the patient much sooner if necessary.   I  spent 15 minutes counseling the patient face to face. The total time spent in the appointment was 25 minutes.    Esabella Stockinger, MD 07/19/2013 11:58 AM

## 2013-07-19 NOTE — Telephone Encounter (Signed)
gv and printed appt sched and avs for pt for OCT. °

## 2013-07-31 ENCOUNTER — Other Ambulatory Visit: Payer: Self-pay | Admitting: Internal Medicine

## 2013-08-02 ENCOUNTER — Telehealth: Payer: Self-pay | Admitting: Cardiovascular Disease

## 2013-08-02 NOTE — Telephone Encounter (Signed)
Closed encounter °

## 2013-10-11 ENCOUNTER — Other Ambulatory Visit (HOSPITAL_BASED_OUTPATIENT_CLINIC_OR_DEPARTMENT_OTHER): Payer: Medicare Other

## 2013-10-11 ENCOUNTER — Encounter: Payer: Self-pay | Admitting: Hematology

## 2013-10-11 ENCOUNTER — Ambulatory Visit (HOSPITAL_BASED_OUTPATIENT_CLINIC_OR_DEPARTMENT_OTHER): Payer: Medicare Other | Admitting: Hematology

## 2013-10-11 ENCOUNTER — Telehealth: Payer: Self-pay | Admitting: Hematology

## 2013-10-11 VITALS — BP 136/67 | HR 54 | Temp 97.8°F | Resp 18 | Ht 72.0 in | Wt 224.5 lb

## 2013-10-11 DIAGNOSIS — D471 Chronic myeloproliferative disease: Secondary | ICD-10-CM

## 2013-10-11 DIAGNOSIS — D472 Monoclonal gammopathy: Secondary | ICD-10-CM

## 2013-10-11 DIAGNOSIS — D47Z9 Other specified neoplasms of uncertain behavior of lymphoid, hematopoietic and related tissue: Secondary | ICD-10-CM

## 2013-10-11 LAB — CBC WITH DIFFERENTIAL/PLATELET
BASO%: 2.3 % — ABNORMAL HIGH (ref 0.0–2.0)
BASOS ABS: 0.1 10*3/uL (ref 0.0–0.1)
EOS ABS: 0.1 10*3/uL (ref 0.0–0.5)
EOS%: 1.3 % (ref 0.0–7.0)
HCT: 42.3 % (ref 38.4–49.9)
HGB: 13.7 g/dL (ref 13.0–17.1)
LYMPH%: 21.3 % (ref 14.0–49.0)
MCH: 33.5 pg — ABNORMAL HIGH (ref 27.2–33.4)
MCHC: 32.4 g/dL (ref 32.0–36.0)
MCV: 103.4 fL — ABNORMAL HIGH (ref 79.3–98.0)
MONO#: 0.6 10*3/uL (ref 0.1–0.9)
MONO%: 10.3 % (ref 0.0–14.0)
NEUT%: 64.8 % (ref 39.0–75.0)
NEUTROS ABS: 3.5 10*3/uL (ref 1.5–6.5)
PLATELETS: 243 10*3/uL (ref 140–400)
RBC: 4.09 10*6/uL — ABNORMAL LOW (ref 4.20–5.82)
RDW: 16.2 % — AB (ref 11.0–14.6)
WBC: 5.5 10*3/uL (ref 4.0–10.3)
lymph#: 1.2 10*3/uL (ref 0.9–3.3)

## 2013-10-11 LAB — COMPREHENSIVE METABOLIC PANEL (CC13)
ALT: 14 U/L (ref 0–55)
ANION GAP: 9 meq/L (ref 3–11)
AST: 13 U/L (ref 5–34)
Albumin: 3.5 g/dL (ref 3.5–5.0)
Alkaline Phosphatase: 69 U/L (ref 40–150)
BUN: 21.1 mg/dL (ref 7.0–26.0)
CO2: 22 meq/L (ref 22–29)
Calcium: 9.2 mg/dL (ref 8.4–10.4)
Chloride: 104 mEq/L (ref 98–109)
Creatinine: 1.1 mg/dL (ref 0.7–1.3)
GLUCOSE: 182 mg/dL — AB (ref 70–140)
POTASSIUM: 4.3 meq/L (ref 3.5–5.1)
SODIUM: 135 meq/L — AB (ref 136–145)
TOTAL PROTEIN: 7.4 g/dL (ref 6.4–8.3)
Total Bilirubin: 0.4 mg/dL (ref 0.20–1.20)

## 2013-10-11 LAB — LACTATE DEHYDROGENASE (CC13): LDH: 154 U/L (ref 125–245)

## 2013-10-11 NOTE — Progress Notes (Signed)
[name] HEMATOLOGY OFFICE PROGRESS NOTE 10/11/2013  [name] Gravel, MD [place] [address]  DIAGNOSIS: MGUS (monoclonal gammopathy of unknown significance) - Plan: CBC with Differential, Comprehensive metabolic panel (Cmet) - [facility], SPEP & IFE with QIG, Kappa/lambda light chains  Chief Complaint  Patient presents with  . Follow-up    CURRENT THERAPY: Hydrea 1,000 mg daily; Phlebetomy to maintain hemocrit less than 45%. Aspirin 325 mg daily.  INTERVAL HISTORY:  [name] 80 y.o. male with history of myeloproliferative disorder is here for follow-up.  He was last seen by Dr Juliann Mule on 07/09/13. He was referred by Dr. Leeanne Rio initially. He was seen by Dr. Cruzita Lederer of the [facility] hematology/oncology in August for persistently elevated white cell count and platelet counts.   As reported previously, [name] has lost about 25 pounds over the past several months with a decline in his health overall. In June of this year, he began having weakness and falls. He was admitted by his PCP (Dr. Jani [name]) to [name] hospital and scans including his abdomen and pelvis and MRI of his abdomen was negative (per the patient and his son). He was persistently iron deficient and mildly anemic prompting further evaluation by Dr. Mitchell Heir in gastroenterology. Dr. Marin Comment performed and upper and lower GI endoscopy and there was no evidence of bleeding source and no malignancy. Non-malignant polyps were removed.   In July, there was noticeable elevation of his white cell count with a CBC on 07/21/2012 revealing WBC of 14.3, hemoglobin of 14.1, MV of 75.4 and a platelet count of 774. There was evidence of neutrophilia, lymphocytosis and monocytosis. Dr. Flora Lipps obtained a JAK2 mutational analysis which was positive. Because his hemoglobin was not over 18 gram, Dr. Cruzita Lederer did not label his condition as polycythemia vera. Since he had abnormalities of all cell  lines, he also did not label him as having essential thrombocytosis either. He was recommended to start hydroxyurea and aspirin. The goal of hydrea was to decrease his platelet count to less than 600,000 and for the phlebotomies to decrease his hematocrit to 45. He was referred to [place] to be closer to his hometown of [place], [place]. He reports earlier this June, he had an episode of shingles. He also reports a sore right elbow and upper torso following his recent fall. He notes that he gets tired easily. He was seen by his cardiologist (Dr. Quay Burow) who performed Myoview stress testing and 2-D echocardiography all of which were normal. His symptoms have since resolved.    Today, he is doing well overall. He will Dr. Maudie Mercury in September.  He denies any recent hospitalizations or emergency room visit.  He is now uses the gym at [place]. He  Has used 3 pills for Erectile dysfunction and was thinking of penile implant. He used Viagra, Levitra and Cialis and Levitra works best but it is too expensive 400 dollars a month. He asked for any alternatives which are cheap. I said eat lot of Fish and Blue berries and it might work. He is seeing his Cardiologist later this month. He got his flu shot.  MEDICAL HISTORY: Past Medical History  Diagnosis Date  . Diabetes mellitus without complication   . Coronary artery disease   . Hypertension   . Hyperlipidemia   . Chest pain     INTERIM HISTORY: has DIABETES MELLITUS, TYPE II; HYPERLIPIDEMIA; HYPERTENSION; CORONARY ARTERY DISEASE; URI; GERD; LOW BACK PAIN; Dizziness of unknown  cause; Acute renal failure; Leukocytopenia, unspecified; Thrombocytosis; AKI (acute kidney injury); Orthostatic hypotension; Gait instability; Protein-calorie malnutrition, severe; Type II or unspecified type diabetes mellitus with neurological manifestations, not stated as uncontrolled(250.60); Diabetic neuropathy; Cervicalgia; Myeloproliferative disorder; and Monoclonal gammopathy of  unknown significance on his problem list.    ALLERGIES:  has No Known Allergies.  MEDICATIONS: has a current medication list which includes the following prescription(s): aspirin, duloxetine, gabapentin, glimepiride, hydroxyurea, l-methylfolate-b6-b12, losartan, metoprolol, fish oil, omeprazole, and simvastatin.  SURGICAL HISTORY:  Past Surgical History  Procedure Laterality Date  . Eye surgery    . Cardiac surgery    . Doppler echocardiography  06/18/2010    EF =>55%,LV normal  . Nm myocar perf wall motion  05/23/2008    EF 62% ,norm myocardial perfusion  . Cardiac catheterization  11/01/2005    patent stents with normal LV function  . Coronary angioplasty      post LAD and RCA stenting  . Cardiac catheterization  11/08/2003    cypher stent mid dominant right coronary lesion  . Holter monitor  11/08/2005    REVIEW OF SYSTEMS:   Constitutional: Denies fevers, chills or abnormal weight loss Eyes: Denies blurriness of vision Ears, nose, mouth, throat, and face: Denies mucositis or sore throat Respiratory: Denies cough, dyspnea or wheezes Cardiovascular: Denies palpitation, chest discomfort or lower extremity swelling Gastrointestinal:  Denies nausea, heartburn or change in bowel habits Skin: Denies abnormal skin rashes Lymphatics: Denies new lymphadenopathy or easy bruising Neurological:Denies numbness, tingling or new weaknesses Behavioral/Psych: Mood is stable, no new changes  All other systems were reviewed with the patient and are negative.  PHYSICAL EXAMINATION: ECOG PERFORMANCE STATUS: 0  BP 136/67  Pulse 54  Temp(Src) 97.8 F (36.6 C) (Oral)  Resp 18  Ht 6' (1.829 m)  Wt 224 lb 8 oz (101.833 kg)  BMI 30.44 kg/m2  SpO2 98%  GENERAL:alert, no distress and comfortable; elderly male who appears his stated age.  SKIN: skin color, texture, turgor are normal, no rashes or significant lesions EYES: normal, Conjunctiva are pink and non-injected, sclera  clear OROPHARYNX:no exudate, no erythema and lips, buccal mucosa, and tongue normal  NECK: supple, thyroid normal size, non-tender, without nodularity LYMPH:  no palpable lymphadenopathy in the cervical, axillary or supraclavicular LUNGS: clear to auscultation and percussion with normal breathing effort HEART: regular rate & rhythm and no murmurs and no lower extremity edema ABDOMEN:abdomen soft, non-tender and normal bowel sounds Musculoskeletal:no cyanosis of digits and no clubbing  NEURO: alert & oriented x 3 with fluent speech, no focal motor/sensory deficits  Labs:  Lab Results  Component Value Date   WBC 5.5 10/11/2013   HGB 13.7 10/11/2013   HCT 42.3 10/11/2013   MCV 103.4* 10/11/2013   PLT 243 10/11/2013   NEUTROABS 3.5 10/11/2013      Chemistry      Component Value Date/Time   NA 135* 10/11/2013 0928   NA 136 06/19/2012 0530   K 4.3 10/11/2013 0928   K 3.9 06/19/2012 0530   CL 102 06/19/2012 0530   CO2 22 10/11/2013 0928   CO2 25 06/19/2012 0530   BUN 21.1 10/11/2013 0928   BUN 20 06/19/2012 0530   CREATININE 1.1 10/11/2013 0928   CREATININE 1.25 06/19/2012 0530      Component Value Date/Time   CALCIUM 9.2 10/11/2013 0928   CALCIUM 9.2 06/19/2012 0530   ALKPHOS 69 10/11/2013 0928   ALKPHOS 46 06/17/2012 1318   AST 13 10/11/2013 0928   AST  19 06/17/2012 1318   ALT 14 10/11/2013 0928   ALT 16 06/17/2012 1318   BILITOT 0.40 10/11/2013 0928   BILITOT 0.2* 06/17/2012 1318        RADIOGRAPHIC STUDIES: No results found.  ASSESSMENT: Kimmy A Latulippe is a 80 y.o. male with a history of newly diagnosed myeloproliferative disorder and  IgM kappa MGUS. He also has an elevated vitamin b12. JAK2 is positive. He is negative for classic aquagenic pruritus negative for erythromegalalia. He complained of ED and the cost of those medications today.   PLAN:   1. Myeloproliferative disorder. --He meets 1 major PCV study group criteria including an arterial oxygen greater than or equal to 92% and  but does not meet the hemoglobin greater than 18.5; he meets the three minor PCV criteria including elevation of platelets greater than 400,000, white blood cells greater than 12,000, serum vitamin B12 greater than 900. Other criteria which includes a bone marrow biopsy and erythropoietin levels can also be used such as the revised WHO criteria. It requires elevated hbg as noted above plus a positive JAK2 (2 major) and one of the following: Findings on the bone marrow biopsy and a low EPO level. However, he does not have hemoglobin greater than 18.5. We have not checked his EPO level. He does not have splenomegaly on physical exam. In addition 95 to percent of people with polycythemia vera positive JAK2.   --Due to high suspicion as noted above, I think is , like the referring hematologist, to treat it as a JAK2 myeloproliferative disorder, I recommended to perform for phlebetomy with a goal less than 45% for hematocrit on the CYTO-PV trial ([place] R, NEJM 2013). One unit phlebetomy (500 mL) should drop the hematocrit by 3%. I also recommend to continue a baby aspirin (81 mg) daily. Based on his age greater than 66 placing him at a higher thrombotic, we will also continue his hydroxyurea daily as an adjunctive therapy.    --Today, his labs demonstrate a normal plt level 243 with hct of 42.3.  We also advised him to continue his hydrea dose to 14 tabs weekly.  We will schedule a phlebotomy in 6 weeks if his hematocrit is greater than 45%.     He will have repeat labs at his PCP's office in 12 weeks and follow up with me in 3 months.   2. IgM kappa MGUS. --In addition, his labs reveal an elevated IgM (612) plus kappa. Continue close monitoring every 6-9 monhts. We will check quantitative immunoglobulins and kappa:lambda free light chains in 3 months.  3. Follow-up. ----The patient voices understanding of current disease status and treatment options and is in agreement with the current care plan. He should  follow up with our clinic in three months and should have a CBC in 6 weeks with his PCP's office  to ascertain if a phlebectomy is warranted.  --All questions were answered. The patient knows to call the clinic with any problems, questions or concerns. We can certainly see the patient much sooner if necessary.   I spent 15 minutes counseling the patient face to face. The total time spent in the appointment was 25 minutes.    Bernadene Bell, MD Medical Hematologist/Oncologist [place] Pager: [phone] Office No: [phone]

## 2013-10-11 NOTE — Telephone Encounter (Signed)
GV AND PRINTED APPT SCHED AND AVS FOR PT FOR jAN 2016  °

## 2013-10-13 LAB — IGG, IGA, IGM
IGG (IMMUNOGLOBIN G), SERUM: 1350 mg/dL (ref 650–1600)
IgA: 173 mg/dL (ref 68–379)
IgM, Serum: 528 mg/dL — ABNORMAL HIGH (ref 41–251)

## 2013-10-13 LAB — KAPPA/LAMBDA LIGHT CHAINS
Kappa free light chain: 4.53 mg/dL — ABNORMAL HIGH (ref 0.33–1.94)
Kappa:Lambda Ratio: 2.62 — ABNORMAL HIGH (ref 0.26–1.65)
Lambda Free Lght Chn: 1.73 mg/dL (ref 0.57–2.63)

## 2013-10-13 LAB — ERYTHROPOIETIN: ERYTHROPOIETIN: 6.4 m[IU]/mL (ref 2.6–18.5)

## 2013-10-20 ENCOUNTER — Other Ambulatory Visit: Payer: Self-pay | Admitting: Internal Medicine

## 2013-10-26 ENCOUNTER — Ambulatory Visit: Payer: Medicare Other | Admitting: Cardiovascular Disease

## 2013-10-27 ENCOUNTER — Other Ambulatory Visit: Payer: Self-pay | Admitting: *Deleted

## 2013-10-27 DIAGNOSIS — D72819 Decreased white blood cell count, unspecified: Secondary | ICD-10-CM

## 2013-10-27 DIAGNOSIS — D471 Chronic myeloproliferative disease: Secondary | ICD-10-CM

## 2013-10-27 MED ORDER — HYDROXYUREA 500 MG PO CAPS: ORAL_CAPSULE | ORAL | Status: DC

## 2013-11-26 ENCOUNTER — Encounter: Payer: Self-pay | Admitting: Cardiovascular Disease

## 2013-11-26 ENCOUNTER — Ambulatory Visit (INDEPENDENT_AMBULATORY_CARE_PROVIDER_SITE_OTHER): Payer: Medicare Other | Admitting: Cardiovascular Disease

## 2013-11-26 VITALS — BP 104/66 | HR 52 | Ht 72.0 in | Wt 220.2 lb

## 2013-11-26 DIAGNOSIS — I10 Essential (primary) hypertension: Secondary | ICD-10-CM

## 2013-11-26 DIAGNOSIS — I2510 Atherosclerotic heart disease of native coronary artery without angina pectoris: Secondary | ICD-10-CM

## 2013-11-26 NOTE — Patient Instructions (Signed)
Your physician wants you to follow-up in: 1 year with Dr Berry. You will receive a reminder letter in the mail two months in advance. If you don't receive a letter, please call our office to schedule the follow-up appointment.  

## 2013-11-26 NOTE — Assessment & Plan Note (Signed)
History of hypertension with blood pressure measured today at 104/66. He is on losartan and metoprolol. Continue current medications her current dosing

## 2013-11-26 NOTE — Assessment & Plan Note (Signed)
History of hyperlipidemia on simvastatin 40 mg a day followed by his primary care physician. Maintain current medications

## 2013-11-26 NOTE — Assessment & Plan Note (Signed)
History of CAD status post RCA and LAD stenting in the past. He is less catheterization was by Dr. Melvern Banker in 2007 revealing patent stents with normal LV function. He gets occasional chest pain. His last Myoview performed 10/16/12 was nonischemic.

## 2013-11-26 NOTE — Progress Notes (Signed)
11/26/2013 [name]   11/13/1932  [id number]  Primary Physician [name], MD Primary Cardiologist: Lorretta Harp MD Renae Gloss   HPI:  The patient is a very pleasant 80 year old mildly overweight widowed Caucasian male (wife died 08-24-10 at age 70), father of 25, grandfather to 6 grandchildren who I last saw 12 months ago. He has a history of CAD status post LAD and RCA stenting in the past. He was last catheterized by Dr. Melvern Banker in 2007 and found to have patent stents with normal LV function. His other problems include hypertension, hyperlipidemia and diabetes. He does not smoke or drink. His most recent lipid profile performed by Dr. Maudie Mercury in June of last year revealed a total cholesterol of 128, LDL of 67, and HDL of 35 Since I saw him 10/30/12 he has been relatively asymptomatic. He had a Myoview stress test and 2-D echo performed in October of last year which were normal. He has also gotten engaged since I last saw him. He complains of erectile dysfunction.  Current Outpatient Prescriptions  Medication Sig Dispense Refill  . aspirin 325 MG tablet Take 325 mg by mouth daily.    Marland Kitchen CINNAMON PO Take 1 capsule by mouth 2 (two) times daily.    . DULoxetine (CYMBALTA) 30 MG capsule Take 30 mg by mouth 2 (two) times daily.    Marland Kitchen gabapentin (NEURONTIN) 100 MG capsule Take 100 mg by mouth 2 (two) times daily.    Marland Kitchen glimepiride (AMARYL) 4 MG tablet Take 4 mg by mouth daily before breakfast.    . hydroxyurea (HYDREA) 500 MG capsule TAKE ONE CAPSULE BY MOUTH TWICE A DAY MAY TAKE WITH FOOD TO MINIMIZE SIDE EFFECTS 60 capsule 2  . losartan (COZAAR) 100 MG tablet Take 100 mg by mouth daily.    . metoprolol (LOPRESSOR) 50 MG tablet TAKE 1 TABLET BY MOUTH TWICE A DAY 60 tablet 9  . Omega-3 Fatty Acids (FISH OIL) 1200 MG CAPS Take 1 capsule by mouth 2 (two) times daily.    Marland Kitchen omeprazole (PRILOSEC) 20 MG capsule Take 20 mg by mouth daily.    . simvastatin (ZOCOR) 40 MG tablet  Take 40 mg by mouth every evening.     No current facility-administered medications for this visit.    No Known Allergies  History   Social History  . Marital Status: Married    Spouse Name: N/A    Number of Children: 4  . Years of Education: 12   Occupational History  . Not on file.   Social History Main Topics  . Smoking status: Former Smoker    Types: Cigarettes  . Smokeless tobacco: Former Systems developer    Quit date: 10/31/1982  . Alcohol Use: No  . Drug Use: No  . Sexual Activity: No   Other Topics Concern  . Not on file   Social History Narrative     Review of Systems: General: negative for chills, fever, night sweats or weight changes.  Cardiovascular: negative for chest pain, dyspnea on exertion, edema, orthopnea, palpitations, paroxysmal nocturnal dyspnea or shortness of breath Dermatological: negative for rash Respiratory: negative for cough or wheezing Urologic: negative for hematuria Abdominal: negative for nausea, vomiting, diarrhea, bright red blood per rectum, melena, or hematemesis Neurologic: negative for visual changes, syncope, or dizziness All other systems reviewed and are otherwise negative except as noted above.    Blood pressure 104/66, pulse 52, height 6' (1.829 m), weight 220 lb 3.2 oz (99.882  kg).  General appearance: alert and no distress Neck: no adenopathy, no carotid bruit, no JVD, supple, symmetrical, trachea midline and thyroid not enlarged, symmetric, no tenderness/mass/nodules Lungs: clear to auscultation bilaterally Heart: regular rate and rhythm, S1, S2 normal, no murmur, click, rub or gallop Extremities: extremities normal, atraumatic, no cyanosis or edema  EKG sinus bradycardia 52 without ST or T-wave changes. I personally reviewed this EKG  ASSESSMENT AND PLAN:   Essential hypertension History of hypertension with blood pressure measured today at 104/66. He is on losartan and metoprolol. Continue current medications her current  dosing  Coronary atherosclerosis History of CAD status post RCA and LAD stenting in the past. He is less catheterization was by Dr. Melvern Banker in 2007 revealing patent stents with normal LV function. He gets occasional chest pain. His last Myoview performed 10/16/12 was nonischemic.  HYPERLIPIDEMIA History of hyperlipidemia on simvastatin 40 mg a day followed by his primary care physician. Maintain current medications      Lorretta Harp MD [facility], [facility] 11/26/2013 4:42 PM

## 2013-12-10 ENCOUNTER — Other Ambulatory Visit: Payer: Self-pay | Admitting: Cardiovascular Disease

## 2013-12-11 NOTE — Telephone Encounter (Signed)
Rx was sent to pharmacy electronically. 

## 2013-12-21 ENCOUNTER — Telehealth: Payer: Self-pay | Admitting: Internal Medicine

## 2013-12-21 NOTE — Telephone Encounter (Signed)
, °

## 2014-01-10 ENCOUNTER — Encounter: Payer: Self-pay | Admitting: Internal Medicine

## 2014-01-10 ENCOUNTER — Telehealth: Payer: Self-pay | Admitting: Internal Medicine

## 2014-01-10 ENCOUNTER — Other Ambulatory Visit: Payer: Medicare Other

## 2014-01-10 ENCOUNTER — Ambulatory Visit (HOSPITAL_BASED_OUTPATIENT_CLINIC_OR_DEPARTMENT_OTHER): Payer: Medicare Other | Admitting: Internal Medicine

## 2014-01-10 ENCOUNTER — Other Ambulatory Visit (HOSPITAL_BASED_OUTPATIENT_CLINIC_OR_DEPARTMENT_OTHER): Payer: Medicare Other

## 2014-01-10 ENCOUNTER — Ambulatory Visit: Payer: Medicare Other

## 2014-01-10 VITALS — BP 133/55 | HR 58 | Temp 97.9°F | Resp 19 | Ht 72.0 in | Wt 218.0 lb

## 2014-01-10 DIAGNOSIS — E1140 Type 2 diabetes mellitus with diabetic neuropathy, unspecified: Secondary | ICD-10-CM

## 2014-01-10 DIAGNOSIS — D472 Monoclonal gammopathy: Secondary | ICD-10-CM

## 2014-01-10 DIAGNOSIS — D45 Polycythemia vera: Secondary | ICD-10-CM

## 2014-01-10 DIAGNOSIS — D6481 Anemia due to antineoplastic chemotherapy: Secondary | ICD-10-CM

## 2014-01-10 DIAGNOSIS — C946 Myelodysplastic disease, not classified: Secondary | ICD-10-CM

## 2014-01-10 LAB — CBC WITH DIFFERENTIAL/PLATELET
BASO%: 1.7 % (ref 0.0–2.0)
BASOS ABS: 0.1 10*3/uL (ref 0.0–0.1)
EOS%: 1.5 % (ref 0.0–7.0)
Eosinophils Absolute: 0.1 10*3/uL (ref 0.0–0.5)
HEMATOCRIT: 37.5 % — AB (ref 38.4–49.9)
HEMOGLOBIN: 12.5 g/dL — AB (ref 13.0–17.1)
LYMPH%: 29 % (ref 14.0–49.0)
MCH: 37.5 pg — ABNORMAL HIGH (ref 27.2–33.4)
MCHC: 33.3 g/dL (ref 32.0–36.0)
MCV: 112.6 fL — ABNORMAL HIGH (ref 79.3–98.0)
MONO#: 0.6 10*3/uL (ref 0.1–0.9)
MONO%: 13.5 % (ref 0.0–14.0)
NEUT#: 2.5 10*3/uL (ref 1.5–6.5)
NEUT%: 54.3 % (ref 39.0–75.0)
Platelets: 251 10*3/uL (ref 140–400)
RBC: 3.33 10*6/uL — ABNORMAL LOW (ref 4.20–5.82)
RDW: 15.6 % — ABNORMAL HIGH (ref 11.0–14.6)
WBC: 4.7 10*3/uL (ref 4.0–10.3)
lymph#: 1.4 10*3/uL (ref 0.9–3.3)

## 2014-01-10 LAB — COMPREHENSIVE METABOLIC PANEL (CC13)
ALT: 8 U/L (ref 0–55)
ANION GAP: 9 meq/L (ref 3–11)
AST: 12 U/L (ref 5–34)
Albumin: 3.3 g/dL — ABNORMAL LOW (ref 3.5–5.0)
Alkaline Phosphatase: 80 U/L (ref 40–150)
BUN: 14.5 mg/dL (ref 7.0–26.0)
CALCIUM: 9 mg/dL (ref 8.4–10.4)
CHLORIDE: 102 meq/L (ref 98–109)
CO2: 26 mEq/L (ref 22–29)
CREATININE: 1.2 mg/dL (ref 0.7–1.3)
EGFR: 60 mL/min/{1.73_m2} — ABNORMAL LOW (ref >90)
Glucose: 121 mg/dl (ref 70–140)
POTASSIUM: 4.5 meq/L (ref 3.5–5.1)
SODIUM: 138 meq/L (ref 136–145)
Total Bilirubin: 0.37 mg/dL (ref 0.20–1.20)
Total Protein: 7.2 g/dL (ref 6.4–8.3)

## 2014-01-10 NOTE — Telephone Encounter (Signed)
gv and printed appt sched and avs for pt for Feb thru April °

## 2014-01-10 NOTE — Progress Notes (Signed)
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  PRIOR THERAPY: Phlebotomy on as-needed basis.   CURRENT THERAPY: Hydroxyurea 1000 mg by mouth daily. This will be changed today to 500 mg by mouth daily.  INTERVAL HISTORY: [name] 81 y.o. male returns to the clinic today for follow-up visit accompanied by his girlfriend. He is a former patient of Dr. Juliann Mule who came today to establish care with me. He is feeling fine today with no specific complaints except for fatigue. He has been on hydroxyurea for several months and there is decline in his hemoglobin and hematocrit recently. The patient denied having any significant dizzy spells. He denied having any weight loss or night sweats. He has peripheral neuropathy from history of diabetes mellitus and diabetic neuropathy. He has no chest pain, shortness breath, cough or hemoptysis. The patient has repeat CBC and comprehensive metabolic panel performed earlier today and he is here for evaluation and discussion of his lab results and recommendation regarding treatment of his condition.  MEDICAL HISTORY: Past Medical History  Diagnosis Date  . Diabetes mellitus without complication   . Coronary artery disease   . Hypertension   . Hyperlipidemia   . Chest pain     ALLERGIES:  has No Known Allergies.  MEDICATIONS:  Current Outpatient Prescriptions  Medication Sig Dispense Refill  . aspirin 325 MG tablet Take 325 mg by mouth daily.    Marland Kitchen CINNAMON PO Take 1 capsule by mouth 2 (two) times daily.    . DULoxetine (CYMBALTA) 30 MG capsule Take 30 mg by mouth 2 (two) times daily.    Marland Kitchen gabapentin (NEURONTIN) 100 MG capsule Take 100 mg by mouth 2 (two) times daily.    Marland Kitchen glimepiride (AMARYL) 4 MG tablet Take 4 mg by mouth  daily before breakfast.    . hydroxyurea (HYDREA) 500 MG capsule TAKE ONE CAPSULE BY MOUTH TWICE A DAY MAY TAKE WITH FOOD TO MINIMIZE SIDE EFFECTS 60 capsule 2  . losartan (COZAAR) 100 MG tablet Take 100 mg by mouth daily.    . metoprolol (LOPRESSOR) 50 MG tablet Take 1 tablet (50 mg total) by mouth 2 (two) times daily. 60 tablet 11  . Omega-3 Fatty Acids (FISH OIL) 1200 MG CAPS Take 1 capsule by mouth 2 (two) times daily.    Marland Kitchen omeprazole (PRILOSEC) 20 MG capsule Take 20 mg by mouth daily.    . simvastatin (ZOCOR) 40 MG tablet Take 40 mg by mouth every evening.     No current facility-administered medications for this visit.    SURGICAL HISTORY:  Past Surgical History  Procedure Laterality Date  . Eye surgery    . Cardiac surgery    . Doppler echocardiography  06/18/2010    EF =>55%,LV normal  . Nm myocar perf wall motion  05/23/2008    EF 62% ,norm myocardial perfusion  . Cardiac catheterization  11/01/2005    patent stents with normal LV function  . Coronary angioplasty      post LAD and RCA stenting  . Cardiac catheterization  11/08/2003    cypher stent mid dominant right coronary lesion  . Holter monitor  11/08/2005    REVIEW OF SYSTEMS:  Constitutional: positive for fatigue Eyes: negative Ears, nose, mouth, throat, and face: negative Respiratory: negative Cardiovascular: negative Gastrointestinal:  negative Genitourinary:negative Integument/breast: negative Hematologic/lymphatic: negative Musculoskeletal:negative Neurological: negative Behavioral/Psych: negative Endocrine: negative Allergic/Immunologic: negative   PHYSICAL EXAMINATION: General appearance: alert, cooperative, fatigued and no distress Head: Normocephalic, without obvious abnormality, atraumatic Neck: no adenopathy, no JVD, supple, symmetrical, trachea midline and thyroid not enlarged, symmetric, no tenderness/mass/nodules Lymph nodes: Cervical, supraclavicular, and axillary nodes normal. Resp: clear  to auscultation bilaterally Back: symmetric, no curvature. ROM normal. No CVA tenderness. Cardio: regular rate and rhythm, S1, S2 normal, no murmur, click, rub or gallop GI: soft, non-tender; bowel sounds normal; no masses,  no organomegaly Extremities: extremities normal, atraumatic, no cyanosis or edema Neurologic: Alert and oriented X 3, normal strength and tone. Normal symmetric reflexes. Normal coordination and gait  ECOG PERFORMANCE STATUS: 1 - Symptomatic but completely ambulatory  Blood pressure 133/55, pulse 58, temperature 97.9 F (36.6 C), temperature source Oral, resp. rate 19, height 6' (1.829 m), weight 218 lb (98.884 kg), SpO2 100 %.  LABORATORY DATA: Lab Results  Component Value Date   WBC 4.7 01/10/2014   HGB 12.5* 01/10/2014   HCT 37.5* 01/10/2014   MCV 112.6* 01/10/2014   PLT 251 01/10/2014      Chemistry      Component Value Date/Time   NA 138 01/10/2014 1512   NA 136 06/19/2012 0530   K 4.5 01/10/2014 1512   K 3.9 06/19/2012 0530   CL 102 06/19/2012 0530   CO2 26 01/10/2014 1512   CO2 25 06/19/2012 0530   BUN 14.5 01/10/2014 1512   BUN 20 06/19/2012 0530   CREATININE 1.2 01/10/2014 1512   CREATININE 1.25 06/19/2012 0530      Component Value Date/Time   CALCIUM 9.0 01/10/2014 1512   CALCIUM 9.2 06/19/2012 0530   ALKPHOS 80 01/10/2014 1512   ALKPHOS 46 06/17/2012 1318   AST 12 01/10/2014 1512   AST 19 06/17/2012 1318   ALT 8 01/10/2014 1512   ALT 16 06/17/2012 1318   BILITOT 0.37 01/10/2014 1512   BILITOT 0.2* 06/17/2012 1318       RADIOGRAPHIC STUDIES: No results found.  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 The patient is currently on hydroxyurea 1000 mg by mouth daily and tolerating it well except for the fatigue secondary to drug-induced anemia. I had a lengthy discussion with the patient about his condition. I  recommended for him to reduce the dose of hydroxyurea to 500 mg by mouth daily. I will continue to monitor his CBC closely with monthly blood work. I would see him back for follow-up visit in 3 months with repeat CBC, comprehensive metabolic panel and LDH. He was advised to call immediately if he has any concerning symptoms in the interval. The patient voices understanding of current disease status and treatment options and is in agreement with the current care plan.  All questions were answered. The patient knows to call the clinic with any problems, questions or concerns. We can certainly see the patient much sooner if necessary.  I spent 15 minutes counseling the patient face to face. The total time spent in the appointment was 25 minutes.  Disclaimer: This note was dictated with voice recognition software. Similar sounding words can inadvertently be transcribed and may not be corrected upon review.

## 2014-01-12 LAB — KAPPA/LAMBDA LIGHT CHAINS
Kappa free light chain: 3.64 mg/dL — ABNORMAL HIGH (ref 0.33–1.94)
Kappa:Lambda Ratio: 1.61 (ref 0.26–1.65)
Lambda Free Lght Chn: 2.26 mg/dL (ref 0.57–2.63)

## 2014-01-12 LAB — SPEP & IFE WITH QIG
ALPHA-2-GLOBULIN: 12.4 % — AB (ref 7.1–11.8)
Albumin ELP: 51.3 % — ABNORMAL LOW (ref 55.8–66.1)
Alpha-1-Globulin: 5.7 % — ABNORMAL HIGH (ref 2.9–4.9)
Beta 2: 4.7 % (ref 3.2–6.5)
Beta Globulin: 4.8 % (ref 4.7–7.2)
Gamma Globulin: 21.1 % — ABNORMAL HIGH (ref 11.1–18.8)
IGA: 196 mg/dL (ref 68–379)
IGG (IMMUNOGLOBIN G), SERUM: 1220 mg/dL (ref 650–1600)
IgM, Serum: 425 mg/dL — ABNORMAL HIGH (ref 41–251)
M-Spike, %: 0.5 g/dL
TOTAL PROTEIN, SERUM ELECTROPHOR: 7.2 g/dL (ref 6.0–8.3)

## 2014-01-14 ENCOUNTER — Telehealth: Payer: Self-pay | Admitting: Internal Medicine

## 2014-01-14 NOTE — Telephone Encounter (Signed)
pt called to r/s 4.4 appt...done....pt ok and aware

## 2014-01-22 ENCOUNTER — Other Ambulatory Visit: Payer: Self-pay | Admitting: Hematology

## 2014-01-28 ENCOUNTER — Other Ambulatory Visit: Payer: Self-pay

## 2014-01-28 DIAGNOSIS — D75839 Thrombocytosis, unspecified: Secondary | ICD-10-CM

## 2014-01-28 DIAGNOSIS — D473 Essential (hemorrhagic) thrombocythemia: Secondary | ICD-10-CM

## 2014-01-28 MED ORDER — HYDROXYUREA 500 MG PO CAPS: ORAL_CAPSULE | ORAL | Status: DC

## 2014-02-07 ENCOUNTER — Other Ambulatory Visit (HOSPITAL_BASED_OUTPATIENT_CLINIC_OR_DEPARTMENT_OTHER): Payer: Medicare Other

## 2014-02-07 DIAGNOSIS — D45 Polycythemia vera: Secondary | ICD-10-CM

## 2014-02-07 DIAGNOSIS — D6481 Anemia due to antineoplastic chemotherapy: Secondary | ICD-10-CM

## 2014-02-07 DIAGNOSIS — C946 Myelodysplastic disease, not classified: Secondary | ICD-10-CM

## 2014-02-07 LAB — CBC WITH DIFFERENTIAL/PLATELET
BASO%: 0.9 % (ref 0.0–2.0)
BASOS ABS: 0.1 10*3/uL (ref 0.0–0.1)
EOS ABS: 0.2 10*3/uL (ref 0.0–0.5)
EOS%: 2.8 % (ref 0.0–7.0)
HCT: 39.8 % (ref 38.4–49.9)
HGB: 12.9 g/dL — ABNORMAL LOW (ref 13.0–17.1)
LYMPH%: 22.4 % (ref 14.0–49.0)
MCH: 36.8 pg — AB (ref 27.2–33.4)
MCHC: 32.5 g/dL (ref 32.0–36.0)
MCV: 113.2 fL — ABNORMAL HIGH (ref 79.3–98.0)
MONO#: 0.8 10*3/uL (ref 0.1–0.9)
MONO%: 11.3 % (ref 0.0–14.0)
NEUT%: 62.6 % (ref 39.0–75.0)
NEUTROS ABS: 4.5 10*3/uL (ref 1.5–6.5)
Platelets: 299 10*3/uL (ref 140–400)
RBC: 3.52 10*6/uL — ABNORMAL LOW (ref 4.20–5.82)
RDW: 14.2 % (ref 11.0–14.6)
WBC: 7.3 10*3/uL (ref 4.0–10.3)
lymph#: 1.6 10*3/uL (ref 0.9–3.3)

## 2014-02-09 ENCOUNTER — Emergency Department (HOSPITAL_COMMUNITY): Payer: Worker's Compensation

## 2014-02-09 ENCOUNTER — Encounter (HOSPITAL_COMMUNITY): Payer: Self-pay

## 2014-02-09 ENCOUNTER — Emergency Department (HOSPITAL_COMMUNITY)
Admission: EM | Admit: 2014-02-09 | Discharge: 2014-02-09 | Disposition: A | Discharge status: Home / Self Care | Payer: Worker's Compensation | Attending: Emergency Medicine | Admitting: Emergency Medicine

## 2014-02-09 DIAGNOSIS — Y998 Other external cause status: Secondary | ICD-10-CM | POA: Diagnosis not present

## 2014-02-09 DIAGNOSIS — Y9289 Other specified places as the place of occurrence of the external cause: Secondary | ICD-10-CM | POA: Insufficient documentation

## 2014-02-09 DIAGNOSIS — Y9389 Activity, other specified: Secondary | ICD-10-CM | POA: Insufficient documentation

## 2014-02-09 DIAGNOSIS — I2510 Atherosclerotic heart disease of native coronary artery without angina pectoris: Secondary | ICD-10-CM | POA: Diagnosis not present

## 2014-02-09 DIAGNOSIS — Z7982 Long term (current) use of aspirin: Secondary | ICD-10-CM | POA: Insufficient documentation

## 2014-02-09 DIAGNOSIS — I10 Essential (primary) hypertension: Secondary | ICD-10-CM | POA: Insufficient documentation

## 2014-02-09 DIAGNOSIS — Z79899 Other long term (current) drug therapy: Secondary | ICD-10-CM | POA: Insufficient documentation

## 2014-02-09 DIAGNOSIS — Z9861 Coronary angioplasty status: Secondary | ICD-10-CM | POA: Diagnosis not present

## 2014-02-09 DIAGNOSIS — R51 Headache: Secondary | ICD-10-CM | POA: Diagnosis not present

## 2014-02-09 DIAGNOSIS — E785 Hyperlipidemia, unspecified: Secondary | ICD-10-CM | POA: Insufficient documentation

## 2014-02-09 DIAGNOSIS — W010XXA Fall on same level from slipping, tripping and stumbling without subsequent striking against object, initial encounter: Secondary | ICD-10-CM | POA: Insufficient documentation

## 2014-02-09 DIAGNOSIS — W19XXXA Unspecified fall, initial encounter: Secondary | ICD-10-CM

## 2014-02-09 DIAGNOSIS — S4992XA Unspecified injury of left shoulder and upper arm, initial encounter: Secondary | ICD-10-CM | POA: Diagnosis present

## 2014-02-09 DIAGNOSIS — Z87891 Personal history of nicotine dependence: Secondary | ICD-10-CM | POA: Insufficient documentation

## 2014-02-09 DIAGNOSIS — S42302A Unspecified fracture of shaft of humerus, left arm, initial encounter for closed fracture: Secondary | ICD-10-CM

## 2014-02-09 DIAGNOSIS — S42212A Unspecified displaced fracture of surgical neck of left humerus, initial encounter for closed fracture: Secondary | ICD-10-CM | POA: Insufficient documentation

## 2014-02-09 DIAGNOSIS — E119 Type 2 diabetes mellitus without complications: Secondary | ICD-10-CM | POA: Diagnosis not present

## 2014-02-09 DIAGNOSIS — S299XXA Unspecified injury of thorax, initial encounter: Secondary | ICD-10-CM | POA: Diagnosis not present

## 2014-02-09 MED ORDER — ACETAMINOPHEN 325 MG PO TABS
650.0000 mg | ORAL_TABLET | Freq: Once | ORAL | Status: AC
  Administered 2014-02-09: 650 mg via ORAL
  Filled 2014-02-09: qty 2

## 2014-02-09 MED ORDER — HYDROCODONE-ACETAMINOPHEN 5-325 MG PO TABS: 1.0000 | ORAL_TABLET | Freq: Four times a day (QID) | ORAL | Status: DC | PRN

## 2014-02-09 NOTE — ED Notes (Signed)
Per pt, fell today face down.  Braced face/head using arms.  No head trauma.  No LOC Left arm jammed.  Went to urgent care.  Sent here.  Arm in sling

## 2014-02-09 NOTE — ED Provider Notes (Signed)
CSN: [id number]     Arrival date & time 02/09/14  1715 History   First MD Initiated Contact with Patient 02/09/14 1828     Chief Complaint  Patient presents with  . Fall  . Shoulder Pain     (Consider location/radiation/quality/duration/timing/severity/associated sxs/prior Treatment) Patient is a 81 y.o. male presenting with shoulder pain.  Shoulder Pain Location:  Shoulder Time since incident: a few hours. Injury: yes   Mechanism of injury: fall   Fall:    Fall occurred: slipped on wet floor.   Height of fall:  Standing   Point of impact:  Face and head (left shoulder) Shoulder location:  L shoulder Chronicity:  New Relieved by: staying still.   Worsened by:  Movement Associated symptoms: no muscle weakness, no neck pain, no numbness, no swelling and no tingling     Past Medical History  Diagnosis Date  . Diabetes mellitus without complication   . Coronary artery disease   . Hypertension   . Hyperlipidemia   . Chest pain    Past Surgical History  Procedure Laterality Date  . Eye surgery    . Cardiac surgery    . Doppler echocardiography  06/18/2010    EF =>55%,LV normal  . Nm myocar perf wall motion  05/23/2008    EF 62% ,norm myocardial perfusion  . Cardiac catheterization  11/01/2005    patent stents with normal LV function  . Coronary angioplasty      post LAD and RCA stenting  . Cardiac catheterization  11/08/2003    cypher stent mid dominant right coronary lesion  . Holter monitor  11/08/2005   History reviewed. No pertinent family history. History  Substance Use Topics  . Smoking status: Former Smoker    Types: Cigarettes  . Smokeless tobacco: Former Systems developer    Quit date: 10/31/1982  . Alcohol Use: No    Review of Systems  Musculoskeletal: Negative for neck pain.  All other systems reviewed and are negative.     Allergies  Review of patient's allergies indicates no known allergies.  Home Medications   Prior to Admission medications    Medication Sig Start Date End Date Taking? Authorizing Provider  acetaminophen (TYLENOL) 325 MG tablet Take 650 mg by mouth every 6 (six) hours as needed for moderate pain.   Yes Historical Provider, MD  aspirin 325 MG tablet Take 325 mg by mouth every morning.    Yes Historical Provider, MD  CINNAMON PO Take 1 capsule by mouth 2 (two) times daily.   Yes Historical Provider, MD  gabapentin (NEURONTIN) 100 MG capsule Take 100 mg by mouth 2 (two) times daily.   Yes Historical Provider, MD  glimepiride (AMARYL) 4 MG tablet Take 4 mg by mouth daily before breakfast.   Yes Historical Provider, MD  hydroxyurea (HYDREA) 500 MG capsule TAKE ONE CAPSULE BY MOUTH DAILY OR AS DIRECTED.  MAY TAKE WITH FOOD TO MINIMIZE SIDE EFFECTS Patient taking differently: Take 500 mg by mouth daily.  01/28/14  Yes Curt Bears, MD  losartan (COZAAR) 100 MG tablet Take 100 mg by mouth daily.   Yes Historical Provider, MD  metoprolol (LOPRESSOR) 50 MG tablet Take 1 tablet (50 mg total) by mouth 2 (two) times daily. 12/11/13  Yes Lorretta Harp, MD  Multiple Vitamin (MULTIVITAMIN WITH MINERALS) TABS tablet Take 1 tablet by mouth every morning.   Yes Historical Provider, MD  multivitamin (METANX) 3-35-2 MG TABS tablet Take 1 tablet by mouth 2 (two) times daily.  Yes Historical Provider, MD  Omega-3 Fatty Acids (FISH OIL) 1200 MG CAPS Take 1 capsule by mouth 2 (two) times daily.   Yes Historical Provider, MD  omeprazole (PRILOSEC) 20 MG capsule Take 20 mg by mouth daily.   Yes Historical Provider, MD  simvastatin (ZOCOR) 40 MG tablet Take 40 mg by mouth every evening.   Yes Historical Provider, MD   BP 137/72 mmHg  Pulse 57  Temp(Src) 97.6 F (36.4 C) (Oral)  Resp 18  SpO2 100% Physical Exam  Constitutional: He is oriented to person, place, and time. He appears well-developed and well-nourished. No distress.  HENT:  Head: Normocephalic and atraumatic. Head is without raccoon's eyes and without Battle's sign.  Nose:  Nose normal.  Eyes: Conjunctivae and EOM are normal. Pupils are equal, round, and reactive to light. No scleral icterus.  Neck: No spinous process tenderness and no muscular tenderness present.  Cardiovascular: Normal rate, regular rhythm, normal heart sounds and intact distal pulses.   No murmur heard. Pulmonary/Chest: Effort normal and breath sounds normal. He has no rales. He exhibits tenderness (left lateral chest wall).  Abdominal: Soft. There is no tenderness. There is no rebound and no guarding.  Musculoskeletal: He exhibits no edema.       Left shoulder: He exhibits decreased range of motion and tenderness. He exhibits no deformity, normal pulse and normal strength.       Thoracic back: He exhibits no tenderness and no bony tenderness.       Lumbar back: He exhibits no tenderness and no bony tenderness.  No evidence of trauma to extremities, except as noted.  2+ distal pulses.    Neurological: He is alert and oriented to person, place, and time.  Skin: Skin is warm and dry. No rash noted.  Psychiatric: He has a normal mood and affect.  Nursing note and vitals reviewed.   ED Course  Procedures (including critical care time) Labs Review Labs Reviewed - No data to display  Imaging Review Dg Chest 1 View  02/09/2014   CLINICAL DATA:  Status post fall at work. Left mid axillary rib pain. Initial encounter.  EXAM: CHEST  1 VIEW  COMPARISON:  Chest radiograph performed 06/17/2012, and earlier today at 3:17 p.m.  FINDINGS: No displaced rib fractures are seen. A mildly displaced fracture of the left greater tuberosity of the humerus is again noted. The suspected left humeral neck fracture is not well characterized on this view.  The lungs are relatively well expanded. Minimal bibasilar atelectasis noted. No pleural effusion or pneumothorax is seen.  The cardiomediastinal silhouette is mildly enlarged.  IMPRESSION: 1. No displaced rib fracture seen. Mildly displaced fracture of the left greater  tuberosity of the humerus again noted. Suspected left humeral neck fracture is not well characterized on this view. 2. Minimal bibasilar atelectasis noted.  Mild cardiomegaly.   Electronically Signed   By: Garald Balding M.D.   On: 02/09/2014 20:48   Ct Head Wo Contrast  02/09/2014   CLINICAL DATA:  81 year old male with headache status post falling  EXAM: CT HEAD WITHOUT CONTRAST  TECHNIQUE: Contiguous axial images were obtained from the base of the skull through the vertex without intravenous contrast.  COMPARISON:  Prior head CT 06/17/2012  FINDINGS: Negative for acute intracranial hemorrhage, acute infarction, mass, mass effect, hydrocephalus or midline shift. Gray-white differentiation is preserved throughout. Stable global cortical atrophy and mild chronic microvascular ischemic white matter disease. No focal scalp hematoma or calvarial abnormality. Globes appear intact. Small  right maxillary mucous retention cysts versus polyps. Normal aeration of the mastoid air cells and the remainder the paranasal sinuses.  IMPRESSION: 1. No acute intracranial abnormality. 2. Stable atrophy and chronic microvascular ischemic white matter changes.   Electronically Signed   By: Jacqulynn Cadet M.D.   On: 02/09/2014 19:44   Dg Shoulder Left  02/09/2014   CLINICAL DATA:  Status post fall; left shoulder pain. Subsequent encounter.  EXAM: LEFT SHOULDER - 2+ VIEW  COMPARISON:  Left shoulder radiographs performed earlier today at 3:25 p.m., at [facility]  FINDINGS: There is a fracture through the left greater tuberosity, with mild displacement. On correlation with the prior axillary view, a fracture through the surgical neck of the humerus is also suspected. This is not well seen on remaining views. The left humeral head remains seated at the glenoid. The left acromioclavicular joint is grossly unremarkable.  The visualized portion of the left lung are clear. No significant soft tissue abnormalities are characterized.  IMPRESSION:  Fracture through the left greater tuberosity, with mild displacement. Upon correlation with the prior axillary view, a fracture through the surgical neck of the humerus is also suspected.   Electronically Signed   By: Garald Balding M.D.   On: 02/09/2014 20:44  All radiology studies independently viewed by me.      EKG Interpretation None      MDM   Final diagnoses:  Fall  Humerus fracture, left, closed, initial encounter    81 yo male who fell.  He complains of left shoulder pain and left sided rib pain.  Plain films show left humeral fracture, thought to extend through anatomic neck.  Will talk with Ortho.    Discussed with ortho (Dr. Veverly Fells) who recommended continued sling and outpatient treatment.  Other imaging was negative.  Houston Siren III, MD 02/09/14 [phone]

## 2014-02-09 NOTE — ED Notes (Signed)
Pt, being sent by [facility], c/o L humeral fracture after a trip and fall at work earlier.  Facility would like Pt to receive an Ortho consult.  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

## 2014-02-09 NOTE — ED Notes (Signed)
Report received from previous RN, Tim.

## 2014-02-09 NOTE — ED Notes (Signed)
Bed: [facility] Expected date:  Expected time:  Means of arrival:  Comments: Triage 2

## 2014-02-09 NOTE — ED Notes (Signed)
Note:  He brings CD x-ray from Urgent Care which I am able to open in my computer.

## 2014-02-09 NOTE — Progress Notes (Signed)
CSW met with patient at bedside. Fiance was present. Patient confirms that he fell today. 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 Patient says that he was walking up to a table to get a sandwich and fell on his face.  Patient states that he was using his cane to walk and it slipped from his, which caused him to lose his balance. Patient states that prior to coming into [facility] he has been able to complete his ADL's independently.   Fiance appears to be a good support.  Fiance/Shirley Fulk [phone] H                                [phone] Charlton Amor Omelia [place] [phone] ED CSW 02/09/2014 6:54 PM

## 2014-02-09 NOTE — ED Notes (Signed)
CMS intact all fingers bilat.

## 2014-03-14 ENCOUNTER — Other Ambulatory Visit (HOSPITAL_BASED_OUTPATIENT_CLINIC_OR_DEPARTMENT_OTHER): Payer: Medicare Other

## 2014-03-14 DIAGNOSIS — D45 Polycythemia vera: Secondary | ICD-10-CM

## 2014-03-14 LAB — CBC WITH DIFFERENTIAL/PLATELET
BASO%: 1.4 % (ref 0.0–2.0)
Basophils Absolute: 0.1 10*3/uL (ref 0.0–0.1)
EOS%: 6.7 % (ref 0.0–7.0)
Eosinophils Absolute: 0.6 10*3/uL — ABNORMAL HIGH (ref 0.0–0.5)
HCT: 40.8 % (ref 38.4–49.9)
HGB: 13.2 g/dL (ref 13.0–17.1)
LYMPH%: 23.1 % (ref 14.0–49.0)
MCH: 34 pg — ABNORMAL HIGH (ref 27.2–33.4)
MCHC: 32.4 g/dL (ref 32.0–36.0)
MCV: 105.2 fL — AB (ref 79.3–98.0)
MONO#: 1.2 10*3/uL — AB (ref 0.1–0.9)
MONO%: 14.1 % — AB (ref 0.0–14.0)
NEUT%: 54.7 % (ref 39.0–75.0)
NEUTROS ABS: 4.8 10*3/uL (ref 1.5–6.5)
Platelets: 431 10*3/uL — ABNORMAL HIGH (ref 140–400)
RBC: 3.88 10*6/uL — AB (ref 4.20–5.82)
RDW: 13.7 % (ref 11.0–14.6)
WBC: 8.7 10*3/uL (ref 4.0–10.3)
lymph#: 2 10*3/uL (ref 0.9–3.3)

## 2014-04-11 ENCOUNTER — Other Ambulatory Visit (HOSPITAL_BASED_OUTPATIENT_CLINIC_OR_DEPARTMENT_OTHER): Payer: Medicare Other

## 2014-04-11 ENCOUNTER — Encounter: Payer: Self-pay | Admitting: Internal Medicine

## 2014-04-11 ENCOUNTER — Ambulatory Visit (HOSPITAL_BASED_OUTPATIENT_CLINIC_OR_DEPARTMENT_OTHER): Payer: Medicare Other | Admitting: Internal Medicine

## 2014-04-11 ENCOUNTER — Telehealth: Payer: Self-pay | Admitting: Internal Medicine

## 2014-04-11 ENCOUNTER — Ambulatory Visit: Payer: Medicare Other | Admitting: Internal Medicine

## 2014-04-11 ENCOUNTER — Other Ambulatory Visit: Payer: Medicare Other

## 2014-04-11 VITALS — BP 124/49 | HR 57 | Temp 98.0°F | Resp 16 | Ht 72.0 in | Wt 211.8 lb

## 2014-04-11 DIAGNOSIS — D75839 Thrombocytosis, unspecified: Secondary | ICD-10-CM

## 2014-04-11 DIAGNOSIS — M25512 Pain in left shoulder: Secondary | ICD-10-CM

## 2014-04-11 DIAGNOSIS — D45 Polycythemia vera: Secondary | ICD-10-CM | POA: Diagnosis not present

## 2014-04-11 DIAGNOSIS — D471 Chronic myeloproliferative disease: Secondary | ICD-10-CM

## 2014-04-11 DIAGNOSIS — D47Z9 Other specified neoplasms of uncertain behavior of lymphoid, hematopoietic and related tissue: Secondary | ICD-10-CM

## 2014-04-11 DIAGNOSIS — D473 Essential (hemorrhagic) thrombocythemia: Secondary | ICD-10-CM

## 2014-04-11 LAB — COMPREHENSIVE METABOLIC PANEL (CC13)
ALBUMIN: 3.5 g/dL (ref 3.5–5.0)
ALT: 10 U/L (ref 0–55)
ANION GAP: 11 meq/L (ref 3–11)
AST: 13 U/L (ref 5–34)
Alkaline Phosphatase: 86 U/L (ref 40–150)
BUN: 14.7 mg/dL (ref 7.0–26.0)
CALCIUM: 9 mg/dL (ref 8.4–10.4)
CO2: 24 mEq/L (ref 22–29)
Chloride: 103 mEq/L (ref 98–109)
Creatinine: 1.2 mg/dL (ref 0.7–1.3)
EGFR: 58 mL/min/{1.73_m2} — ABNORMAL LOW (ref >90)
Glucose: 154 mg/dl — ABNORMAL HIGH (ref 70–140)
POTASSIUM: 4.7 meq/L (ref 3.5–5.1)
Sodium: 138 mEq/L (ref 136–145)
Total Bilirubin: 0.45 mg/dL (ref 0.20–1.20)
Total Protein: 7.4 g/dL (ref 6.4–8.3)

## 2014-04-11 LAB — CBC WITH DIFFERENTIAL/PLATELET
BASO%: 2 % (ref 0.0–2.0)
Basophils Absolute: 0.1 10*3/uL (ref 0.0–0.1)
EOS ABS: 0.2 10*3/uL (ref 0.0–0.5)
EOS%: 2.5 % (ref 0.0–7.0)
HCT: 43.6 % (ref 38.4–49.9)
HEMOGLOBIN: 13.8 g/dL (ref 13.0–17.1)
LYMPH%: 20.7 % (ref 14.0–49.0)
MCH: 30.7 pg (ref 27.2–33.4)
MCHC: 31.7 g/dL — AB (ref 32.0–36.0)
MCV: 96.9 fL (ref 79.3–98.0)
MONO#: 0.7 10*3/uL (ref 0.1–0.9)
MONO%: 10.3 % (ref 0.0–14.0)
NEUT#: 4.2 10*3/uL (ref 1.5–6.5)
NEUT%: 64.5 % (ref 39.0–75.0)
Platelets: 347 10*3/uL (ref 140–400)
RBC: 4.5 10*6/uL (ref 4.20–5.82)
RDW: 15 % — AB (ref 11.0–14.6)
WBC: 6.6 10*3/uL (ref 4.0–10.3)
lymph#: 1.4 10*3/uL (ref 0.9–3.3)

## 2014-04-11 LAB — LACTATE DEHYDROGENASE (CC13): LDH: 163 U/L (ref 125–245)

## 2014-04-11 NOTE — Progress Notes (Signed)
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  PRIOR THERAPY: Phlebotomy on as-needed basis.   CURRENT THERAPY: Hydroxyurea 500 mg by mouth daily.   INTERVAL HISTORY: [name] 81 y.o. male returns to the clinic today for follow-up visit accompanied by his girlfriend.  The patient is feeling fine today with no specific complaints except for left shoulder pain secondary to arthritis. He is tolerating his treatment with Hydrea fairly well with no significant adverse effects. He denied having any weight loss or night sweats. He has peripheral neuropathy from history of diabetes mellitus and diabetic neuropathy. He has no chest pain, shortness breath, cough or hemoptysis. The patient has repeat CBC and comprehensive metabolic panel performed earlier today and he is here for evaluation and discussion of his lab results and recommendation regarding treatment of his condition.  MEDICAL HISTORY: Past Medical History  Diagnosis Date  . Diabetes mellitus without complication   . Coronary artery disease   . Hypertension   . Hyperlipidemia   . Chest pain     ALLERGIES:  has No Known Allergies.  MEDICATIONS:  Current Outpatient Prescriptions  Medication Sig Dispense Refill  . CINNAMON PO Take 1 capsule by mouth 2 (two) times daily.    Marland Kitchen gabapentin (NEURONTIN) 100 MG capsule Take 100 mg by mouth 2 (two) times daily.    Marland Kitchen glimepiride (AMARYL) 4 MG tablet Take 4 mg by mouth daily before breakfast.    . hydroxyurea (HYDREA) 500 MG capsule TAKE ONE CAPSULE BY MOUTH DAILY OR AS DIRECTED.  MAY TAKE WITH FOOD TO MINIMIZE SIDE EFFECTS (Patient taking differently: Take 500 mg by mouth daily. ) 30 capsule 3  . losartan (COZAAR) 100 MG tablet Take 100 mg by mouth daily.    .  metoprolol (LOPRESSOR) 50 MG tablet Take 1 tablet (50 mg total) by mouth 2 (two) times daily. 60 tablet 11  . Multiple Vitamin (MULTIVITAMIN WITH MINERALS) TABS tablet Take 1 tablet by mouth every morning.    . multivitamin (METANX) 3-35-2 MG TABS tablet Take 1 tablet by mouth 2 (two) times daily.    . Omega-3 Fatty Acids (FISH OIL) 1200 MG CAPS Take 1 capsule by mouth 2 (two) times daily.    Marland Kitchen omeprazole (PRILOSEC) 20 MG capsule Take 20 mg by mouth daily.    . simvastatin (ZOCOR) 40 MG tablet Take 40 mg by mouth every evening.    Marland Kitchen acetaminophen (TYLENOL) 325 MG tablet Take 650 mg by mouth every 6 (six) hours as needed for moderate pain.    Marland Kitchen aspirin 325 MG tablet Take 325 mg by mouth every morning.     Marland Kitchen HYDROcodone-acetaminophen (NORCO/VICODIN) 5-325 MG per tablet Take 1-2 tablets by mouth every 6 (six) hours as needed for moderate pain or severe pain. (Patient not taking: Reported on 04/11/2014) 15 tablet 0   No current facility-administered medications for this visit.    SURGICAL HISTORY:  Past Surgical History  Procedure Laterality Date  . Eye surgery    . Cardiac surgery    . Doppler echocardiography  06/18/2010    EF =>55%,LV normal  . Nm myocar perf wall motion  05/23/2008    EF 62% ,norm myocardial perfusion  . Cardiac catheterization  11/01/2005    patent stents with normal LV function  .  Coronary angioplasty      post LAD and RCA stenting  . Cardiac catheterization  11/08/2003    cypher stent mid dominant right coronary lesion  . Holter monitor  11/08/2005    REVIEW OF SYSTEMS:  Constitutional: positive for fatigue Eyes: negative Ears, nose, mouth, throat, and face: negative Respiratory: negative Cardiovascular: negative Gastrointestinal: negative Genitourinary:negative Integument/breast: negative Hematologic/lymphatic: negative Musculoskeletal:negative Neurological: negative Behavioral/Psych: negative Endocrine: negative Allergic/Immunologic: negative    PHYSICAL EXAMINATION: General appearance: alert, cooperative, fatigued and no distress Head: Normocephalic, without obvious abnormality, atraumatic Neck: no adenopathy, no JVD, supple, symmetrical, trachea midline and thyroid not enlarged, symmetric, no tenderness/mass/nodules Lymph nodes: Cervical, supraclavicular, and axillary nodes normal. Resp: clear to auscultation bilaterally Back: symmetric, no curvature. ROM normal. No CVA tenderness. Cardio: regular rate and rhythm, S1, S2 normal, no murmur, click, rub or gallop GI: soft, non-tender; bowel sounds normal; no masses,  no organomegaly Extremities: extremities normal, atraumatic, no cyanosis or edema Neurologic: Alert and oriented X 3, normal strength and tone. Normal symmetric reflexes. Normal coordination and gait  ECOG PERFORMANCE STATUS: 1 - Symptomatic but completely ambulatory  Blood pressure 124/49, pulse 57, temperature 98 F (36.7 C), temperature source Oral, resp. rate 16, height 6' (1.829 m), weight 211 lb 12.8 oz (96.072 kg), SpO2 98 %.  LABORATORY DATA: Lab Results  Component Value Date   WBC 6.6 04/11/2014   HGB 13.8 04/11/2014   HCT 43.6 04/11/2014   MCV 96.9 04/11/2014   PLT 347 04/11/2014      Chemistry      Component Value Date/Time   NA 138 01/10/2014 1512   NA 136 06/19/2012 0530   K 4.5 01/10/2014 1512   K 3.9 06/19/2012 0530   CL 102 06/19/2012 0530   CO2 26 01/10/2014 1512   CO2 25 06/19/2012 0530   BUN 14.5 01/10/2014 1512   BUN 20 06/19/2012 0530   CREATININE 1.2 01/10/2014 1512   CREATININE 1.25 06/19/2012 0530      Component Value Date/Time   CALCIUM 9.0 01/10/2014 1512   CALCIUM 9.2 06/19/2012 0530   ALKPHOS 80 01/10/2014 1512   ALKPHOS 46 06/17/2012 1318   AST 12 01/10/2014 1512   AST 19 06/17/2012 1318   ALT 8 01/10/2014 1512   ALT 16 06/17/2012 1318   BILITOT 0.37 01/10/2014 1512   BILITOT 0.2* 06/17/2012 1318       RADIOGRAPHIC STUDIES: No results found.  ASSESSMENT  AND PLAN: This is a very pleasant 81 years old white male with history of myeloproliferative disorders, polycythemia vera with positive jack 2 mutation. He is currently on hydroxyurea 500 mg by mouth daily and tolerating it fairly well. His CBC is unremarkable today. I recommended for the patient to continue with his current treatment with hydroxyurea 500 mg by mouth daily. I would see him back for follow-up visit in 3 months with repeat CBC, comprehensive metabolic panel and LDH. He was advised to call immediately if he has any concerning symptoms in the interval. The patient voices understanding of current disease status and treatment options and is in agreement with the current care plan.  All questions were answered. The patient knows to call the clinic with any problems, questions or concerns. We can certainly see the patient much sooner if necessary.  I spent 15 minutes counseling the patient face to face. The total time spent in the appointment was 25 minutes.  Disclaimer: This note was dictated with voice recognition software. Similar sounding words can inadvertently be transcribed  and may not be corrected upon review.

## 2014-04-11 NOTE — Telephone Encounter (Signed)
GAVE AND PRINTED APPT SCHED AND AVS FOR PT FOR jULY

## 2014-05-05 ENCOUNTER — Encounter (INDEPENDENT_AMBULATORY_CARE_PROVIDER_SITE_OTHER): Payer: Self-pay | Admitting: Ophthalmology

## 2014-06-07 ENCOUNTER — Other Ambulatory Visit: Payer: Self-pay | Admitting: Hematology

## 2014-06-16 ENCOUNTER — Other Ambulatory Visit: Payer: Self-pay | Admitting: *Deleted

## 2014-06-16 ENCOUNTER — Other Ambulatory Visit: Payer: Self-pay | Admitting: Internal Medicine

## 2014-06-16 DIAGNOSIS — D473 Essential (hemorrhagic) thrombocythemia: Secondary | ICD-10-CM

## 2014-06-16 DIAGNOSIS — D75839 Thrombocytosis, unspecified: Secondary | ICD-10-CM

## 2014-06-16 MED ORDER — HYDROXYUREA 500 MG PO CAPS: ORAL_CAPSULE | ORAL | Status: DC

## 2014-06-16 NOTE — Telephone Encounter (Signed)
gleevac refill sent to pt pharmacy

## 2014-06-17 NOTE — Telephone Encounter (Signed)
Pt called regarding refill on hydrea. Rx e-scribed to [facility] 6/[facility] on 2 different occasions, pt states they do not have it. Call to [facility] to clarify, advised they did not receive a refill request or nes rx for pt. Gave verbal order to refill Hydrea 500mg  1 PO tablet daily.

## 2014-06-20 ENCOUNTER — Telehealth: Payer: Self-pay | Admitting: Internal Medicine

## 2014-06-20 NOTE — Telephone Encounter (Signed)
AM PAL - moved 7/5 appointments to PM. Spoke with patient he is aware.

## 2014-07-12 ENCOUNTER — Telehealth: Payer: Self-pay | Admitting: Internal Medicine

## 2014-07-12 ENCOUNTER — Ambulatory Visit (HOSPITAL_BASED_OUTPATIENT_CLINIC_OR_DEPARTMENT_OTHER): Payer: Medicare Other | Admitting: Internal Medicine

## 2014-07-12 ENCOUNTER — Encounter: Payer: Self-pay | Admitting: Internal Medicine

## 2014-07-12 ENCOUNTER — Other Ambulatory Visit (HOSPITAL_BASED_OUTPATIENT_CLINIC_OR_DEPARTMENT_OTHER): Payer: Medicare Other

## 2014-07-12 VITALS — BP 111/54 | HR 61 | Temp 98.2°F | Resp 61 | Ht 72.0 in | Wt 214.0 lb

## 2014-07-12 DIAGNOSIS — D45 Polycythemia vera: Secondary | ICD-10-CM | POA: Diagnosis not present

## 2014-07-12 DIAGNOSIS — D471 Chronic myeloproliferative disease: Secondary | ICD-10-CM

## 2014-07-12 DIAGNOSIS — D473 Essential (hemorrhagic) thrombocythemia: Secondary | ICD-10-CM

## 2014-07-12 DIAGNOSIS — D75839 Thrombocytosis, unspecified: Secondary | ICD-10-CM

## 2014-07-12 DIAGNOSIS — D72819 Decreased white blood cell count, unspecified: Secondary | ICD-10-CM

## 2014-07-12 LAB — CBC WITH DIFFERENTIAL/PLATELET
BASO%: 1.5 % (ref 0.0–2.0)
BASOS ABS: 0.1 10*3/uL (ref 0.0–0.1)
EOS%: 2.1 % (ref 0.0–7.0)
Eosinophils Absolute: 0.2 10*3/uL (ref 0.0–0.5)
HCT: 42.2 % (ref 38.4–49.9)
HGB: 13.5 g/dL (ref 13.0–17.1)
LYMPH#: 1.8 10*3/uL (ref 0.9–3.3)
LYMPH%: 20 % (ref 14.0–49.0)
MCH: 27.3 pg (ref 27.2–33.4)
MCHC: 31.9 g/dL — AB (ref 32.0–36.0)
MCV: 85.5 fL (ref 79.3–98.0)
MONO#: 1.1 10*3/uL — ABNORMAL HIGH (ref 0.1–0.9)
MONO%: 12.6 % (ref 0.0–14.0)
NEUT#: 5.8 10*3/uL (ref 1.5–6.5)
NEUT%: 63.8 % (ref 39.0–75.0)
Platelets: 450 10*3/uL — ABNORMAL HIGH (ref 140–400)
RBC: 4.94 10*6/uL (ref 4.20–5.82)
RDW: 15.1 % — ABNORMAL HIGH (ref 11.0–14.6)
WBC: 9.1 10*3/uL (ref 4.0–10.3)

## 2014-07-12 LAB — COMPREHENSIVE METABOLIC PANEL (CC13)
ALT: 13 U/L (ref 0–55)
AST: 18 U/L (ref 5–34)
Albumin: 3.5 g/dL (ref 3.5–5.0)
Alkaline Phosphatase: 79 U/L (ref 40–150)
Anion Gap: 7 mEq/L (ref 3–11)
BUN: 17.1 mg/dL (ref 7.0–26.0)
CALCIUM: 9.2 mg/dL (ref 8.4–10.4)
CO2: 25 meq/L (ref 22–29)
Chloride: 106 mEq/L (ref 98–109)
Creatinine: 1.3 mg/dL (ref 0.7–1.3)
EGFR: 52 mL/min/{1.73_m2} — AB (ref >90)
GLUCOSE: 106 mg/dL (ref 70–140)
SODIUM: 139 meq/L (ref 136–145)
Total Bilirubin: 0.38 mg/dL (ref 0.20–1.20)
Total Protein: 7.5 g/dL (ref 6.4–8.3)

## 2014-07-12 LAB — LACTATE DEHYDROGENASE (CC13): LDH: 166 U/L (ref 125–245)

## 2014-07-12 NOTE — Progress Notes (Signed)
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  PRIOR THERAPY: Phlebotomy on as-needed basis.   CURRENT THERAPY: Hydroxyurea 500 mg by mouth daily.   INTERVAL HISTORY: [name] 81 y.o. male returns to the clinic today for follow-up visit accompanied by his girlfriend.  The patient is feeling fine today with no specific complaints. He is tolerating his treatment with Hydrea fairly well with no significant adverse effects. He denied having any weight loss or night sweats. He has peripheral neuropathy from history of diabetes mellitus and diabetic neuropathy. He has no chest pain, shortness of breath, cough or hemoptysis. The patient has repeat CBC and comprehensive metabolic panel performed earlier today and he is here for evaluation and discussion of his lab results.  MEDICAL HISTORY: Past Medical History  Diagnosis Date  . Diabetes mellitus without complication   . Coronary artery disease   . Hypertension   . Hyperlipidemia   . Chest pain     ALLERGIES:  has No Known Allergies.  MEDICATIONS:  Current Outpatient Prescriptions  Medication Sig Dispense Refill  . acetaminophen (TYLENOL) 325 MG tablet Take 650 mg by mouth every 6 (six) hours as needed for moderate pain.    Marland Kitchen aspirin 325 MG tablet Take 325 mg by mouth every morning.     Marland Kitchen BESIVANCE 0.6 % SUSP PUT 1 DROP IN RIGHT EYE 3 TIMES A DAY  1  . CINNAMON PO Take 1 capsule by mouth 2 (two) times daily.    Marland Kitchen gabapentin (NEURONTIN) 100 MG capsule Take 100 mg by mouth 2 (two) times daily.    Marland Kitchen glimepiride (AMARYL) 4 MG tablet Take 4 mg by mouth daily before breakfast.    . HYDROcodone-acetaminophen (NORCO/VICODIN) 5-325 MG per tablet Take 1-2 tablets by mouth every 6 (six) hours as needed for moderate  pain or severe pain. (Patient not taking: Reported on 04/11/2014) 15 tablet 0  . hydroxyurea (HYDREA) 500 MG capsule TAKE ONE CAPSULE BY MOUTH DAILY OR AS DIRECTED.  MAY TAKE WITH FOOD TO MINIMIZE SIDE EFFECTS 30 capsule 3  . hydroxyurea (HYDREA) 500 MG capsule TAKE ONE CAPSULE BY MOUTH DAILY OR AS DIRECTED.  MAY TAKE WITH FOOD TO MINIMIZE SIDE EFFECTS 30 capsule 3  . losartan (COZAAR) 100 MG tablet Take 100 mg by mouth daily.    . metoprolol (LOPRESSOR) 50 MG tablet Take 1 tablet (50 mg total) by mouth 2 (two) times daily. 60 tablet 11  . Multiple Vitamin (MULTIVITAMIN WITH MINERALS) TABS tablet Take 1 tablet by mouth every morning.    . multivitamin (METANX) 3-35-2 MG TABS tablet Take 1 tablet by mouth 2 (two) times daily.    . Omega-3 Fatty Acids (FISH OIL) 1200 MG CAPS Take 1 capsule by mouth 2 (two) times daily.    Marland Kitchen omeprazole (PRILOSEC) 20 MG capsule Take 20 mg by mouth daily.    . simvastatin (ZOCOR) 40 MG tablet Take 40 mg by mouth every evening.     No current facility-administered medications for this visit.    SURGICAL HISTORY:  Past Surgical History  Procedure Laterality Date  . Eye surgery    . Cardiac surgery    . Doppler echocardiography  06/18/2010    EF =>55%,LV normal  . Nm myocar perf wall motion  05/23/2008  EF 62% ,norm myocardial perfusion  . Cardiac catheterization  11/01/2005    patent stents with normal LV function  . Coronary angioplasty      post LAD and RCA stenting  . Cardiac catheterization  11/08/2003    cypher stent mid dominant right coronary lesion  . Holter monitor  11/08/2005    REVIEW OF SYSTEMS:  A comprehensive review of systems was negative except for: Constitutional: positive for fatigue   PHYSICAL EXAMINATION: General appearance: alert, cooperative, fatigued and no distress Head: Normocephalic, without obvious abnormality, atraumatic Neck: no adenopathy, no JVD, supple, symmetrical, trachea midline and thyroid not enlarged, symmetric, no  tenderness/mass/nodules Lymph nodes: Cervical, supraclavicular, and axillary nodes normal. Resp: clear to auscultation bilaterally Back: symmetric, no curvature. ROM normal. No CVA tenderness. Cardio: regular rate and rhythm, S1, S2 normal, no murmur, click, rub or gallop GI: soft, non-tender; bowel sounds normal; no masses,  no organomegaly Extremities: extremities normal, atraumatic, no cyanosis or edema Neurologic: Alert and oriented X 3, normal strength and tone. Normal symmetric reflexes. Normal coordination and gait  ECOG PERFORMANCE STATUS: 1 - Symptomatic but completely ambulatory  Blood pressure 111/54, pulse 61, temperature 98.2 F (36.8 C), temperature source Oral, resp. rate 61, height 6' (1.829 m), weight 214 lb (97.07 kg), SpO2 97 %.  LABORATORY DATA: Lab Results  Component Value Date   WBC 9.1 07/12/2014   HGB 13.5 07/12/2014   HCT 42.2 07/12/2014   MCV 85.5 07/12/2014   PLT 450* 07/12/2014      Chemistry      Component Value Date/Time   NA 139 07/12/2014 1403   NA 136 06/19/2012 0530   K 5.3 No visable hemolysis* 07/12/2014 1403   K 3.9 06/19/2012 0530   CL 102 06/19/2012 0530   CO2 25 07/12/2014 1403   CO2 25 06/19/2012 0530   BUN 17.1 07/12/2014 1403   BUN 20 06/19/2012 0530   CREATININE 1.3 07/12/2014 1403   CREATININE 1.25 06/19/2012 0530      Component Value Date/Time   CALCIUM 9.2 07/12/2014 1403   CALCIUM 9.2 06/19/2012 0530   ALKPHOS 79 07/12/2014 1403   ALKPHOS 46 06/17/2012 1318   AST 18 07/12/2014 1403   AST 19 06/17/2012 1318   ALT 13 07/12/2014 1403   ALT 16 06/17/2012 1318   BILITOT 0.38 07/12/2014 1403   BILITOT 0.2* 06/17/2012 1318       RADIOGRAPHIC STUDIES: No results found.  ASSESSMENT AND PLAN: This is a very pleasant 81 years old white male with history of myeloproliferative disorders, polycythemia vera with positive JAK 2 mutation. He is currently on hydroxyurea 500 mg by mouth daily and tolerating it fairly well. His CBC  is unremarkable today except for slightly elevated platelets count. I recommended for the patient to continue with his current treatment with hydroxyurea 500 mg by mouth daily. I would see him back for follow-up visit in 3 months with repeat CBC, comprehensive metabolic panel and LDH. He was advised to call immediately if he has any concerning symptoms in the interval. The patient voices understanding of current disease status and treatment options and is in agreement with the current care plan.  All questions were answered. The patient knows to call the clinic with any problems, questions or concerns. We can certainly see the patient much sooner if necessary.  Disclaimer: This note was dictated with voice recognition software. Similar sounding words can inadvertently be transcribed and may not be corrected upon review.

## 2014-07-12 NOTE — Telephone Encounter (Signed)
Gave and printed appt sched and avs fo rpt for OCT °

## 2014-07-23 IMAGING — CR DG CHEST 2V
2 series · 2 of 2 positions shown · non-contrast
Comparison: [DATE]

CLINICAL DATA: Weakness.

CHEST - 2 VIEW

[w chest lat]
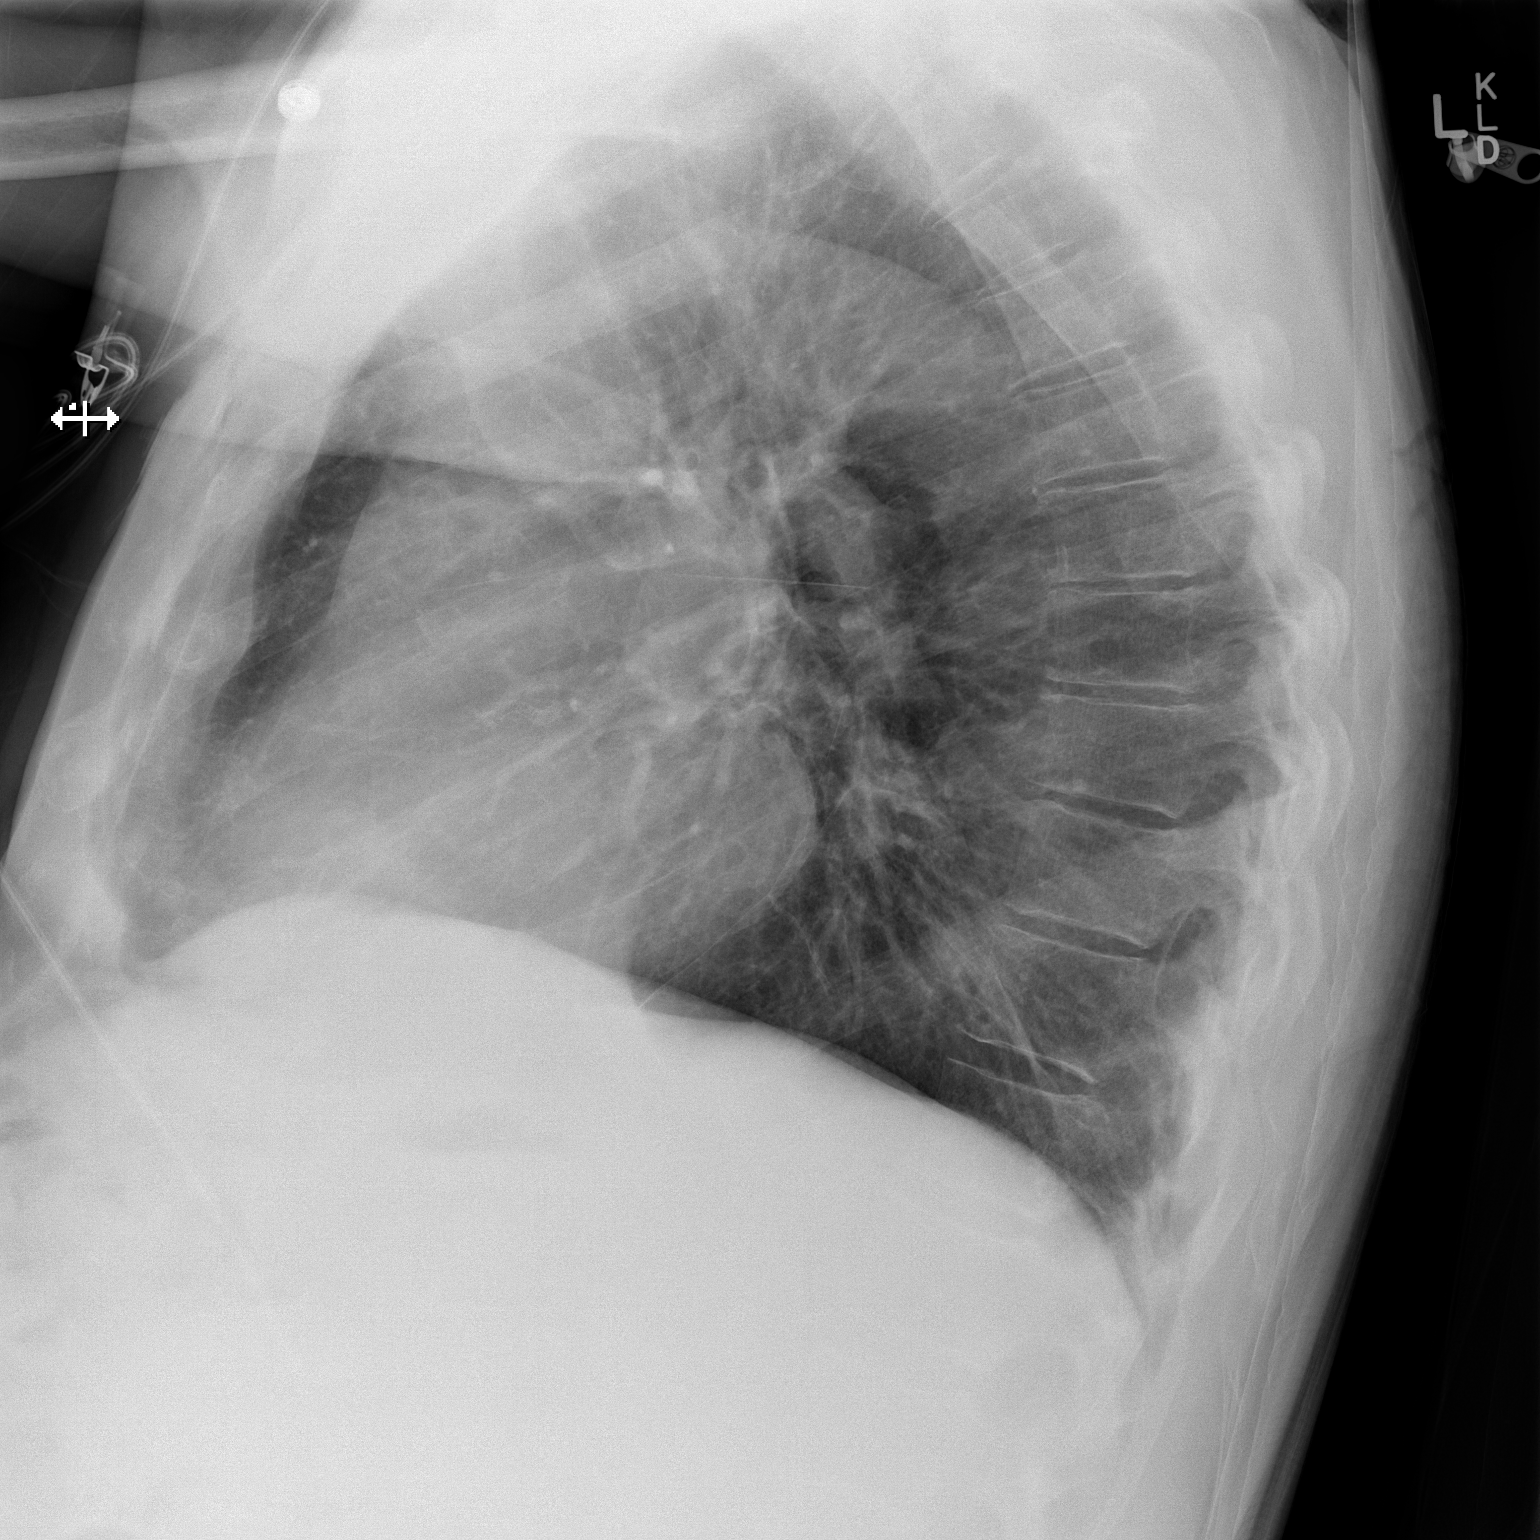

[x chest ap]
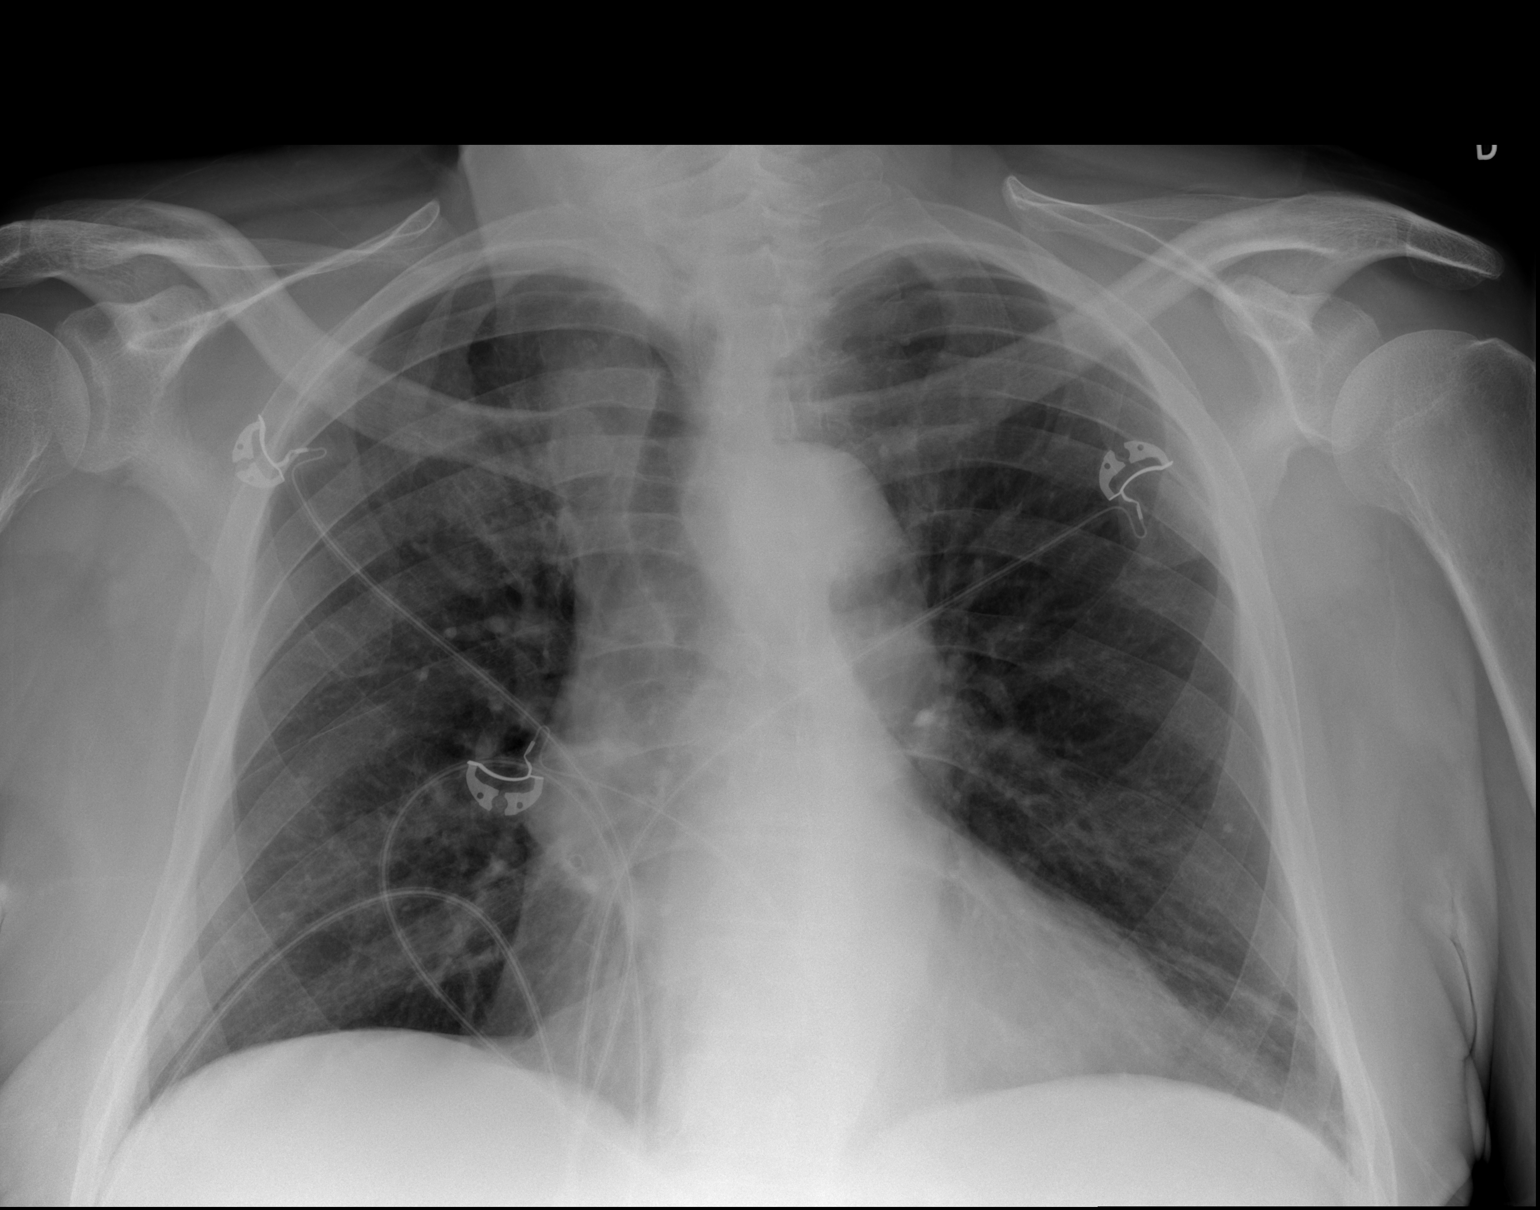

[2 of 2 positions shown; findings below may reference images not displayed]

FINDINGS: Two views of the chest demonstrate clear lungs.  No
evidence for airspace disease or pulmonary edema.  Heart and
mediastinum are within normal limits.  Bony thorax is intact.
IMPRESSION: No acute cardiopulmonary disease.

## 2014-10-12 ENCOUNTER — Other Ambulatory Visit: Payer: Self-pay

## 2014-10-12 ENCOUNTER — Ambulatory Visit: Payer: Self-pay | Admitting: Internal Medicine

## 2014-10-24 ENCOUNTER — Ambulatory Visit (HOSPITAL_BASED_OUTPATIENT_CLINIC_OR_DEPARTMENT_OTHER): Payer: Medicare Other | Admitting: Internal Medicine

## 2014-10-24 ENCOUNTER — Other Ambulatory Visit (HOSPITAL_BASED_OUTPATIENT_CLINIC_OR_DEPARTMENT_OTHER): Payer: Medicare Other

## 2014-10-24 ENCOUNTER — Encounter: Payer: Self-pay | Admitting: Internal Medicine

## 2014-10-24 ENCOUNTER — Telehealth: Payer: Self-pay | Admitting: Internal Medicine

## 2014-10-24 VITALS — BP 118/68 | HR 57 | Temp 98.3°F | Resp 20 | Ht 72.0 in | Wt 213.7 lb

## 2014-10-24 DIAGNOSIS — D72819 Decreased white blood cell count, unspecified: Secondary | ICD-10-CM

## 2014-10-24 DIAGNOSIS — D75839 Thrombocytosis, unspecified: Secondary | ICD-10-CM

## 2014-10-24 DIAGNOSIS — E1140 Type 2 diabetes mellitus with diabetic neuropathy, unspecified: Secondary | ICD-10-CM | POA: Diagnosis not present

## 2014-10-24 DIAGNOSIS — D47Z9 Other specified neoplasms of uncertain behavior of lymphoid, hematopoietic and related tissue: Secondary | ICD-10-CM

## 2014-10-24 DIAGNOSIS — D473 Essential (hemorrhagic) thrombocythemia: Secondary | ICD-10-CM

## 2014-10-24 DIAGNOSIS — D45 Polycythemia vera: Secondary | ICD-10-CM

## 2014-10-24 DIAGNOSIS — D471 Chronic myeloproliferative disease: Secondary | ICD-10-CM

## 2014-10-24 LAB — COMPREHENSIVE METABOLIC PANEL (CC13)
ALBUMIN: 3.3 g/dL — AB (ref 3.5–5.0)
ALK PHOS: 68 U/L (ref 40–150)
ALT: 11 U/L (ref 0–55)
AST: 16 U/L (ref 5–34)
Anion Gap: 6 mEq/L (ref 3–11)
BILIRUBIN TOTAL: 0.41 mg/dL (ref 0.20–1.20)
BUN: 15 mg/dL (ref 7.0–26.0)
CO2: 27 meq/L (ref 22–29)
CREATININE: 1.3 mg/dL (ref 0.7–1.3)
Calcium: 9.2 mg/dL (ref 8.4–10.4)
Chloride: 103 mEq/L (ref 98–109)
EGFR: 50 mL/min/{1.73_m2} — AB (ref >90)
GLUCOSE: 167 mg/dL — AB (ref 70–140)
Potassium: 4.8 mEq/L (ref 3.5–5.1)
Sodium: 136 mEq/L (ref 136–145)
TOTAL PROTEIN: 7.2 g/dL (ref 6.4–8.3)

## 2014-10-24 LAB — CBC WITH DIFFERENTIAL/PLATELET
BASO%: 0.2 % (ref 0.0–2.0)
BASOS ABS: 0 10*3/uL (ref 0.0–0.1)
EOS%: 2 % (ref 0.0–7.0)
Eosinophils Absolute: 0.2 10*3/uL (ref 0.0–0.5)
HCT: 41.6 % (ref 38.4–49.9)
HEMOGLOBIN: 13.2 g/dL (ref 13.0–17.1)
LYMPH%: 17 % (ref 14.0–49.0)
MCH: 27 pg — ABNORMAL LOW (ref 27.2–33.4)
MCHC: 31.8 g/dL — ABNORMAL LOW (ref 32.0–36.0)
MCV: 84.6 fL (ref 79.3–98.0)
MONO#: 0.9 10*3/uL (ref 0.1–0.9)
MONO%: 10.9 % (ref 0.0–14.0)
NEUT%: 69.9 % (ref 39.0–75.0)
NEUTROS ABS: 5.7 10*3/uL (ref 1.5–6.5)
Platelets: 336 10*3/uL (ref 140–400)
RBC: 4.91 10*6/uL (ref 4.20–5.82)
RDW: 15.2 % — ABNORMAL HIGH (ref 11.0–14.6)
WBC: 8.1 10*3/uL (ref 4.0–10.3)
lymph#: 1.4 10*3/uL (ref 0.9–3.3)

## 2014-10-24 LAB — LACTATE DEHYDROGENASE (CC13): LDH: 139 U/L (ref 125–245)

## 2014-10-24 NOTE — Progress Notes (Signed)
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  PRIOR THERAPY: Phlebotomy on as-needed basis.   CURRENT THERAPY: Hydroxyurea 500 mg by mouth daily.   INTERVAL HISTORY: [name] 81 y.o. male returns to the clinic today for four-month follow-up visit accompanied by his girlfriend.  The patient is feeling fine today with no specific complaints. He is tolerating his treatment with Hydrea fairly well with no significant adverse effects. He denied having any weight loss or night sweats. He continues to have peripheral neuropathy from history of diabetes mellitus and diabetic neuropathy but still able to ambulate and walk well. He has no chest pain, shortness of breath, cough or hemoptysis. The patient has repeat CBC and comprehensive metabolic panel performed earlier today and he is here for evaluation and discussion of his lab results.  MEDICAL HISTORY: Past Medical History  Diagnosis Date  . Diabetes mellitus without complication   . Coronary artery disease   . Hypertension   . Hyperlipidemia   . Chest pain     ALLERGIES:  has No Known Allergies.  MEDICATIONS:  Current Outpatient Prescriptions  Medication Sig Dispense Refill  . acetaminophen (TYLENOL) 325 MG tablet Take 650 mg by mouth every 6 (six) hours as needed for moderate pain.    Marland Kitchen aspirin 325 MG tablet Take 325 mg by mouth every morning.     Marland Kitchen BESIVANCE 0.6 % SUSP PUT 1 DROP IN RIGHT EYE 3 TIMES A DAY  1  . CINNAMON PO Take 1 capsule by mouth 2 (two) times daily.    Marland Kitchen gabapentin (NEURONTIN) 100 MG capsule Take 100 mg by mouth 2 (two) times daily.    Marland Kitchen glimepiride (AMARYL) 4 MG tablet Take 4 mg by mouth daily before breakfast.    . HYDROcodone-acetaminophen (NORCO/VICODIN) 5-325 MG per tablet Take  1-2 tablets by mouth every 6 (six) hours as needed for moderate pain or severe pain. (Patient not taking: Reported on 04/11/2014) 15 tablet 0  . hydroxyurea (HYDREA) 500 MG capsule TAKE ONE CAPSULE BY MOUTH DAILY OR AS DIRECTED.  MAY TAKE WITH FOOD TO MINIMIZE SIDE EFFECTS 30 capsule 3  . hydroxyurea (HYDREA) 500 MG capsule TAKE ONE CAPSULE BY MOUTH DAILY OR AS DIRECTED.  MAY TAKE WITH FOOD TO MINIMIZE SIDE EFFECTS 30 capsule 3  . losartan (COZAAR) 100 MG tablet Take 100 mg by mouth daily.    . metoprolol (LOPRESSOR) 50 MG tablet Take 1 tablet (50 mg total) by mouth 2 (two) times daily. 60 tablet 11  . Multiple Vitamin (MULTIVITAMIN WITH MINERALS) TABS tablet Take 1 tablet by mouth every morning.    . multivitamin (METANX) 3-35-2 MG TABS tablet Take 1 tablet by mouth 2 (two) times daily.    . Omega-3 Fatty Acids (FISH OIL) 1200 MG CAPS Take 1 capsule by mouth 2 (two) times daily.    Marland Kitchen omeprazole (PRILOSEC) 20 MG capsule Take 20 mg by mouth daily.    . simvastatin (ZOCOR) 40 MG tablet Take 40 mg by mouth every evening.     No current facility-administered medications for this visit.    SURGICAL HISTORY:  Past Surgical History  Procedure Laterality Date  . Eye surgery    . Cardiac surgery    . Doppler echocardiography  06/18/2010    EF =>55%,LV normal  .  Nm myocar perf wall motion  05/23/2008    EF 62% ,norm myocardial perfusion  . Cardiac catheterization  11/01/2005    patent stents with normal LV function  . Coronary angioplasty      post LAD and RCA stenting  . Cardiac catheterization  11/08/2003    cypher stent mid dominant right coronary lesion  . Holter monitor  11/08/2005    REVIEW OF SYSTEMS:  A comprehensive review of systems was negative except for: Neurological: positive for paresthesia   PHYSICAL EXAMINATION: General appearance: alert, cooperative, fatigued and no distress Head: Normocephalic, without obvious abnormality, atraumatic Neck: no adenopathy, no JVD, supple,  symmetrical, trachea midline and thyroid not enlarged, symmetric, no tenderness/mass/nodules Lymph nodes: Cervical, supraclavicular, and axillary nodes normal. Resp: clear to auscultation bilaterally Back: symmetric, no curvature. ROM normal. No CVA tenderness. Cardio: regular rate and rhythm, S1, S2 normal, no murmur, click, rub or gallop GI: soft, non-tender; bowel sounds normal; no masses,  no organomegaly Extremities: extremities normal, atraumatic, no cyanosis or edema Neurologic: Alert and oriented X 3, normal strength and tone. Normal symmetric reflexes. Normal coordination and gait  ECOG PERFORMANCE STATUS: 1 - Symptomatic but completely ambulatory  Blood pressure 118/68, pulse 57, temperature 98.3 F (36.8 C), temperature source Oral, resp. rate 20, height 6' (1.829 m), weight 213 lb 11.2 oz (96.934 kg).  LABORATORY DATA: Lab Results  Component Value Date   WBC 8.1 10/24/2014   HGB 13.2 10/24/2014   HCT 41.6 10/24/2014   MCV 84.6 10/24/2014   PLT 336 10/24/2014      Chemistry      Component Value Date/Time   NA 139 07/12/2014 1403   NA 136 06/19/2012 0530   K 5.3 No visable hemolysis* 07/12/2014 1403   K 3.9 06/19/2012 0530   CL 102 06/19/2012 0530   CO2 25 07/12/2014 1403   CO2 25 06/19/2012 0530   BUN 17.1 07/12/2014 1403   BUN 20 06/19/2012 0530   CREATININE 1.3 07/12/2014 1403   CREATININE 1.25 06/19/2012 0530      Component Value Date/Time   CALCIUM 9.2 07/12/2014 1403   CALCIUM 9.2 06/19/2012 0530   ALKPHOS 79 07/12/2014 1403   ALKPHOS 46 06/17/2012 1318   AST 18 07/12/2014 1403   AST 19 06/17/2012 1318   ALT 13 07/12/2014 1403   ALT 16 06/17/2012 1318   BILITOT 0.38 07/12/2014 1403   BILITOT 0.2* 06/17/2012 1318       RADIOGRAPHIC STUDIES: No results found.  ASSESSMENT AND PLAN: This is a very pleasant 81 years old white male with history of myeloproliferative disorders, polycythemia vera with positive JAK 2 mutation. He is currently on  hydroxyurea 500 mg by mouth daily and tolerating it fairly well. His CBC is unremarkable for any abnormality today. I recommended for the patient to continue with his current treatment with hydroxyurea 500 mg by mouth daily. I would see him back for follow-up visit in 3 months with repeat CBC, comprehensive metabolic panel and LDH. He was advised to call immediately if he has any concerning symptoms in the interval. The patient voices understanding of current disease status and treatment options and is in agreement with the current care plan.  All questions were answered. The patient knows to call the clinic with any problems, questions or concerns. We can certainly see the patient much sooner if necessary.  Disclaimer: This note was dictated with voice recognition software. Similar sounding words can inadvertently be transcribed and may not be corrected upon  review.

## 2014-10-24 NOTE — Telephone Encounter (Signed)
Gave patient avs report and appointments for January 2017. °

## 2014-11-11 ENCOUNTER — Other Ambulatory Visit: Payer: Self-pay | Admitting: Internal Medicine

## 2014-11-29 ENCOUNTER — Ambulatory Visit: Payer: Self-pay | Admitting: Cardiovascular Disease

## 2014-12-06 ENCOUNTER — Encounter: Payer: Self-pay | Admitting: Cardiovascular Disease

## 2014-12-06 ENCOUNTER — Ambulatory Visit (INDEPENDENT_AMBULATORY_CARE_PROVIDER_SITE_OTHER): Payer: Medicare Other | Admitting: Cardiovascular Disease

## 2014-12-06 VITALS — BP 118/64 | HR 59 | Ht 73.0 in | Wt 215.0 lb

## 2014-12-06 DIAGNOSIS — I10 Essential (primary) hypertension: Secondary | ICD-10-CM | POA: Diagnosis not present

## 2014-12-06 DIAGNOSIS — I2510 Atherosclerotic heart disease of native coronary artery without angina pectoris: Secondary | ICD-10-CM

## 2014-12-06 NOTE — Assessment & Plan Note (Signed)
hhistory of coronary artery disease status post LAD and RCA stenting in the past. He had a cardiac catheterization by Dr. Melvern Banker in 2007 and was found to have patent stents. He had a Myoview stress test and 2-D echo performed in October 2014 which were normal. He denies chest pain or shortness of breath.

## 2014-12-06 NOTE — Assessment & Plan Note (Signed)
History of hypertension blood pressure measured at 118/64. He is on losartan and metoprolol. Continue current meds at current dosing

## 2014-12-06 NOTE — Assessment & Plan Note (Signed)
History of hyperlipidemia on simvastatin followed by his PCP 

## 2014-12-06 NOTE — Patient Instructions (Signed)

## 2014-12-06 NOTE — Progress Notes (Signed)
12/06/2014 [name]   1932/04/10  [id number]  Primary Physician [name], MD Primary Cardiologist: Lorretta Harp MD Renae Gloss   HPI:  The patient is a very pleasant 81 year old mildly overweight widowed Caucasian male (wife died 08/27/10 at age 16), father of 58, grandfather to 6 grandchildren who I last saw 12 months ago. He has a history of CAD status post LAD and RCA stenting in the past. He was last catheterized by Dr. Melvern Banker in 2007 and found to have patent stents with normal LV function. His other problems include hypertension, hyperlipidemia and diabetes. He does not smoke or drink.Since I saw him 11/26/13 he has been relatively asymptomatic. He had a Myoview stress test and 2-D echo performed in October 2014 year which were normal. He has also gotten engaged since I last saw him. He complains of erectile dysfunction.   Current Outpatient Prescriptions  Medication Sig Dispense Refill  . acetaminophen (TYLENOL) 325 MG tablet Take 650 mg by mouth every 6 (six) hours as needed for moderate pain.    Marland Kitchen aspirin 325 MG tablet Take 325 mg by mouth every morning.     Marland Kitchen CINNAMON PO Take 1 capsule by mouth 2 (two) times daily.    Marland Kitchen gabapentin (NEURONTIN) 100 MG capsule Take 100 mg by mouth 2 (two) times daily.    Marland Kitchen glimepiride (AMARYL) 4 MG tablet Take 4 mg by mouth daily before breakfast.    . HYDROcodone-acetaminophen (NORCO/VICODIN) 5-325 MG per tablet Take 1-2 tablets by mouth every 6 (six) hours as needed for moderate pain or severe pain. 15 tablet 0  . hydroxyurea (HYDREA) 500 MG capsule TAKE 1 CAPSULE BY MOUTH EVERY DAY AS DIRECTED 30 capsule 0  . losartan (COZAAR) 100 MG tablet Take 100 mg by mouth daily.    . metoprolol (LOPRESSOR) 50 MG tablet Take 1 tablet (50 mg total) by mouth 2 (two) times daily. 60 tablet 11  . Multiple Vitamin (MULTIVITAMIN WITH MINERALS) TABS tablet Take 1 tablet by mouth every morning.    . Omega-3 Fatty Acids (FISH OIL)  1200 MG CAPS Take 1 capsule by mouth 2 (two) times daily.    Marland Kitchen omeprazole (PRILOSEC) 20 MG capsule Take 20 mg by mouth daily.    . simvastatin (ZOCOR) 40 MG tablet Take 40 mg by mouth every evening.     No current facility-administered medications for this visit.    No Known Allergies  Social History   Social History  . Marital Status: Married    Spouse Name: N/A  . Number of Children: 4  . Years of Education: 12   Occupational History  . Not on file.   Social History Main Topics  . Smoking status: Former Smoker    Types: Cigarettes  . Smokeless tobacco: Former Systems developer    Quit date: 10/31/1982  . Alcohol Use: Yes     Comment: 1 glass wine daily  . Drug Use: No  . Sexual Activity: No   Other Topics Concern  . Not on file   Social History Narrative     Review of Systems: General: negative for chills, fever, night sweats or weight changes.  Cardiovascular: negative for chest pain, dyspnea on exertion, edema, orthopnea, palpitations, paroxysmal nocturnal dyspnea or shortness of breath Dermatological: negative for rash Respiratory: negative for cough or wheezing Urologic: negative for hematuria Abdominal: negative for nausea, vomiting, diarrhea, bright red blood per rectum, melena, or hematemesis Neurologic: negative for visual changes, syncope, or  dizziness All other systems reviewed and are otherwise negative except as noted above.    Blood pressure 118/64, pulse 59, height 6\' 1"  (1.854 m), weight 215 lb (97.523 kg).  General appearance: alert and no distress Neck: no adenopathy, no carotid bruit, no JVD, supple, symmetrical, trachea midline and thyroid not enlarged, symmetric, no tenderness/mass/nodules Lungs: clear to auscultation bilaterally Heart: regular rate and rhythm, S1, S2 normal, no murmur, click, rub or gallop Extremities: extremities normal, atraumatic, no cyanosis or edema  EKG sinus bradycardia 59 with septal Q waves. I personally reviewed this  EKG  ASSESSMENT AND PLAN:   HYPERLIPIDEMIA History of hyperlipidemia on simvastatin followed by his PCP  Essential hypertension History of hypertension blood pressure measured at 118/64. He is on losartan and metoprolol. Continue current meds at current dosing  Coronary atherosclerosis hhistory of coronary artery disease status post LAD and RCA stenting in the past. He had a cardiac catheterization by Dr. Melvern Banker in 2007 and was found to have patent stents. He had a Myoview stress test and 2-D echo performed in October 2014 which were normal. He denies chest pain or shortness of breath.      Lorretta Harp MD FACP,FACC,FAHA, [facility] 12/06/2014 2:56 PM

## 2014-12-09 ENCOUNTER — Other Ambulatory Visit: Payer: Self-pay | Admitting: Internal Medicine

## 2014-12-09 ENCOUNTER — Other Ambulatory Visit: Payer: Self-pay | Admitting: Cardiovascular Disease

## 2014-12-12 NOTE — Telephone Encounter (Signed)
Rx(s) sent to pharmacy electronically.  

## 2015-01-10 DIAGNOSIS — L570 Actinic keratosis: Secondary | ICD-10-CM | POA: Diagnosis not present

## 2015-01-10 DIAGNOSIS — C44619 Basal cell carcinoma of skin of left upper limb, including shoulder: Secondary | ICD-10-CM | POA: Diagnosis not present

## 2015-01-10 DIAGNOSIS — L219 Seborrheic dermatitis, unspecified: Secondary | ICD-10-CM | POA: Diagnosis not present

## 2015-01-11 ENCOUNTER — Other Ambulatory Visit: Payer: Self-pay | Admitting: Internal Medicine

## 2015-01-23 ENCOUNTER — Ambulatory Visit (HOSPITAL_BASED_OUTPATIENT_CLINIC_OR_DEPARTMENT_OTHER): Payer: PPO | Admitting: Internal Medicine

## 2015-01-23 ENCOUNTER — Other Ambulatory Visit (HOSPITAL_BASED_OUTPATIENT_CLINIC_OR_DEPARTMENT_OTHER): Payer: PPO

## 2015-01-23 ENCOUNTER — Encounter: Payer: Self-pay | Admitting: Internal Medicine

## 2015-01-23 ENCOUNTER — Telehealth: Payer: Self-pay | Admitting: Internal Medicine

## 2015-01-23 VITALS — BP 125/65 | HR 59 | Temp 98.6°F | Resp 18 | Ht 73.0 in | Wt 216.2 lb

## 2015-01-23 DIAGNOSIS — D471 Chronic myeloproliferative disease: Secondary | ICD-10-CM

## 2015-01-23 DIAGNOSIS — D47Z9 Other specified neoplasms of uncertain behavior of lymphoid, hematopoietic and related tissue: Secondary | ICD-10-CM | POA: Diagnosis not present

## 2015-01-23 DIAGNOSIS — D473 Essential (hemorrhagic) thrombocythemia: Secondary | ICD-10-CM

## 2015-01-23 DIAGNOSIS — D45 Polycythemia vera: Secondary | ICD-10-CM

## 2015-01-23 DIAGNOSIS — D75839 Thrombocytosis, unspecified: Secondary | ICD-10-CM

## 2015-01-23 LAB — COMPREHENSIVE METABOLIC PANEL
ALBUMIN: 3.5 g/dL (ref 3.5–5.0)
ALK PHOS: 76 U/L (ref 40–150)
ALT: 12 U/L (ref 0–55)
AST: 16 U/L (ref 5–34)
Anion Gap: 9 mEq/L (ref 3–11)
BUN: 16.5 mg/dL (ref 7.0–26.0)
CO2: 26 meq/L (ref 22–29)
Calcium: 9.4 mg/dL (ref 8.4–10.4)
Chloride: 102 mEq/L (ref 98–109)
Creatinine: 1.3 mg/dL (ref 0.7–1.3)
EGFR: 51 mL/min/{1.73_m2} — ABNORMAL LOW (ref >90)
Glucose: 134 mg/dl (ref 70–140)
POTASSIUM: 4.4 meq/L (ref 3.5–5.1)
SODIUM: 136 meq/L (ref 136–145)
Total Bilirubin: 0.49 mg/dL (ref 0.20–1.20)
Total Protein: 8.1 g/dL (ref 6.4–8.3)

## 2015-01-23 LAB — CBC WITH DIFFERENTIAL/PLATELET
BASO%: 1.4 % (ref 0.0–2.0)
BASOS ABS: 0.1 10*3/uL (ref 0.0–0.1)
EOS ABS: 0.2 10*3/uL (ref 0.0–0.5)
EOS%: 2.1 % (ref 0.0–7.0)
HEMATOCRIT: 44.6 % (ref 38.4–49.9)
HEMOGLOBIN: 13.8 g/dL (ref 13.0–17.1)
LYMPH%: 14.7 % (ref 14.0–49.0)
MCH: 26.7 pg — ABNORMAL LOW (ref 27.2–33.4)
MCHC: 30.9 g/dL — ABNORMAL LOW (ref 32.0–36.0)
MCV: 86.4 fL (ref 79.3–98.0)
MONO#: 1.2 10*3/uL — ABNORMAL HIGH (ref 0.1–0.9)
MONO%: 11.9 % (ref 0.0–14.0)
NEUT#: 7 10*3/uL — ABNORMAL HIGH (ref 1.5–6.5)
NEUT%: 69.9 % (ref 39.0–75.0)
Platelets: 480 10*3/uL — ABNORMAL HIGH (ref 140–400)
RBC: 5.16 10*6/uL (ref 4.20–5.82)
RDW: 14.2 % (ref 11.0–14.6)
WBC: 10.1 10*3/uL (ref 4.0–10.3)
lymph#: 1.5 10*3/uL (ref 0.9–3.3)

## 2015-01-23 LAB — LACTATE DEHYDROGENASE: LDH: 179 U/L (ref 125–245)

## 2015-01-23 MED ORDER — HYDROXYUREA 500 MG PO CAPS: 500.0000 mg | ORAL_CAPSULE | Freq: Every day | ORAL | Status: DC

## 2015-01-23 NOTE — Progress Notes (Signed)
[place] Telephone:([phone]   Fax:(336) [place], [place] [place]  [address]  DIAGNOSIS: Myeloproliferative disorder suspicious for polycythemia vera with positive JAK- 2 mutation.  PRIOR THERAPY: Phlebotomy on as-needed basis.   CURRENT THERAPY: Hydroxyurea 500 mg by mouth daily.   INTERVAL HISTORY: [name] 82 y.o. male returns to the clinic today for four-month follow-up visit accompanied by his girlfriend.  The patient is feeling fine today with no specific complaints. He is tolerating his treatment with Hydrea fairly well with no significant adverse effects. He denied having any weight loss or night sweats.He has no chest pain, shortness of breath, cough or hemoptysis. The patient has repeat CBC and comprehensive metabolic panel performed earlier today and he is here for evaluation and discussion of his lab results. He is requesting 3 months refill of hydroxyurea.  MEDICAL HISTORY: Past Medical History  Diagnosis Date  . Diabetes mellitus without complication ([place])   . Coronary artery disease   . Hypertension   . Hyperlipidemia   . Chest pain     ALLERGIES:  has No Known Allergies.  MEDICATIONS:  Current Outpatient Prescriptions  Medication Sig Dispense Refill  . aspirin 325 MG tablet Take 325 mg by mouth every morning.     Marland Kitchen CINNAMON PO Take 1 capsule by mouth 2 (two) times daily.    Marland Kitchen gabapentin (NEURONTIN) 100 MG capsule Take 100 mg by mouth 2 (two) times daily.    Marland Kitchen glimepiride (AMARYL) 4 MG tablet Take 4 mg by mouth daily before breakfast.    . hydrocortisone 2.5 % cream APPLY TOPICALLY TO AFFECTED AREA TO RASH  QD PRN  1  . hydroxyurea (HYDREA) 500 MG capsule TAKE 1 CAPSULE BY MOUTH EVERY DAY AS DIRECTED 30 capsule 0  . losartan (COZAAR) 100 MG tablet Take 100 mg by mouth daily.    . metoprolol (LOPRESSOR) 50 MG tablet TAKE 1 TABLET BY MOUTH TWICE DAILY 180 tablet 3  .  Multiple Vitamin (MULTIVITAMIN WITH MINERALS) TABS tablet Take 1 tablet by mouth every morning.    . Omega-3 Fatty Acids (FISH OIL) 1200 MG CAPS Take 1 capsule by mouth 2 (two) times daily.    Marland Kitchen omeprazole (PRILOSEC) 20 MG capsule Take 20 mg by mouth daily.    . simvastatin (ZOCOR) 40 MG tablet Take 40 mg by mouth every evening.    Marland Kitchen acetaminophen (TYLENOL) 325 MG tablet Take 650 mg by mouth every 6 (six) hours as needed for moderate pain. Reported on 01/23/2015    . HYDROcodone-acetaminophen (NORCO/VICODIN) 5-325 MG per tablet Take 1-2 tablets by mouth every 6 (six) hours as needed for moderate pain or severe pain. (Patient not taking: Reported on 01/23/2015) 15 tablet 0   No current facility-administered medications for this visit.    SURGICAL HISTORY:  Past Surgical History  Procedure Laterality Date  . Eye surgery    . Cardiac surgery    . Doppler echocardiography  06/18/2010    EF =>55%,LV normal  . Nm myocar perf wall motion  05/23/2008    EF 62% ,norm myocardial perfusion  . Cardiac catheterization  11/01/2005    patent stents with normal LV function  . Coronary angioplasty      post LAD and RCA stenting  . Cardiac catheterization  11/08/2003    cypher stent mid dominant right coronary lesion  . Holter monitor  11/08/2005    REVIEW OF  SYSTEMS:  A comprehensive review of systems was negative.   PHYSICAL EXAMINATION: General appearance: alert, cooperative, fatigued and no distress Head: Normocephalic, without obvious abnormality, atraumatic Neck: no adenopathy, no JVD, supple, symmetrical, trachea midline and thyroid not enlarged, symmetric, no tenderness/mass/nodules Lymph nodes: Cervical, supraclavicular, and axillary nodes normal. Resp: clear to auscultation bilaterally Back: symmetric, no curvature. ROM normal. No CVA tenderness. Cardio: regular rate and rhythm, S1, S2 normal, no murmur, click, rub or gallop GI: soft, non-tender; bowel sounds normal; no masses,  no  organomegaly Extremities: extremities normal, atraumatic, no cyanosis or edema Neurologic: Alert and oriented X 3, normal strength and tone. Normal symmetric reflexes. Normal coordination and gait  ECOG PERFORMANCE STATUS: 1 - Symptomatic but completely ambulatory  Blood pressure 125/65, pulse 59, temperature 98.6 F (37 C), temperature source Oral, resp. rate 18, height 6\' 1"  (1.854 m), weight 216 lb 3.2 oz (98.068 kg), SpO2 98 %.  LABORATORY DATA: Lab Results  Component Value Date   WBC 10.1 01/23/2015   HGB 13.8 01/23/2015   HCT 44.6 01/23/2015   MCV 86.4 01/23/2015   PLT 480* 01/23/2015      Chemistry      Component Value Date/Time   NA 136 10/24/2014 0936   NA 136 06/19/2012 0530   K 4.8 10/24/2014 0936   K 3.9 06/19/2012 0530   CL 102 06/19/2012 0530   CO2 27 10/24/2014 0936   CO2 25 06/19/2012 0530   BUN 15.0 10/24/2014 0936   BUN 20 06/19/2012 0530   CREATININE 1.3 10/24/2014 0936   CREATININE 1.25 06/19/2012 0530      Component Value Date/Time   CALCIUM 9.2 10/24/2014 0936   CALCIUM 9.2 06/19/2012 0530   ALKPHOS 68 10/24/2014 0936   ALKPHOS 46 06/17/2012 1318   AST 16 10/24/2014 0936   AST 19 06/17/2012 1318   ALT 11 10/24/2014 0936   ALT 16 06/17/2012 1318   BILITOT 0.41 10/24/2014 0936   BILITOT 0.2* 06/17/2012 1318       RADIOGRAPHIC STUDIES: No results found.  ASSESSMENT AND PLAN: This is a very pleasant 82 years old white male with history of myeloproliferative disorders, polycythemia vera with positive JAK 2 mutation. He is currently on hydroxyurea 500 mg by mouth daily and tolerating it fairly well. His CBC showed increase in the platelets count up to 480,000. I recommended for the patient to continue with his current treatment with hydroxyurea 500 mg by mouth daily. I gave him a refill for three-month supply. I would see him back for follow-up visit in 3 months with repeat CBC, comprehensive metabolic panel and LDH. He was advised to call  immediately if he has any concerning symptoms in the interval. The patient voices understanding of current disease status and treatment options and is in agreement with the current care plan.  All questions were answered. The patient knows to call the clinic with any problems, questions or concerns. We can certainly see the patient much sooner if necessary.  Disclaimer: This note was dictated with voice recognition software. Similar sounding words can inadvertently be transcribed and may not be corrected upon review.

## 2015-01-23 NOTE — Telephone Encounter (Signed)
per pof to sch pt appt-gave pt copy of avs °

## 2015-04-04 DIAGNOSIS — Z125 Encounter for screening for malignant neoplasm of prostate: Secondary | ICD-10-CM | POA: Diagnosis not present

## 2015-04-04 DIAGNOSIS — E1142 Type 2 diabetes mellitus with diabetic polyneuropathy: Secondary | ICD-10-CM | POA: Diagnosis not present

## 2015-04-04 DIAGNOSIS — E119 Type 2 diabetes mellitus without complications: Secondary | ICD-10-CM | POA: Diagnosis not present

## 2015-04-04 DIAGNOSIS — I10 Essential (primary) hypertension: Secondary | ICD-10-CM | POA: Diagnosis not present

## 2015-04-11 DIAGNOSIS — E039 Hypothyroidism, unspecified: Secondary | ICD-10-CM | POA: Diagnosis not present

## 2015-04-11 DIAGNOSIS — E119 Type 2 diabetes mellitus without complications: Secondary | ICD-10-CM | POA: Diagnosis not present

## 2015-04-11 DIAGNOSIS — E7800 Pure hypercholesterolemia, unspecified: Secondary | ICD-10-CM | POA: Diagnosis not present

## 2015-04-11 DIAGNOSIS — I10 Essential (primary) hypertension: Secondary | ICD-10-CM | POA: Diagnosis not present

## 2015-04-25 ENCOUNTER — Ambulatory Visit (HOSPITAL_BASED_OUTPATIENT_CLINIC_OR_DEPARTMENT_OTHER): Payer: PPO | Admitting: Internal Medicine

## 2015-04-25 ENCOUNTER — Other Ambulatory Visit (HOSPITAL_BASED_OUTPATIENT_CLINIC_OR_DEPARTMENT_OTHER): Payer: PPO

## 2015-04-25 ENCOUNTER — Encounter: Payer: Self-pay | Admitting: Internal Medicine

## 2015-04-25 ENCOUNTER — Telehealth: Payer: Self-pay | Admitting: Internal Medicine

## 2015-04-25 VITALS — BP 124/64 | HR 62 | Temp 98.1°F | Resp 18 | Ht 73.0 in | Wt 211.5 lb

## 2015-04-25 DIAGNOSIS — D47Z9 Other specified neoplasms of uncertain behavior of lymphoid, hematopoietic and related tissue: Secondary | ICD-10-CM

## 2015-04-25 DIAGNOSIS — D75839 Thrombocytosis, unspecified: Secondary | ICD-10-CM

## 2015-04-25 DIAGNOSIS — D45 Polycythemia vera: Secondary | ICD-10-CM

## 2015-04-25 DIAGNOSIS — D473 Essential (hemorrhagic) thrombocythemia: Secondary | ICD-10-CM

## 2015-04-25 DIAGNOSIS — D471 Chronic myeloproliferative disease: Secondary | ICD-10-CM

## 2015-04-25 LAB — CBC WITH DIFFERENTIAL/PLATELET
BASO%: 1.2 % (ref 0.0–2.0)
Basophils Absolute: 0.1 10*3/uL (ref 0.0–0.1)
EOS%: 1.9 % (ref 0.0–7.0)
Eosinophils Absolute: 0.2 10*3/uL (ref 0.0–0.5)
HCT: 42.7 % (ref 38.4–49.9)
HGB: 13.2 g/dL (ref 13.0–17.1)
LYMPH%: 13.9 % — AB (ref 14.0–49.0)
MCH: 26.5 pg — ABNORMAL LOW (ref 27.2–33.4)
MCHC: 30.9 g/dL — AB (ref 32.0–36.0)
MCV: 85.7 fL (ref 79.3–98.0)
MONO#: 1.3 10*3/uL — AB (ref 0.1–0.9)
MONO%: 12.6 % (ref 0.0–14.0)
NEUT#: 7.1 10*3/uL — ABNORMAL HIGH (ref 1.5–6.5)
NEUT%: 70.4 % (ref 39.0–75.0)
PLATELETS: 459 10*3/uL — AB (ref 140–400)
RBC: 4.98 10*6/uL (ref 4.20–5.82)
RDW: 15.1 % — ABNORMAL HIGH (ref 11.0–14.6)
WBC: 10.1 10*3/uL (ref 4.0–10.3)
lymph#: 1.4 10*3/uL (ref 0.9–3.3)

## 2015-04-25 LAB — COMPREHENSIVE METABOLIC PANEL
ALT: 10 U/L (ref 0–55)
ANION GAP: 8 meq/L (ref 3–11)
AST: 18 U/L (ref 5–34)
Albumin: 3.3 g/dL — ABNORMAL LOW (ref 3.5–5.0)
Alkaline Phosphatase: 76 U/L (ref 40–150)
BUN: 16.1 mg/dL (ref 7.0–26.0)
CHLORIDE: 105 meq/L (ref 98–109)
CO2: 26 meq/L (ref 22–29)
Calcium: 9.4 mg/dL (ref 8.4–10.4)
Creatinine: 1.3 mg/dL (ref 0.7–1.3)
EGFR: 50 mL/min/{1.73_m2} — AB (ref >90)
GLUCOSE: 160 mg/dL — AB (ref 70–140)
Potassium: 4.9 mEq/L (ref 3.5–5.1)
SODIUM: 139 meq/L (ref 136–145)
Total Bilirubin: 0.46 mg/dL (ref 0.20–1.20)
Total Protein: 8 g/dL (ref 6.4–8.3)

## 2015-04-25 LAB — LACTATE DEHYDROGENASE: LDH: 157 U/L (ref 125–245)

## 2015-04-25 NOTE — Addendum Note (Signed)
Addended by: Lucile Crater on: 04/25/2015 09:30 AM   Modules accepted: Medications

## 2015-04-25 NOTE — Progress Notes (Signed)
[place] Telephone:([phone]   Fax:(336) [place], [place] [place] [address]  DIAGNOSIS: Myeloproliferative disorder suspicious for polycythemia vera with positive JAK- 2 mutation.  PRIOR THERAPY: Phlebotomy on as-needed basis.   CURRENT THERAPY: Hydroxyurea 500 mg by mouth daily.   INTERVAL HISTORY: [name] 82 y.o. male returns to the clinic today for four-month follow-up visit. He has no significant change since her last visit. The patient is feeling fine today with no specific complaints. He is tolerating his treatment with Hydrea fairly well with no significant adverse effects. He denied having any weight loss or night sweats. He has no chest pain, shortness of breath, cough or hemoptysis. The patient has repeat CBC and comprehensive metabolic panel performed earlier today and he is here for evaluation and discussion of his lab results.   MEDICAL HISTORY: Past Medical History  Diagnosis Date  . Diabetes mellitus without complication ([place])   . Coronary artery disease   . Hypertension   . Hyperlipidemia   . Chest pain     ALLERGIES:  has No Known Allergies.  MEDICATIONS:  Current Outpatient Prescriptions  Medication Sig Dispense Refill  . acetaminophen (TYLENOL) 325 MG tablet Take 650 mg by mouth every 6 (six) hours as needed for moderate pain. Reported on 01/23/2015    . aspirin 325 MG tablet Take 325 mg by mouth every morning.     Marland Kitchen CINNAMON PO Take 1 capsule by mouth 2 (two) times daily.    Marland Kitchen gabapentin (NEURONTIN) 100 MG capsule Take 100 mg by mouth 2 (two) times daily.    Marland Kitchen glimepiride (AMARYL) 4 MG tablet Take 4 mg by mouth daily before breakfast.    . HYDROcodone-acetaminophen (NORCO/VICODIN) 5-325 MG per tablet Take 1-2 tablets by mouth every 6 (six) hours as needed for moderate pain or severe pain. (Patient not taking: Reported on 01/23/2015) 15 tablet 0  . hydrocortisone  2.5 % cream APPLY TOPICALLY TO AFFECTED AREA TO RASH  QD PRN  1  . hydroxyurea (HYDREA) 500 MG capsule Take 1 capsule (500 mg total) by mouth daily. May take with food to minimize GI side effects. 90 capsule 0  . losartan (COZAAR) 100 MG tablet Take 100 mg by mouth daily.    . metoprolol (LOPRESSOR) 50 MG tablet TAKE 1 TABLET BY MOUTH TWICE DAILY 180 tablet 3  . Multiple Vitamin (MULTIVITAMIN WITH MINERALS) TABS tablet Take 1 tablet by mouth every morning.    . Omega-3 Fatty Acids (FISH OIL) 1200 MG CAPS Take 1 capsule by mouth 2 (two) times daily.    Marland Kitchen omeprazole (PRILOSEC) 20 MG capsule Take 20 mg by mouth daily.    . simvastatin (ZOCOR) 40 MG tablet Take 40 mg by mouth every evening.     No current facility-administered medications for this visit.    SURGICAL HISTORY:  Past Surgical History  Procedure Laterality Date  . Eye surgery    . Cardiac surgery    . Doppler echocardiography  06/18/2010    EF =>55%,LV normal  . Nm myocar perf wall motion  05/23/2008    EF 62% ,norm myocardial perfusion  . Cardiac catheterization  11/01/2005    patent stents with normal LV function  . Coronary angioplasty      post LAD and RCA stenting  . Cardiac catheterization  11/08/2003    cypher stent mid dominant right coronary lesion  . Holter monitor  11/08/2005    REVIEW OF SYSTEMS:  A comprehensive review of systems was negative.   PHYSICAL EXAMINATION: General appearance: alert, cooperative, fatigued and no distress Head: Normocephalic, without obvious abnormality, atraumatic Neck: no adenopathy, no JVD, supple, symmetrical, trachea midline and thyroid not enlarged, symmetric, no tenderness/mass/nodules Lymph nodes: Cervical, supraclavicular, and axillary nodes normal. Resp: clear to auscultation bilaterally Back: symmetric, no curvature. ROM normal. No CVA tenderness. Cardio: regular rate and rhythm, S1, S2 normal, no murmur, click, rub or gallop GI: soft, non-tender; bowel sounds normal; no  masses,  no organomegaly Extremities: extremities normal, atraumatic, no cyanosis or edema Neurologic: Alert and oriented X 3, normal strength and tone. Normal symmetric reflexes. Normal coordination and gait  ECOG PERFORMANCE STATUS: 1 - Symptomatic but completely ambulatory  Blood pressure 124/64, pulse 62, temperature 98.1 F (36.7 C), temperature source Oral, resp. rate 18, height 6\' 1"  (1.854 m), weight 211 lb 8 oz (95.936 kg), SpO2 98 %.  LABORATORY DATA: Lab Results  Component Value Date   WBC 10.1 04/25/2015   HGB 13.2 04/25/2015   HCT 42.7 04/25/2015   MCV 85.7 04/25/2015   PLT 459* 04/25/2015      Chemistry      Component Value Date/Time   NA 136 01/23/2015 0924   NA 136 06/19/2012 0530   K 4.4 01/23/2015 0924   K 3.9 06/19/2012 0530   CL 102 06/19/2012 0530   CO2 26 01/23/2015 0924   CO2 25 06/19/2012 0530   BUN 16.5 01/23/2015 0924   BUN 20 06/19/2012 0530   CREATININE 1.3 01/23/2015 0924   CREATININE 1.25 06/19/2012 0530      Component Value Date/Time   CALCIUM 9.4 01/23/2015 0924   CALCIUM 9.2 06/19/2012 0530   ALKPHOS 76 01/23/2015 0924   ALKPHOS 46 06/17/2012 1318   AST 16 01/23/2015 0924   AST 19 06/17/2012 1318   ALT 12 01/23/2015 0924   ALT 16 06/17/2012 1318   BILITOT 0.49 01/23/2015 0924   BILITOT 0.2* 06/17/2012 1318       RADIOGRAPHIC STUDIES: No results found.  ASSESSMENT AND PLAN: This is a very pleasant 82 years old white male with history of myeloproliferative disorders, polycythemia vera with positive JAK 2 mutation. He is currently on hydroxyurea 500 mg by mouth daily and tolerating it fairly well. His CBC showed increase in the platelets count up to 459,000. I recommended for the patient to continue with his current treatment with hydroxyurea 500 mg by mouth daily.  I would see him back for follow-up visit in 3 months with repeat CBC, comprehensive metabolic panel and LDH. He was advised to call immediately if he has any concerning  symptoms in the interval. The patient voices understanding of current disease status and treatment options and is in agreement with the current care plan.  All questions were answered. The patient knows to call the clinic with any problems, questions or concerns. We can certainly see the patient much sooner if necessary.  Disclaimer: This note was dictated with voice recognition software. Similar sounding words can inadvertently be transcribed and may not be corrected upon review.

## 2015-04-25 NOTE — Telephone Encounter (Signed)
GAVE AND PRINTED APPT SCHED FOR JULY

## 2015-05-08 ENCOUNTER — Other Ambulatory Visit: Payer: Self-pay | Admitting: *Deleted

## 2015-05-08 MED ORDER — HYDROXYUREA 500 MG PO CAPS: 500.0000 mg | ORAL_CAPSULE | Freq: Every day | ORAL | Status: DC

## 2015-06-01 DIAGNOSIS — E119 Type 2 diabetes mellitus without complications: Secondary | ICD-10-CM | POA: Diagnosis not present

## 2015-06-01 DIAGNOSIS — Z961 Presence of intraocular lens: Secondary | ICD-10-CM | POA: Diagnosis not present

## 2015-07-13 ENCOUNTER — Telehealth: Payer: Self-pay | Admitting: Internal Medicine

## 2015-07-13 NOTE — Telephone Encounter (Signed)
S/w pt, advised appt 7/11 chgd to 7/31 @ 9am due to md pal. Pt verbalized understanding.

## 2015-07-18 ENCOUNTER — Other Ambulatory Visit: Payer: Self-pay

## 2015-07-18 ENCOUNTER — Ambulatory Visit: Payer: Self-pay | Admitting: Internal Medicine

## 2015-08-07 ENCOUNTER — Ambulatory Visit (HOSPITAL_BASED_OUTPATIENT_CLINIC_OR_DEPARTMENT_OTHER): Payer: PPO | Admitting: Internal Medicine

## 2015-08-07 ENCOUNTER — Other Ambulatory Visit (HOSPITAL_BASED_OUTPATIENT_CLINIC_OR_DEPARTMENT_OTHER): Payer: PPO

## 2015-08-07 ENCOUNTER — Telehealth: Payer: Self-pay | Admitting: Internal Medicine

## 2015-08-07 ENCOUNTER — Encounter: Payer: Self-pay | Admitting: Internal Medicine

## 2015-08-07 VITALS — BP 113/58 | HR 58 | Temp 97.9°F | Resp 17 | Ht 73.0 in | Wt 211.7 lb

## 2015-08-07 DIAGNOSIS — D471 Chronic myeloproliferative disease: Secondary | ICD-10-CM

## 2015-08-07 DIAGNOSIS — D47Z9 Other specified neoplasms of uncertain behavior of lymphoid, hematopoietic and related tissue: Secondary | ICD-10-CM | POA: Diagnosis not present

## 2015-08-07 DIAGNOSIS — D45 Polycythemia vera: Secondary | ICD-10-CM | POA: Diagnosis not present

## 2015-08-07 DIAGNOSIS — D473 Essential (hemorrhagic) thrombocythemia: Secondary | ICD-10-CM

## 2015-08-07 DIAGNOSIS — D75839 Thrombocytosis, unspecified: Secondary | ICD-10-CM

## 2015-08-07 LAB — COMPREHENSIVE METABOLIC PANEL
ALT: 14 U/L (ref 0–55)
ANION GAP: 8 meq/L (ref 3–11)
AST: 16 U/L (ref 5–34)
Albumin: 3.4 g/dL — ABNORMAL LOW (ref 3.5–5.0)
Alkaline Phosphatase: 69 U/L (ref 40–150)
BUN: 14.4 mg/dL (ref 7.0–26.0)
CALCIUM: 9.4 mg/dL (ref 8.4–10.4)
CHLORIDE: 105 meq/L (ref 98–109)
CO2: 25 meq/L (ref 22–29)
Creatinine: 1.3 mg/dL (ref 0.7–1.3)
EGFR: 52 mL/min/{1.73_m2} — ABNORMAL LOW (ref >90)
Glucose: 121 mg/dl (ref 70–140)
POTASSIUM: 4.6 meq/L (ref 3.5–5.1)
Sodium: 138 mEq/L (ref 136–145)
Total Bilirubin: 0.54 mg/dL (ref 0.20–1.20)
Total Protein: 7.8 g/dL (ref 6.4–8.3)

## 2015-08-07 LAB — CBC WITH DIFFERENTIAL/PLATELET
BASO%: 1.9 % (ref 0.0–2.0)
Basophils Absolute: 0.2 10*3/uL — ABNORMAL HIGH (ref 0.0–0.1)
EOS ABS: 0.2 10*3/uL (ref 0.0–0.5)
EOS%: 2.1 % (ref 0.0–7.0)
HCT: 43.9 % (ref 38.4–49.9)
HGB: 14.1 g/dL (ref 13.0–17.1)
LYMPH%: 16.4 % (ref 14.0–49.0)
MCH: 26.9 pg — ABNORMAL LOW (ref 27.2–33.4)
MCHC: 32.1 g/dL (ref 32.0–36.0)
MCV: 84.1 fL (ref 79.3–98.0)
MONO#: 1.2 10*3/uL — AB (ref 0.1–0.9)
MONO%: 12.4 % (ref 0.0–14.0)
NEUT#: 6.4 10*3/uL (ref 1.5–6.5)
NEUT%: 67.2 % (ref 39.0–75.0)
PLATELETS: 430 10*3/uL — AB (ref 140–400)
RBC: 5.22 10*6/uL (ref 4.20–5.82)
RDW: 15.5 % — ABNORMAL HIGH (ref 11.0–14.6)
WBC: 9.5 10*3/uL (ref 4.0–10.3)
lymph#: 1.6 10*3/uL (ref 0.9–3.3)

## 2015-08-07 LAB — LACTATE DEHYDROGENASE: LDH: 146 U/L (ref 125–245)

## 2015-08-07 MED ORDER — HYDROXYUREA 500 MG PO CAPS: 500.0000 mg | ORAL_CAPSULE | Freq: Every day | ORAL | 1 refills | Status: DC

## 2015-08-07 NOTE — Telephone Encounter (Signed)
Gave pt cal & avs °

## 2015-08-07 NOTE — Progress Notes (Signed)
[place] Telephone:([phone]   Fax:(336) [place], [place] [address] [place] [address]  DIAGNOSIS: Myeloproliferative disorder suspicious for polycythemia vera with positive JAK- 2 mutation.  PRIOR THERAPY: Phlebotomy on as-needed basis.   CURRENT THERAPY: Hydroxyurea 500 mg by mouth daily.   INTERVAL HISTORY: [name] 82 y.o. male returns to the clinic today for three-month follow-up visit. The patient is feeling fine today with no specific complaints. He is tolerating his treatment with Hydrea fairly well with no significant adverse effects. He denied having any significant nausea, vomiting or diarrhea. He has no fever or chills. He denied having any weight loss or night sweats. He has no chest pain, shortness of breath, cough or hemoptysis. The patient has repeat CBC and comprehensive metabolic panel performed earlier today and he is here for evaluation and discussion of his lab results.   MEDICAL HISTORY: Past Medical History:  Diagnosis Date  . Chest pain   . Coronary artery disease   . Diabetes mellitus without complication ([place])   . Hyperlipidemia   . Hypertension     ALLERGIES:  has No Known Allergies.  MEDICATIONS:  Current Outpatient Prescriptions  Medication Sig Dispense Refill  . acetaminophen (TYLENOL) 325 MG tablet Take 650 mg by mouth every 6 (six) hours as needed for moderate pain. Reported on 01/23/2015    . aspirin 325 MG tablet Take 325 mg by mouth every morning.     Marland Kitchen CINNAMON PO Take 1 capsule by mouth 2 (two) times daily.    Marland Kitchen gabapentin (NEURONTIN) 100 MG capsule Take 100 mg by mouth 2 (two) times daily.    Marland Kitchen glimepiride (AMARYL) 4 MG tablet Take 4 mg by mouth daily before breakfast.    . HYDROcodone-acetaminophen (NORCO/VICODIN) 5-325 MG per tablet Take 1-2 tablets by mouth every 6 (six) hours as needed for moderate pain or severe pain. 15 tablet 0  . hydrocortisone 2.5 %  cream APPLY TOPICALLY TO AFFECTED AREA TO RASH  QD PRN  1  . hydroxyurea (HYDREA) 500 MG capsule Take 1 capsule (500 mg total) by mouth daily. May take with food to minimize GI side effects. 90 capsule 0  . losartan (COZAAR) 100 MG tablet Take 100 mg by mouth daily.    . metoprolol (LOPRESSOR) 50 MG tablet TAKE 1 TABLET BY MOUTH TWICE DAILY 180 tablet 3  . Multiple Vitamin (MULTIVITAMIN WITH MINERALS) TABS tablet Take 1 tablet by mouth every morning.    . Omega-3 Fatty Acids (FISH OIL) 1200 MG CAPS Take 1 capsule by mouth 2 (two) times daily.    Marland Kitchen omeprazole (PRILOSEC) 20 MG capsule Take 20 mg by mouth daily.    . simvastatin (ZOCOR) 40 MG tablet Take 40 mg by mouth every evening.     No current facility-administered medications for this visit.     SURGICAL HISTORY:  Past Surgical History:  Procedure Laterality Date  . CARDIAC CATHETERIZATION  11/01/2005   patent stents with normal LV function  . CARDIAC CATHETERIZATION  11/08/2003   cypher stent mid dominant right coronary lesion  . CARDIAC SURGERY    . CORONARY ANGIOPLASTY     post LAD and RCA stenting  . DOPPLER ECHOCARDIOGRAPHY  06/18/2010   EF =>55%,LV normal  . EYE SURGERY    . holter monitor  11/08/2005  . NM MYOCAR PERF WALL MOTION  05/23/2008   EF 62% ,norm myocardial perfusion  REVIEW OF SYSTEMS:  A comprehensive review of systems was negative.   PHYSICAL EXAMINATION: General appearance: alert, cooperative, fatigued and no distress Head: Normocephalic, without obvious abnormality, atraumatic Neck: no adenopathy, no JVD, supple, symmetrical, trachea midline and thyroid not enlarged, symmetric, no tenderness/mass/nodules Lymph nodes: Cervical, supraclavicular, and axillary nodes normal. Resp: clear to auscultation bilaterally Back: symmetric, no curvature. ROM normal. No CVA tenderness. Cardio: regular rate and rhythm, S1, S2 normal, no murmur, click, rub or gallop GI: soft, non-tender; bowel sounds normal; no masses,   no organomegaly Extremities: extremities normal, atraumatic, no cyanosis or edema Neurologic: Alert and oriented X 3, normal strength and tone. Normal symmetric reflexes. Normal coordination and gait  ECOG PERFORMANCE STATUS: 1 - Symptomatic but completely ambulatory  Blood pressure (!) 113/58, pulse (!) 58, temperature 97.9 F (36.6 C), temperature source Oral, resp. rate 17, height 6\' 1"  (1.854 m), weight 211 lb 11.2 oz (96 kg), SpO2 98 %.  LABORATORY DATA: Lab Results  Component Value Date   WBC 9.5 08/07/2015   HGB 14.1 08/07/2015   HCT 43.9 08/07/2015   MCV 84.1 08/07/2015   PLT 430 (H) 08/07/2015      Chemistry      Component Value Date/Time   NA 139 04/25/2015 0849   K 4.9 04/25/2015 0849   CL 102 06/19/2012 0530   CO2 26 04/25/2015 0849   BUN 16.1 04/25/2015 0849   CREATININE 1.3 04/25/2015 0849      Component Value Date/Time   CALCIUM 9.4 04/25/2015 0849   ALKPHOS 76 04/25/2015 0849   AST 18 04/25/2015 0849   ALT 10 04/25/2015 0849   BILITOT 0.46 04/25/2015 0849       RADIOGRAPHIC STUDIES: No results found.  ASSESSMENT AND PLAN: This is a very pleasant 82 years old white male with history of myeloproliferative disorders, polycythemia vera with positive JAK 2 mutation. He is currently on hydroxyurea 500 mg by mouth daily and tolerating it fairly well. His CBC showed a platelets count of 430,000. I recommended for the patient to continue with his current treatment with hydroxyurea 500 mg by mouth daily. I will send refill of his medication to his pharmacy. I would see him back for follow-up visit in 3 months with repeat CBC, comprehensive metabolic panel and LDH. He was advised to call immediately if he has any concerning symptoms in the interval. The patient voices understanding of current disease status and treatment options and is in agreement with the current care plan.  All questions were answered. The patient knows to call the clinic with any problems,  questions or concerns. We can certainly see the patient much sooner if necessary.  Disclaimer: This note was dictated with voice recognition software. Similar sounding words can inadvertently be transcribed and may not be corrected upon review.

## 2015-10-10 DIAGNOSIS — E119 Type 2 diabetes mellitus without complications: Secondary | ICD-10-CM | POA: Diagnosis not present

## 2015-10-10 DIAGNOSIS — E039 Hypothyroidism, unspecified: Secondary | ICD-10-CM | POA: Diagnosis not present

## 2015-10-10 DIAGNOSIS — I10 Essential (primary) hypertension: Secondary | ICD-10-CM | POA: Diagnosis not present

## 2015-11-07 ENCOUNTER — Ambulatory Visit (HOSPITAL_BASED_OUTPATIENT_CLINIC_OR_DEPARTMENT_OTHER): Payer: PPO | Admitting: Internal Medicine

## 2015-11-07 ENCOUNTER — Telehealth: Payer: Self-pay | Admitting: Internal Medicine

## 2015-11-07 ENCOUNTER — Encounter: Payer: Self-pay | Admitting: Internal Medicine

## 2015-11-07 ENCOUNTER — Other Ambulatory Visit (HOSPITAL_BASED_OUTPATIENT_CLINIC_OR_DEPARTMENT_OTHER): Payer: PPO

## 2015-11-07 VITALS — BP 119/61 | HR 56 | Temp 97.9°F | Resp 17 | Ht 73.0 in | Wt 214.5 lb

## 2015-11-07 DIAGNOSIS — D75839 Thrombocytosis, unspecified: Secondary | ICD-10-CM

## 2015-11-07 DIAGNOSIS — D473 Essential (hemorrhagic) thrombocythemia: Secondary | ICD-10-CM

## 2015-11-07 DIAGNOSIS — D45 Polycythemia vera: Secondary | ICD-10-CM | POA: Diagnosis not present

## 2015-11-07 DIAGNOSIS — D471 Chronic myeloproliferative disease: Secondary | ICD-10-CM

## 2015-11-07 DIAGNOSIS — D47Z9 Other specified neoplasms of uncertain behavior of lymphoid, hematopoietic and related tissue: Secondary | ICD-10-CM

## 2015-11-07 LAB — COMPREHENSIVE METABOLIC PANEL
ALBUMIN: 3.5 g/dL (ref 3.5–5.0)
ALK PHOS: 70 U/L (ref 40–150)
ALT: 16 U/L (ref 0–55)
AST: 17 U/L (ref 5–34)
Anion Gap: 9 mEq/L (ref 3–11)
BILIRUBIN TOTAL: 0.5 mg/dL (ref 0.20–1.20)
BUN: 14.2 mg/dL (ref 7.0–26.0)
CALCIUM: 9.6 mg/dL (ref 8.4–10.4)
CO2: 26 mEq/L (ref 22–29)
CREATININE: 1.2 mg/dL (ref 0.7–1.3)
Chloride: 104 mEq/L (ref 98–109)
EGFR: 55 mL/min/{1.73_m2} — ABNORMAL LOW (ref >90)
GLUCOSE: 114 mg/dL (ref 70–140)
Potassium: 4.9 mEq/L (ref 3.5–5.1)
SODIUM: 139 meq/L (ref 136–145)
TOTAL PROTEIN: 7.9 g/dL (ref 6.4–8.3)

## 2015-11-07 LAB — CBC WITH DIFFERENTIAL/PLATELET
BASO%: 1.6 % (ref 0.0–2.0)
BASOS ABS: 0.2 10*3/uL — AB (ref 0.0–0.1)
EOS%: 2 % (ref 0.0–7.0)
Eosinophils Absolute: 0.2 10*3/uL (ref 0.0–0.5)
HEMATOCRIT: 43.9 % (ref 38.4–49.9)
HEMOGLOBIN: 13.9 g/dL (ref 13.0–17.1)
LYMPH%: 16.4 % (ref 14.0–49.0)
MCH: 27.1 pg — ABNORMAL LOW (ref 27.2–33.4)
MCHC: 31.7 g/dL — ABNORMAL LOW (ref 32.0–36.0)
MCV: 85.6 fL (ref 79.3–98.0)
MONO#: 0.9 10*3/uL (ref 0.1–0.9)
MONO%: 10.3 % (ref 0.0–14.0)
NEUT%: 69.7 % (ref 39.0–75.0)
NEUTROS ABS: 6.4 10*3/uL (ref 1.5–6.5)
Platelets: 350 10*3/uL (ref 140–400)
RBC: 5.13 10*6/uL (ref 4.20–5.82)
RDW: 14.5 % (ref 11.0–14.6)
WBC: 9.1 10*3/uL (ref 4.0–10.3)
lymph#: 1.5 10*3/uL (ref 0.9–3.3)

## 2015-11-07 LAB — LACTATE DEHYDROGENASE: LDH: 160 U/L (ref 125–245)

## 2015-11-07 NOTE — Telephone Encounter (Signed)
AVS report and appointment schedule given to patient, per 11/07/15 los.

## 2015-11-07 NOTE — Progress Notes (Signed)
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  PRIOR THERAPY: Phlebotomy on as-needed basis.   CURRENT THERAPY: Hydroxyurea 500 mg by mouth daily.   INTERVAL HISTORY: [name] 82 y.o. male returns to the clinic today for three-month follow-up visit. The patient is feeling fine today with no specific complaints. The patient continues to do fine with no change since his last visit. He is tolerating his treatment with Hydrea fairly well with no significant adverse effects. He denied having any significant nausea, vomiting or diarrhea. He has no fever or chills. He denied having any weight loss or night sweats. He has no chest pain, shortness of breath, cough or hemoptysis. The patient has repeat CBC and comprehensive metabolic panel performed earlier today and he is here for evaluation and discussion of his lab results.   MEDICAL HISTORY: Past Medical History:  Diagnosis Date  . Chest pain   . Coronary artery disease   . Diabetes mellitus without complication ([place])   . Hyperlipidemia   . Hypertension     ALLERGIES:  has No Known Allergies.  MEDICATIONS:  Current Outpatient Prescriptions  Medication Sig Dispense Refill  . acetaminophen (TYLENOL) 325 MG tablet Take 650 mg by mouth every 6 (six) hours as needed for moderate pain. Reported on 01/23/2015    . aspirin 325 MG tablet Take 325 mg by mouth every morning.     Marland Kitchen CINNAMON PO Take 1 capsule by mouth 2 (two) times daily.    Marland Kitchen gabapentin (NEURONTIN) 100 MG capsule Take 100 mg by mouth 2 (two) times daily.    Marland Kitchen glimepiride (AMARYL) 4 MG tablet Take 4 mg by mouth daily before breakfast.    . hydrocortisone 2.5 % cream APPLY TOPICALLY TO AFFECTED AREA TO RASH  QD PRN  1  . hydroxyurea (HYDREA) 500 MG capsule  Take 1 capsule (500 mg total) by mouth daily. May take with food to minimize GI side effects. 90 capsule 1  . losartan (COZAAR) 100 MG tablet Take 100 mg by mouth daily.    . metoprolol (LOPRESSOR) 50 MG tablet TAKE 1 TABLET BY MOUTH TWICE DAILY 180 tablet 3  . Multiple Vitamin (MULTIVITAMIN WITH MINERALS) TABS tablet Take 1 tablet by mouth every morning.    . Omega-3 Fatty Acids (FISH OIL) 1200 MG CAPS Take 1 capsule by mouth 2 (two) times daily.    Marland Kitchen omeprazole (PRILOSEC) 20 MG capsule Take 20 mg by mouth daily.    . simvastatin (ZOCOR) 40 MG tablet Take 40 mg by mouth every evening.    Marland Kitchen HYDROcodone-acetaminophen (NORCO/VICODIN) 5-325 MG per tablet Take 1-2 tablets by mouth every 6 (six) hours as needed for moderate pain or severe pain. (Patient not taking: Reported on 11/07/2015) 15 tablet 0   No current facility-administered medications for this visit.     SURGICAL HISTORY:  Past Surgical History:  Procedure Laterality Date  . CARDIAC CATHETERIZATION  11/01/2005   patent stents with normal LV function  . CARDIAC CATHETERIZATION  11/08/2003   cypher stent mid dominant right coronary lesion  . CARDIAC SURGERY    . CORONARY ANGIOPLASTY     post LAD and RCA stenting  . DOPPLER ECHOCARDIOGRAPHY  06/18/2010   EF =>55%,LV normal  . EYE SURGERY    . holter monitor  11/08/2005  . NM MYOCAR PERF WALL MOTION  05/23/2008   EF 62% ,norm myocardial perfusion    REVIEW OF SYSTEMS:  A comprehensive review of systems was negative.   PHYSICAL EXAMINATION: General appearance: alert, cooperative, fatigued and no distress Head: Normocephalic, without obvious abnormality, atraumatic Neck: no adenopathy, no JVD, supple, symmetrical, trachea midline and thyroid not enlarged, symmetric, no tenderness/mass/nodules Lymph nodes: Cervical, supraclavicular, and axillary nodes normal. Resp: clear to auscultation bilaterally Back: symmetric, no curvature. ROM normal. No CVA tenderness. Cardio: regular rate  and rhythm, S1, S2 normal, no murmur, click, rub or gallop GI: soft, non-tender; bowel sounds normal; no masses,  no organomegaly Extremities: extremities normal, atraumatic, no cyanosis or edema Neurologic: Alert and oriented X 3, normal strength and tone. Normal symmetric reflexes. Normal coordination and gait  ECOG PERFORMANCE STATUS: 1 - Symptomatic but completely ambulatory  Blood pressure 119/61, pulse (!) 56, temperature 97.9 F (36.6 C), temperature source Oral, resp. rate 17, height 6\' 1"  (1.854 m), weight 214 lb 8 oz (97.3 kg), SpO2 98 %.  LABORATORY DATA: Lab Results  Component Value Date   WBC 9.1 11/07/2015   HGB 13.9 11/07/2015   HCT 43.9 11/07/2015   MCV 85.6 11/07/2015   PLT 350 11/07/2015      Chemistry      Component Value Date/Time   NA 139 11/07/2015 1019   K 4.9 11/07/2015 1019   CL 102 06/19/2012 0530   CO2 26 11/07/2015 1019   BUN 14.2 11/07/2015 1019   CREATININE 1.2 11/07/2015 1019      Component Value Date/Time   CALCIUM 9.6 11/07/2015 1019   ALKPHOS 70 11/07/2015 1019   AST 17 11/07/2015 1019   ALT 16 11/07/2015 1019   BILITOT 0.50 11/07/2015 1019       RADIOGRAPHIC STUDIES: No results found.  ASSESSMENT AND PLAN: This is a very pleasant 82 years old white male with history of myeloproliferative disorders, polycythemia vera with positive JAK 2 mutation. He is currently on hydroxyurea 500 mg by mouth daily and tolerating it fairly well. His CBC showed a platelets count of 350,000. I recommended for the patient to continue with his current treatment with hydroxyurea 500 mg by mouth daily. I would see him back for follow-up visit in 6 months with repeat CBC, comprehensive metabolic panel and LDH. He was advised to call immediately if he has any concerning symptoms in the interval. The patient voices understanding of current disease status and treatment options and is in agreement with the current care plan.  All questions were answered. The  patient knows to call the clinic with any problems, questions or concerns. We can certainly see the patient much sooner if necessary.  Disclaimer: This note was dictated with voice recognition software. Similar sounding words can inadvertently be transcribed and may not be corrected upon review.

## 2015-12-10 ENCOUNTER — Other Ambulatory Visit: Payer: Self-pay | Admitting: Cardiovascular Disease

## 2015-12-11 ENCOUNTER — Other Ambulatory Visit: Payer: Self-pay | Admitting: Cardiovascular Disease

## 2015-12-12 NOTE — Telephone Encounter (Signed)
Rx(s) sent to pharmacy electronically.  

## 2015-12-26 ENCOUNTER — Other Ambulatory Visit: Payer: Self-pay | Admitting: *Deleted

## 2015-12-26 ENCOUNTER — Other Ambulatory Visit: Payer: Self-pay | Admitting: Cardiovascular Disease

## 2015-12-26 MED ORDER — METOPROLOL TARTRATE 50 MG PO TABS: 50.0000 mg | ORAL_TABLET | Freq: Two times a day (BID) | ORAL | 0 refills | Status: DC

## 2015-12-26 NOTE — Telephone Encounter (Signed)
Per call from patient, the pharmacy only dispensed #[phone] when he last had this refilled.

## 2016-01-10 ENCOUNTER — Ambulatory Visit (INDEPENDENT_AMBULATORY_CARE_PROVIDER_SITE_OTHER): Payer: PPO | Admitting: Cardiovascular Disease

## 2016-01-10 ENCOUNTER — Telehealth: Payer: Self-pay | Admitting: Cardiovascular Disease

## 2016-01-10 ENCOUNTER — Encounter: Payer: Self-pay | Admitting: Cardiovascular Disease

## 2016-01-10 VITALS — BP 124/70 | HR 54 | Ht 72.0 in | Wt 215.4 lb

## 2016-01-10 DIAGNOSIS — I2510 Atherosclerotic heart disease of native coronary artery without angina pectoris: Secondary | ICD-10-CM

## 2016-01-10 MED ORDER — METOPROLOL TARTRATE 50 MG PO TABS: 50.0000 mg | ORAL_TABLET | Freq: Two times a day (BID) | ORAL | 0 refills | Status: DC

## 2016-01-10 MED ORDER — METOPROLOL TARTRATE 25 MG PO TABS: 50.0000 mg | ORAL_TABLET | Freq: Two times a day (BID) | ORAL | 3 refills | Status: DC

## 2016-01-10 NOTE — Telephone Encounter (Signed)
New Message   *STAT* If patient is at the pharmacy, call can be transferred to refill team.   1. Which medications need to be refilled? (please list name of each medication and dose if known) metoprolol (Lopressor) 50 mg tablet, 25mg  twice daily totaling 50 mg tablets.  2. Which pharmacy/location (including street and city if local pharmacy) is medication to be sent to? [address], [place], [place] [address]  3. Do they need a 30 day or 90 day supply? 90 day supply

## 2016-01-10 NOTE — Assessment & Plan Note (Signed)
History of hypertension with blood pressure measured today at 124/70. He is on losartan and metoprolol. Continue current meds at current dosing

## 2016-01-10 NOTE — Assessment & Plan Note (Signed)
History of hyperlipidemia on statin therapy followed by his PCP 

## 2016-01-10 NOTE — Telephone Encounter (Signed)
No answer when dialed. Intended to verify tablet request from patient and to notify him there may be coverage limits by insurance. Rx w 25mg  tablets to equal dosing was sent to requested pharmacy.

## 2016-01-10 NOTE — Progress Notes (Signed)
01/10/2016 [place]   [birth year]  [id number]  Primary Physician [name], MD Primary Cardiologist: Lorretta Harp MD Lupe Carney, [place]  HPI:  The patient is a very pleasant 83 year old mildly overweight widowed Caucasian male (wife died 22-Sep-2010 at age 62), father of 81, grandfather to 6 grandchildren who I last saw in the office 12/06/14. He has a history of CAD status post LAD and RCA stenting in the past. He was last catheterized by Dr. Melvern Banker in 2007 and found to have patent stents with normal LV function. His other problems include hypertension, hyperlipidemia and diabetes. He does not smoke or drink.Since I saw him 11/26/13 he has been relatively asymptomatic. He had a Myoview stress test and 2-D echo performed in October 2014 year which were normal. He has also gotten engaged since I last saw him and remains engaged to this day. He complains of erectile dysfunction.   Current Outpatient Prescriptions  Medication Sig Dispense Refill  . acetaminophen (TYLENOL) 325 MG tablet Take 650 mg by mouth every 6 (six) hours as needed for moderate pain. Reported on 01/23/2015    . aspirin 325 MG tablet Take 325 mg by mouth every morning.     Marland Kitchen CINNAMON PO Take 1 capsule by mouth 2 (two) times daily.    Marland Kitchen gabapentin (NEURONTIN) 100 MG capsule Take 100 mg by mouth 2 (two) times daily.    Marland Kitchen glimepiride (AMARYL) 4 MG tablet Take 4 mg by mouth daily before breakfast.    . HYDROcodone-acetaminophen (NORCO/VICODIN) 5-325 MG per tablet Take 1-2 tablets by mouth every 6 (six) hours as needed for moderate pain or severe pain. 15 tablet 0  . hydrocortisone 2.5 % cream APPLY TOPICALLY TO AFFECTED AREA TO RASH  QD PRN  1  . hydroxyurea (HYDREA) 500 MG capsule Take 1 capsule (500 mg total) by mouth daily. May take with food to minimize GI side effects. 90 capsule 1  . losartan (COZAAR) 100 MG tablet Take 100 mg by mouth daily.    . metoprolol (LOPRESSOR) 50 MG tablet Take 1 tablet (50 mg  total) by mouth 2 (two) times daily. 90 tablet 0  . Multiple Vitamin (MULTIVITAMIN WITH MINERALS) TABS tablet Take 1 tablet by mouth every morning.    . Omega-3 Fatty Acids (FISH OIL) 1200 MG CAPS Take 1 capsule by mouth 2 (two) times daily.    Marland Kitchen omeprazole (PRILOSEC) 20 MG capsule Take 20 mg by mouth daily.    . simvastatin (ZOCOR) 40 MG tablet Take 40 mg by mouth every evening.     No current facility-administered medications for this visit.     No Known Allergies  Social History   Social History  . Marital status: Married    Spouse name: N/A  . Number of children: 4  . Years of education: 12   Occupational History  . Not on file.   Social History Main Topics  . Smoking status: Former Smoker    Types: Cigarettes  . Smokeless tobacco: Former Systems developer    Quit date: 10/31/1982  . Alcohol use Yes     Comment: 1 glass wine daily  . Drug use: No  . Sexual activity: No   Other Topics Concern  . Not on file   Social History Narrative  . No narrative on file     Review of Systems: General: negative for chills, fever, night sweats or weight changes.  Cardiovascular: negative for chest pain, dyspnea on exertion,  edema, orthopnea, palpitations, paroxysmal nocturnal dyspnea or shortness of breath Dermatological: negative for rash Respiratory: negative for cough or wheezing Urologic: negative for hematuria Abdominal: negative for nausea, vomiting, diarrhea, bright red blood per rectum, melena, or hematemesis Neurologic: negative for visual changes, syncope, or dizziness All other systems reviewed and are otherwise negative except as noted above.    Blood pressure 124/70, pulse (!) 54, height 6' (1.829 m), weight 215 lb 6.4 oz (97.7 kg).  General appearance: alert and no distress Neck: no adenopathy, no carotid bruit, no JVD, supple, symmetrical, trachea midline and thyroid not enlarged, symmetric, no tenderness/mass/nodules Lungs: clear to auscultation bilaterally Heart:  regular rate and rhythm, S1, S2 normal, no murmur, click, rub or gallop Extremities: extremities normal, atraumatic, no cyanosis or edema  EKG sinus bradycardia at 54 without ST or T-wave changes. I personally reviewed this EKG  ASSESSMENT AND PLAN:   HYPERLIPIDEMIA History of hyperlipidemia on statin therapy followed by his PCP  Essential hypertension History of hypertension with blood pressure measured today at 124/70. He is on losartan and metoprolol. Continue current meds at current dosing  Coronary atherosclerosis History of coronary artery disease status post LAD and RCA stenting in the past. He was last catheterized by Dr. Melvern Banker in 2007, have patent stents with normal LV function. He had a Myoview stress test and echo performed October 2014 which were normal. He denies chest pain or shortness of breath.      Lorretta Harp MD FACP,FACC,FAHA, [facility] 01/10/2016 12:06 PM

## 2016-01-10 NOTE — Patient Instructions (Signed)

## 2016-01-10 NOTE — Assessment & Plan Note (Signed)
History of coronary artery disease status post LAD and RCA stenting in the past. He was last catheterized by Dr. Melvern Banker in 2007, have patent stents with normal LV function. He had a Myoview stress test and echo performed October 2014 which were normal. He denies chest pain or shortness of breath.

## 2016-01-19 NOTE — Addendum Note (Signed)
Addended by: Therisa Doyne on: 01/19/2016 04:55 PM   Modules accepted: Orders

## 2016-01-30 ENCOUNTER — Telehealth: Payer: Self-pay | Admitting: *Deleted

## 2016-01-30 MED ORDER — HYDROXYUREA 500 MG PO CAPS: 500.0000 mg | ORAL_CAPSULE | Freq: Every day | ORAL | 1 refills | Status: DC

## 2016-01-30 NOTE — Telephone Encounter (Signed)
Pt To continue Hydrea 500 mg Daily. Rx refilled.

## 2016-03-16 IMAGING — CT CT HEAD W/O CM
2 series · 17 of 30 positions shown, 20 images · non-contrast
Comparison: Prior head CT 06/17/2012

CLINICAL DATA: 81-year-old male with headache status post falling

EXAM:
CT HEAD WITHOUT CONTRAST
TECHNIQUE: Contiguous axial images were obtained from the base of the skull
through the vertex without intravenous contrast.

[Series 2: head w/o · axial · non-contrast · 0.47mm/px · z∈[-187,-57]mm · 9 of 34 slices shown, 12 images]
[im 4/34  brain]
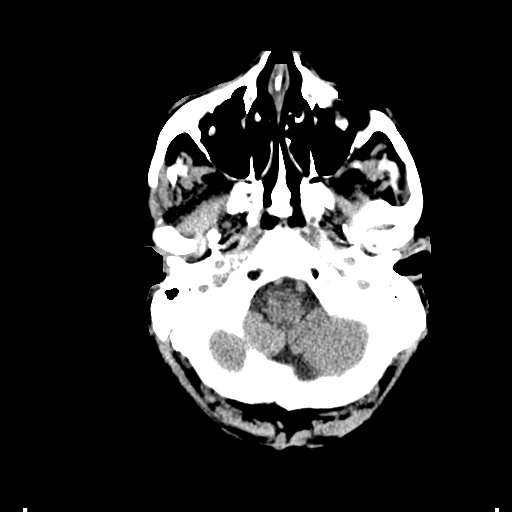
[im 4/34  bone]
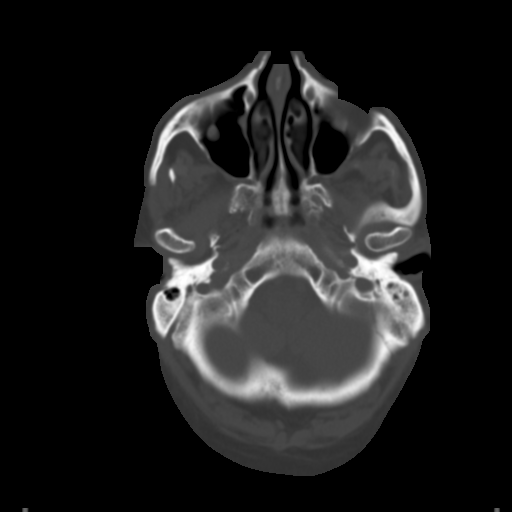
[im 7/34  brain]
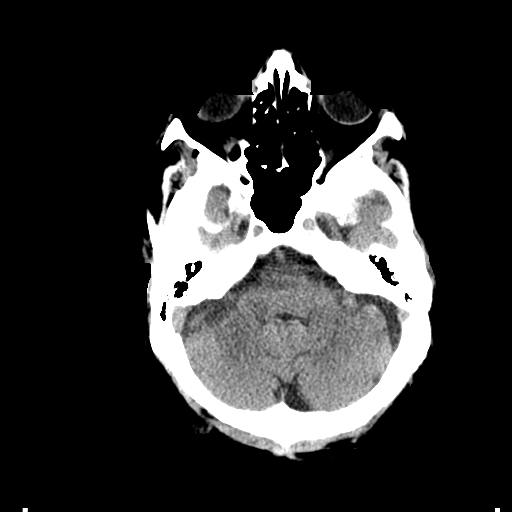
[im 10/34  brain]
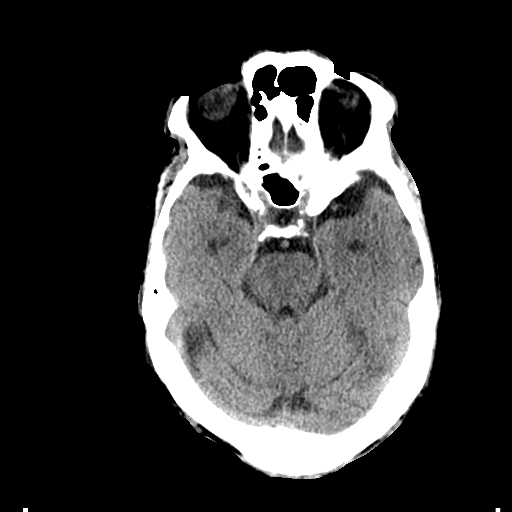
[im 14/34  brain]
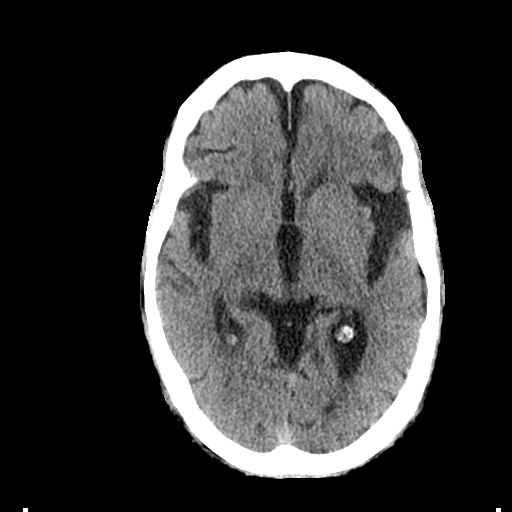
[im 17/34  brain]
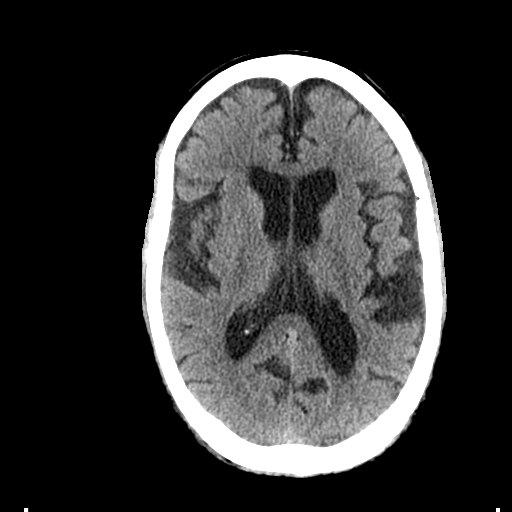
[im 17/34  bone]
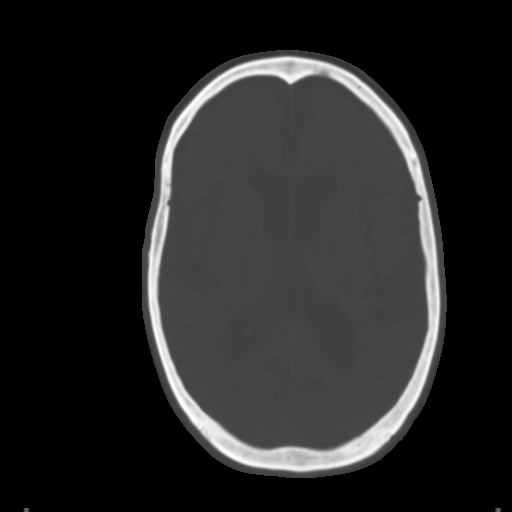
[im 20/34  brain]
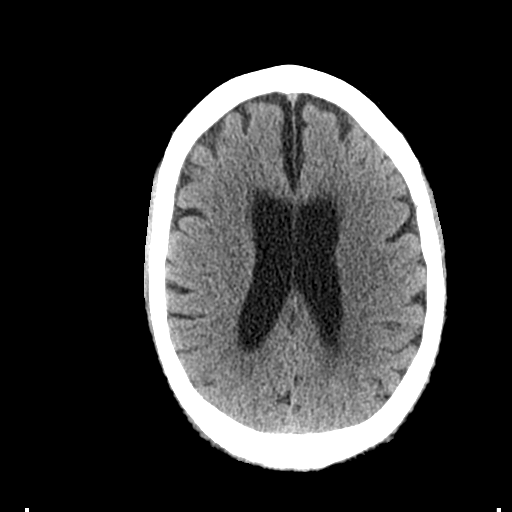
[im 24/34  brain]
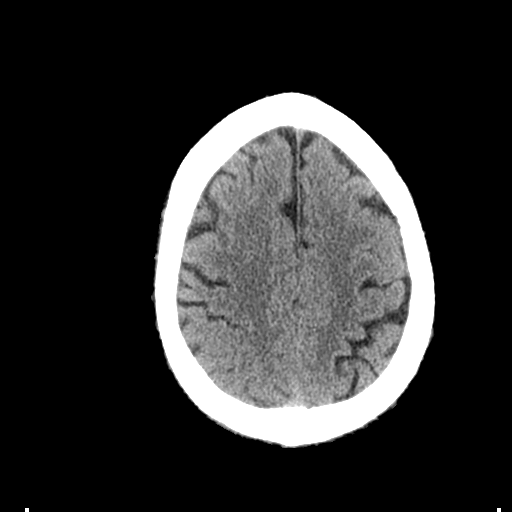
[im 27/34  brain]
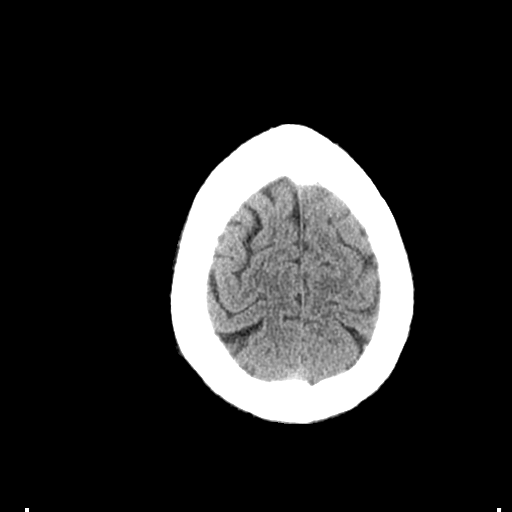
[im 30/34  brain]
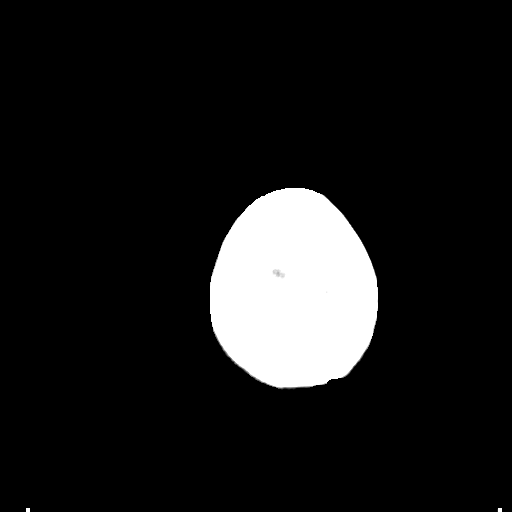
[im 30/34  bone]
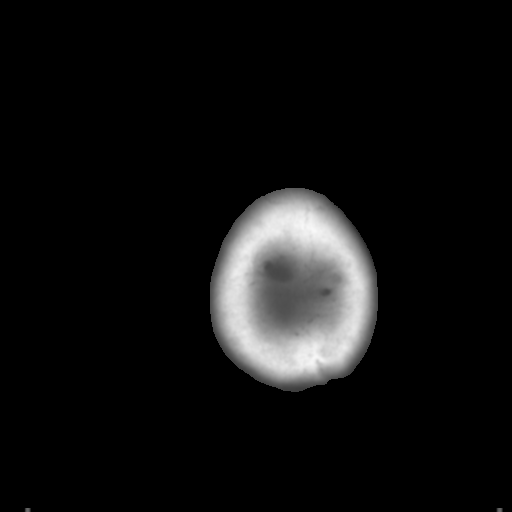

[Series 3: bone windows · axial · 0.47mm/px · z∈[-184,-58]mm · 8 of 56 slices shown]
[im 7/56  bone]
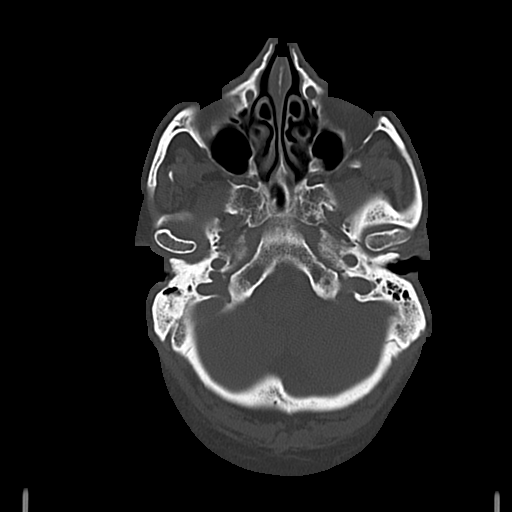
[im 13/56  bone]
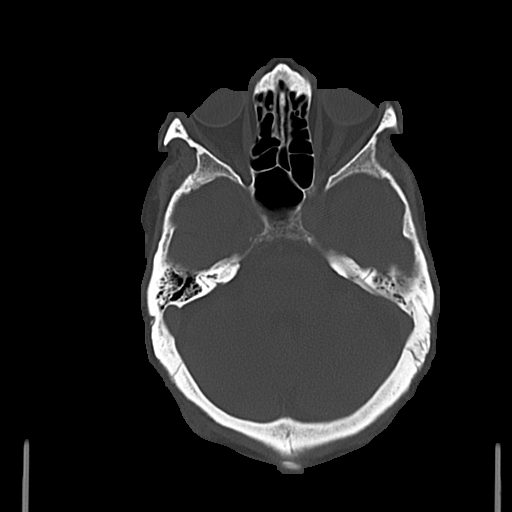
[im 19/56  bone]
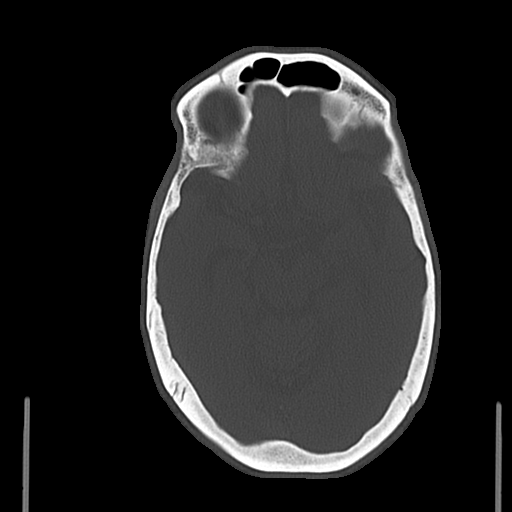
[im 25/56  bone]
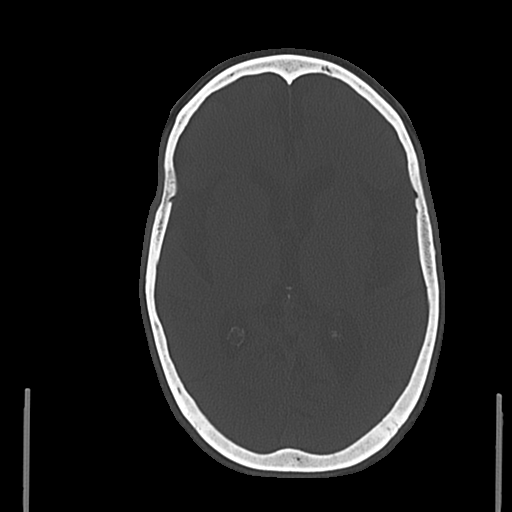
[im 31/56  bone]
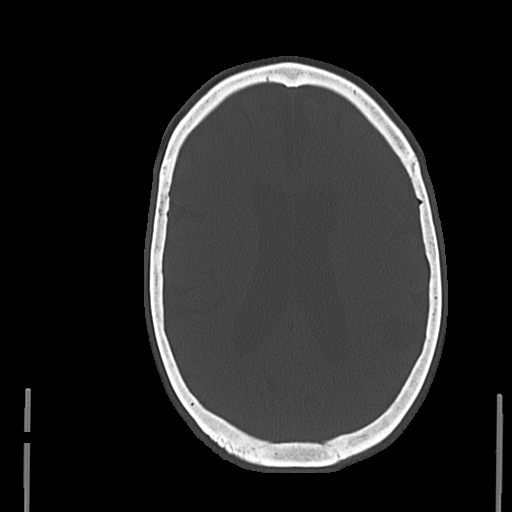
[im 37/56  bone]
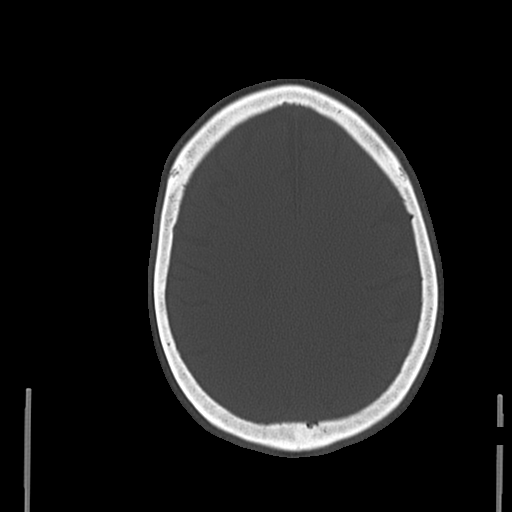
[im 43/56  bone]
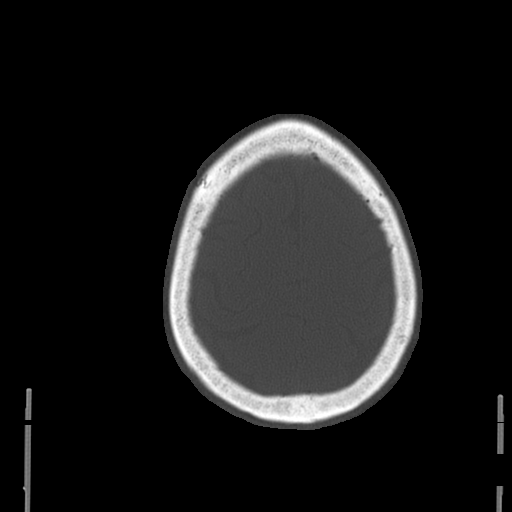
[im 49/56  bone]
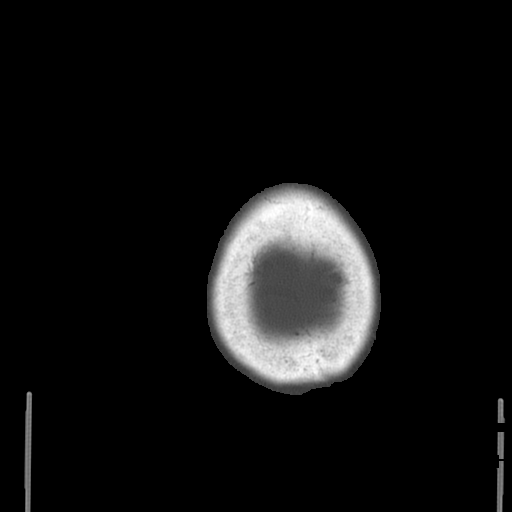

[17 of 30 positions shown; findings below may reference images not displayed]

FINDINGS: Negative for acute intracranial hemorrhage, acute infarction, mass,
mass effect, hydrocephalus or midline shift. Gray-white
differentiation is preserved throughout. Stable global cortical
atrophy and mild chronic microvascular ischemic white matter
disease. No focal scalp hematoma or calvarial abnormality. Globes
appear intact. Small right maxillary mucous retention cysts versus
polyps. Normal aeration of the mastoid air cells and the remainder
the paranasal sinuses.
IMPRESSION: 1. No acute intracranial abnormality.
2. Stable atrophy and chronic microvascular ischemic white matter
changes.

## 2016-03-21 DIAGNOSIS — R05 Cough: Secondary | ICD-10-CM | POA: Diagnosis not present

## 2016-03-23 DIAGNOSIS — J40 Bronchitis, not specified as acute or chronic: Secondary | ICD-10-CM | POA: Diagnosis not present

## 2016-04-29 ENCOUNTER — Other Ambulatory Visit: Payer: Self-pay | Admitting: Medical Oncology

## 2016-04-30 ENCOUNTER — Encounter: Payer: Self-pay | Admitting: Internal Medicine

## 2016-04-30 ENCOUNTER — Ambulatory Visit (HOSPITAL_BASED_OUTPATIENT_CLINIC_OR_DEPARTMENT_OTHER): Payer: PPO | Admitting: Internal Medicine

## 2016-04-30 ENCOUNTER — Telehealth: Payer: Self-pay | Admitting: Internal Medicine

## 2016-04-30 ENCOUNTER — Other Ambulatory Visit: Payer: PPO

## 2016-04-30 VITALS — BP 129/66 | HR 58 | Temp 98.4°F | Resp 18 | Ht 72.0 in | Wt 214.6 lb

## 2016-04-30 DIAGNOSIS — D471 Chronic myeloproliferative disease: Secondary | ICD-10-CM

## 2016-04-30 DIAGNOSIS — D45 Polycythemia vera: Secondary | ICD-10-CM | POA: Diagnosis not present

## 2016-04-30 DIAGNOSIS — D47Z9 Other specified neoplasms of uncertain behavior of lymphoid, hematopoietic and related tissue: Secondary | ICD-10-CM | POA: Diagnosis not present

## 2016-04-30 LAB — CBC WITH DIFFERENTIAL/PLATELET
BASO%: 2 % (ref 0.0–2.0)
Basophils Absolute: 0.2 10*3/uL — ABNORMAL HIGH (ref 0.0–0.1)
EOS ABS: 0.2 10*3/uL (ref 0.0–0.5)
EOS%: 2.6 % (ref 0.0–7.0)
HEMATOCRIT: 46.2 % (ref 38.4–49.9)
HGB: 15 g/dL (ref 13.0–17.1)
LYMPH#: 1.5 10*3/uL (ref 0.9–3.3)
LYMPH%: 17.5 % (ref 14.0–49.0)
MCH: 27.3 pg (ref 27.2–33.4)
MCHC: 32.4 g/dL (ref 32.0–36.0)
MCV: 84.3 fL (ref 79.3–98.0)
MONO#: 1 10*3/uL — AB (ref 0.1–0.9)
MONO%: 11.2 % (ref 0.0–14.0)
NEUT#: 5.7 10*3/uL (ref 1.5–6.5)
NEUT%: 66.7 % (ref 39.0–75.0)
PLATELETS: 337 10*3/uL (ref 140–400)
RBC: 5.48 10*6/uL (ref 4.20–5.82)
RDW: 15.3 % — ABNORMAL HIGH (ref 11.0–14.6)
WBC: 8.6 10*3/uL (ref 4.0–10.3)

## 2016-04-30 LAB — COMPREHENSIVE METABOLIC PANEL
ALBUMIN: 3.6 g/dL (ref 3.5–5.0)
ALK PHOS: 63 U/L (ref 40–150)
ALT: 18 U/L (ref 0–55)
ANION GAP: 9 meq/L (ref 3–11)
AST: 20 U/L (ref 5–34)
BUN: 12.8 mg/dL (ref 7.0–26.0)
CALCIUM: 9.4 mg/dL (ref 8.4–10.4)
CHLORIDE: 105 meq/L (ref 98–109)
CO2: 23 mEq/L (ref 22–29)
Creatinine: 1.2 mg/dL (ref 0.7–1.3)
EGFR: 54 mL/min/{1.73_m2} — ABNORMAL LOW (ref >90)
Glucose: 101 mg/dl (ref 70–140)
POTASSIUM: 4.8 meq/L (ref 3.5–5.1)
Sodium: 137 mEq/L (ref 136–145)
Total Bilirubin: 0.57 mg/dL (ref 0.20–1.20)
Total Protein: 7.6 g/dL (ref 6.4–8.3)

## 2016-04-30 LAB — LACTATE DEHYDROGENASE: LDH: 144 U/L (ref 125–245)

## 2016-04-30 MED ORDER — HYDROXYUREA 500 MG PO CAPS
500.0000 mg | ORAL_CAPSULE | Freq: Every day | ORAL | 1 refills | Status: DC
Start: 2016-04-30 — End: 2016-10-18

## 2016-04-30 NOTE — Telephone Encounter (Signed)
Appointments scheduled per 04/30/16 los. Patient was given a copy of the AVS report and appointment schedule per 04/30/16 los. °

## 2016-04-30 NOTE — Progress Notes (Signed)
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  PRIOR THERAPY: Phlebotomy on as-needed basis.   CURRENT THERAPY: Hydroxyurea 500 mg by mouth daily.   INTERVAL HISTORY: [name] 83 y.o. male returns to the clinic today for follow-up visit. The patient is doing fine today with no specific complaints. He is tolerating his treatment with hydroxyurea fairly well. He denied having any significant weight loss or night sweats. He has no nausea, vomiting, diarrhea or constipation. He denied having any chest pain, shortness of breath, cough or hemoptysis. He is here today for evaluation and repeat blood work.  MEDICAL HISTORY: Past Medical History:  Diagnosis Date  . Chest pain   . Coronary artery disease   . Diabetes mellitus without complication ([place])   . Hyperlipidemia   . Hypertension     ALLERGIES:  has No Known Allergies.  MEDICATIONS:  Current Outpatient Prescriptions  Medication Sig Dispense Refill  . acetaminophen (TYLENOL) 325 MG tablet Take 650 mg by mouth every 6 (six) hours as needed for moderate pain. Reported on 01/23/2015    . aspirin 325 MG tablet Take 325 mg by mouth every morning.     Marland Kitchen CINNAMON PO Take 1 capsule by mouth 2 (two) times daily.    Marland Kitchen gabapentin (NEURONTIN) 100 MG capsule Take 100 mg by mouth 2 (two) times daily.    Marland Kitchen glimepiride (AMARYL) 4 MG tablet Take 4 mg by mouth daily before breakfast.    . hydroxyurea (HYDREA) 500 MG capsule Take 1 capsule (500 mg total) by mouth daily. May take with food to minimize GI side effects. 90 capsule 1  . losartan (COZAAR) 100 MG tablet Take 100 mg by mouth daily.    . metoprolol (LOPRESSOR) 50 MG tablet TK 1 T PO BID  0  . Multiple Vitamin (MULTIVITAMIN WITH MINERALS) TABS tablet Take 1 tablet  by mouth every morning.    . Omega-3 Fatty Acids (FISH OIL) 1200 MG CAPS Take 1 capsule by mouth 2 (two) times daily.    Marland Kitchen omeprazole (PRILOSEC) 20 MG capsule Take 20 mg by mouth daily.    . simvastatin (ZOCOR) 40 MG tablet Take 40 mg by mouth every evening.    Marland Kitchen HYDROcodone-acetaminophen (NORCO/VICODIN) 5-325 MG per tablet Take 1-2 tablets by mouth every 6 (six) hours as needed for moderate pain or severe pain. (Patient not taking: Reported on 04/30/2016) 15 tablet 0  . hydrocortisone 2.5 % cream APPLY TOPICALLY TO AFFECTED AREA TO RASH  QD PRN  1   No current facility-administered medications for this visit.     SURGICAL HISTORY:  Past Surgical History:  Procedure Laterality Date  . CARDIAC CATHETERIZATION  11/01/2005   patent stents with normal LV function  . CARDIAC CATHETERIZATION  11/08/2003   cypher stent mid dominant right coronary lesion  . CARDIAC SURGERY    . CORONARY ANGIOPLASTY     post LAD and RCA stenting  . DOPPLER ECHOCARDIOGRAPHY  06/18/2010   EF =>55%,LV normal  . EYE SURGERY    . holter monitor  11/08/2005  . NM MYOCAR PERF WALL MOTION  05/23/2008   EF 62% ,norm myocardial perfusion    REVIEW OF SYSTEMS:  A comprehensive review of systems was negative.   PHYSICAL EXAMINATION: General appearance: alert, cooperative and no distress  Head: Normocephalic, without obvious abnormality, atraumatic Neck: no adenopathy, no JVD, supple, symmetrical, trachea midline and thyroid not enlarged, symmetric, no tenderness/mass/nodules Lymph nodes: Cervical, supraclavicular, and axillary nodes normal. Resp: clear to auscultation bilaterally Back: symmetric, no curvature. ROM normal. No CVA tenderness. Cardio: regular rate and rhythm, S1, S2 normal, no murmur, click, rub or gallop GI: soft, non-tender; bowel sounds normal; no masses,  no organomegaly Extremities: extremities normal, atraumatic, no cyanosis or edema  ECOG PERFORMANCE STATUS: 1 - Symptomatic but completely  ambulatory  Blood pressure 129/66, pulse (!) 58, temperature 98.4 F (36.9 C), temperature source Oral, resp. rate 18, height 6' (1.829 m), weight 214 lb 9.6 oz (97.3 kg), SpO2 100 %.  LABORATORY DATA: Lab Results  Component Value Date   WBC 8.6 04/30/2016   HGB 15.0 04/30/2016   HCT 46.2 04/30/2016   MCV 84.3 04/30/2016   PLT 337 04/30/2016      Chemistry      Component Value Date/Time   NA 137 04/30/2016 1031   K 4.8 04/30/2016 1031   CL 102 06/19/2012 0530   CO2 23 04/30/2016 1031   BUN 12.8 04/30/2016 1031   CREATININE 1.2 04/30/2016 1031      Component Value Date/Time   CALCIUM 9.4 04/30/2016 1031   ALKPHOS 63 04/30/2016 1031   AST 20 04/30/2016 1031   ALT 18 04/30/2016 1031   BILITOT 0.57 04/30/2016 1031       RADIOGRAPHIC STUDIES: No results found.  ASSESSMENT AND PLAN:  This is a very pleasant 83 years old white male with history of myeloproliferative disorder, polycythemia vera with positive JAK 2 mutation. The patient is currently on treatment with hydroxyurea 500 mg by mouth daily and has been tolerating this treatment well. Her CBC today showed normal white blood count, hemoglobin and hematocrit as well as platelets count. I recommended for the patient to continue his current treatment with hydroxyurea with the same dose. I will see him back for follow-up visit in 6 months for reevaluation with repeat blood work. He was advised to call immediately if he has any concerning symptoms in the interval. The patient voices understanding of current disease status and treatment options and is in agreement with the current care plan. All questions were answered. The patient knows to call the clinic with any problems, questions or concerns. We can certainly see the patient much sooner if necessary. I spent 10 minutes counseling the patient face to face. The total time spent in the appointment was 15 minutes.  Disclaimer: This note was dictated with voice recognition  software. Similar sounding words can inadvertently be transcribed and may not be corrected upon review.

## 2016-05-01 DIAGNOSIS — H903 Sensorineural hearing loss, bilateral: Secondary | ICD-10-CM | POA: Diagnosis not present

## 2016-05-01 DIAGNOSIS — H6121 Impacted cerumen, right ear: Secondary | ICD-10-CM | POA: Diagnosis not present

## 2016-05-01 DIAGNOSIS — H9312 Tinnitus, left ear: Secondary | ICD-10-CM | POA: Diagnosis not present

## 2016-05-01 DIAGNOSIS — H6522 Chronic serous otitis media, left ear: Secondary | ICD-10-CM | POA: Diagnosis not present

## 2016-05-02 DIAGNOSIS — J159 Unspecified bacterial pneumonia: Secondary | ICD-10-CM | POA: Diagnosis not present

## 2016-05-02 DIAGNOSIS — M859 Disorder of bone density and structure, unspecified: Secondary | ICD-10-CM | POA: Diagnosis not present

## 2016-05-02 DIAGNOSIS — M8580 Other specified disorders of bone density and structure, unspecified site: Secondary | ICD-10-CM | POA: Diagnosis not present

## 2016-05-02 DIAGNOSIS — Z0000 Encounter for general adult medical examination without abnormal findings: Secondary | ICD-10-CM | POA: Diagnosis not present

## 2016-05-07 DIAGNOSIS — I10 Essential (primary) hypertension: Secondary | ICD-10-CM | POA: Diagnosis not present

## 2016-05-07 DIAGNOSIS — Z0000 Encounter for general adult medical examination without abnormal findings: Secondary | ICD-10-CM | POA: Diagnosis not present

## 2016-05-07 DIAGNOSIS — E119 Type 2 diabetes mellitus without complications: Secondary | ICD-10-CM | POA: Diagnosis not present

## 2016-05-14 DIAGNOSIS — E7800 Pure hypercholesterolemia, unspecified: Secondary | ICD-10-CM | POA: Diagnosis not present

## 2016-05-14 DIAGNOSIS — I10 Essential (primary) hypertension: Secondary | ICD-10-CM | POA: Diagnosis not present

## 2016-05-14 DIAGNOSIS — E119 Type 2 diabetes mellitus without complications: Secondary | ICD-10-CM | POA: Diagnosis not present

## 2016-05-14 DIAGNOSIS — E039 Hypothyroidism, unspecified: Secondary | ICD-10-CM | POA: Diagnosis not present

## 2016-05-22 DIAGNOSIS — R600 Localized edema: Secondary | ICD-10-CM | POA: Diagnosis not present

## 2016-06-28 ENCOUNTER — Other Ambulatory Visit: Payer: Self-pay | Admitting: Nurse Practitioner

## 2016-09-07 ENCOUNTER — Other Ambulatory Visit: Payer: Self-pay | Admitting: Nurse Practitioner

## 2016-10-18 ENCOUNTER — Other Ambulatory Visit: Payer: Self-pay | Admitting: Internal Medicine

## 2016-10-29 ENCOUNTER — Ambulatory Visit (HOSPITAL_BASED_OUTPATIENT_CLINIC_OR_DEPARTMENT_OTHER): Payer: PPO | Admitting: Internal Medicine

## 2016-10-29 ENCOUNTER — Encounter: Payer: Self-pay | Admitting: Internal Medicine

## 2016-10-29 ENCOUNTER — Telehealth: Payer: Self-pay

## 2016-10-29 ENCOUNTER — Other Ambulatory Visit (HOSPITAL_BASED_OUTPATIENT_CLINIC_OR_DEPARTMENT_OTHER): Payer: PPO

## 2016-10-29 VITALS — BP 125/63 | HR 58 | Temp 98.4°F | Resp 18 | Ht 72.0 in | Wt 219.8 lb

## 2016-10-29 DIAGNOSIS — D696 Thrombocytopenia, unspecified: Secondary | ICD-10-CM | POA: Diagnosis not present

## 2016-10-29 DIAGNOSIS — D75839 Thrombocytosis, unspecified: Secondary | ICD-10-CM

## 2016-10-29 DIAGNOSIS — D473 Essential (hemorrhagic) thrombocythemia: Secondary | ICD-10-CM | POA: Diagnosis not present

## 2016-10-29 DIAGNOSIS — D47Z9 Other specified neoplasms of uncertain behavior of lymphoid, hematopoietic and related tissue: Secondary | ICD-10-CM

## 2016-10-29 DIAGNOSIS — D472 Monoclonal gammopathy: Secondary | ICD-10-CM

## 2016-10-29 DIAGNOSIS — D471 Chronic myeloproliferative disease: Secondary | ICD-10-CM

## 2016-10-29 LAB — COMPREHENSIVE METABOLIC PANEL
ALT: 12 U/L (ref 0–55)
ANION GAP: 8 meq/L (ref 3–11)
AST: 18 U/L (ref 5–34)
Albumin: 3.6 g/dL (ref 3.5–5.0)
Alkaline Phosphatase: 62 U/L (ref 40–150)
BUN: 13.5 mg/dL (ref 7.0–26.0)
CHLORIDE: 104 meq/L (ref 98–109)
CO2: 25 meq/L (ref 22–29)
Calcium: 9.1 mg/dL (ref 8.4–10.4)
Creatinine: 1.2 mg/dL (ref 0.7–1.3)
EGFR: 56 mL/min/{1.73_m2} — AB (ref >60)
GLUCOSE: 96 mg/dL (ref 70–140)
POTASSIUM: 4.9 meq/L (ref 3.5–5.1)
SODIUM: 137 meq/L (ref 136–145)
Total Bilirubin: 0.46 mg/dL (ref 0.20–1.20)
Total Protein: 7.8 g/dL (ref 6.4–8.3)

## 2016-10-29 LAB — CBC WITH DIFFERENTIAL/PLATELET
BASO%: 2.2 % — ABNORMAL HIGH (ref 0.0–2.0)
Basophils Absolute: 0.2 10*3/uL — ABNORMAL HIGH (ref 0.0–0.1)
EOS ABS: 0.2 10*3/uL (ref 0.0–0.5)
EOS%: 2.1 % (ref 0.0–7.0)
HCT: 45.9 % (ref 38.4–49.9)
HGB: 14.4 g/dL (ref 13.0–17.1)
LYMPH#: 1.5 10*3/uL (ref 0.9–3.3)
LYMPH%: 19.1 % (ref 14.0–49.0)
MCH: 28.2 pg (ref 27.2–33.4)
MCHC: 31.4 g/dL — AB (ref 32.0–36.0)
MCV: 90 fL (ref 79.3–98.0)
MONO#: 1.2 10*3/uL — ABNORMAL HIGH (ref 0.1–0.9)
MONO%: 14.3 % — AB (ref 0.0–14.0)
NEUT#: 5 10*3/uL (ref 1.5–6.5)
NEUT%: 62.3 % (ref 39.0–75.0)
PLATELETS: 433 10*3/uL — AB (ref 140–400)
RBC: 5.1 10*6/uL (ref 4.20–5.82)
RDW: 16.2 % — ABNORMAL HIGH (ref 11.0–14.6)
WBC: 8.1 10*3/uL (ref 4.0–10.3)
nRBC: 0 % (ref 0–0)

## 2016-10-29 LAB — LACTATE DEHYDROGENASE: LDH: 182 U/L (ref 125–245)

## 2016-10-29 NOTE — Telephone Encounter (Signed)
Printed avs and calender for upcoming appointment. Per 10/23 los 

## 2016-10-29 NOTE — Progress Notes (Signed)
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  PRIOR THERAPY: Phlebotomy on as-needed basis.   CURRENT THERAPY: Hydroxyurea 500 mg by mouth daily.   INTERVAL HISTORY: [name] 83 y.o. male returns to the clinic today for follow-up visit. The patient is feeling fine today with no specific complaints. He denied having any chest pain, shortness of breath, cough or hemoptysis. He denied having any recent weight loss or night sweats. He has no nausea, vomiting, diarrhea or constipation. He denied having any bleeding issues. He is tolerating his current treatment with hydroxyurea fairly well. He is here today for evaluation and repeat blood work.  MEDICAL HISTORY: Past Medical History:  Diagnosis Date  . Chest pain   . Coronary artery disease   . Diabetes mellitus without complication ([place])   . Hyperlipidemia   . Hypertension     ALLERGIES:  has No Known Allergies.  MEDICATIONS:  Current Outpatient Prescriptions  Medication Sig Dispense Refill  . acetaminophen (TYLENOL) 325 MG tablet Take 650 mg by mouth every 6 (six) hours as needed for moderate pain. Reported on 01/23/2015    . aspirin 325 MG tablet Take 325 mg by mouth every morning.     Marland Kitchen CINNAMON PO Take 1 capsule by mouth 2 (two) times daily.    Marland Kitchen gabapentin (NEURONTIN) 100 MG capsule Take 100 mg by mouth 2 (two) times daily.    Marland Kitchen glimepiride (AMARYL) 4 MG tablet Take 4 mg by mouth daily before breakfast.    . hydrocortisone 2.5 % cream APPLY TOPICALLY TO AFFECTED AREA TO RASH  QD PRN  1  . hydroxyurea (HYDREA) 500 MG capsule TAKE 1 CAPSULE(500 MG) BY MOUTH DAILY. MAY TAKE WITH FOOD TO MINIMIZE GI SIDE EFFECTS 90 capsule 0  . losartan (COZAAR) 100 MG tablet Take 100 mg by mouth daily.    . metoprolol  (LOPRESSOR) 50 MG tablet TK 1 T PO BID  0  . Multiple Vitamin (MULTIVITAMIN WITH MINERALS) TABS tablet Take 1 tablet by mouth every morning.    . Omega-3 Fatty Acids (FISH OIL) 1200 MG CAPS Take 1 capsule by mouth 2 (two) times daily.    Marland Kitchen omeprazole (PRILOSEC) 20 MG capsule Take 20 mg by mouth daily.    . simvastatin (ZOCOR) 40 MG tablet Take 40 mg by mouth every evening.    Marland Kitchen HYDROcodone-acetaminophen (NORCO/VICODIN) 5-325 MG per tablet Take 1-2 tablets by mouth every 6 (six) hours as needed for moderate pain or severe pain. (Patient not taking: Reported on 10/29/2016) 15 tablet 0   No current facility-administered medications for this visit.     SURGICAL HISTORY:  Past Surgical History:  Procedure Laterality Date  . CARDIAC CATHETERIZATION  11/01/2005   patent stents with normal LV function  . CARDIAC CATHETERIZATION  11/08/2003   cypher stent mid dominant right coronary lesion  . CARDIAC SURGERY    . CORONARY ANGIOPLASTY     post LAD and RCA stenting  . DOPPLER ECHOCARDIOGRAPHY  06/18/2010   EF =>55%,LV normal  . EYE SURGERY    . holter monitor  11/08/2005  . NM MYOCAR PERF WALL MOTION  05/23/2008   EF 62% ,norm myocardial perfusion    REVIEW OF SYSTEMS:  A comprehensive review of systems was negative.   PHYSICAL EXAMINATION: General appearance:  alert, cooperative and no distress Head: Normocephalic, without obvious abnormality, atraumatic Neck: no adenopathy, no JVD, supple, symmetrical, trachea midline and thyroid not enlarged, symmetric, no tenderness/mass/nodules Lymph nodes: Cervical, supraclavicular, and axillary nodes normal. Resp: clear to auscultation bilaterally Back: symmetric, no curvature. ROM normal. No CVA tenderness. Cardio: regular rate and rhythm, S1, S2 normal, no murmur, click, rub or gallop GI: soft, non-tender; bowel sounds normal; no masses,  no organomegaly Extremities: extremities normal, atraumatic, no cyanosis or edema  ECOG PERFORMANCE STATUS: 1  - Symptomatic but completely ambulatory  Blood pressure 125/63, pulse (!) 58, temperature 98.4 F (36.9 C), temperature source Oral, resp. rate 18, height 6' (1.829 m), weight 219 lb 12.8 oz (99.7 kg), SpO2 100 %.  LABORATORY DATA: Lab Results  Component Value Date   WBC 8.1 10/29/2016   HGB 14.4 10/29/2016   HCT 45.9 10/29/2016   MCV 90.0 10/29/2016   PLT 433 (H) 10/29/2016      Chemistry      Component Value Date/Time   NA 137 10/29/2016 1018   K 4.9 10/29/2016 1018   CL 102 06/19/2012 0530   CO2 25 10/29/2016 1018   BUN 13.5 10/29/2016 1018   CREATININE 1.2 10/29/2016 1018      Component Value Date/Time   CALCIUM 9.1 10/29/2016 1018   ALKPHOS 62 10/29/2016 1018   AST 18 10/29/2016 1018   ALT 12 10/29/2016 1018   BILITOT 0.46 10/29/2016 1018       RADIOGRAPHIC STUDIES: No results found.  ASSESSMENT AND PLAN:  This is a very pleasant 83 years old white male with history of myeloproliferative disorder, polycythemia vera with positive JAK 2 mutation. The patient is currently on treatment with hydroxyurea 500 mg by mouth daily. The patient is feeling fine today with no specific complaints. CBC today showed slightly elevated platelets count. I recommended for him of continue his current treatment with hydroxyurea 500 mg by mouth daily. I would see him back for follow-up visit in 6 months for reevaluation with repeat CBC, comprehensive metabolic panel and LDH. The patient was advised to call immediately if he has any concerning symptoms in the interval.. The patient voices understanding of current disease status and treatment options and is in agreement with the current care plan. All questions were answered. The patient knows to call the clinic with any problems, questions or concerns. We can certainly see the patient much sooner if necessary. I spent 10 minutes counseling the patient face to face. The total time spent in the appointment was 15 minutes.  Disclaimer:  This note was dictated with voice recognition software. Similar sounding words can inadvertently be transcribed and may not be corrected upon review.

## 2016-11-01 DIAGNOSIS — H527 Unspecified disorder of refraction: Secondary | ICD-10-CM | POA: Diagnosis not present

## 2016-11-01 DIAGNOSIS — H26493 Other secondary cataract, bilateral: Secondary | ICD-10-CM | POA: Diagnosis not present

## 2016-11-05 DIAGNOSIS — I10 Essential (primary) hypertension: Secondary | ICD-10-CM | POA: Diagnosis not present

## 2016-11-05 DIAGNOSIS — E1142 Type 2 diabetes mellitus with diabetic polyneuropathy: Secondary | ICD-10-CM | POA: Diagnosis not present

## 2016-11-05 DIAGNOSIS — E7800 Pure hypercholesterolemia, unspecified: Secondary | ICD-10-CM | POA: Diagnosis not present

## 2016-11-12 DIAGNOSIS — E7800 Pure hypercholesterolemia, unspecified: Secondary | ICD-10-CM | POA: Diagnosis not present

## 2016-11-12 DIAGNOSIS — E119 Type 2 diabetes mellitus without complications: Secondary | ICD-10-CM | POA: Diagnosis not present

## 2016-11-12 DIAGNOSIS — I10 Essential (primary) hypertension: Secondary | ICD-10-CM | POA: Diagnosis not present

## 2016-11-12 DIAGNOSIS — Z125 Encounter for screening for malignant neoplasm of prostate: Secondary | ICD-10-CM | POA: Diagnosis not present

## 2016-11-12 DIAGNOSIS — M549 Dorsalgia, unspecified: Secondary | ICD-10-CM | POA: Diagnosis not present

## 2016-11-12 DIAGNOSIS — Z23 Encounter for immunization: Secondary | ICD-10-CM | POA: Diagnosis not present

## 2017-01-14 ENCOUNTER — Ambulatory Visit (INDEPENDENT_AMBULATORY_CARE_PROVIDER_SITE_OTHER): Payer: Medicare HMO | Admitting: Cardiovascular Disease

## 2017-01-14 ENCOUNTER — Encounter: Payer: Self-pay | Admitting: Cardiovascular Disease

## 2017-01-14 VITALS — BP 130/74 | HR 58 | Ht 72.0 in | Wt 224.0 lb

## 2017-01-14 DIAGNOSIS — I10 Essential (primary) hypertension: Secondary | ICD-10-CM

## 2017-01-14 DIAGNOSIS — R079 Chest pain, unspecified: Secondary | ICD-10-CM | POA: Diagnosis not present

## 2017-01-14 NOTE — Patient Instructions (Signed)
Medication Instructions: Your physician recommends that you continue on your current medications as directed. Please refer to the Current Medication list given to you today.  Testing/Procedures: Your physician has requested that you have a lexiscan myoview. For further information please visit [email or web address]. Please follow instruction sheet, as given.  Follow-Up: Your physician wants you to follow-up in: 1 year with Dr. Gwenlyn Found. You will receive a reminder letter in the mail two months in advance. If you don't receive a letter, please call our office to schedule the follow-up appointment.  If you need a refill on your cardiac medications before your next appointment, please call your pharmacy.

## 2017-01-14 NOTE — Assessment & Plan Note (Signed)
History of CAD status post LAD and RCA stenting in the past. He was catheterized by Dr. Melvern Banker in 2007, appendectomy stents and normal LV function. He had an echo and Myoview stress test performed October 2014 which were normal. Since I saw him last he developed chest pain occurring several times a week over the last several months.

## 2017-01-14 NOTE — Progress Notes (Signed)
01/14/2017 [name]   1932-12-04  [id number]  Primary Physician [name], MD Primary Cardiologist: Lorretta Harp MD [facility], [place], [place]  HPI:  [name] is a 84 y.o.  old mildly overweight widowed Caucasian male (wife died 2010-09-15 at age 73), father of 74, grandfather to 6 grandchildren who I last saw in the office 01/10/16 . He has a history of CAD status post LAD and RCA stenting in the past. He was last catheterized by Dr. Melvern Banker in 2007 and found to have patent stents with normal LV function. His other problems include hypertension, hyperlipidemia and diabetes. He does not smoke or drink.Since I saw him 11/26/13 he has been relatively asymptomatic. He had a Myoview stress test and 2-D echo performed in October 2014 year which were normal. He has also gotten engaged since I last saw him and remains engaged to this day. He complains of erectile dysfunction. Since I saw him last he has developed chest pain or last several months occurring several times a week lasting seconds to minutes at a time.     Current Meds  Medication Sig  . acetaminophen (TYLENOL) 325 MG tablet Take 650 mg by mouth every 6 (six) hours as needed for moderate pain. Reported on 01/23/2015  . aspirin 325 MG tablet Take 325 mg by mouth every morning.   Marland Kitchen CINNAMON PO Take 1 capsule by mouth 2 (two) times daily.  Marland Kitchen gabapentin (NEURONTIN) 100 MG capsule Take 100 mg by mouth 2 (two) times daily.  Marland Kitchen glimepiride (AMARYL) 4 MG tablet Take 4 mg by mouth daily before breakfast.  . HYDROcodone-acetaminophen (NORCO/VICODIN) 5-325 MG per tablet Take 1-2 tablets by mouth every 6 (six) hours as needed for moderate pain or severe pain.  . hydrocortisone 2.5 % cream APPLY TOPICALLY TO AFFECTED AREA TO RASH  QD PRN  . hydroxyurea (HYDREA) 500 MG capsule TAKE 1 CAPSULE(500 MG) BY MOUTH DAILY. MAY TAKE WITH FOOD TO MINIMIZE GI SIDE EFFECTS  . losartan (COZAAR) 100 MG tablet Take 100 mg by mouth daily.  .  metoprolol (LOPRESSOR) 50 MG tablet TK 1 T PO BID  . Multiple Vitamin (MULTIVITAMIN WITH MINERALS) TABS tablet Take 1 tablet by mouth every morning.  . Omega-3 Fatty Acids (FISH OIL) 1200 MG CAPS Take 1 capsule by mouth 2 (two) times daily.  Marland Kitchen omeprazole (PRILOSEC) 20 MG capsule Take 20 mg by mouth daily.  . simvastatin (ZOCOR) 40 MG tablet Take 40 mg by mouth every evening.     No Known Allergies  Social History   Socioeconomic History  . Marital status: Married    Spouse name: Not on file  . Number of children: 4  . Years of education: 45  . Highest education level: Not on file  Social Needs  . Financial resource strain: Not on file  . Food insecurity - worry: Not on file  . Food insecurity - inability: Not on file  . Transportation needs - medical: Not on file  . Transportation needs - non-medical: Not on file  Occupational History  . Not on file  Tobacco Use  . Smoking status: Former Smoker    Types: Cigarettes  . Smokeless tobacco: Former Systems developer    Quit date: 10/31/1982  Substance and Sexual Activity  . Alcohol use: Yes    Comment: 1 glass wine daily  . Drug use: No  . Sexual activity: No  Other Topics Concern  . Not on file  Social  History Narrative  . Not on file     Review of Systems: General: negative for chills, fever, night sweats or weight changes.  Cardiovascular: negative for chest pain, dyspnea on exertion, edema, orthopnea, palpitations, paroxysmal nocturnal dyspnea or shortness of breath Dermatological: negative for rash Respiratory: negative for cough or wheezing Urologic: negative for hematuria Abdominal: negative for nausea, vomiting, diarrhea, bright red blood per rectum, melena, or hematemesis Neurologic: negative for visual changes, syncope, or dizziness All other systems reviewed and are otherwise negative except as noted above.    Blood pressure 130/74, pulse (!) 58, height 6' (1.829 m), weight 224 lb (101.6 kg).  General appearance: alert  and no distress Neck: no adenopathy, no carotid bruit, no JVD, supple, symmetrical, trachea midline and thyroid not enlarged, symmetric, no tenderness/mass/nodules Lungs: clear to auscultation bilaterally Heart: regular rate and rhythm, S1, S2 normal, no murmur, click, rub or gallop Extremities: extremities normal, atraumatic, no cyanosis or edema Pulses: 2+ and symmetric Skin: Skin color, texture, turgor normal. No rashes or lesions Neurologic: Alert and oriented X 3, normal strength and tone. Normal symmetric reflexes. Normal coordination and gait  EKG sinus bradycardia at 59 without ST or T-wave changes. I personally reviewed this EKG.  ASSESSMENT AND PLAN:   HYPERLIPIDEMIA History of hyperlipidemia on statin therapy followed by his PCP  Essential hypertension History of essential hypertension blood pressure measures 130/74. He is on losartan and metoprolol. Continue current meds at current dosing.  Coronary atherosclerosis History of CAD status post LAD and RCA stenting in the past. He was catheterized by Dr. Melvern Banker in 2007, appendectomy stents and normal LV function. He had an echo and Myoview stress test performed October 2014 which were normal. Since I saw him last he developed chest pain occurring several times a week over the last several months.      Lorretta Harp MD FACP,FACC,FAHA, [facility] 01/14/2017 12:39 PM

## 2017-01-14 NOTE — Assessment & Plan Note (Signed)
History of hyperlipidemia on statin therapy followed by his PCP 

## 2017-01-14 NOTE — Assessment & Plan Note (Signed)
History of essential hypertension blood pressure measures 130/74. He is on losartan and metoprolol. Continue current meds at current dosing.

## 2017-01-16 ENCOUNTER — Telehealth (HOSPITAL_COMMUNITY): Payer: Self-pay

## 2017-01-16 ENCOUNTER — Telehealth: Payer: Self-pay | Admitting: Cardiovascular Disease

## 2017-01-16 NOTE — Telephone Encounter (Signed)
Encounter complete. 

## 2017-01-16 NOTE — Telephone Encounter (Signed)
New message    Patient returning call to nurse. Please call, okay to leave detailed message [id number].

## 2017-01-21 ENCOUNTER — Ambulatory Visit (HOSPITAL_COMMUNITY)
Admission: RE | Admit: 2017-01-21 | Discharge: 2017-01-21 | Disposition: A | Discharge status: Home / Self Care | Payer: Medicare HMO | Source: Ambulatory Visit | Attending: Cardiovascular Disease | Admitting: Cardiovascular Disease

## 2017-01-21 DIAGNOSIS — R079 Chest pain, unspecified: Secondary | ICD-10-CM

## 2017-01-23 ENCOUNTER — Ambulatory Visit (HOSPITAL_COMMUNITY)
Admission: RE | Admit: 2017-01-23 | Discharge: 2017-01-23 | Disposition: A | Discharge status: Home / Self Care | Payer: Medicare HMO | Source: Ambulatory Visit | Attending: Cardiology | Admitting: Cardiology

## 2017-01-23 DIAGNOSIS — R079 Chest pain, unspecified: Secondary | ICD-10-CM | POA: Insufficient documentation

## 2017-01-23 DIAGNOSIS — I2510 Atherosclerotic heart disease of native coronary artery without angina pectoris: Secondary | ICD-10-CM | POA: Insufficient documentation

## 2017-01-23 DIAGNOSIS — R0609 Other forms of dyspnea: Secondary | ICD-10-CM | POA: Insufficient documentation

## 2017-01-23 DIAGNOSIS — I10 Essential (primary) hypertension: Secondary | ICD-10-CM | POA: Insufficient documentation

## 2017-01-23 DIAGNOSIS — Z87891 Personal history of nicotine dependence: Secondary | ICD-10-CM | POA: Insufficient documentation

## 2017-01-23 DIAGNOSIS — Z8249 Family history of ischemic heart disease and other diseases of the circulatory system: Secondary | ICD-10-CM | POA: Diagnosis not present

## 2017-01-23 DIAGNOSIS — Z9861 Coronary angioplasty status: Secondary | ICD-10-CM | POA: Insufficient documentation

## 2017-01-23 DIAGNOSIS — E119 Type 2 diabetes mellitus without complications: Secondary | ICD-10-CM | POA: Insufficient documentation

## 2017-01-23 LAB — MYOCARDIAL PERFUSION IMAGING
CHL CUP NUCLEAR SDS: 2
CHL CUP NUCLEAR SRS: 1
LV dias vol: 104 mL (ref 62–150)
LVSYSVOL: 37 mL
NUC STRESS TID: 0.94
Peak HR: 78 {beats}/min
Rest HR: 55 {beats}/min
SSS: 3

## 2017-01-23 MED ORDER — TECHNETIUM TC 99M TETROFOSMIN IV KIT
30.4000 | PACK | Freq: Once | INTRAVENOUS | Status: AC | PRN
Start: 2017-01-23 — End: 2017-01-23
  Administered 2017-01-23: 30.4 via INTRAVENOUS
  Filled 2017-01-23: qty 31

## 2017-01-23 MED ORDER — REGADENOSON 0.4 MG/5ML IV SOLN
0.4000 mg | Freq: Once | INTRAVENOUS | Status: AC
  Administered 2017-01-23: 0.4 mg via INTRAVENOUS

## 2017-01-23 MED ORDER — TECHNETIUM TC 99M TETROFOSMIN IV KIT
10.1000 | PACK | Freq: Once | INTRAVENOUS | Status: AC | PRN
  Administered 2017-01-23: 10.1 via INTRAVENOUS
  Filled 2017-01-23: qty 11

## 2017-01-31 NOTE — Telephone Encounter (Signed)
Pt was notified of results---see result note on myocardial perf. Imaging.

## 2017-04-21 DIAGNOSIS — R69 Illness, unspecified: Secondary | ICD-10-CM | POA: Diagnosis not present

## 2017-04-22 DIAGNOSIS — R69 Illness, unspecified: Secondary | ICD-10-CM | POA: Diagnosis not present

## 2017-04-23 ENCOUNTER — Other Ambulatory Visit: Payer: Self-pay | Admitting: Medical Oncology

## 2017-04-23 DIAGNOSIS — D473 Essential (hemorrhagic) thrombocythemia: Secondary | ICD-10-CM

## 2017-04-23 DIAGNOSIS — D75839 Thrombocytosis, unspecified: Secondary | ICD-10-CM

## 2017-04-23 MED ORDER — HYDROXYUREA 500 MG PO CAPS: ORAL_CAPSULE | ORAL | 0 refills | Status: DC

## 2017-04-29 ENCOUNTER — Ambulatory Visit: Payer: Self-pay | Admitting: Internal Medicine

## 2017-04-29 ENCOUNTER — Other Ambulatory Visit: Payer: Self-pay

## 2017-05-05 ENCOUNTER — Telehealth: Payer: Self-pay | Admitting: *Deleted

## 2017-05-05 NOTE — Telephone Encounter (Signed)
"  Calling to ask when is my next appointment?  I Called to have appoinment changed,.  Could you help me to confirm I have lab tomorrow at 9:00 am followed with visit with Dr,Mohamed at 9:30 am?"    Confirmed appointment information is correct.  Advised to arrive thirty minutes early for registration.  Denies further questions or needs at this time.

## 2017-05-06 ENCOUNTER — Encounter: Payer: Self-pay | Admitting: Internal Medicine

## 2017-05-06 ENCOUNTER — Inpatient Hospital Stay (HOSPITAL_BASED_OUTPATIENT_CLINIC_OR_DEPARTMENT_OTHER): Payer: Medicare HMO | Admitting: Internal Medicine

## 2017-05-06 ENCOUNTER — Telehealth: Payer: Self-pay | Admitting: Internal Medicine

## 2017-05-06 ENCOUNTER — Inpatient Hospital Stay: Payer: Medicare HMO | Attending: Internal Medicine

## 2017-05-06 VITALS — BP 105/58 | HR 60 | Temp 98.1°F | Resp 20 | Ht 72.0 in | Wt 213.6 lb

## 2017-05-06 DIAGNOSIS — E785 Hyperlipidemia, unspecified: Secondary | ICD-10-CM | POA: Diagnosis not present

## 2017-05-06 DIAGNOSIS — D473 Essential (hemorrhagic) thrombocythemia: Secondary | ICD-10-CM

## 2017-05-06 DIAGNOSIS — Z7982 Long term (current) use of aspirin: Secondary | ICD-10-CM | POA: Insufficient documentation

## 2017-05-06 DIAGNOSIS — I2510 Atherosclerotic heart disease of native coronary artery without angina pectoris: Secondary | ICD-10-CM | POA: Diagnosis not present

## 2017-05-06 DIAGNOSIS — I10 Essential (primary) hypertension: Secondary | ICD-10-CM | POA: Insufficient documentation

## 2017-05-06 DIAGNOSIS — Z79899 Other long term (current) drug therapy: Secondary | ICD-10-CM | POA: Diagnosis not present

## 2017-05-06 DIAGNOSIS — D472 Monoclonal gammopathy: Secondary | ICD-10-CM

## 2017-05-06 DIAGNOSIS — D75839 Thrombocytosis, unspecified: Secondary | ICD-10-CM

## 2017-05-06 DIAGNOSIS — E119 Type 2 diabetes mellitus without complications: Secondary | ICD-10-CM

## 2017-05-06 LAB — CBC WITH DIFFERENTIAL/PLATELET
Basophils Absolute: 0.2 10*3/uL — ABNORMAL HIGH (ref 0.0–0.1)
Basophils Relative: 2 %
EOS ABS: 0.2 10*3/uL (ref 0.0–0.5)
EOS PCT: 2 %
HCT: 44.4 % (ref 38.4–49.9)
HEMOGLOBIN: 14.2 g/dL (ref 13.0–17.1)
LYMPHS ABS: 1.2 10*3/uL (ref 0.9–3.3)
Lymphocytes Relative: 15 %
MCH: 26.9 pg — AB (ref 27.2–33.4)
MCHC: 31.9 g/dL — AB (ref 32.0–36.0)
MCV: 84.3 fL (ref 79.3–98.0)
MONOS PCT: 11 %
Monocytes Absolute: 0.9 10*3/uL (ref 0.1–0.9)
Neutro Abs: 5.8 10*3/uL (ref 1.5–6.5)
Neutrophils Relative %: 70 %
PLATELETS: 387 10*3/uL (ref 140–400)
RBC: 5.27 MIL/uL (ref 4.20–5.82)
RDW: 15.4 % — ABNORMAL HIGH (ref 11.0–14.6)
WBC: 8.3 10*3/uL (ref 4.0–10.3)

## 2017-05-06 LAB — COMPREHENSIVE METABOLIC PANEL
ALBUMIN: 3.8 g/dL (ref 3.5–5.0)
ALT: 17 U/L (ref 0–55)
AST: 20 U/L (ref 5–34)
Alkaline Phosphatase: 57 U/L (ref 40–150)
Anion gap: 9 (ref 3–11)
BUN: 13 mg/dL (ref 7–26)
CO2: 24 mmol/L (ref 22–29)
CREATININE: 1.47 mg/dL — AB (ref 0.70–1.30)
Calcium: 9.8 mg/dL (ref 8.4–10.4)
Chloride: 105 mmol/L (ref 98–109)
GFR calc Af Amer: 49 mL/min — ABNORMAL LOW (ref >60)
GFR calc non Af Amer: 43 mL/min — ABNORMAL LOW (ref >60)
Glucose, Bld: 117 mg/dL (ref 70–140)
Potassium: 4.9 mmol/L (ref 3.5–5.1)
SODIUM: 138 mmol/L (ref 136–145)
Total Bilirubin: 0.5 mg/dL (ref 0.2–1.2)
Total Protein: 7.7 g/dL (ref 6.4–8.3)

## 2017-05-06 LAB — LACTATE DEHYDROGENASE: LDH: 180 U/L (ref 125–245)

## 2017-05-06 NOTE — Progress Notes (Signed)
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  PRIOR THERAPY: Phlebotomy on as-needed basis.   CURRENT THERAPY: Hydroxyurea 500 mg by mouth daily.   INTERVAL HISTORY: [name] 84 y.o. male returns to the clinic today for follow-up visit.  The patient is feeling fine today with no specific complaints.  He denied having any chest pain, shortness of breath, cough or hemoptysis.  He denied having any fever or chills.  He has no nausea, vomiting, diarrhea or constipation.  The patient denied having any significant headache or visual changes.  He has no bleeding issues.  He continues to tolerate his treatment with Hydrea fairly well.  MEDICAL HISTORY: Past Medical History:  Diagnosis Date  . Chest pain   . Coronary artery disease   . Diabetes mellitus without complication ([place])   . Hyperlipidemia   . Hypertension     ALLERGIES:  has No Known Allergies.  MEDICATIONS:  Current Outpatient Medications  Medication Sig Dispense Refill  . acetaminophen (TYLENOL) 325 MG tablet Take 650 mg by mouth every 6 (six) hours as needed for moderate pain. Reported on 01/23/2015    . aspirin 325 MG tablet Take 325 mg by mouth every morning.     Marland Kitchen CINNAMON PO Take 1 capsule by mouth 2 (two) times daily.    Marland Kitchen gabapentin (NEURONTIN) 100 MG capsule Take 100 mg by mouth 2 (two) times daily.    Marland Kitchen glimepiride (AMARYL) 4 MG tablet Take 4 mg by mouth daily before breakfast.    . HYDROcodone-acetaminophen (NORCO/VICODIN) 5-325 MG per tablet Take 1-2 tablets by mouth every 6 (six) hours as needed for moderate pain or severe pain. 15 tablet 0  . hydrocortisone 2.5 % cream APPLY TOPICALLY TO AFFECTED AREA TO RASH  QD PRN  1  . hydroxyurea (HYDREA) 500 MG capsule TAKE 1 CAPSULE(500 MG) BY  MOUTH DAILY. MAY TAKE WITH FOOD TO MINIMIZE GI SIDE EFFECTS 90 capsule 0  . losartan (COZAAR) 100 MG tablet Take 100 mg by mouth daily.    . metoprolol (LOPRESSOR) 50 MG tablet TK 1 T PO BID  0  . Multiple Vitamin (MULTIVITAMIN WITH MINERALS) TABS tablet Take 1 tablet by mouth every morning.    . Omega-3 Fatty Acids (FISH OIL) 1200 MG CAPS Take 1 capsule by mouth 2 (two) times daily.    Marland Kitchen omeprazole (PRILOSEC) 20 MG capsule Take 20 mg by mouth daily.    . simvastatin (ZOCOR) 40 MG tablet Take 40 mg by mouth every evening.     No current facility-administered medications for this visit.     SURGICAL HISTORY:  Past Surgical History:  Procedure Laterality Date  . CARDIAC CATHETERIZATION  11/01/2005   patent stents with normal LV function  . CARDIAC CATHETERIZATION  11/08/2003   cypher stent mid dominant right coronary lesion  . CARDIAC SURGERY    . CORONARY ANGIOPLASTY     post LAD and RCA stenting  . DOPPLER ECHOCARDIOGRAPHY  06/18/2010   EF =>55%,LV normal  . EYE SURGERY    . holter monitor  11/08/2005  . NM MYOCAR PERF WALL MOTION  05/23/2008   EF 62% ,norm myocardial perfusion    REVIEW OF SYSTEMS:  A comprehensive review of systems was negative.   PHYSICAL EXAMINATION: General appearance: alert, cooperative and  no distress Head: Normocephalic, without obvious abnormality, atraumatic Neck: no adenopathy, no JVD, supple, symmetrical, trachea midline and thyroid not enlarged, symmetric, no tenderness/mass/nodules Lymph nodes: Cervical, supraclavicular, and axillary nodes normal. Resp: clear to auscultation bilaterally Back: symmetric, no curvature. ROM normal. No CVA tenderness. Cardio: regular rate and rhythm, S1, S2 normal, no murmur, click, rub or gallop GI: soft, non-tender; bowel sounds normal; no masses,  no organomegaly Extremities: extremities normal, atraumatic, no cyanosis or edema  ECOG PERFORMANCE STATUS: 1 - Symptomatic but completely ambulatory  Blood pressure  (!) 105/58, pulse 60, temperature 98.1 F (36.7 C), temperature source Oral, resp. rate 20, height 6' (1.829 m), weight 213 lb 9.6 oz (96.9 kg), SpO2 99 %.  LABORATORY DATA: Lab Results  Component Value Date   WBC 8.3 05/06/2017   HGB 14.2 05/06/2017   HCT 44.4 05/06/2017   MCV 84.3 05/06/2017   PLT 387 05/06/2017      Chemistry      Component Value Date/Time   NA 137 10/29/2016 1018   K 4.9 10/29/2016 1018   CL 102 06/19/2012 0530   CO2 25 10/29/2016 1018   BUN 13.5 10/29/2016 1018   CREATININE 1.2 10/29/2016 1018      Component Value Date/Time   CALCIUM 9.1 10/29/2016 1018   ALKPHOS 62 10/29/2016 1018   AST 18 10/29/2016 1018   ALT 12 10/29/2016 1018   BILITOT 0.46 10/29/2016 1018       RADIOGRAPHIC STUDIES: No results found.  ASSESSMENT AND PLAN:  This is a very pleasant 84 years old white male with history of myeloproliferative disorder, polycythemia vera with positive JAK 2 mutation. The patient is currently on treatment with hydroxyurea 500 mg by mouth daily. The patient has been tolerating this treatment well with no concerning complaints.  His CBC today is unremarkable.  I recommended for him to continue with hydroxyurea with the same dose.  I will see him back for follow-up visit in 6 months for evaluation with repeat CBC, comprehensive metabolic panel and LDH. The patient was advised to call immediately if he has any concerning symptoms in the interval. The patient voices understanding of current disease status and treatment options and is in agreement with the current care plan. All questions were answered. The patient knows to call the clinic with any problems, questions or concerns. We can certainly see the patient much sooner if necessary. I spent 10 minutes counseling the patient face to face. The total time spent in the appointment was 15 minutes.  Disclaimer: This note was dictated with voice recognition software. Similar sounding words can inadvertently  be transcribed and may not be corrected upon review.

## 2017-05-06 NOTE — Telephone Encounter (Signed)
Appointments scheduled AVS/Calendar printed per 4/30 los °

## 2017-05-21 DIAGNOSIS — I10 Essential (primary) hypertension: Secondary | ICD-10-CM | POA: Diagnosis not present

## 2017-05-21 DIAGNOSIS — Z125 Encounter for screening for malignant neoplasm of prostate: Secondary | ICD-10-CM | POA: Diagnosis not present

## 2017-05-21 DIAGNOSIS — E119 Type 2 diabetes mellitus without complications: Secondary | ICD-10-CM | POA: Diagnosis not present

## 2017-05-21 DIAGNOSIS — E7800 Pure hypercholesterolemia, unspecified: Secondary | ICD-10-CM | POA: Diagnosis not present

## 2017-05-23 DIAGNOSIS — E875 Hyperkalemia: Secondary | ICD-10-CM | POA: Diagnosis not present

## 2017-05-29 DIAGNOSIS — E119 Type 2 diabetes mellitus without complications: Secondary | ICD-10-CM | POA: Diagnosis not present

## 2017-05-29 DIAGNOSIS — I10 Essential (primary) hypertension: Secondary | ICD-10-CM | POA: Diagnosis not present

## 2017-05-29 DIAGNOSIS — M549 Dorsalgia, unspecified: Secondary | ICD-10-CM | POA: Diagnosis not present

## 2017-05-29 DIAGNOSIS — E039 Hypothyroidism, unspecified: Secondary | ICD-10-CM | POA: Diagnosis not present

## 2017-05-29 DIAGNOSIS — E7800 Pure hypercholesterolemia, unspecified: Secondary | ICD-10-CM | POA: Diagnosis not present

## 2017-05-29 DIAGNOSIS — E785 Hyperlipidemia, unspecified: Secondary | ICD-10-CM | POA: Diagnosis not present

## 2017-05-29 DIAGNOSIS — E875 Hyperkalemia: Secondary | ICD-10-CM | POA: Diagnosis not present

## 2017-05-29 DIAGNOSIS — Z0000 Encounter for general adult medical examination without abnormal findings: Secondary | ICD-10-CM | POA: Diagnosis not present

## 2017-05-29 DIAGNOSIS — D509 Iron deficiency anemia, unspecified: Secondary | ICD-10-CM | POA: Diagnosis not present

## 2017-05-29 DIAGNOSIS — B029 Zoster without complications: Secondary | ICD-10-CM | POA: Diagnosis not present

## 2017-06-27 ENCOUNTER — Telehealth: Payer: Self-pay | Admitting: Cardiovascular Disease

## 2017-06-27 MED ORDER — METOPROLOL TARTRATE 50 MG PO TABS: 50.0000 mg | ORAL_TABLET | Freq: Two times a day (BID) | ORAL | 2 refills | Status: DC

## 2017-06-27 NOTE — Telephone Encounter (Signed)
°*  STAT* If patient is at the pharmacy, call can be transferred to refill team.   1. Which medications need to be refilled? (please list name of each medication and dose if known) Metoprolol 50mg   2. Which pharmacy/location (including street and city if local pharmacy) is medication to be sent to? [facility]  3. Do they need a 30 day or 90 day supply? 90 day

## 2017-07-02 DIAGNOSIS — L218 Other seborrheic dermatitis: Secondary | ICD-10-CM | POA: Diagnosis not present

## 2017-07-02 DIAGNOSIS — C4441 Basal cell carcinoma of skin of scalp and neck: Secondary | ICD-10-CM | POA: Diagnosis not present

## 2017-07-02 DIAGNOSIS — L570 Actinic keratosis: Secondary | ICD-10-CM | POA: Diagnosis not present

## 2017-07-02 DIAGNOSIS — C44219 Basal cell carcinoma of skin of left ear and external auricular canal: Secondary | ICD-10-CM | POA: Diagnosis not present

## 2017-07-02 DIAGNOSIS — D485 Neoplasm of uncertain behavior of skin: Secondary | ICD-10-CM | POA: Diagnosis not present

## 2017-07-02 DIAGNOSIS — C44519 Basal cell carcinoma of skin of other part of trunk: Secondary | ICD-10-CM | POA: Diagnosis not present

## 2017-07-02 DIAGNOSIS — L821 Other seborrheic keratosis: Secondary | ICD-10-CM | POA: Diagnosis not present

## 2017-07-15 ENCOUNTER — Other Ambulatory Visit: Payer: Self-pay | Admitting: Medical Oncology

## 2017-07-15 DIAGNOSIS — D473 Essential (hemorrhagic) thrombocythemia: Secondary | ICD-10-CM

## 2017-07-15 DIAGNOSIS — D75839 Thrombocytosis, unspecified: Secondary | ICD-10-CM

## 2017-07-15 MED ORDER — HYDROXYUREA 500 MG PO CAPS: ORAL_CAPSULE | ORAL | 0 refills | Status: DC

## 2017-10-15 ENCOUNTER — Other Ambulatory Visit: Payer: Self-pay | Admitting: *Deleted

## 2017-10-15 DIAGNOSIS — D75839 Thrombocytosis, unspecified: Secondary | ICD-10-CM

## 2017-10-15 DIAGNOSIS — D473 Essential (hemorrhagic) thrombocythemia: Secondary | ICD-10-CM

## 2017-10-15 MED ORDER — HYDROXYUREA 500 MG PO CAPS
ORAL_CAPSULE | ORAL | 0 refills | Status: DC
Start: 2017-10-15 — End: 2018-04-15

## 2017-10-17 ENCOUNTER — Telehealth: Payer: Self-pay | Admitting: Medical Oncology

## 2017-10-17 NOTE — Telephone Encounter (Signed)
I called pharmacist and gave order for hydrea #90 with 1 refill.

## 2017-10-20 DIAGNOSIS — L989 Disorder of the skin and subcutaneous tissue, unspecified: Secondary | ICD-10-CM | POA: Diagnosis not present

## 2017-10-20 DIAGNOSIS — L821 Other seborrheic keratosis: Secondary | ICD-10-CM | POA: Diagnosis not present

## 2017-11-04 ENCOUNTER — Telehealth: Payer: Self-pay | Admitting: Internal Medicine

## 2017-11-04 ENCOUNTER — Inpatient Hospital Stay (HOSPITAL_BASED_OUTPATIENT_CLINIC_OR_DEPARTMENT_OTHER): Payer: Medicare HMO | Admitting: Internal Medicine

## 2017-11-04 ENCOUNTER — Encounter: Payer: Self-pay | Admitting: Internal Medicine

## 2017-11-04 ENCOUNTER — Inpatient Hospital Stay: Payer: Medicare HMO | Attending: Internal Medicine

## 2017-11-04 VITALS — BP 122/73 | HR 60 | Temp 98.0°F | Resp 14 | Ht 72.0 in | Wt 219.8 lb

## 2017-11-04 DIAGNOSIS — Z79899 Other long term (current) drug therapy: Secondary | ICD-10-CM | POA: Insufficient documentation

## 2017-11-04 DIAGNOSIS — E119 Type 2 diabetes mellitus without complications: Secondary | ICD-10-CM | POA: Insufficient documentation

## 2017-11-04 DIAGNOSIS — I2510 Atherosclerotic heart disease of native coronary artery without angina pectoris: Secondary | ICD-10-CM | POA: Insufficient documentation

## 2017-11-04 DIAGNOSIS — Z7982 Long term (current) use of aspirin: Secondary | ICD-10-CM | POA: Diagnosis not present

## 2017-11-04 DIAGNOSIS — R079 Chest pain, unspecified: Secondary | ICD-10-CM | POA: Diagnosis not present

## 2017-11-04 DIAGNOSIS — D45 Polycythemia vera: Secondary | ICD-10-CM | POA: Insufficient documentation

## 2017-11-04 DIAGNOSIS — Z23 Encounter for immunization: Secondary | ICD-10-CM

## 2017-11-04 DIAGNOSIS — I10 Essential (primary) hypertension: Secondary | ICD-10-CM

## 2017-11-04 DIAGNOSIS — D473 Essential (hemorrhagic) thrombocythemia: Secondary | ICD-10-CM

## 2017-11-04 DIAGNOSIS — E785 Hyperlipidemia, unspecified: Secondary | ICD-10-CM

## 2017-11-04 DIAGNOSIS — D75839 Thrombocytosis, unspecified: Secondary | ICD-10-CM

## 2017-11-04 DIAGNOSIS — D471 Chronic myeloproliferative disease: Secondary | ICD-10-CM

## 2017-11-04 LAB — CMP (CANCER CENTER ONLY)
ALT: 15 U/L (ref 0–44)
AST: 19 U/L (ref 15–41)
Albumin: 3.6 g/dL (ref 3.5–5.0)
Alkaline Phosphatase: 59 U/L (ref 38–126)
Anion gap: 7 (ref 5–15)
BUN: 14 mg/dL (ref 8–23)
CO2: 27 mmol/L (ref 22–32)
CREATININE: 1.38 mg/dL — AB (ref 0.61–1.24)
Calcium: 9.4 mg/dL (ref 8.9–10.3)
Chloride: 104 mmol/L (ref 98–111)
GFR, Est AFR Am: 53 mL/min — ABNORMAL LOW (ref >60)
GFR, Estimated: 46 mL/min — ABNORMAL LOW (ref >60)
Glucose, Bld: 110 mg/dL — ABNORMAL HIGH (ref 70–99)
POTASSIUM: 5.4 mmol/L — AB (ref 3.5–5.1)
SODIUM: 138 mmol/L (ref 135–145)
Total Bilirubin: 0.6 mg/dL (ref 0.3–1.2)
Total Protein: 7.6 g/dL (ref 6.5–8.1)

## 2017-11-04 LAB — CBC WITH DIFFERENTIAL (CANCER CENTER ONLY)
ABS IMMATURE GRANULOCYTES: 0.03 10*3/uL (ref 0.00–0.07)
Basophils Absolute: 0.2 10*3/uL — ABNORMAL HIGH (ref 0.0–0.1)
Basophils Relative: 2 %
EOS PCT: 3 %
Eosinophils Absolute: 0.3 10*3/uL (ref 0.0–0.5)
HEMATOCRIT: 44.7 % (ref 39.0–52.0)
HEMOGLOBIN: 13.6 g/dL (ref 13.0–17.0)
Immature Granulocytes: 0 %
LYMPHS ABS: 1.4 10*3/uL (ref 0.7–4.0)
LYMPHS PCT: 14 %
MCH: 26.5 pg (ref 26.0–34.0)
MCHC: 30.4 g/dL (ref 30.0–36.0)
MCV: 87.1 fL (ref 80.0–100.0)
MONO ABS: 1.1 10*3/uL — AB (ref 0.1–1.0)
Monocytes Relative: 10 %
NEUTROS ABS: 7.4 10*3/uL (ref 1.7–7.7)
Neutrophils Relative %: 71 %
Platelet Count: 373 10*3/uL (ref 150–400)
RBC: 5.13 MIL/uL (ref 4.22–5.81)
RDW: 14 % (ref 11.5–15.5)
WBC Count: 10.3 10*3/uL (ref 4.0–10.5)
nRBC: 0 % (ref 0.0–0.2)

## 2017-11-04 LAB — LACTATE DEHYDROGENASE: LDH: 161 U/L (ref 98–192)

## 2017-11-04 MED ORDER — INFLUENZA VAC SPLIT QUAD 0.5 ML IM SUSY
0.5000 mL | PREFILLED_SYRINGE | Freq: Once | INTRAMUSCULAR | Status: AC
  Administered 2017-11-04: 0.5 mL via INTRAMUSCULAR

## 2017-11-04 NOTE — Telephone Encounter (Signed)
Gave pt avs and calendar  °

## 2017-11-04 NOTE — Progress Notes (Signed)
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  PRIOR THERAPY: Phlebotomy on as-needed basis.   CURRENT THERAPY: Hydroxyurea 500 mg by mouth daily.   INTERVAL HISTORY: [name] 84 y.o. male returns to the clinic today for six-month follow-up visit.  The patient is feeling fine with no concerning complaints.  He denied having any chest pain, shortness of breath, cough or hemoptysis.  He denied having any fever or chills.  He has no nausea, vomiting, diarrhea or constipation.  He has no recent weight loss or night sweats.  He continues to tolerate his treatment with hydroxyurea fairly well.  The patient is here today for evaluation with repeat blood work.  MEDICAL HISTORY: Past Medical History:  Diagnosis Date  . Chest pain   . Coronary artery disease   . Diabetes mellitus without complication ([place])   . Hyperlipidemia   . Hypertension     ALLERGIES:  has No Known Allergies.  MEDICATIONS:  Current Outpatient Medications  Medication Sig Dispense Refill  . acetaminophen (TYLENOL) 325 MG tablet Take 650 mg by mouth every 6 (six) hours as needed for moderate pain. Reported on 01/23/2015    . aspirin 325 MG tablet Take 325 mg by mouth every morning.     Marland Kitchen CINNAMON PO Take 1 capsule by mouth 2 (two) times daily.    Marland Kitchen gabapentin (NEURONTIN) 100 MG capsule Take 100 mg by mouth 2 (two) times daily.    Marland Kitchen glimepiride (AMARYL) 4 MG tablet Take 4 mg by mouth daily before breakfast.    . HYDROcodone-acetaminophen (NORCO/VICODIN) 5-325 MG per tablet Take 1-2 tablets by mouth every 6 (six) hours as needed for moderate pain or severe pain. 15 tablet 0  . hydrocortisone 2.5 % cream APPLY TOPICALLY TO AFFECTED AREA TO RASH  QD PRN  1  . hydroxyurea (HYDREA) 500 MG capsule  TAKE 1 CAPSULE(500 MG) BY MOUTH DAILY. MAY TAKE WITH FOOD TO MINIMIZE GI SIDE EFFECTS 90 capsule 0  . losartan (COZAAR) 100 MG tablet Take 100 mg by mouth daily.    . metoprolol tartrate (LOPRESSOR) 50 MG tablet Take 1 tablet (50 mg total) by mouth 2 (two) times daily. 180 tablet 2  . Multiple Vitamin (MULTIVITAMIN WITH MINERALS) TABS tablet Take 1 tablet by mouth every morning.    . Omega-3 Fatty Acids (FISH OIL) 1200 MG CAPS Take 1 capsule by mouth 2 (two) times daily.    Marland Kitchen omeprazole (PRILOSEC) 20 MG capsule Take 20 mg by mouth daily.    . simvastatin (ZOCOR) 40 MG tablet Take 40 mg by mouth every evening.     No current facility-administered medications for this visit.     SURGICAL HISTORY:  Past Surgical History:  Procedure Laterality Date  . CARDIAC CATHETERIZATION  11/01/2005   patent stents with normal LV function  . CARDIAC CATHETERIZATION  11/08/2003   cypher stent mid dominant right coronary lesion  . CARDIAC SURGERY    . CORONARY ANGIOPLASTY     post LAD and RCA stenting  . DOPPLER ECHOCARDIOGRAPHY  06/18/2010   EF =>55%,LV normal  . EYE SURGERY    . holter monitor  11/08/2005  . NM MYOCAR PERF WALL MOTION  05/23/2008   EF 62% ,norm myocardial perfusion    REVIEW OF SYSTEMS:  A comprehensive  review of systems was negative.   PHYSICAL EXAMINATION: General appearance: alert, cooperative and no distress Head: Normocephalic, without obvious abnormality, atraumatic Neck: no adenopathy, no JVD, supple, symmetrical, trachea midline and thyroid not enlarged, symmetric, no tenderness/mass/nodules Lymph nodes: Cervical, supraclavicular, and axillary nodes normal. Resp: clear to auscultation bilaterally Back: symmetric, no curvature. ROM normal. No CVA tenderness. Cardio: regular rate and rhythm, S1, S2 normal, no murmur, click, rub or gallop GI: soft, non-tender; bowel sounds normal; no masses,  no organomegaly Extremities: extremities normal, atraumatic, no cyanosis or  edema  ECOG PERFORMANCE STATUS: 1 - Symptomatic but completely ambulatory  Blood pressure 122/73, pulse 60, temperature 98 F (36.7 C), temperature source Oral, resp. rate 14, height 6' (1.829 m), weight 219 lb 12.8 oz (99.7 kg), SpO2 99 %.  LABORATORY DATA: Lab Results  Component Value Date   WBC 10.3 11/04/2017   HGB 13.6 11/04/2017   HCT 44.7 11/04/2017   MCV 87.1 11/04/2017   PLT 373 11/04/2017      Chemistry      Component Value Date/Time   NA 138 05/06/2017 0902   NA 137 10/29/2016 1018   K 4.9 05/06/2017 0902   K 4.9 10/29/2016 1018   CL 105 05/06/2017 0902   CO2 24 05/06/2017 0902   CO2 25 10/29/2016 1018   BUN 13 05/06/2017 0902   BUN 13.5 10/29/2016 1018   CREATININE 1.47 (H) 05/06/2017 0902   CREATININE 1.2 10/29/2016 1018      Component Value Date/Time   CALCIUM 9.8 05/06/2017 0902   CALCIUM 9.1 10/29/2016 1018   ALKPHOS 57 05/06/2017 0902   ALKPHOS 62 10/29/2016 1018   AST 20 05/06/2017 0902   AST 18 10/29/2016 1018   ALT 17 05/06/2017 0902   ALT 12 10/29/2016 1018   BILITOT 0.5 05/06/2017 0902   BILITOT 0.46 10/29/2016 1018       RADIOGRAPHIC STUDIES: No results found.  ASSESSMENT AND PLAN:  This is a very pleasant 84 years old white male with history of myeloproliferative disorder, polycythemia vera with positive JAK 2 mutation. The patient is currently on treatment with hydroxyurea 500 mg by mouth daily. The patient is doing very well today with no concerning complaints. His CBC today is unremarkable for any abnormality. I recommended for the patient to continue his current treatment with hydroxyurea. I will see him back for follow-up visit in 6 months for evaluation with repeat CBC, comprehensive metabolic panel and LDH. The patient will receive flu vaccine in the clinic today. He was advised to call immediately if he has any concerning symptoms in the interval. The patient voices understanding of current disease status and treatment options  and is in agreement with the current care plan. All questions were answered. The patient knows to call the clinic with any problems, questions or concerns. We can certainly see the patient much sooner if necessary. I spent 10 minutes counseling the patient face to face. The total time spent in the appointment was 15 minutes.  Disclaimer: This note was dictated with voice recognition software. Similar sounding words can inadvertently be transcribed and may not be corrected upon review.

## 2017-11-04 NOTE — Addendum Note (Signed)
Addended by: Ardeen Garland on: 11/04/2017 09:48 AM   Modules accepted: Orders

## 2017-11-24 DIAGNOSIS — I10 Essential (primary) hypertension: Secondary | ICD-10-CM | POA: Diagnosis not present

## 2017-11-24 DIAGNOSIS — E7800 Pure hypercholesterolemia, unspecified: Secondary | ICD-10-CM | POA: Diagnosis not present

## 2017-11-24 DIAGNOSIS — E119 Type 2 diabetes mellitus without complications: Secondary | ICD-10-CM | POA: Diagnosis not present

## 2017-12-02 DIAGNOSIS — I10 Essential (primary) hypertension: Secondary | ICD-10-CM | POA: Diagnosis not present

## 2017-12-02 DIAGNOSIS — E785 Hyperlipidemia, unspecified: Secondary | ICD-10-CM | POA: Diagnosis not present

## 2017-12-02 DIAGNOSIS — E119 Type 2 diabetes mellitus without complications: Secondary | ICD-10-CM | POA: Diagnosis not present

## 2017-12-24 DIAGNOSIS — Z08 Encounter for follow-up examination after completed treatment for malignant neoplasm: Secondary | ICD-10-CM | POA: Diagnosis not present

## 2017-12-24 DIAGNOSIS — L570 Actinic keratosis: Secondary | ICD-10-CM | POA: Diagnosis not present

## 2017-12-24 DIAGNOSIS — Z85828 Personal history of other malignant neoplasm of skin: Secondary | ICD-10-CM | POA: Diagnosis not present

## 2017-12-24 DIAGNOSIS — L218 Other seborrheic dermatitis: Secondary | ICD-10-CM | POA: Diagnosis not present

## 2018-01-14 ENCOUNTER — Ambulatory Visit: Payer: Self-pay | Admitting: Cardiovascular Disease

## 2018-02-04 ENCOUNTER — Ambulatory Visit: Payer: Medicare HMO | Admitting: Cardiovascular Disease

## 2018-02-04 ENCOUNTER — Encounter

## 2018-02-04 ENCOUNTER — Encounter: Payer: Self-pay | Admitting: Cardiovascular Disease

## 2018-02-04 DIAGNOSIS — I10 Essential (primary) hypertension: Secondary | ICD-10-CM | POA: Diagnosis not present

## 2018-02-04 DIAGNOSIS — I2510 Atherosclerotic heart disease of native coronary artery without angina pectoris: Secondary | ICD-10-CM

## 2018-02-04 NOTE — Assessment & Plan Note (Signed)
History of CAD status post LAD and RCA stenting in the past.  He was last catheterized by Dr. Melvern Banker in 2007 revealing patent stents with normal LV function.  His last Myoview and 2D echo performed October 2014 which were normal.  He denies chest pain or shortness of breath.

## 2018-02-04 NOTE — Patient Instructions (Signed)
Medication Instructions:  NONE If you need a refill on your cardiac medications before your next appointment, please call your pharmacy.   Lab work: NONE If you have labs (blood work) drawn today and your tests are completely normal, you will receive your results only by: . MyChart Message (if you have MyChart) OR . A paper copy in the mail If you have any lab test that is abnormal or we need to change your treatment, we will call you to review the results.  Testing/Procedures: NONE  Follow-Up: At [facility], you and your health needs are our priority.  As part of our continuing mission to provide you with exceptional heart care, we have created designated Provider Care Teams.  These Care Teams include your primary Cardiologist (physician) and Advanced Practice Providers (APPs -  Physician Assistants and Nurse Practitioners) who all work together to provide you with the care you need, when you need it. . You will need a follow up appointment in 12 months.  Please call our office 2 months in advance to schedule this appointment.  You may see Dr. Berry or one of the following Advanced Practice Providers on your designated Care Team:   . Luke Kilroy, PA-C . Hao Meng, PA-C . Angela Duke, PA-C . Kathryn Lawrence, DNP . Rhonda Barrett, PA-C . Krista Kroeger, PA-C . Callie Goodrich, PA-C   

## 2018-02-04 NOTE — Assessment & Plan Note (Signed)
History of essential hypertension her blood pressure measured today 122/70.  He is on losartan and metoprolol.

## 2018-02-04 NOTE — Progress Notes (Signed)
02/04/2018 [name]   Oct 04, 1932  [id number]  Primary Physician [name], MD Primary Cardiologist: Lorretta Harp MD FACP, [place], [place], [place]  HPI:  [name] is a 85 y.o.  mildly overweight widowed Caucasian male (wife died 2010/09/29 at age 35), father of 38, grandfather to 30 grandchildren who I last sawin the office  01/14/2017 .He has a history of CAD status post LAD and RCA stenting in the past. He was last catheterized by Dr. Melvern Banker in 2007 and found to have patent stents with normal LV function. His other problems include hypertension, hyperlipidemia and diabetes. He does not smoke or drink.Since I saw him 11/26/13 he has been relatively asymptomatic. He had a Myoview stress test and 2-D echo performed in October 2014 year which were normal. He has also gotten engaged since I last saw him and remains engaged to this day. He complains of erectile dysfunction.  Since I saw him a year ago his remained stable.  He does walk with a cane.  Denies chest pain or shortness of breath.    Current Meds  Medication Sig  . acetaminophen (TYLENOL) 325 MG tablet Take 650 mg by mouth every 6 (six) hours as needed for moderate pain. Reported on 01/23/2015  . aspirin 325 MG tablet Take 325 mg by mouth every morning.   Marland Kitchen CINNAMON PO Take 1 capsule by mouth 2 (two) times daily.  Marland Kitchen gabapentin (NEURONTIN) 100 MG capsule Take 100 mg by mouth 2 (two) times daily.  Marland Kitchen glimepiride (AMARYL) 4 MG tablet Take 4 mg by mouth daily before breakfast.  . HYDROcodone-acetaminophen (NORCO/VICODIN) 5-325 MG per tablet Take 1-2 tablets by mouth every 6 (six) hours as needed for moderate pain or severe pain.  . hydrocortisone 2.5 % cream APPLY TOPICALLY TO AFFECTED AREA TO RASH  QD PRN  . hydroxyurea (HYDREA) 500 MG capsule TAKE 1 CAPSULE(500 MG) BY MOUTH DAILY. MAY TAKE WITH FOOD TO MINIMIZE GI SIDE EFFECTS  . losartan (COZAAR) 100 MG tablet Take 100 mg by mouth daily.  . metoprolol tartrate  (LOPRESSOR) 50 MG tablet Take 1 tablet (50 mg total) by mouth 2 (two) times daily.  . Multiple Vitamin (MULTIVITAMIN WITH MINERALS) TABS tablet Take 1 tablet by mouth every morning.  . Omega-3 Fatty Acids (FISH OIL) 1200 MG CAPS Take 1 capsule by mouth 2 (two) times daily.  Marland Kitchen omeprazole (PRILOSEC) 20 MG capsule Take 20 mg by mouth daily.  . simvastatin (ZOCOR) 40 MG tablet Take 40 mg by mouth every evening.     No Known Allergies  Social History   Socioeconomic History  . Marital status: Married    Spouse name: Not on file  . Number of children: 4  . Years of education: 10  . Highest education level: Not on file  Occupational History  . Not on file  Social Needs  . Financial resource strain: Not on file  . Food insecurity:    Worry: Not on file    Inability: Not on file  . Transportation needs:    Medical: Not on file    Non-medical: Not on file  Tobacco Use  . Smoking status: Former Smoker    Types: Cigarettes  . Smokeless tobacco: Former Systems developer    Quit date: 10/31/1982  Substance and Sexual Activity  . Alcohol use: Yes    Comment: 1 glass wine daily  . Drug use: No  . Sexual activity: Never  Lifestyle  . Physical activity:  Days per week: Not on file    Minutes per session: Not on file  . Stress: Not on file  Relationships  . Social connections:    Talks on phone: Not on file    Gets together: Not on file    Attends religious service: Not on file    Active member of club or organization: Not on file    Attends meetings of clubs or organizations: Not on file    Relationship status: Not on file  . Intimate partner violence:    Fear of current or ex partner: Not on file    Emotionally abused: Not on file    Physically abused: Not on file    Forced sexual activity: Not on file  Other Topics Concern  . Not on file  Social History Narrative  . Not on file     Review of Systems: General: negative for chills, fever, night sweats or weight changes.    Cardiovascular: negative for chest pain, dyspnea on exertion, edema, orthopnea, palpitations, paroxysmal nocturnal dyspnea or shortness of breath Dermatological: negative for rash Respiratory: negative for cough or wheezing Urologic: negative for hematuria Abdominal: negative for nausea, vomiting, diarrhea, bright red blood per rectum, melena, or hematemesis Neurologic: negative for visual changes, syncope, or dizziness All other systems reviewed and are otherwise negative except as noted above.    Blood pressure 122/70, pulse 60, height 6' (1.829 m), weight 228 lb (103.4 kg).  General appearance: alert and no distress Neck: no adenopathy, no carotid bruit, no JVD, supple, symmetrical, trachea midline and thyroid not enlarged, symmetric, no tenderness/mass/nodules Lungs: clear to auscultation bilaterally Heart: regular rate and rhythm, S1, S2 normal, no murmur, click, rub or gallop Extremities: extremities normal, atraumatic, no cyanosis or edema Pulses: 2+ and symmetric Skin: Skin color, texture, turgor normal. No rashes or lesions Neurologic: Alert and oriented X 3, normal strength and tone. Normal symmetric reflexes. Normal coordination and gait  EKG sinus rhythm at 60 without ST or T wave changes. I Personally reviewed this EKG.  ASSESSMENT AND PLAN:   HYPERLIPIDEMIA History of hyperlipidemia on statin therapy with lipid profile performed 11/05/2016 revealing total cholesterol 114, HDL of 33.  Essential hypertension History of essential hypertension her blood pressure measured today 122/70.  He is on losartan and metoprolol.  Coronary atherosclerosis History of CAD status post LAD and RCA stenting in the past.  He was last catheterized by Dr. Melvern Banker in 2007 revealing patent stents with normal LV function.  His last Myoview and 2D echo performed October 2014 which were normal.  He denies chest pain or shortness of breath.      Lorretta Harp MD FACP,FACC,FAHA,  [facility] 02/04/2018 10:14 AM

## 2018-02-04 NOTE — Assessment & Plan Note (Signed)
History of hyperlipidemia on statin therapy with lipid profile performed 11/05/2016 revealing total cholesterol 114, HDL of 33.

## 2018-03-23 ENCOUNTER — Other Ambulatory Visit: Payer: Self-pay | Admitting: Cardiovascular Disease

## 2018-03-24 NOTE — Telephone Encounter (Signed)
Rx request sent to pharmacy.  

## 2018-04-15 ENCOUNTER — Other Ambulatory Visit: Payer: Self-pay | Admitting: Medical Oncology

## 2018-04-15 DIAGNOSIS — D473 Essential (hemorrhagic) thrombocythemia: Secondary | ICD-10-CM

## 2018-04-15 DIAGNOSIS — D75839 Thrombocytosis, unspecified: Secondary | ICD-10-CM

## 2018-04-15 MED ORDER — HYDROXYUREA 500 MG PO CAPS
ORAL_CAPSULE | ORAL | 0 refills | Status: DC
Start: 2018-04-15 — End: 2018-07-14

## 2018-04-21 ENCOUNTER — Telehealth: Payer: Self-pay | Admitting: Internal Medicine

## 2018-04-21 NOTE — Telephone Encounter (Signed)
Returning patient's phone call about rescheduling an appointment, due to concerns of COVID-19 per patient's request the appointment has been moved from April to June. Message to provider.

## 2018-05-05 ENCOUNTER — Other Ambulatory Visit: Payer: Self-pay

## 2018-05-05 ENCOUNTER — Telehealth: Payer: Self-pay | Admitting: Internal Medicine

## 2018-05-05 ENCOUNTER — Ambulatory Visit: Payer: Self-pay | Admitting: Internal Medicine

## 2018-05-05 NOTE — Telephone Encounter (Signed)
Returned patient's phone call regarding rescheduling an appointment, per patient's request appointment has been moved from 06/02 to 06/09.

## 2018-05-28 ENCOUNTER — Other Ambulatory Visit: Payer: Self-pay | Admitting: Cardiovascular Disease

## 2018-05-28 NOTE — Telephone Encounter (Signed)
 *  STAT* If patient is at the pharmacy, call can be transferred to refill team.   1. Which medications need to be refilled? (please list name of each medication and dose if known) simvastatin (ZOCOR) 40 MG tablet  2. Which pharmacy/location (including street and city if local pharmacy) is medication to be sent to? [facility]  3. Do they need a 30 day or 90 day supply? 90 day  Patient is completely out of medication. Pt states this is his third time calling to request this.

## 2018-05-29 MED ORDER — SIMVASTATIN 40 MG PO TABS: 40.0000 mg | ORAL_TABLET | Freq: Every evening | ORAL | 2 refills | Status: DC

## 2018-05-29 NOTE — Telephone Encounter (Signed)
Atorvastatin refilled. This is the only refill request that has been sent.

## 2018-06-02 DIAGNOSIS — I10 Essential (primary) hypertension: Secondary | ICD-10-CM | POA: Diagnosis not present

## 2018-06-02 DIAGNOSIS — E119 Type 2 diabetes mellitus without complications: Secondary | ICD-10-CM | POA: Diagnosis not present

## 2018-06-02 DIAGNOSIS — E785 Hyperlipidemia, unspecified: Secondary | ICD-10-CM | POA: Diagnosis not present

## 2018-06-02 DIAGNOSIS — Z0000 Encounter for general adult medical examination without abnormal findings: Secondary | ICD-10-CM | POA: Diagnosis not present

## 2018-06-09 ENCOUNTER — Ambulatory Visit: Payer: Self-pay | Admitting: Internal Medicine

## 2018-06-09 ENCOUNTER — Other Ambulatory Visit: Payer: Self-pay

## 2018-06-09 DIAGNOSIS — I10 Essential (primary) hypertension: Secondary | ICD-10-CM | POA: Diagnosis not present

## 2018-06-09 DIAGNOSIS — M549 Dorsalgia, unspecified: Secondary | ICD-10-CM | POA: Diagnosis not present

## 2018-06-09 DIAGNOSIS — E119 Type 2 diabetes mellitus without complications: Secondary | ICD-10-CM | POA: Diagnosis not present

## 2018-06-09 DIAGNOSIS — D509 Iron deficiency anemia, unspecified: Secondary | ICD-10-CM | POA: Diagnosis not present

## 2018-06-09 DIAGNOSIS — G629 Polyneuropathy, unspecified: Secondary | ICD-10-CM | POA: Diagnosis not present

## 2018-06-09 DIAGNOSIS — K219 Gastro-esophageal reflux disease without esophagitis: Secondary | ICD-10-CM | POA: Diagnosis not present

## 2018-06-09 DIAGNOSIS — E039 Hypothyroidism, unspecified: Secondary | ICD-10-CM | POA: Diagnosis not present

## 2018-06-09 DIAGNOSIS — Z0000 Encounter for general adult medical examination without abnormal findings: Secondary | ICD-10-CM | POA: Diagnosis not present

## 2018-06-09 DIAGNOSIS — E785 Hyperlipidemia, unspecified: Secondary | ICD-10-CM | POA: Diagnosis not present

## 2018-06-09 DIAGNOSIS — D751 Secondary polycythemia: Secondary | ICD-10-CM | POA: Diagnosis not present

## 2018-06-16 ENCOUNTER — Other Ambulatory Visit: Payer: Self-pay

## 2018-06-16 ENCOUNTER — Inpatient Hospital Stay: Payer: Medicare HMO | Attending: Internal Medicine

## 2018-06-16 ENCOUNTER — Inpatient Hospital Stay (HOSPITAL_BASED_OUTPATIENT_CLINIC_OR_DEPARTMENT_OTHER): Payer: Medicare HMO | Admitting: Internal Medicine

## 2018-06-16 ENCOUNTER — Encounter: Payer: Self-pay | Admitting: Internal Medicine

## 2018-06-16 VITALS — BP 132/62 | HR 68 | Temp 98.5°F | Resp 18 | Ht 73.0 in | Wt 214.7 lb

## 2018-06-16 DIAGNOSIS — D75839 Thrombocytosis, unspecified: Secondary | ICD-10-CM

## 2018-06-16 DIAGNOSIS — D45 Polycythemia vera: Secondary | ICD-10-CM | POA: Diagnosis not present

## 2018-06-16 DIAGNOSIS — Z7982 Long term (current) use of aspirin: Secondary | ICD-10-CM

## 2018-06-16 DIAGNOSIS — D471 Chronic myeloproliferative disease: Secondary | ICD-10-CM

## 2018-06-16 DIAGNOSIS — Z79899 Other long term (current) drug therapy: Secondary | ICD-10-CM | POA: Diagnosis not present

## 2018-06-16 DIAGNOSIS — E119 Type 2 diabetes mellitus without complications: Secondary | ICD-10-CM

## 2018-06-16 DIAGNOSIS — I2510 Atherosclerotic heart disease of native coronary artery without angina pectoris: Secondary | ICD-10-CM | POA: Diagnosis not present

## 2018-06-16 DIAGNOSIS — I10 Essential (primary) hypertension: Secondary | ICD-10-CM | POA: Diagnosis not present

## 2018-06-16 DIAGNOSIS — R079 Chest pain, unspecified: Secondary | ICD-10-CM | POA: Insufficient documentation

## 2018-06-16 DIAGNOSIS — R5383 Other fatigue: Secondary | ICD-10-CM | POA: Insufficient documentation

## 2018-06-16 DIAGNOSIS — M2550 Pain in unspecified joint: Secondary | ICD-10-CM

## 2018-06-16 DIAGNOSIS — E785 Hyperlipidemia, unspecified: Secondary | ICD-10-CM

## 2018-06-16 DIAGNOSIS — D473 Essential (hemorrhagic) thrombocythemia: Secondary | ICD-10-CM

## 2018-06-16 LAB — CMP (CANCER CENTER ONLY)
ALT: 12 U/L (ref 0–44)
AST: 16 U/L (ref 15–41)
Albumin: 3.7 g/dL (ref 3.5–5.0)
Alkaline Phosphatase: 57 U/L (ref 38–126)
Anion gap: 8 (ref 5–15)
BUN: 11 mg/dL (ref 8–23)
CO2: 25 mmol/L (ref 22–32)
Calcium: 9.2 mg/dL (ref 8.9–10.3)
Chloride: 104 mmol/L (ref 98–111)
Creatinine: 1.46 mg/dL — ABNORMAL HIGH (ref 0.61–1.24)
GFR, Est AFR Am: 51 mL/min — ABNORMAL LOW (ref >60)
GFR, Estimated: 44 mL/min — ABNORMAL LOW (ref >60)
Glucose, Bld: 102 mg/dL — ABNORMAL HIGH (ref 70–99)
Potassium: 5.1 mmol/L (ref 3.5–5.1)
Sodium: 137 mmol/L (ref 135–145)
Total Bilirubin: 0.6 mg/dL (ref 0.3–1.2)
Total Protein: 7.7 g/dL (ref 6.5–8.1)

## 2018-06-16 LAB — CBC WITH DIFFERENTIAL (CANCER CENTER ONLY)
Abs Immature Granulocytes: 0.04 10*3/uL (ref 0.00–0.07)
Basophils Absolute: 0.2 10*3/uL — ABNORMAL HIGH (ref 0.0–0.1)
Basophils Relative: 2 %
Eosinophils Absolute: 0.2 10*3/uL (ref 0.0–0.5)
Eosinophils Relative: 2 %
HCT: 47.9 % (ref 39.0–52.0)
Hemoglobin: 14.3 g/dL (ref 13.0–17.0)
Immature Granulocytes: 0 %
Lymphocytes Relative: 15 %
Lymphs Abs: 1.4 10*3/uL (ref 0.7–4.0)
MCH: 26.4 pg (ref 26.0–34.0)
MCHC: 29.9 g/dL — ABNORMAL LOW (ref 30.0–36.0)
MCV: 88.4 fL (ref 80.0–100.0)
Monocytes Absolute: 0.9 10*3/uL (ref 0.1–1.0)
Monocytes Relative: 9 %
Neutro Abs: 6.6 10*3/uL (ref 1.7–7.7)
Neutrophils Relative %: 72 %
Platelet Count: 446 10*3/uL — ABNORMAL HIGH (ref 150–400)
RBC: 5.42 MIL/uL (ref 4.22–5.81)
RDW: 15.1 % (ref 11.5–15.5)
WBC Count: 9.3 10*3/uL (ref 4.0–10.5)
nRBC: 0 % (ref 0.0–0.2)

## 2018-06-16 LAB — LACTATE DEHYDROGENASE: LDH: 149 U/L (ref 98–192)

## 2018-06-16 NOTE — Progress Notes (Signed)
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  PRIOR THERAPY: Phlebotomy on as-needed basis.   CURRENT THERAPY: Hydroxyurea 500 mg by mouth daily.   INTERVAL HISTORY: [name] 85 y.o. male returns to the clinic today for 6 months follow-up visit.  The patient is feeling fine today with no concerning complaints except for mild fatigue and arthralgia.  He denied having any chest pain, shortness of breath, cough or hemoptysis.  He denied having any fever or chills.  He has no nausea, vomiting, diarrhea or constipation.  He continues to tolerate his treatment with hydroxyurea fairly well.  The patient is here today for evaluation and repeat blood work.  MEDICAL HISTORY: Past Medical History:  Diagnosis Date  . Chest pain   . Coronary artery disease   . Diabetes mellitus without complication ([place])   . Hyperlipidemia   . Hypertension     ALLERGIES:  has No Known Allergies.  MEDICATIONS:  Current Outpatient Medications  Medication Sig Dispense Refill  . acetaminophen (TYLENOL) 325 MG tablet Take 650 mg by mouth every 6 (six) hours as needed for moderate pain. Reported on 01/23/2015    . aspirin 325 MG tablet Take 325 mg by mouth every morning.     Marland Kitchen CINNAMON PO Take 1 capsule by mouth 2 (two) times daily.    Marland Kitchen gabapentin (NEURONTIN) 100 MG capsule Take 100 mg by mouth 2 (two) times daily.    Marland Kitchen glimepiride (AMARYL) 4 MG tablet Take 4 mg by mouth daily before breakfast.    . hydrocortisone 2.5 % cream APPLY TOPICALLY TO AFFECTED AREA TO RASH  QD PRN  1  . hydroxyurea (HYDREA) 500 MG capsule TAKE 1 CAPSULE(500 MG) BY MOUTH DAILY. MAY TAKE WITH FOOD TO MINIMIZE GI SIDE EFFECTS 90 capsule 0  . losartan (COZAAR) 100 MG tablet Take 100 mg by mouth daily.    .  metoprolol tartrate (LOPRESSOR) 50 MG tablet Take 1 tablet by mouth twice daily 180 tablet 0  . Multiple Vitamin (MULTIVITAMIN WITH MINERALS) TABS tablet Take 1 tablet by mouth every morning.    . Omega-3 Fatty Acids (FISH OIL) 1200 MG CAPS Take 1 capsule by mouth 2 (two) times daily.    Marland Kitchen omeprazole (PRILOSEC) 20 MG capsule Take 20 mg by mouth daily.    . simvastatin (ZOCOR) 40 MG tablet Take 1 tablet (40 mg total) by mouth every evening. 90 tablet 2   No current facility-administered medications for this visit.     SURGICAL HISTORY:  Past Surgical History:  Procedure Laterality Date  . CARDIAC CATHETERIZATION  11/01/2005   patent stents with normal LV function  . CARDIAC CATHETERIZATION  11/08/2003   cypher stent mid dominant right coronary lesion  . CARDIAC SURGERY    . CORONARY ANGIOPLASTY     post LAD and RCA stenting  . DOPPLER ECHOCARDIOGRAPHY  06/18/2010   EF =>55%,LV normal  . EYE SURGERY    . holter monitor  11/08/2005  . NM MYOCAR PERF WALL MOTION  05/23/2008   EF 62% ,norm myocardial perfusion    REVIEW OF SYSTEMS:  A comprehensive review of systems was negative except for: Constitutional: positive for fatigue Musculoskeletal: positive for arthralgias   PHYSICAL EXAMINATION: General appearance: alert, cooperative and no distress Head: Normocephalic, without obvious abnormality,  atraumatic Neck: no adenopathy, no JVD, supple, symmetrical, trachea midline and thyroid not enlarged, symmetric, no tenderness/mass/nodules Lymph nodes: Cervical, supraclavicular, and axillary nodes normal. Resp: clear to auscultation bilaterally Back: symmetric, no curvature. ROM normal. No CVA tenderness. Cardio: regular rate and rhythm, S1, S2 normal, no murmur, click, rub or gallop GI: soft, non-tender; bowel sounds normal; no masses,  no organomegaly Extremities: extremities normal, atraumatic, no cyanosis or edema  ECOG PERFORMANCE STATUS: 1 - Symptomatic but completely ambulatory   Blood pressure 132/62, pulse 68, temperature 98.5 F (36.9 C), temperature source Oral, resp. rate 18, height 6' (1.829 m), weight 214 lb 11.2 oz (97.4 kg), SpO2 98 %.  LABORATORY DATA: Lab Results  Component Value Date   WBC 9.3 06/16/2018   HGB 14.3 06/16/2018   HCT 47.9 06/16/2018   MCV 88.4 06/16/2018   PLT 446 (H) 06/16/2018      Chemistry      Component Value Date/Time   NA 138 11/04/2017 0901   NA 137 10/29/2016 1018   K 5.4 (H) 11/04/2017 0901   K 4.9 10/29/2016 1018   CL 104 11/04/2017 0901   CO2 27 11/04/2017 0901   CO2 25 10/29/2016 1018   BUN 14 11/04/2017 0901   BUN 13.5 10/29/2016 1018   CREATININE 1.38 (H) 11/04/2017 0901   CREATININE 1.2 10/29/2016 1018      Component Value Date/Time   CALCIUM 9.4 11/04/2017 0901   CALCIUM 9.1 10/29/2016 1018   ALKPHOS 59 11/04/2017 0901   ALKPHOS 62 10/29/2016 1018   AST 19 11/04/2017 0901   AST 18 10/29/2016 1018   ALT 15 11/04/2017 0901   ALT 12 10/29/2016 1018   BILITOT 0.6 11/04/2017 0901   BILITOT 0.46 10/29/2016 1018       RADIOGRAPHIC STUDIES: No results found.  ASSESSMENT AND PLAN:  This is a very pleasant 85 years old white male with history of myeloproliferative disorder, polycythemia vera with positive JAK 2 mutation. The patient is currently on treatment with hydroxyurea 500 mg by mouth daily. The patient has no complaints today except for mild fatigue and arthralgia.  He continues to tolerate his treatment with hydroxyurea fairly well. CBC today showed no concerning findings except for mild thrombocythemia. I recommended for the patient to continue his current treatment with hydroxyurea with the same dose. I will see him back for follow-up visit in 6 months with repeat CBC, LDH and comprehensive metabolic panel. The patient voices understanding of current disease status and treatment options and is in agreement with the current care plan. All questions were answered. The patient knows to call the  clinic with any problems, questions or concerns. We can certainly see the patient much sooner if necessary. I spent 10 minutes counseling the patient face to face. The total time spent in the appointment was 15 minutes.  Disclaimer: This note was dictated with voice recognition software. Similar sounding words can inadvertently be transcribed and may not be corrected upon review.

## 2018-06-17 ENCOUNTER — Telehealth: Payer: Self-pay | Admitting: Internal Medicine

## 2018-06-17 NOTE — Telephone Encounter (Signed)
Scheduled appt per 6/09 los - f/u in 6 motnhs. Mailed letter with appt date and time

## 2018-06-22 ENCOUNTER — Other Ambulatory Visit: Payer: Self-pay | Admitting: Cardiovascular Disease

## 2018-06-23 ENCOUNTER — Other Ambulatory Visit: Payer: Self-pay

## 2018-06-23 ENCOUNTER — Telehealth: Payer: Self-pay | Admitting: Cardiovascular Disease

## 2018-06-23 MED ORDER — SIMVASTATIN 40 MG PO TABS: 40.0000 mg | ORAL_TABLET | Freq: Every evening | ORAL | 3 refills | Status: DC

## 2018-06-23 NOTE — Telephone Encounter (Signed)
RX sent in to the [facility] per pt request for his Simvastatin. 90 day supply.

## 2018-06-23 NOTE — Telephone Encounter (Signed)
New Message             Patient is calling to see if he can get a 90 day supply of  "Simvastation", patient states that's the norm for him. He thinks because it was sent to the wrong place is why they were only giving him 30 days, but his request is coming from [place] for 90. Pls call and advise.

## 2018-07-06 DIAGNOSIS — Z08 Encounter for follow-up examination after completed treatment for malignant neoplasm: Secondary | ICD-10-CM | POA: Diagnosis not present

## 2018-07-06 DIAGNOSIS — Z85828 Personal history of other malignant neoplasm of skin: Secondary | ICD-10-CM | POA: Diagnosis not present

## 2018-07-06 DIAGNOSIS — D1801 Hemangioma of skin and subcutaneous tissue: Secondary | ICD-10-CM | POA: Diagnosis not present

## 2018-07-06 DIAGNOSIS — C44311 Basal cell carcinoma of skin of nose: Secondary | ICD-10-CM | POA: Diagnosis not present

## 2018-07-06 DIAGNOSIS — L821 Other seborrheic keratosis: Secondary | ICD-10-CM | POA: Diagnosis not present

## 2018-07-06 DIAGNOSIS — D485 Neoplasm of uncertain behavior of skin: Secondary | ICD-10-CM | POA: Diagnosis not present

## 2018-07-06 DIAGNOSIS — L570 Actinic keratosis: Secondary | ICD-10-CM | POA: Diagnosis not present

## 2018-07-06 DIAGNOSIS — L218 Other seborrheic dermatitis: Secondary | ICD-10-CM | POA: Diagnosis not present

## 2018-07-06 DIAGNOSIS — C44519 Basal cell carcinoma of skin of other part of trunk: Secondary | ICD-10-CM | POA: Diagnosis not present

## 2018-07-14 ENCOUNTER — Other Ambulatory Visit: Payer: Self-pay | Admitting: Medical Oncology

## 2018-07-14 DIAGNOSIS — D473 Essential (hemorrhagic) thrombocythemia: Secondary | ICD-10-CM

## 2018-07-14 DIAGNOSIS — D75839 Thrombocytosis, unspecified: Secondary | ICD-10-CM

## 2018-07-14 MED ORDER — HYDROXYUREA 500 MG PO CAPS: ORAL_CAPSULE | ORAL | 0 refills | Status: DC

## 2018-09-17 DIAGNOSIS — R69 Illness, unspecified: Secondary | ICD-10-CM | POA: Diagnosis not present

## 2018-10-06 DIAGNOSIS — C44311 Basal cell carcinoma of skin of nose: Secondary | ICD-10-CM | POA: Diagnosis not present

## 2018-10-12 ENCOUNTER — Other Ambulatory Visit: Payer: Self-pay | Admitting: Internal Medicine

## 2018-10-12 DIAGNOSIS — D473 Essential (hemorrhagic) thrombocythemia: Secondary | ICD-10-CM

## 2018-10-12 DIAGNOSIS — D75839 Thrombocytosis, unspecified: Secondary | ICD-10-CM

## 2018-10-14 DIAGNOSIS — L851 Acquired keratosis [keratoderma] palmaris et plantaris: Secondary | ICD-10-CM | POA: Diagnosis not present

## 2018-10-14 DIAGNOSIS — L83 Acanthosis nigricans: Secondary | ICD-10-CM | POA: Diagnosis not present

## 2018-10-22 ENCOUNTER — Telehealth: Payer: Self-pay | Admitting: Medical Oncology

## 2018-10-22 NOTE — Telephone Encounter (Signed)
Refills at dec appt -Can he get an additional  refill on the Hydrea #90 since he only sees Mohamed every 6 months or change quantity to 180 pills at the next visit. I told him Julien Nordmann can address at next visit.

## 2018-12-17 ENCOUNTER — Inpatient Hospital Stay: Payer: Medicare HMO

## 2018-12-17 ENCOUNTER — Encounter: Payer: Self-pay | Admitting: Internal Medicine

## 2018-12-17 ENCOUNTER — Telehealth: Payer: Self-pay | Admitting: Internal Medicine

## 2018-12-17 ENCOUNTER — Inpatient Hospital Stay: Payer: Medicare HMO | Attending: Internal Medicine | Admitting: Internal Medicine

## 2018-12-17 ENCOUNTER — Other Ambulatory Visit: Payer: Self-pay

## 2018-12-17 VITALS — BP 129/68 | HR 65 | Temp 98.5°F | Resp 18 | Ht 73.0 in | Wt 212.6 lb

## 2018-12-17 DIAGNOSIS — D45 Polycythemia vera: Secondary | ICD-10-CM | POA: Insufficient documentation

## 2018-12-17 DIAGNOSIS — D473 Essential (hemorrhagic) thrombocythemia: Secondary | ICD-10-CM | POA: Diagnosis not present

## 2018-12-17 DIAGNOSIS — I2510 Atherosclerotic heart disease of native coronary artery without angina pectoris: Secondary | ICD-10-CM | POA: Insufficient documentation

## 2018-12-17 DIAGNOSIS — E785 Hyperlipidemia, unspecified: Secondary | ICD-10-CM | POA: Diagnosis not present

## 2018-12-17 DIAGNOSIS — M2550 Pain in unspecified joint: Secondary | ICD-10-CM | POA: Diagnosis not present

## 2018-12-17 DIAGNOSIS — E119 Type 2 diabetes mellitus without complications: Secondary | ICD-10-CM | POA: Insufficient documentation

## 2018-12-17 DIAGNOSIS — Z7982 Long term (current) use of aspirin: Secondary | ICD-10-CM | POA: Diagnosis not present

## 2018-12-17 DIAGNOSIS — Z79899 Other long term (current) drug therapy: Secondary | ICD-10-CM | POA: Insufficient documentation

## 2018-12-17 DIAGNOSIS — I10 Essential (primary) hypertension: Secondary | ICD-10-CM | POA: Diagnosis not present

## 2018-12-17 DIAGNOSIS — D75839 Thrombocytosis, unspecified: Secondary | ICD-10-CM

## 2018-12-17 LAB — CBC WITH DIFFERENTIAL (CANCER CENTER ONLY)
Abs Immature Granulocytes: 0.04 10*3/uL (ref 0.00–0.07)
Basophils Absolute: 0.2 10*3/uL — ABNORMAL HIGH (ref 0.0–0.1)
Basophils Relative: 2 %
Eosinophils Absolute: 0.1 10*3/uL (ref 0.0–0.5)
Eosinophils Relative: 1 %
HCT: 46.5 % (ref 39.0–52.0)
Hemoglobin: 14.3 g/dL (ref 13.0–17.0)
Immature Granulocytes: 0 %
Lymphocytes Relative: 12 %
Lymphs Abs: 1.2 10*3/uL (ref 0.7–4.0)
MCH: 26.9 pg (ref 26.0–34.0)
MCHC: 30.8 g/dL (ref 30.0–36.0)
MCV: 87.4 fL (ref 80.0–100.0)
Monocytes Absolute: 0.7 10*3/uL (ref 0.1–1.0)
Monocytes Relative: 8 %
Neutro Abs: 7.4 10*3/uL (ref 1.7–7.7)
Neutrophils Relative %: 77 %
Platelet Count: 402 10*3/uL — ABNORMAL HIGH (ref 150–400)
RBC: 5.32 MIL/uL (ref 4.22–5.81)
RDW: 14.5 % (ref 11.5–15.5)
WBC Count: 9.6 10*3/uL (ref 4.0–10.5)
nRBC: 0 % (ref 0.0–0.2)

## 2018-12-17 LAB — CMP (CANCER CENTER ONLY)
ALT: 13 U/L (ref 0–44)
AST: 16 U/L (ref 15–41)
Albumin: 3.7 g/dL (ref 3.5–5.0)
Alkaline Phosphatase: 58 U/L (ref 38–126)
Anion gap: 7 (ref 5–15)
BUN: 14 mg/dL (ref 8–23)
CO2: 27 mmol/L (ref 22–32)
Calcium: 9 mg/dL (ref 8.9–10.3)
Chloride: 102 mmol/L (ref 98–111)
Creatinine: 1.33 mg/dL — ABNORMAL HIGH (ref 0.61–1.24)
GFR, Est AFR Am: 57 mL/min — ABNORMAL LOW (ref >60)
GFR, Estimated: 49 mL/min — ABNORMAL LOW (ref >60)
Glucose, Bld: 125 mg/dL — ABNORMAL HIGH (ref 70–99)
Potassium: 4.7 mmol/L (ref 3.5–5.1)
Sodium: 136 mmol/L (ref 135–145)
Total Bilirubin: 0.6 mg/dL (ref 0.3–1.2)
Total Protein: 7.8 g/dL (ref 6.5–8.1)

## 2018-12-17 LAB — LACTATE DEHYDROGENASE: LDH: 152 U/L (ref 98–192)

## 2018-12-17 NOTE — Progress Notes (Signed)
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  PRIOR THERAPY: Phlebotomy on as-needed basis.   CURRENT THERAPY: Hydroxyurea 500 mg by mouth daily.   INTERVAL HISTORY: [name] 85 y.o. male returns to the clinic today for follow-up visit.  The patient is feeling fine today with no concerning complaints.  He denied having any current chest pain, shortness of breath, cough or hemoptysis.  He denied having any nausea, vomiting, diarrhea or constipation.  He has no bleeding, bruises or ecchymosis.  He denied having any significant weight loss or night sweats.  He has been tolerating his treatment with hydroxyurea fairly well.  The patient is here today for evaluation and repeat blood work for close monitoring of his condition.   MEDICAL HISTORY: Past Medical History:  Diagnosis Date  . Chest pain   . Coronary artery disease   . Diabetes mellitus without complication ([place])   . Hyperlipidemia   . Hypertension     ALLERGIES:  has No Known Allergies.  MEDICATIONS:  Current Outpatient Medications  Medication Sig Dispense Refill  . acetaminophen (TYLENOL) 325 MG tablet Take 650 mg by mouth every 6 (six) hours as needed for moderate pain. Reported on 01/23/2015    . aspirin 325 MG tablet Take 325 mg by mouth every morning.     Marland Kitchen CINNAMON PO Take 1 capsule by mouth 2 (two) times daily.    Marland Kitchen gabapentin (NEURONTIN) 100 MG capsule Take 100 mg by mouth 2 (two) times daily.    Marland Kitchen glimepiride (AMARYL) 4 MG tablet Take 4 mg by mouth daily before breakfast.    . hydrocortisone 2.5 % cream APPLY TOPICALLY TO AFFECTED AREA TO RASH  QD PRN  1  . hydroxyurea (HYDREA) 500 MG capsule TAKE 1 CAPSULE BY MOUTH ONCE DAILY. MAY TAKE WITH FOOD TO MINIMIZE GI EFFECTS. 90 capsule 0  .  losartan (COZAAR) 100 MG tablet Take 100 mg by mouth daily.    . metoprolol tartrate (LOPRESSOR) 50 MG tablet Take 1 tablet by mouth twice daily 180 tablet 1  . Multiple Vitamin (MULTIVITAMIN WITH MINERALS) TABS tablet Take 1 tablet by mouth every morning.    . Omega-3 Fatty Acids (FISH OIL) 1200 MG CAPS Take 1 capsule by mouth 2 (two) times daily.    Marland Kitchen omeprazole (PRILOSEC) 20 MG capsule Take 20 mg by mouth daily.    . simvastatin (ZOCOR) 40 MG tablet Take 1 tablet (40 mg total) by mouth every evening. 90 tablet 3   No current facility-administered medications for this visit.    SURGICAL HISTORY:  Past Surgical History:  Procedure Laterality Date  . CARDIAC CATHETERIZATION  11/01/2005   patent stents with normal LV function  . CARDIAC CATHETERIZATION  11/08/2003   cypher stent mid dominant right coronary lesion  . CARDIAC SURGERY    . CORONARY ANGIOPLASTY     post LAD and RCA stenting  . DOPPLER ECHOCARDIOGRAPHY  06/18/2010   EF =>55%,LV normal  . EYE SURGERY    . holter monitor  11/08/2005  . NM MYOCAR PERF WALL MOTION  05/23/2008   EF 62% ,norm myocardial perfusion    REVIEW OF SYSTEMS:  A comprehensive review of systems was negative except for: Constitutional: positive for fatigue Musculoskeletal: positive for arthralgias   PHYSICAL EXAMINATION: General appearance:  alert, cooperative and no distress Head: Normocephalic, without obvious abnormality, atraumatic Neck: no adenopathy, no JVD, supple, symmetrical, trachea midline and thyroid not enlarged, symmetric, no tenderness/mass/nodules Lymph nodes: Cervical, supraclavicular, and axillary nodes normal. Resp: clear to auscultation bilaterally Back: symmetric, no curvature. ROM normal. No CVA tenderness. Cardio: regular rate and rhythm, S1, S2 normal, no murmur, click, rub or gallop GI: soft, non-tender; bowel sounds normal; no masses,  no organomegaly Extremities: extremities normal, atraumatic, no cyanosis or edema  ECOG  PERFORMANCE STATUS: 1 - Symptomatic but completely ambulatory  Blood pressure 129/68, pulse 65, temperature 98.5 F (36.9 C), temperature source Temporal, resp. rate 18, height 6\' 1"  (1.854 m), weight 212 lb 9.6 oz (96.4 kg), SpO2 99 %.  LABORATORY DATA: Lab Results  Component Value Date   WBC 9.6 12/17/2018   HGB 14.3 12/17/2018   HCT 46.5 12/17/2018   MCV 87.4 12/17/2018   PLT 402 (H) 12/17/2018      Chemistry      Component Value Date/Time   NA 136 12/17/2018 1011   NA 137 10/29/2016 1018   K 4.7 12/17/2018 1011   K 4.9 10/29/2016 1018   CL 102 12/17/2018 1011   CO2 27 12/17/2018 1011   CO2 25 10/29/2016 1018   BUN 14 12/17/2018 1011   BUN 13.5 10/29/2016 1018   CREATININE 1.33 (H) 12/17/2018 1011   CREATININE 1.2 10/29/2016 1018      Component Value Date/Time   CALCIUM 9.0 12/17/2018 1011   CALCIUM 9.1 10/29/2016 1018   ALKPHOS 58 12/17/2018 1011   ALKPHOS 62 10/29/2016 1018   AST 16 12/17/2018 1011   AST 18 10/29/2016 1018   ALT 13 12/17/2018 1011   ALT 12 10/29/2016 1018   BILITOT 0.6 12/17/2018 1011   BILITOT 0.46 10/29/2016 1018       RADIOGRAPHIC STUDIES: No results found.  ASSESSMENT AND PLAN:  This is a very pleasant 85 years old white male with history of myeloproliferative disorder, polycythemia vera with positive JAK 2 mutation. The patient is currently on treatment with hydroxyurea 500 mg by mouth daily. The patient is feeling fine today with no concerning complaints except for mild fatigue and arthralgia. His CBC today showed acceptable platelet count as well as normal hemoglobin, hematocrit and white blood count. I recommended for him to continue his current treatment with hydroxyurea. I will see him back for follow-up visit in 6 months for evaluation with repeat blood work. He was advised to call immediately if he has any concerning symptoms in the interval. The patient voices understanding of current disease status and treatment options and  is in agreement with the current care plan. All questions were answered. The patient knows to call the clinic with any problems, questions or concerns. We can certainly see the patient much sooner if necessary. I spent 10 minutes counseling the patient face to face. The total time spent in the appointment was 15 minutes.  Disclaimer: This note was dictated with voice recognition software. Similar sounding words can inadvertently be transcribed and may not be corrected upon review.

## 2018-12-17 NOTE — Telephone Encounter (Signed)
Scheduled appt per 12/10 los - gave patient avs and calender per los

## 2018-12-21 ENCOUNTER — Other Ambulatory Visit: Payer: Self-pay | Admitting: Cardiovascular Disease

## 2018-12-22 DIAGNOSIS — Z0000 Encounter for general adult medical examination without abnormal findings: Secondary | ICD-10-CM | POA: Diagnosis not present

## 2018-12-22 DIAGNOSIS — I10 Essential (primary) hypertension: Secondary | ICD-10-CM | POA: Diagnosis not present

## 2018-12-22 DIAGNOSIS — R739 Hyperglycemia, unspecified: Secondary | ICD-10-CM | POA: Diagnosis not present

## 2018-12-22 DIAGNOSIS — E785 Hyperlipidemia, unspecified: Secondary | ICD-10-CM | POA: Diagnosis not present

## 2018-12-23 ENCOUNTER — Other Ambulatory Visit: Payer: Self-pay | Admitting: *Deleted

## 2018-12-29 DIAGNOSIS — I10 Essential (primary) hypertension: Secondary | ICD-10-CM | POA: Diagnosis not present

## 2018-12-29 DIAGNOSIS — E785 Hyperlipidemia, unspecified: Secondary | ICD-10-CM | POA: Diagnosis not present

## 2018-12-29 DIAGNOSIS — E119 Type 2 diabetes mellitus without complications: Secondary | ICD-10-CM | POA: Diagnosis not present

## 2018-12-29 DIAGNOSIS — E875 Hyperkalemia: Secondary | ICD-10-CM | POA: Diagnosis not present

## 2019-01-05 ENCOUNTER — Ambulatory Visit (INDEPENDENT_AMBULATORY_CARE_PROVIDER_SITE_OTHER): Payer: Medicare HMO | Admitting: Cardiovascular Disease

## 2019-01-05 ENCOUNTER — Other Ambulatory Visit: Payer: Self-pay

## 2019-01-05 ENCOUNTER — Encounter: Payer: Self-pay | Admitting: Cardiovascular Disease

## 2019-01-05 VITALS — BP 139/76 | HR 65 | Temp 95.0°F | Ht 73.0 in | Wt 216.0 lb

## 2019-01-05 DIAGNOSIS — I10 Essential (primary) hypertension: Secondary | ICD-10-CM

## 2019-01-05 DIAGNOSIS — I2510 Atherosclerotic heart disease of native coronary artery without angina pectoris: Secondary | ICD-10-CM | POA: Diagnosis not present

## 2019-01-05 NOTE — Assessment & Plan Note (Signed)
History of essential hypertension blood pressure measured today at 139/76.  He is on losartan and metoprolol.

## 2019-01-05 NOTE — Progress Notes (Signed)
01/05/2019 [name]   06-Dec-1932  [id number]  Primary Physician [name], MD Primary Cardiologist: Lorretta Harp MD FACP, [place], [place], [place]  HPI:  [name] is a 85 y.o.  mildly overweight widowed Caucasian male (wife died 09-26-10 at age 45), father of 29, grandfather to 62 grandchildren who I last sawin the office  02/04/2018.He has a history of CAD status post LAD and RCA stenting in the past. He was last catheterized by Dr. Melvern Banker in 2007 and found to have patent stents with normal LV function. His other problems include hypertension, hyperlipidemia and diabetes. He does not smoke or drink.Since I saw him 11/26/13 he has been relatively asymptomatic. He had a Myoview stress test and 2-D echo performed in October 2014 year which were normal. He has also gotten engaged since I last saw him and remains engaged to this day. He complains of erectile dysfunction.  Since I saw him a year ago his remained stable.  He does walk with a cane.  Denies chest pain or shortness of breath.   Current Meds  Medication Sig  . acetaminophen (TYLENOL) 325 MG tablet Take 650 mg by mouth every 6 (six) hours as needed for moderate pain. Reported on 01/23/2015  . aspirin 325 MG tablet Take 325 mg by mouth every morning.   Marland Kitchen CINNAMON PO Take 1 capsule by mouth 2 (two) times daily.  . clobetasol cream (TEMOVATE) 0.05 %   . gabapentin (NEURONTIN) 100 MG capsule Take 100 mg by mouth 2 (two) times daily.  Marland Kitchen glimepiride (AMARYL) 4 MG tablet Take 4 mg by mouth daily before breakfast.  . hydrocortisone 2.5 % cream APPLY TOPICALLY TO AFFECTED AREA TO RASH  QD PRN  . hydroxyurea (HYDREA) 500 MG capsule TAKE 1 CAPSULE BY MOUTH ONCE DAILY. MAY TAKE WITH FOOD TO MINIMIZE GI EFFECTS.  Marland Kitchen losartan (COZAAR) 100 MG tablet Take 100 mg by mouth daily.  . metoprolol tartrate (LOPRESSOR) 50 MG tablet Take 1 tablet by mouth twice daily  . Multiple Vitamin (MULTIVITAMIN WITH MINERALS) TABS tablet Take 1  tablet by mouth every morning.  . Omega-3 Fatty Acids (FISH OIL) 1200 MG CAPS Take 1 capsule by mouth 2 (two) times daily.  Marland Kitchen omeprazole (PRILOSEC) 20 MG capsule Take 40 mg by mouth daily.   . simvastatin (ZOCOR) 40 MG tablet Take 1 tablet (40 mg total) by mouth every evening.     No Known Allergies  Social History   Socioeconomic History  . Marital status: Married    Spouse name: Not on file  . Number of children: 4  . Years of education: 83  . Highest education level: Not on file  Occupational History  . Not on file  Tobacco Use  . Smoking status: Former Smoker    Types: Cigarettes  . Smokeless tobacco: Former Systems developer    Quit date: 10/31/1982  Substance and Sexual Activity  . Alcohol use: Yes    Comment: 1 glass wine daily  . Drug use: No  . Sexual activity: Never  Other Topics Concern  . Not on file  Social History Narrative  . Not on file   Social Determinants of Health   Financial Resource Strain:   . Difficulty of Paying Living Expenses: Not on file  Food Insecurity:   . Worried About Charity fundraiser in the Last Year: Not on file  . Ran Out of Food in the Last Year: Not on file  Transportation  Needs:   . Lack of Transportation (Medical): Not on file  . Lack of Transportation (Non-Medical): Not on file  Physical Activity:   . Days of Exercise per Week: Not on file  . Minutes of Exercise per Session: Not on file  Stress:   . Feeling of Stress : Not on file  Social Connections:   . Frequency of Communication with Friends and Family: Not on file  . Frequency of Social Gatherings with Friends and Family: Not on file  . Attends Religious Services: Not on file  . Active Member of Clubs or Organizations: Not on file  . Attends Archivist Meetings: Not on file  . Marital Status: Not on file  Intimate Partner Violence:   . Fear of Current or Ex-Partner: Not on file  . Emotionally Abused: Not on file  . Physically Abused: Not on file  . Sexually  Abused: Not on file     Review of Systems: General: negative for chills, fever, night sweats or weight changes.  Cardiovascular: negative for chest pain, dyspnea on exertion, edema, orthopnea, palpitations, paroxysmal nocturnal dyspnea or shortness of breath Dermatological: negative for rash Respiratory: negative for cough or wheezing Urologic: negative for hematuria Abdominal: negative for nausea, vomiting, diarrhea, bright red blood per rectum, melena, or hematemesis Neurologic: negative for visual changes, syncope, or dizziness All other systems reviewed and are otherwise negative except as noted above.    Blood pressure 139/76, pulse 65, temperature (!) 95 F (35 C), height 6\' 1"  (1.854 m), weight 216 lb (98 kg), SpO2 98 %.  General appearance: alert and no distress Neck: no adenopathy, no carotid bruit, no JVD, supple, symmetrical, trachea midline and thyroid not enlarged, symmetric, no tenderness/mass/nodules Lungs: clear to auscultation bilaterally Heart: regular rate and rhythm, S1, S2 normal, no murmur, click, rub or gallop Extremities: extremities normal, atraumatic, no cyanosis or edema Pulses: 2+ and symmetric Skin: Skin color, texture, turgor normal. No rashes or lesions Neurologic: Alert and oriented X 3, normal strength and tone. Normal symmetric reflexes. Normal coordination and gait  EKG sinus rhythm 64 without ST or T wave changes.  I personally reviewed this EKG.  ASSESSMENT AND PLAN:   HYPERLIPIDEMIA History of hyperlipidemia on simvastatin followed by his PCP  Essential hypertension History of essential hypertension blood pressure measured today at 139/76.  He is on losartan and metoprolol.  Coronary atherosclerosis History of CAD status post LAD and RCA stenting in the past.  His last catheterization performed by Dr. Melvern Banker in 2007 revealed patent stents with normal LV function.  His last Myoview performed 01/23/2017 was nonischemic.  He denies chest pain or  shortness of breath.      Lorretta Harp MD FACP,FACC,FAHA, [facility] 01/05/2019 8:23 AM

## 2019-01-05 NOTE — Patient Instructions (Signed)
Medication Instructions:  Your physician recommends that you continue on your current medications as directed. Please refer to the Current Medication list given to you today.  If you need a refill on your cardiac medications before your next appointment, please call your pharmacy.   Lab work: NONE  Testing/Procedures: NONE  Follow-Up: At [facility], you and your health needs are our priority.  As part of our continuing mission to provide you with exceptional heart care, we have created designated Provider Care Teams.  These Care Teams include your primary Cardiologist (physician) and Advanced Practice Providers (APPs -  Physician Assistants and Nurse Practitioners) who all work together to provide you with the care you need, when you need it. You may see Jonathan Berry, MD or one of the following Advanced Practice Providers on your designated Care Team:    Luke Kilroy, PA-C  Callie Goodrich, PA-C  Jesse Cleaver, FNP Your physician wants you to follow-up in 1 year        

## 2019-01-05 NOTE — Assessment & Plan Note (Signed)
History of hyperlipidemia on simvastatin followed by his PCP 

## 2019-01-05 NOTE — Assessment & Plan Note (Signed)
History of CAD status post LAD and RCA stenting in the past.  His last catheterization performed by Dr. Melvern Banker in 2007 revealed patent stents with normal LV function.  His last Myoview performed 01/23/2017 was nonischemic.  He denies chest pain or shortness of breath.

## 2019-01-09 ENCOUNTER — Other Ambulatory Visit: Payer: Self-pay | Admitting: Internal Medicine

## 2019-01-09 DIAGNOSIS — D473 Essential (hemorrhagic) thrombocythemia: Secondary | ICD-10-CM

## 2019-01-09 DIAGNOSIS — D75839 Thrombocytosis, unspecified: Secondary | ICD-10-CM

## 2019-01-11 DIAGNOSIS — C4441 Basal cell carcinoma of skin of scalp and neck: Secondary | ICD-10-CM | POA: Diagnosis not present

## 2019-01-11 DIAGNOSIS — Z85828 Personal history of other malignant neoplasm of skin: Secondary | ICD-10-CM | POA: Diagnosis not present

## 2019-01-11 DIAGNOSIS — L570 Actinic keratosis: Secondary | ICD-10-CM | POA: Diagnosis not present

## 2019-01-18 DIAGNOSIS — M5442 Lumbago with sciatica, left side: Secondary | ICD-10-CM | POA: Diagnosis not present

## 2019-01-18 DIAGNOSIS — R251 Tremor, unspecified: Secondary | ICD-10-CM | POA: Diagnosis not present

## 2019-03-15 ENCOUNTER — Other Ambulatory Visit: Payer: Self-pay | Admitting: Cardiovascular Disease

## 2019-03-17 ENCOUNTER — Other Ambulatory Visit: Payer: Self-pay | Admitting: Cardiovascular Disease

## 2019-03-17 NOTE — Telephone Encounter (Signed)
 *  STAT* If patient is at the pharmacy, call can be transferred to refill team.   1. Which medications need to be refilled? (please list name of each medication and dose if known)   metoprolol tartrate (LOPRESSOR) 50 MG tablet  2. Which pharmacy/location (including street and city if local pharmacy) is medication to be sent to?   [place], [place]  3. Do they need a 30 day or 90 day supply? 90 day  Patient only has two pills left.

## 2019-03-19 MED ORDER — METOPROLOL TARTRATE 50 MG PO TABS: 50.0000 mg | ORAL_TABLET | Freq: Two times a day (BID) | ORAL | 3 refills | Status: DC

## 2019-03-19 NOTE — Telephone Encounter (Signed)
Called and informed patient that his Lopressor BID was sent to his local [place].  Patient verbalized understanding and all questions (if any) were answered.

## 2019-03-24 DIAGNOSIS — R2689 Other abnormalities of gait and mobility: Secondary | ICD-10-CM | POA: Diagnosis not present

## 2019-03-24 DIAGNOSIS — R2681 Unsteadiness on feet: Secondary | ICD-10-CM | POA: Diagnosis not present

## 2019-03-24 DIAGNOSIS — Z9181 History of falling: Secondary | ICD-10-CM | POA: Diagnosis not present

## 2019-04-08 DIAGNOSIS — Z9181 History of falling: Secondary | ICD-10-CM | POA: Diagnosis not present

## 2019-04-08 DIAGNOSIS — R2689 Other abnormalities of gait and mobility: Secondary | ICD-10-CM | POA: Diagnosis not present

## 2019-04-08 DIAGNOSIS — R2681 Unsteadiness on feet: Secondary | ICD-10-CM | POA: Diagnosis not present

## 2019-04-08 DIAGNOSIS — G9009 Other idiopathic peripheral autonomic neuropathy: Secondary | ICD-10-CM | POA: Diagnosis not present

## 2019-04-10 ENCOUNTER — Other Ambulatory Visit: Payer: Self-pay | Admitting: Internal Medicine

## 2019-04-10 DIAGNOSIS — D75839 Thrombocytosis, unspecified: Secondary | ICD-10-CM

## 2019-04-10 DIAGNOSIS — D473 Essential (hemorrhagic) thrombocythemia: Secondary | ICD-10-CM

## 2019-04-20 DIAGNOSIS — I10 Essential (primary) hypertension: Secondary | ICD-10-CM | POA: Diagnosis not present

## 2019-04-20 DIAGNOSIS — E119 Type 2 diabetes mellitus without complications: Secondary | ICD-10-CM | POA: Diagnosis not present

## 2019-04-27 DIAGNOSIS — E785 Hyperlipidemia, unspecified: Secondary | ICD-10-CM | POA: Diagnosis not present

## 2019-04-27 DIAGNOSIS — E119 Type 2 diabetes mellitus without complications: Secondary | ICD-10-CM | POA: Diagnosis not present

## 2019-04-27 DIAGNOSIS — E039 Hypothyroidism, unspecified: Secondary | ICD-10-CM | POA: Diagnosis not present

## 2019-04-27 DIAGNOSIS — R1310 Dysphagia, unspecified: Secondary | ICD-10-CM | POA: Diagnosis not present

## 2019-04-27 DIAGNOSIS — G629 Polyneuropathy, unspecified: Secondary | ICD-10-CM | POA: Diagnosis not present

## 2019-04-27 DIAGNOSIS — I10 Essential (primary) hypertension: Secondary | ICD-10-CM | POA: Diagnosis not present

## 2019-04-27 DIAGNOSIS — D509 Iron deficiency anemia, unspecified: Secondary | ICD-10-CM | POA: Diagnosis not present

## 2019-04-27 DIAGNOSIS — K219 Gastro-esophageal reflux disease without esophagitis: Secondary | ICD-10-CM | POA: Diagnosis not present

## 2019-05-03 DIAGNOSIS — K219 Gastro-esophageal reflux disease without esophagitis: Secondary | ICD-10-CM | POA: Diagnosis not present

## 2019-05-03 DIAGNOSIS — R1110 Vomiting, unspecified: Secondary | ICD-10-CM | POA: Diagnosis not present

## 2019-05-03 DIAGNOSIS — R1310 Dysphagia, unspecified: Secondary | ICD-10-CM | POA: Diagnosis not present

## 2019-05-07 ENCOUNTER — Telehealth: Payer: Self-pay | Admitting: *Deleted

## 2019-05-07 NOTE — Telephone Encounter (Signed)
Agree with clearance for GI procedure. OK to hold ASA and reduce dose as well.  THX

## 2019-05-07 NOTE — Telephone Encounter (Signed)
   Primary Cardiologist: Quay Burow, MD  Chart reviewed as part of pre-operative protocol coverage. Patient was contacted 05/07/2019 in reference to pre-operative risk assessment for pending surgery as outlined below.  [name] was last seen on 01/05/2019 by Dr. Gwenlyn Found.  Since that day, [name] has done fine from a cardiac standpoint. Fatigue and joint paints seem to be his most limiting factors. He denies any chest pain or SOB. He is limited in activity and unable to complete 4 METs, though denies anginal complaints when working with PT which seems to be his most strenuous activity these days.   Given low risk procedure, favor clearing for planned procedure without further cardiovascular testing, though will get Dr. Kennon Holter input as well. Additionally the patient is on aspirin 325mg  daily. Seems 81mg  daily dosing would be appropriate for him. Will get Dr. Kennon Holter input on making this change, as well as recommendations for holding aspirin given cardiac history.   I will route this recommendation to the requesting party via Epic fax function once I hear back from Dr. Gwenlyn Found.  Abigail Butts, PA-C 05/07/2019, 1:53 PM

## 2019-05-07 NOTE — Telephone Encounter (Signed)
   Primary Cardiologist: Quay Burow, MD  Chart reviewed as part of pre-operative protocol coverage. Per Dr. Gwenlyn Found and given past medical history and time since last visit, Guilherme A Shipes would be at acceptable risk for the planned procedure without further cardiovascular testing.   Additionally patient can hold aspirin 5-7 days prior to his upcoming procedure with plans to restart at reduced dose, 81mg  daily, following his colonoscopy.  I will route this recommendation to the requesting party via Epic fax function and remove from pre-op pool.  Please call with questions.  Callback: - Please contact the patient and convey recommendations to reduce aspirin to 81mg  daily following his colonoscopy.  Abigail Butts, PA-C 05/07/2019, 4:11 PM

## 2019-05-07 NOTE — Telephone Encounter (Signed)
Pt has been advised ok for procedure. He will need to hold ASA 5-7 days prior to procedure. Pt states he has begun holding ASA as of today. I did go over recommendations per Dr. Gwenlyn Found that after he has his procedure he will need to drop his ASA down to 81 mg daily. Pt is agreeable to plan of care and with verbal understanding. Pt thanked me for the call.

## 2019-05-07 NOTE — Telephone Encounter (Signed)
   [facility] Pre-operative Risk Assessment    Request for surgical clearance:  1. What type of surgery is being performed? Colonoscopy   2. When is this surgery scheduled? 05/11/19   3. What type of clearance is required (medical clearance vs. Pharmacy clearance to hold med vs. Both)? Both  4. Are there any medications that need to be held prior to surgery and how long? Aspirin   5. Practice name and name of physician performing surgery? [facility], Dr. Carol Ada   6. What is your office phone number [phone]    7.   What is your office fax number [phone]  8.   Anesthesia type (None, local, MAC, general) ? Propofol   Ricci Barker 05/07/2019, 12:23 PM  _________________________________________________________________   (provider comments below)

## 2019-05-10 DIAGNOSIS — G9009 Other idiopathic peripheral autonomic neuropathy: Secondary | ICD-10-CM | POA: Diagnosis not present

## 2019-05-10 DIAGNOSIS — R2681 Unsteadiness on feet: Secondary | ICD-10-CM | POA: Diagnosis not present

## 2019-05-10 DIAGNOSIS — R2689 Other abnormalities of gait and mobility: Secondary | ICD-10-CM | POA: Diagnosis not present

## 2019-05-10 DIAGNOSIS — Z9181 History of falling: Secondary | ICD-10-CM | POA: Diagnosis not present

## 2019-05-11 DIAGNOSIS — K222 Esophageal obstruction: Secondary | ICD-10-CM | POA: Diagnosis not present

## 2019-05-11 DIAGNOSIS — K449 Diaphragmatic hernia without obstruction or gangrene: Secondary | ICD-10-CM | POA: Diagnosis not present

## 2019-05-11 DIAGNOSIS — R1310 Dysphagia, unspecified: Secondary | ICD-10-CM | POA: Diagnosis not present

## 2019-06-03 ENCOUNTER — Telehealth: Payer: Self-pay | Admitting: Cardiovascular Disease

## 2019-06-03 NOTE — Telephone Encounter (Signed)
Follow Up  Patient returning call.Transferred to triage

## 2019-06-03 NOTE — Telephone Encounter (Signed)
APPT MADE WITH DR Gwenlyn Found FOR 06/11/19 AT 2:00 PM .Adonis Housekeeper

## 2019-06-03 NOTE — Telephone Encounter (Signed)
Pt c/o swelling: STAT is pt has developed SOB within 24 hours  1) How much weight have you gained and in what time span? No weight gain  2) If swelling, where is the swelling located? Feet and ankles  3) Are you currently taking a fluid pill? Yes  4) Are you currently SOB? No  5) Do you have a log of your daily weights (if so, list)? No  6) Have you gained 3 pounds in a day or 5 pounds in a week? No  7) Have you traveled recently? No

## 2019-06-03 NOTE — Telephone Encounter (Signed)
Have patient see an APP back in the office the next week or 2 to evaluate swelling.

## 2019-06-03 NOTE — Telephone Encounter (Signed)
Tried to call patient twice, no answer.

## 2019-06-03 NOTE — Telephone Encounter (Signed)
Spoke with pt and for 3 -4 weeks has noted swelling in feet and ankles Per pt swelling is not as bad upon getting out of bed in the am but as day goes on swelling increases Pt was give Furosemide 40 mg daily from PCP on 05/18/19  and had taken for 3 days and did not see any difference and discontinued med Encouraged pt to watch salt intake and will forward message to Dr Gwenlyn Found for review and recommendations./cy

## 2019-06-09 DIAGNOSIS — R1310 Dysphagia, unspecified: Secondary | ICD-10-CM | POA: Diagnosis not present

## 2019-06-09 DIAGNOSIS — K449 Diaphragmatic hernia without obstruction or gangrene: Secondary | ICD-10-CM | POA: Diagnosis not present

## 2019-06-09 DIAGNOSIS — K222 Esophageal obstruction: Secondary | ICD-10-CM | POA: Diagnosis not present

## 2019-06-10 DIAGNOSIS — E119 Type 2 diabetes mellitus without complications: Secondary | ICD-10-CM | POA: Diagnosis not present

## 2019-06-10 DIAGNOSIS — H524 Presbyopia: Secondary | ICD-10-CM | POA: Diagnosis not present

## 2019-06-10 DIAGNOSIS — Z961 Presence of intraocular lens: Secondary | ICD-10-CM | POA: Diagnosis not present

## 2019-06-11 ENCOUNTER — Other Ambulatory Visit: Payer: Self-pay

## 2019-06-11 ENCOUNTER — Encounter: Payer: Self-pay | Admitting: Cardiovascular Disease

## 2019-06-11 ENCOUNTER — Encounter (INDEPENDENT_AMBULATORY_CARE_PROVIDER_SITE_OTHER): Payer: Self-pay

## 2019-06-11 ENCOUNTER — Ambulatory Visit: Payer: Medicare HMO | Admitting: Cardiovascular Disease

## 2019-06-11 VITALS — BP 116/68 | HR 75 | Ht 72.0 in | Wt 209.8 lb

## 2019-06-11 DIAGNOSIS — Z79899 Other long term (current) drug therapy: Secondary | ICD-10-CM | POA: Diagnosis not present

## 2019-06-11 DIAGNOSIS — R600 Localized edema: Secondary | ICD-10-CM

## 2019-06-11 DIAGNOSIS — E785 Hyperlipidemia, unspecified: Secondary | ICD-10-CM

## 2019-06-11 DIAGNOSIS — I2510 Atherosclerotic heart disease of native coronary artery without angina pectoris: Secondary | ICD-10-CM

## 2019-06-11 MED ORDER — FUROSEMIDE 40 MG PO TABS
40.0000 mg | ORAL_TABLET | Freq: Every day | ORAL | 3 refills | Status: DC
Start: 2019-06-11 — End: 2022-12-16

## 2019-06-11 NOTE — Patient Instructions (Addendum)
Medication Instructions:  RESUME lasix 40mg  daily  *If you need a refill on your cardiac medications before your next appointment, please call your pharmacy*   Lab Work: FASTING - no food/drink after midnight (water is OK) BMET, Lipid Panel, Liver Function Panel in 3 weeks  You can come to Dr. Kennon Holter office for the lab test. No appointment is needed. Our lab is open M-F from 8am-4:30pm and closed from about 12:45-1:45pm for lunch   If you have labs (blood work) drawn today and your tests are completely normal, you will receive your results only by: Marland Kitchen MyChart Message (if you have MyChart) OR . A paper copy in the mail If you have any lab test that is abnormal or we need to change your treatment, we will call you to review the results.   Testing/Procedures: Echocardiogram @ [address] 3rd Floor   Follow-Up: At [facility], you and your health needs are our priority.  As part of our continuing mission to provide you with exceptional heart care, we have created designated Provider Care Teams.  These Care Teams include your primary Cardiologist (physician) and Advanced Practice Providers (APPs -  Physician Assistants and Nurse Practitioners) who all work together to provide you with the care you need, when you need it.  We recommend signing up for the patient portal called "MyChart".  Sign up information is provided on this After Visit Summary.  MyChart is used to connect with patients for Virtual Visits (Telemedicine).  Patients are able to view lab/test results, encounter notes, upcoming appointments, etc.  Non-urgent messages can be sent to your provider as well.   To learn more about what you can do with MyChart, go to [email or web address].    Your next appointment:   4 week(s)  The format for your next appointment:   In Person  Provider:   You may see Quay Burow, MD or one of the following Advanced Practice Providers on your designated Care Team:    Kerin Ransom, PA-C  [place], [place]  Coletta Memos, [place]    Other Instructions

## 2019-06-11 NOTE — Assessment & Plan Note (Signed)
Mr. Parenteau returns today with complaints of new onset bilateral lower extreme edema over the last several weeks.  He really denies shortness of breath.  It is unclear why this has developed.  He did start Lasix 40 mg a day for approximately a week without benefit.  I am going to restart his Lasix for a month and check a basic metabolic panel in 2 to 3 weeks.  We will check a 2D echo as well.  See him back in 4 weeks

## 2019-06-11 NOTE — Assessment & Plan Note (Signed)
History of CAD status post LAD and RCA stenting in the past.  His last catheterization per was performed by Dr. Melvern Banker in 2007 revealing patent stents with normal LV function.  He had a Myoview stress test performed October 2014 which was normal.  He denies chest pain.

## 2019-06-11 NOTE — Progress Notes (Signed)
06/11/2019 [name]   03/01/1932  [id number]  Primary Physician [name] Gravel, MD Primary Cardiologist: [name] Harp MD FACP, [place], [place], [place]  HPI:  [name] is a 86 y.o.   moderately overweight widowed Caucasian male (wife died Sep 27, 2010 at age 30), father of 70, grandfather to 67 grandchildren who I last sawin the office 02/04/2018.He has a history of CAD status post LAD and RCA stenting in the past. He was last catheterized by Dr. Melvern [name] in 2007 and found to have patent stents with normal LV function. His other problems include hypertension, hyperlipidemia and diabetes. He does not smoke or drink.Since I saw him 11/26/13 he has been relatively asymptomatic. He had a Myoview stress test and 2-D echo performed in October 2014 year which were normal. He has also gotten engaged since I last saw him and remains engaged to this day. He complains of erectile dysfunction.  Since I saw him 6 months ago he is done well until recently when he noticed new onset bilateral ankle edema.  He saw his Clarene Duke PA-C who began him on furosemide 40 mg a day for a week.  This did not improve his edema and he stopped it.  He really denies increasing shortness of breath.  He eats minimal salt.   Current Meds  Medication Sig  . acetaminophen (TYLENOL) 325 MG tablet Take 650 mg by mouth every 6 (six) hours as needed for moderate pain. Reported on 01/23/2015  . ASPIRIN 81 PO Take by mouth.  Marland Kitchen CINNAMON PO Take 1 capsule by mouth 2 (two) times daily.  . clobetasol cream (TEMOVATE) 0.05 %   . gabapentin (NEURONTIN) 100 MG capsule Take 100 mg by mouth 2 (two) times daily.  Marland Kitchen glimepiride (AMARYL) 4 MG tablet Take 4 mg by mouth daily before breakfast.  . hydrocortisone 2.5 % cream APPLY TOPICALLY TO AFFECTED AREA TO RASH  QD PRN  . hydroxyurea (HYDREA) 500 MG capsule TAKE 1 CAPSULE BY MOUTH ONCE DAILY. MAY  TAKE  WITH  FOOD  TO  MINIMIZE  GI  EFFECTS  . losartan (COZAAR) 100 MG tablet Take  100 mg by mouth daily.  . metoprolol tartrate (LOPRESSOR) 50 MG tablet Take 1 tablet (50 mg total) by mouth 2 (two) times daily.  . Multiple Vitamin (MULTIVITAMIN WITH MINERALS) TABS tablet Take 1 tablet by mouth every morning.  . Omega-3 Fatty Acids (FISH OIL) 1200 MG CAPS Take 1 capsule by mouth 2 (two) times daily.  Marland Kitchen omeprazole (PRILOSEC) 20 MG capsule Take 40 mg by mouth daily.   . simvastatin (ZOCOR) 40 MG tablet Take 1 tablet (40 mg total) by mouth every evening.  . [DISCONTINUED] aspirin 325 MG tablet Take 325 mg by mouth every morning.      No Known Allergies  Social History   Socioeconomic History  . Marital status: Married    Spouse name: Not on file  . Number of children: 4  . Years of education: 25  . Highest education level: Not on file  Occupational History  . Not on file  Tobacco Use  . Smoking status: Former Smoker    Types: Cigarettes  . Smokeless tobacco: Former Systems developer    Quit date: 10/31/1982  Substance and Sexual Activity  . Alcohol use: Yes    Comment: 1 glass wine daily  . Drug use: No  . Sexual activity: Never  Other Topics Concern  . Not on file  Social History Narrative  .  Not on file   Social Determinants of Health   Financial Resource Strain:   . Difficulty of Paying Living Expenses:   Food Insecurity:   . Worried About Charity fundraiser in the Last Year:   . Arboriculturist in the Last Year:   Transportation Needs:   . Film/video editor (Medical):   Marland Kitchen Lack of Transportation (Non-Medical):   Physical Activity:   . Days of Exercise per Week:   . Minutes of Exercise per Session:   Stress:   . Feeling of Stress :   Social Connections:   . Frequency of Communication with Friends and Family:   . Frequency of Social Gatherings with Friends and Family:   . Attends Religious Services:   . Active Member of Clubs or Organizations:   . Attends Archivist Meetings:   Marland Kitchen Marital Status:   Intimate Partner Violence:   . Fear of  Current or Ex-Partner:   . Emotionally Abused:   Marland Kitchen Physically Abused:   . Sexually Abused:      Review of Systems: General: negative for chills, fever, night sweats or weight changes.  Cardiovascular: negative for chest pain, dyspnea on exertion, edema, orthopnea, palpitations, paroxysmal nocturnal dyspnea or shortness of breath Dermatological: negative for rash Respiratory: negative for cough or wheezing Urologic: negative for hematuria Abdominal: negative for nausea, vomiting, diarrhea, bright red blood per rectum, melena, or hematemesis Neurologic: negative for visual changes, syncope, or dizziness All other systems reviewed and are otherwise negative except as noted above.    Blood pressure 116/68, pulse 75, height 6' (1.829 m), weight 209 lb 12.8 oz (95.2 kg), SpO2 97 %.  General appearance: alert and no distress Neck: no adenopathy, no carotid bruit, no JVD, supple, symmetrical, trachea midline and thyroid not enlarged, symmetric, no tenderness/mass/nodules Lungs: clear to auscultation bilaterally Heart: regular rate and rhythm, S1, S2 normal, no murmur, click, rub or gallop Extremities: 1-2+ bilateral ankle edema Pulses: 2+ and symmetric Skin: Skin color, texture, turgor normal. No rashes or lesions Neurologic: Alert and oriented X 3, normal strength and tone. Normal symmetric reflexes. Normal coordination and gait  EKG sinus rhythm at 75 with inferior Q waves.  I personally reviewed this EKG.  ASSESSMENT AND PLAN:   Bilateral lower extremity edema [name] returns today with complaints of new onset bilateral lower extreme edema over the last several weeks.  He really denies shortness of breath.  It is unclear why this has developed.  He did start Lasix 40 mg a day for approximately a week without benefit.  I am going to restart his Lasix for a month and check a basic metabolic panel in 2 to 3 weeks.  We will check a 2D echo as well.  See him back in 4  weeks  HYPERLIPIDEMIA History of hyperlipidemia on simvastatin followed by his PCP.  Essential hypertension History of essential hypertension blood pressure measured today 116/68.  He is on Lopressor and losartan.  Coronary atherosclerosis History of CAD status post LAD and RCA stenting in the past.  His last catheterization per was performed by Dr. Melvern [name] in 2007 revealing patent stents with normal LV function.  He had a Myoview stress test performed October 2014 which was normal.  He denies chest pain.      [name] Harp MD FACP,FACC,FAHA, [facility] 06/11/2019 2:24 PM

## 2019-06-11 NOTE — Assessment & Plan Note (Signed)
History of essential hypertension blood pressure measured today 116/68.  He is on Lopressor and losartan.

## 2019-06-11 NOTE — Assessment & Plan Note (Signed)
History of hyperlipidemia on simvastatin followed by his PCP 

## 2019-06-13 ENCOUNTER — Other Ambulatory Visit: Payer: Self-pay | Admitting: Cardiovascular Disease

## 2019-06-17 ENCOUNTER — Encounter: Payer: Self-pay | Admitting: Internal Medicine

## 2019-06-17 ENCOUNTER — Telehealth: Payer: Self-pay | Admitting: Internal Medicine

## 2019-06-17 ENCOUNTER — Inpatient Hospital Stay: Payer: Medicare HMO | Attending: Internal Medicine | Admitting: Internal Medicine

## 2019-06-17 ENCOUNTER — Other Ambulatory Visit: Payer: Self-pay

## 2019-06-17 ENCOUNTER — Inpatient Hospital Stay: Payer: Medicare HMO

## 2019-06-17 VITALS — BP 121/52 | HR 65 | Temp 97.5°F | Resp 18 | Ht 72.0 in | Wt 206.5 lb

## 2019-06-17 DIAGNOSIS — E785 Hyperlipidemia, unspecified: Secondary | ICD-10-CM | POA: Diagnosis not present

## 2019-06-17 DIAGNOSIS — I10 Essential (primary) hypertension: Secondary | ICD-10-CM | POA: Insufficient documentation

## 2019-06-17 DIAGNOSIS — Z7982 Long term (current) use of aspirin: Secondary | ICD-10-CM | POA: Diagnosis not present

## 2019-06-17 DIAGNOSIS — E119 Type 2 diabetes mellitus without complications: Secondary | ICD-10-CM | POA: Insufficient documentation

## 2019-06-17 DIAGNOSIS — I2510 Atherosclerotic heart disease of native coronary artery without angina pectoris: Secondary | ICD-10-CM | POA: Insufficient documentation

## 2019-06-17 DIAGNOSIS — D45 Polycythemia vera: Secondary | ICD-10-CM | POA: Diagnosis present

## 2019-06-17 DIAGNOSIS — D75839 Thrombocytosis, unspecified: Secondary | ICD-10-CM

## 2019-06-17 DIAGNOSIS — E871 Hypo-osmolality and hyponatremia: Secondary | ICD-10-CM | POA: Diagnosis not present

## 2019-06-17 DIAGNOSIS — D473 Essential (hemorrhagic) thrombocythemia: Secondary | ICD-10-CM

## 2019-06-17 DIAGNOSIS — Z79899 Other long term (current) drug therapy: Secondary | ICD-10-CM | POA: Diagnosis not present

## 2019-06-17 LAB — CMP (CANCER CENTER ONLY)
ALT: 15 U/L (ref 0–44)
AST: 16 U/L (ref 15–41)
Albumin: 3.7 g/dL (ref 3.5–5.0)
Alkaline Phosphatase: 56 U/L (ref 38–126)
Anion gap: 9 (ref 5–15)
BUN: 14 mg/dL (ref 8–23)
CO2: 25 mmol/L (ref 22–32)
Calcium: 9.7 mg/dL (ref 8.9–10.3)
Chloride: 99 mmol/L (ref 98–111)
Creatinine: 1.5 mg/dL — ABNORMAL HIGH (ref 0.61–1.24)
GFR, Est AFR Am: 49 mL/min — ABNORMAL LOW (ref >60)
GFR, Estimated: 42 mL/min — ABNORMAL LOW (ref >60)
Glucose, Bld: 144 mg/dL — ABNORMAL HIGH (ref 70–99)
Potassium: 4.5 mmol/L (ref 3.5–5.1)
Sodium: 133 mmol/L — ABNORMAL LOW (ref 135–145)
Total Bilirubin: 0.7 mg/dL (ref 0.3–1.2)
Total Protein: 7.7 g/dL (ref 6.5–8.1)

## 2019-06-17 LAB — CBC WITH DIFFERENTIAL (CANCER CENTER ONLY)
Abs Immature Granulocytes: 0.05 10*3/uL (ref 0.00–0.07)
Basophils Absolute: 0.2 10*3/uL — ABNORMAL HIGH (ref 0.0–0.1)
Basophils Relative: 2 %
Eosinophils Absolute: 0.1 10*3/uL (ref 0.0–0.5)
Eosinophils Relative: 1 %
HCT: 45.1 % (ref 39.0–52.0)
Hemoglobin: 13.9 g/dL (ref 13.0–17.0)
Immature Granulocytes: 1 %
Lymphocytes Relative: 14 %
Lymphs Abs: 1.4 10*3/uL (ref 0.7–4.0)
MCH: 27.1 pg (ref 26.0–34.0)
MCHC: 30.8 g/dL (ref 30.0–36.0)
MCV: 88.1 fL (ref 80.0–100.0)
Monocytes Absolute: 0.9 10*3/uL (ref 0.1–1.0)
Monocytes Relative: 9 %
Neutro Abs: 7.4 10*3/uL (ref 1.7–7.7)
Neutrophils Relative %: 73 %
Platelet Count: 517 10*3/uL — ABNORMAL HIGH (ref 150–400)
RBC: 5.12 MIL/uL (ref 4.22–5.81)
RDW: 14.3 % (ref 11.5–15.5)
WBC Count: 10 10*3/uL (ref 4.0–10.5)
nRBC: 0 % (ref 0.0–0.2)

## 2019-06-17 LAB — LACTATE DEHYDROGENASE: LDH: 147 U/L (ref 98–192)

## 2019-06-17 NOTE — Telephone Encounter (Signed)
Scheduled appt per 6/10 los. ° °Printed calendar and avs. °

## 2019-06-17 NOTE — Progress Notes (Signed)
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  PRIOR THERAPY: Phlebotomy on as-needed basis.   CURRENT THERAPY: Hydroxyurea 500 mg by mouth daily.   INTERVAL HISTORY: [name] 86 y.o. male returns to the clinic today for 6 months follow-up visit.  The patient is feeling fine today with no concerning complaints.  He denied having any current chest pain, shortness of breath, cough or hemoptysis.  He denied having any fever or chills.  He has no nausea, vomiting, diarrhea or constipation.  He has no headache or visual changes.  He denied having any weight loss or night sweats.  He has no bleeding issues.  He continues to tolerate his treatment with Hydrea fairly well.  He is here today for evaluation with repeat blood work.  MEDICAL HISTORY: Past Medical History:  Diagnosis Date  . Chest pain   . Coronary artery disease   . Diabetes mellitus without complication ([place])   . Hyperlipidemia   . Hypertension     ALLERGIES:  has No Known Allergies.  MEDICATIONS:  Current Outpatient Medications  Medication Sig Dispense Refill  . acetaminophen (TYLENOL) 325 MG tablet Take 650 mg by mouth every 6 (six) hours as needed for moderate pain. Reported on 01/23/2015    . ASPIRIN 81 PO Take by mouth.    Marland Kitchen CINNAMON PO Take 1 capsule by mouth 2 (two) times daily.    . clobetasol cream (TEMOVATE) 0.05 %     . furosemide (LASIX) 40 MG tablet Take 1 tablet (40 mg total) by mouth daily. 90 tablet 3  . gabapentin (NEURONTIN) 100 MG capsule Take 100 mg by mouth 2 (two) times daily.    Marland Kitchen glimepiride (AMARYL) 4 MG tablet Take 4 mg by mouth daily before breakfast.    . hydrocortisone 2.5 % cream APPLY TOPICALLY TO AFFECTED AREA TO RASH  QD PRN  1  . hydroxyurea (HYDREA) 500 MG  capsule TAKE 1 CAPSULE BY MOUTH ONCE DAILY. MAY  TAKE  WITH  FOOD  TO  MINIMIZE  GI  EFFECTS 90 capsule 0  . losartan (COZAAR) 100 MG tablet Take 100 mg by mouth daily.    . metoprolol tartrate (LOPRESSOR) 50 MG tablet Take 1 tablet (50 mg total) by mouth 2 (two) times daily. 180 tablet 3  . Multiple Vitamin (MULTIVITAMIN WITH MINERALS) TABS tablet Take 1 tablet by mouth every morning.    . Omega-3 Fatty Acids (FISH OIL) 1200 MG CAPS Take 1 capsule by mouth 2 (two) times daily.    Marland Kitchen omeprazole (PRILOSEC) 20 MG capsule Take 40 mg by mouth daily.     . simvastatin (ZOCOR) 40 MG tablet Take 1 tablet (40 mg total) by mouth every evening. 90 tablet 3   No current facility-administered medications for this visit.    SURGICAL HISTORY:  Past Surgical History:  Procedure Laterality Date  . CARDIAC CATHETERIZATION  11/01/2005   patent stents with normal LV function  . CARDIAC CATHETERIZATION  11/08/2003   cypher stent mid dominant right coronary lesion  . CARDIAC SURGERY    . CORONARY ANGIOPLASTY     post LAD and RCA stenting  . DOPPLER ECHOCARDIOGRAPHY  06/18/2010   EF =>55%,LV normal  . EYE SURGERY    . holter monitor  11/08/2005  . NM [facility]  PERF WALL MOTION  05/23/2008   EF 62% ,norm myocardial perfusion    REVIEW OF SYSTEMS:  A comprehensive review of systems was negative except for: Constitutional: positive for fatigue Musculoskeletal: positive for arthralgias   PHYSICAL EXAMINATION: General appearance: alert, cooperative and no distress Head: Normocephalic, without obvious abnormality, atraumatic Neck: no adenopathy, no JVD, supple, symmetrical, trachea midline and thyroid not enlarged, symmetric, no tenderness/mass/nodules Lymph nodes: Cervical, supraclavicular, and axillary nodes normal. Resp: clear to auscultation bilaterally Back: symmetric, no curvature. ROM normal. No CVA tenderness. Cardio: regular rate and rhythm, S1, S2 normal, no murmur, click, rub or gallop GI: soft,  non-tender; bowel sounds normal; no masses,  no organomegaly Extremities: extremities normal, atraumatic, no cyanosis or edema  ECOG PERFORMANCE STATUS: 1 - Symptomatic but completely ambulatory  Blood pressure (!) 121/52, pulse 65, temperature (!) 97.5 F (36.4 C), temperature source Temporal, resp. rate 18, height 6' (1.829 m), weight 206 lb 8 oz (93.7 kg), SpO2 97 %.  LABORATORY DATA: Lab Results  Component Value Date   WBC 10.0 06/17/2019   HGB 13.9 06/17/2019   HCT 45.1 06/17/2019   MCV 88.1 06/17/2019   PLT 517 (H) 06/17/2019      Chemistry      Component Value Date/Time   NA 133 (L) 06/17/2019 0945   NA 137 10/29/2016 1018   K 4.5 06/17/2019 0945   K 4.9 10/29/2016 1018   CL 99 06/17/2019 0945   CO2 25 06/17/2019 0945   CO2 25 10/29/2016 1018   BUN 14 06/17/2019 0945   BUN 13.5 10/29/2016 1018   CREATININE 1.50 (H) 06/17/2019 0945   CREATININE 1.2 10/29/2016 1018      Component Value Date/Time   CALCIUM 9.7 06/17/2019 0945   CALCIUM 9.1 10/29/2016 1018   ALKPHOS 56 06/17/2019 0945   ALKPHOS 62 10/29/2016 1018   AST 16 06/17/2019 0945   AST 18 10/29/2016 1018   ALT 15 06/17/2019 0945   ALT 12 10/29/2016 1018   BILITOT 0.7 06/17/2019 0945   BILITOT 0.46 10/29/2016 1018       RADIOGRAPHIC STUDIES: No results found.  ASSESSMENT AND PLAN:  This is a very pleasant 86 years old white male with history of myeloproliferative disorder, polycythemia vera with positive JAK 2 mutation. The patient is currently on treatment with hydroxyurea 500 mg by mouth daily. The patient continues to tolerate his treatment with hydroxyurea fairly well. CBC today showed mild increase in his platelets count but otherwise he is doing fine. He is currently on Lasix and this would explain his current hyponatremia and elevation of his serum creatinine. He will discuss with his primary care physician adjusting his medication. I recommended for the patient to continue on hydroxyurea 500  mg p.o. daily. I will see him back for follow-up visit in 6 months with repeat blood work. He was advised to call immediately if he has any concerning symptoms in the interval. The patient voices understanding of current disease status and treatment options and is in agreement with the current care plan. All questions were answered. The patient knows to call the clinic with any problems, questions or concerns. We can certainly see the patient much sooner if necessary.  Disclaimer: This note was dictated with voice recognition software. Similar sounding words can inadvertently be transcribed and may not be corrected upon review.

## 2019-06-21 ENCOUNTER — Other Ambulatory Visit: Payer: Self-pay | Admitting: Cardiovascular Disease

## 2019-06-22 ENCOUNTER — Other Ambulatory Visit: Payer: Self-pay | Admitting: Cardiovascular Disease

## 2019-06-22 NOTE — Telephone Encounter (Signed)
New Message   *STAT* If patient is at the pharmacy, call can be transferred to refill team.   1. Which medications need to be refilled? (please list name of each medication and dose if known) simvastatin (ZOCOR) 40 MG tablet  2. Which pharmacy/location (including street and city if local pharmacy) is medication to be sent to? [place], [place]  3. Do they need a 30 day or 90 day supply? 90 day

## 2019-07-06 ENCOUNTER — Other Ambulatory Visit: Payer: Self-pay

## 2019-07-06 ENCOUNTER — Ambulatory Visit (HOSPITAL_COMMUNITY): Payer: Medicare HMO | Attending: Cardiovascular Disease

## 2019-07-06 DIAGNOSIS — R600 Localized edema: Secondary | ICD-10-CM | POA: Diagnosis not present

## 2019-07-09 ENCOUNTER — Other Ambulatory Visit: Payer: Self-pay

## 2019-07-09 ENCOUNTER — Ambulatory Visit (INDEPENDENT_AMBULATORY_CARE_PROVIDER_SITE_OTHER): Payer: Medicare HMO | Admitting: Cardiovascular Disease

## 2019-07-09 ENCOUNTER — Encounter: Payer: Self-pay | Admitting: Cardiovascular Disease

## 2019-07-09 VITALS — BP 126/62 | HR 56 | Ht 72.0 in | Wt 208.0 lb

## 2019-07-09 DIAGNOSIS — R600 Localized edema: Secondary | ICD-10-CM | POA: Diagnosis not present

## 2019-07-09 NOTE — Patient Instructions (Signed)
Medication Instructions:  NO CHANGE *If you need a refill on your cardiac medications before your next appointment, please call your pharmacy*   Lab Work: If you have labs (blood work) drawn today and your tests are completely normal, you will receive your results only by: . MyChart Message (if you have MyChart) OR . A paper copy in the mail If you have any lab test that is abnormal or we need to change your treatment, we will call you to review the results.  Follow-Up: At [facility], you and your health needs are our priority.  As part of our continuing mission to provide you with exceptional heart care, we have created designated Provider Care Teams.  These Care Teams include your primary Cardiologist (physician) and Advanced Practice Providers (APPs -  Physician Assistants and Nurse Practitioners) who all work together to provide you with the care you need, when you need it.  We recommend signing up for the patient portal called "MyChart".  Sign up information is provided on this After Visit Summary.  MyChart is used to connect with patients for Virtual Visits (Telemedicine).  Patients are able to view lab/test results, encounter notes, upcoming appointments, etc.  Non-urgent messages can be sent to your provider as well.   To learn more about what you can do with MyChart, go to [email or web address].    Your next appointment:   6 month(s)  The format for your next appointment:   In Person  Provider:   Jonathan Berry, MD   

## 2019-07-09 NOTE — Progress Notes (Signed)
[name] returns today for follow-up of lower extremity edema.  He is on furosemide 40 mg a day.  He limits his salt intake.  His edema has significantly improved.  2D echo revealed normal LV systolic function with grade 2 diastolic dysfunction and no valvular abnormalities.  He denies shortness of breath.  I will see him back in 6 months for follow-up.  Lorretta Harp, M.D., [place], [facility], [place] [address]. [place], [place]  [address]  [phone] 07/09/2019 10:33 AM

## 2019-07-12 ENCOUNTER — Other Ambulatory Visit: Payer: Self-pay | Admitting: Internal Medicine

## 2019-07-12 DIAGNOSIS — D75839 Thrombocytosis, unspecified: Secondary | ICD-10-CM

## 2019-07-14 DIAGNOSIS — Z85828 Personal history of other malignant neoplasm of skin: Secondary | ICD-10-CM | POA: Diagnosis not present

## 2019-07-14 DIAGNOSIS — C4441 Basal cell carcinoma of skin of scalp and neck: Secondary | ICD-10-CM | POA: Diagnosis not present

## 2019-07-14 DIAGNOSIS — D1801 Hemangioma of skin and subcutaneous tissue: Secondary | ICD-10-CM | POA: Diagnosis not present

## 2019-07-14 DIAGNOSIS — D485 Neoplasm of uncertain behavior of skin: Secondary | ICD-10-CM | POA: Diagnosis not present

## 2019-07-14 DIAGNOSIS — L821 Other seborrheic keratosis: Secondary | ICD-10-CM | POA: Diagnosis not present

## 2019-07-21 ENCOUNTER — Other Ambulatory Visit: Payer: Self-pay

## 2019-07-21 DIAGNOSIS — I77810 Thoracic aortic ectasia: Secondary | ICD-10-CM

## 2019-07-21 NOTE — Progress Notes (Signed)
echo

## 2019-07-30 ENCOUNTER — Telehealth: Payer: Self-pay

## 2019-07-30 DIAGNOSIS — I712 Thoracic aortic aneurysm, without rupture, unspecified: Secondary | ICD-10-CM

## 2019-07-30 DIAGNOSIS — I77810 Thoracic aortic ectasia: Secondary | ICD-10-CM

## 2019-07-30 NOTE — Telephone Encounter (Signed)
Spoke to patient advised he will need a chest ct in 06/2020 to follow up dilated ascending thoracic aorta.Advised to have bmet 1 week before chest ct.Lab order mailed.Message to to scheduling pool to schedule.

## 2019-08-02 DIAGNOSIS — C4441 Basal cell carcinoma of skin of scalp and neck: Secondary | ICD-10-CM | POA: Diagnosis not present

## 2019-08-05 NOTE — Telephone Encounter (Signed)
Opened in error

## 2019-08-24 DIAGNOSIS — E785 Hyperlipidemia, unspecified: Secondary | ICD-10-CM | POA: Diagnosis not present

## 2019-08-24 DIAGNOSIS — E119 Type 2 diabetes mellitus without complications: Secondary | ICD-10-CM | POA: Diagnosis not present

## 2019-08-24 DIAGNOSIS — E039 Hypothyroidism, unspecified: Secondary | ICD-10-CM | POA: Diagnosis not present

## 2019-08-31 DIAGNOSIS — E119 Type 2 diabetes mellitus without complications: Secondary | ICD-10-CM | POA: Diagnosis not present

## 2019-08-31 DIAGNOSIS — K219 Gastro-esophageal reflux disease without esophagitis: Secondary | ICD-10-CM | POA: Diagnosis not present

## 2019-08-31 DIAGNOSIS — I10 Essential (primary) hypertension: Secondary | ICD-10-CM | POA: Diagnosis not present

## 2019-08-31 DIAGNOSIS — E785 Hyperlipidemia, unspecified: Secondary | ICD-10-CM | POA: Diagnosis not present

## 2019-08-31 DIAGNOSIS — R1310 Dysphagia, unspecified: Secondary | ICD-10-CM | POA: Diagnosis not present

## 2019-08-31 DIAGNOSIS — Z0000 Encounter for general adult medical examination without abnormal findings: Secondary | ICD-10-CM | POA: Diagnosis not present

## 2019-08-31 DIAGNOSIS — E039 Hypothyroidism, unspecified: Secondary | ICD-10-CM | POA: Diagnosis not present

## 2019-08-31 DIAGNOSIS — E875 Hyperkalemia: Secondary | ICD-10-CM | POA: Diagnosis not present

## 2019-08-31 DIAGNOSIS — G629 Polyneuropathy, unspecified: Secondary | ICD-10-CM | POA: Diagnosis not present

## 2019-10-08 ENCOUNTER — Other Ambulatory Visit: Payer: Self-pay | Admitting: Internal Medicine

## 2019-10-08 DIAGNOSIS — D75839 Thrombocytosis, unspecified: Secondary | ICD-10-CM

## 2019-12-09 ENCOUNTER — Other Ambulatory Visit: Payer: Self-pay | Admitting: Cardiovascular Disease

## 2019-12-16 ENCOUNTER — Inpatient Hospital Stay: Payer: Medicare HMO

## 2019-12-16 ENCOUNTER — Other Ambulatory Visit: Payer: Self-pay

## 2019-12-16 ENCOUNTER — Telehealth: Payer: Self-pay | Admitting: Internal Medicine

## 2019-12-16 ENCOUNTER — Encounter: Payer: Self-pay | Admitting: Internal Medicine

## 2019-12-16 ENCOUNTER — Inpatient Hospital Stay: Payer: Medicare HMO | Attending: Internal Medicine | Admitting: Internal Medicine

## 2019-12-16 VITALS — BP 129/63 | HR 63 | Temp 97.5°F | Resp 18 | Ht 72.0 in | Wt 206.3 lb

## 2019-12-16 DIAGNOSIS — D75839 Thrombocytosis, unspecified: Secondary | ICD-10-CM | POA: Insufficient documentation

## 2019-12-16 DIAGNOSIS — Z7982 Long term (current) use of aspirin: Secondary | ICD-10-CM | POA: Diagnosis not present

## 2019-12-16 DIAGNOSIS — E785 Hyperlipidemia, unspecified: Secondary | ICD-10-CM | POA: Insufficient documentation

## 2019-12-16 DIAGNOSIS — I10 Essential (primary) hypertension: Secondary | ICD-10-CM | POA: Insufficient documentation

## 2019-12-16 DIAGNOSIS — D45 Polycythemia vera: Secondary | ICD-10-CM | POA: Insufficient documentation

## 2019-12-16 DIAGNOSIS — Z79899 Other long term (current) drug therapy: Secondary | ICD-10-CM | POA: Insufficient documentation

## 2019-12-16 DIAGNOSIS — D471 Chronic myeloproliferative disease: Secondary | ICD-10-CM

## 2019-12-16 DIAGNOSIS — D72829 Elevated white blood cell count, unspecified: Secondary | ICD-10-CM | POA: Insufficient documentation

## 2019-12-16 DIAGNOSIS — E119 Type 2 diabetes mellitus without complications: Secondary | ICD-10-CM | POA: Diagnosis not present

## 2019-12-16 DIAGNOSIS — I2510 Atherosclerotic heart disease of native coronary artery without angina pectoris: Secondary | ICD-10-CM | POA: Insufficient documentation

## 2019-12-16 LAB — CMP (CANCER CENTER ONLY)
ALT: 11 U/L (ref 0–44)
AST: 12 U/L — ABNORMAL LOW (ref 15–41)
Albumin: 3.6 g/dL (ref 3.5–5.0)
Alkaline Phosphatase: 60 U/L (ref 38–126)
Anion gap: 7 (ref 5–15)
BUN: 15 mg/dL (ref 8–23)
CO2: 25 mmol/L (ref 22–32)
Calcium: 9.5 mg/dL (ref 8.9–10.3)
Chloride: 103 mmol/L (ref 98–111)
Creatinine: 1.58 mg/dL — ABNORMAL HIGH (ref 0.61–1.24)
GFR, Estimated: 43 mL/min — ABNORMAL LOW (ref >60)
Glucose, Bld: 105 mg/dL — ABNORMAL HIGH (ref 70–99)
Potassium: 4.6 mmol/L (ref 3.5–5.1)
Sodium: 135 mmol/L (ref 135–145)
Total Bilirubin: 0.7 mg/dL (ref 0.3–1.2)
Total Protein: 8 g/dL (ref 6.5–8.1)

## 2019-12-16 LAB — CBC WITH DIFFERENTIAL (CANCER CENTER ONLY)
Abs Immature Granulocytes: 0.05 10*3/uL (ref 0.00–0.07)
Basophils Absolute: 0.2 10*3/uL — ABNORMAL HIGH (ref 0.0–0.1)
Basophils Relative: 2 %
Eosinophils Absolute: 0.1 10*3/uL (ref 0.0–0.5)
Eosinophils Relative: 1 %
HCT: 46.9 % (ref 39.0–52.0)
Hemoglobin: 14.5 g/dL (ref 13.0–17.0)
Immature Granulocytes: 1 %
Lymphocytes Relative: 15 %
Lymphs Abs: 1.6 10*3/uL (ref 0.7–4.0)
MCH: 27.2 pg (ref 26.0–34.0)
MCHC: 30.9 g/dL (ref 30.0–36.0)
MCV: 87.8 fL (ref 80.0–100.0)
Monocytes Absolute: 1 10*3/uL (ref 0.1–1.0)
Monocytes Relative: 9 %
Neutro Abs: 8 10*3/uL — ABNORMAL HIGH (ref 1.7–7.7)
Neutrophils Relative %: 72 %
Platelet Count: 483 10*3/uL — ABNORMAL HIGH (ref 150–400)
RBC: 5.34 MIL/uL (ref 4.22–5.81)
RDW: 14.3 % (ref 11.5–15.5)
WBC Count: 11.1 10*3/uL — ABNORMAL HIGH (ref 4.0–10.5)
nRBC: 0 % (ref 0.0–0.2)

## 2019-12-16 LAB — LACTATE DEHYDROGENASE: LDH: 217 U/L — ABNORMAL HIGH (ref 98–192)

## 2019-12-16 NOTE — Progress Notes (Signed)
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  PRIOR THERAPY: Phlebotomy on as-needed basis.   CURRENT THERAPY: Hydroxyurea 500 mg by mouth daily.   INTERVAL HISTORY: [name] 86 y.o. male returns to the clinic today for follow-up visit.  The patient is feeling fine today with no concerning complaints.  He denied having any chest pain, shortness of breath, cough or hemoptysis.  He denied having any fever or chills.  He has no nausea, vomiting, diarrhea or constipation.  He denied having any headache or visual changes.  He continues to tolerate his treatment with hydroxyurea fairly well.  He is here today for evaluation and repeat blood work.  MEDICAL HISTORY: Past Medical History:  Diagnosis Date  . Chest pain   . Coronary artery disease   . Diabetes mellitus without complication ([place])   . Hyperlipidemia   . Hypertension     ALLERGIES:  has No Known Allergies.  MEDICATIONS:  Current Outpatient Medications  Medication Sig Dispense Refill  . acetaminophen (TYLENOL) 325 MG tablet Take 650 mg by mouth every 6 (six) hours as needed for moderate pain. Reported on 01/23/2015    . ASPIRIN 81 PO Take by mouth.    Marland Kitchen CINNAMON PO Take 1 capsule by mouth 2 (two) times daily.    . clobetasol cream (TEMOVATE) 0.05 %     . furosemide (LASIX) 40 MG tablet Take 1 tablet (40 mg total) by mouth daily. 90 tablet 3  . gabapentin (NEURONTIN) 100 MG capsule Take 100 mg by mouth 2 (two) times daily.    Marland Kitchen glimepiride (AMARYL) 4 MG tablet Take 4 mg by mouth daily before breakfast.    . hydrocortisone 2.5 % cream APPLY TOPICALLY TO AFFECTED AREA TO RASH  QD PRN  1  . hydroxyurea (HYDREA) 500 MG capsule TAKE 1 CAPSULE BY MOUTH ONCE DAILY. TAKE WITH FOOD TO MINIMIZE GI EFFECTS.  90 capsule 0  . losartan (COZAAR) 100 MG tablet Take 100 mg by mouth daily.    . metoprolol tartrate (LOPRESSOR) 50 MG tablet Take 1 tablet (50 mg total) by mouth 2 (two) times daily. 180 tablet 3  . Multiple Vitamin (MULTIVITAMIN WITH MINERALS) TABS tablet Take 1 tablet by mouth every morning.    . Omega-3 Fatty Acids (FISH OIL) 1200 MG CAPS Take 1 capsule by mouth 2 (two) times daily.    Marland Kitchen omeprazole (PRILOSEC) 20 MG capsule Take 40 mg by mouth daily.     . simvastatin (ZOCOR) 40 MG tablet TAKE 1 TABLET BY MOUTH ONCE DAILY IN THE EVENING 90 tablet 3   No current facility-administered medications for this visit.    SURGICAL HISTORY:  Past Surgical History:  Procedure Laterality Date  . CARDIAC CATHETERIZATION  11/01/2005   patent stents with normal LV function  . CARDIAC CATHETERIZATION  11/08/2003   cypher stent mid dominant right coronary lesion  . CARDIAC SURGERY    . CORONARY ANGIOPLASTY     post LAD and RCA stenting  . DOPPLER ECHOCARDIOGRAPHY  06/18/2010   EF =>55%,LV normal  . EYE SURGERY    . holter monitor  11/08/2005  . NM MYOCAR PERF WALL MOTION  05/23/2008   EF 62% ,norm myocardial perfusion    REVIEW OF SYSTEMS:  A comprehensive review of systems was negative.  PHYSICAL EXAMINATION: General appearance: alert, cooperative and no distress Head: Normocephalic, without obvious abnormality, atraumatic Neck: no adenopathy, no JVD, supple, symmetrical, trachea midline and thyroid not enlarged, symmetric, no tenderness/mass/nodules Lymph nodes: Cervical, supraclavicular, and axillary nodes normal. Resp: clear to auscultation bilaterally Back: symmetric, no curvature. ROM normal. No CVA tenderness. Cardio: regular rate and rhythm, S1, S2 normal, no murmur, click, rub or gallop GI: soft, non-tender; bowel sounds normal; no masses,  no organomegaly Extremities: extremities normal, atraumatic, no cyanosis or edema  ECOG PERFORMANCE STATUS: 1 - Symptomatic but completely  ambulatory  Blood pressure 129/63, pulse 63, temperature (!) 97.5 F (36.4 C), temperature source Tympanic, resp. rate 18, height 6' (1.829 m), weight 206 lb 4.8 oz (93.6 kg), SpO2 100 %.  LABORATORY DATA: Lab Results  Component Value Date   WBC 11.1 (H) 12/16/2019   HGB 14.5 12/16/2019   HCT 46.9 12/16/2019   MCV 87.8 12/16/2019   PLT 483 (H) 12/16/2019      Chemistry      Component Value Date/Time   NA 133 (L) 06/17/2019 0945   NA 137 10/29/2016 1018   K 4.5 06/17/2019 0945   K 4.9 10/29/2016 1018   CL 99 06/17/2019 0945   CO2 25 06/17/2019 0945   CO2 25 10/29/2016 1018   BUN 14 06/17/2019 0945   BUN 13.5 10/29/2016 1018   CREATININE 1.50 (H) 06/17/2019 0945   CREATININE 1.2 10/29/2016 1018      Component Value Date/Time   CALCIUM 9.7 06/17/2019 0945   CALCIUM 9.1 10/29/2016 1018   ALKPHOS 56 06/17/2019 0945   ALKPHOS 62 10/29/2016 1018   AST 16 06/17/2019 0945   AST 18 10/29/2016 1018   ALT 15 06/17/2019 0945   ALT 12 10/29/2016 1018   BILITOT 0.7 06/17/2019 0945   BILITOT 0.46 10/29/2016 1018       RADIOGRAPHIC STUDIES: No results found.  ASSESSMENT AND PLAN:  This is a very pleasant 86 years old white male with history of myeloproliferative disorder, polycythemia vera with positive JAK 2 mutation. He is currently on treatment with hydroxyurea 500 mg p.o. daily and has been tolerating this treatment fairly well with no concerning adverse effects. Repeat CBC today showed mild leukocytosis and thrombocytosis. I recommended for him to continue his current treatment with hydroxyurea with the same dose. I will see the patient back for follow-up visit in 6 months for evaluation with repeat blood work. He was advised to call immediately if he has any concerning symptoms in the interval. The patient voices understanding of current disease status and treatment options and is in agreement with the current care plan. All questions were answered. The patient knows to  call the clinic with any problems, questions or concerns. We can certainly see the patient much sooner if necessary.  Disclaimer: This note was dictated with voice recognition software. Similar sounding words can inadvertently be transcribed and may not be corrected upon review.

## 2019-12-16 NOTE — Telephone Encounter (Signed)
Scheduled per los. Gave avs and calendar  

## 2020-01-06 ENCOUNTER — Other Ambulatory Visit: Payer: Self-pay | Admitting: Internal Medicine

## 2020-01-06 DIAGNOSIS — D75839 Thrombocytosis, unspecified: Secondary | ICD-10-CM

## 2020-01-12 DIAGNOSIS — L570 Actinic keratosis: Secondary | ICD-10-CM | POA: Diagnosis not present

## 2020-01-12 DIAGNOSIS — Z85828 Personal history of other malignant neoplasm of skin: Secondary | ICD-10-CM | POA: Diagnosis not present

## 2020-01-12 DIAGNOSIS — L218 Other seborrheic dermatitis: Secondary | ICD-10-CM | POA: Diagnosis not present

## 2020-01-21 ENCOUNTER — Ambulatory Visit (INDEPENDENT_AMBULATORY_CARE_PROVIDER_SITE_OTHER): Payer: Medicare HMO | Admitting: Cardiovascular Disease

## 2020-01-21 ENCOUNTER — Encounter: Payer: Self-pay | Admitting: Cardiovascular Disease

## 2020-01-21 ENCOUNTER — Other Ambulatory Visit: Payer: Self-pay

## 2020-01-21 VITALS — BP 108/60 | HR 65 | Ht 72.0 in | Wt 206.0 lb

## 2020-01-21 DIAGNOSIS — I10 Essential (primary) hypertension: Secondary | ICD-10-CM

## 2020-01-21 DIAGNOSIS — R600 Localized edema: Secondary | ICD-10-CM

## 2020-01-21 DIAGNOSIS — E785 Hyperlipidemia, unspecified: Secondary | ICD-10-CM | POA: Diagnosis not present

## 2020-01-21 DIAGNOSIS — I2510 Atherosclerotic heart disease of native coronary artery without angina pectoris: Secondary | ICD-10-CM | POA: Diagnosis not present

## 2020-01-21 NOTE — Assessment & Plan Note (Signed)
History of essential hypertension blood pressure measured today at 108/60.  He is on losartan and metoprolol

## 2020-01-21 NOTE — Progress Notes (Signed)
01/21/2020 [name]   [birth year]  [id number]  Primary Physician [name], MD Primary Cardiologist: Lorretta Harp MD FACP, [place], [place], [place]  HPI:  [name] is a 87 y.o.  moderately overweight widowed Caucasian male (wife died 08-25-2010 at age 24), father of 66, grandfather to 76 grandchildren who I last sawin the office 06/11/2019.He has a history of CAD status post LAD and RCA stenting in the past. He was last catheterized by Dr. Melvern Banker in 2007 and found to have patent stents with normal LV function. His other problems include hypertension, hyperlipidemia and diabetes. He does not smoke or drink.Since I saw him 11/26/13 he has been relatively asymptomatic. He had a Myoview stress test and 2-D echo performed in October 2014 year which were normal. He has also gotten engaged since I last saw him and remains engaged to this day. He complains of erectile dysfunction.  Since I saw him 6 months ago he is done well .  His edema is controlled on 40 of Lasix and salt restriction.  He walks with a walker.  He denies chest pain or shortness of breath.    Current Meds  Medication Sig  . acetaminophen (TYLENOL) 325 MG tablet Take 650 mg by mouth every 6 (six) hours as needed for moderate pain. Reported on 01/23/2015  . ASPIRIN 81 PO Take by mouth.  Marland Kitchen CINNAMON PO Take 1 capsule by mouth 2 (two) times daily.  . clobetasol cream (TEMOVATE) 0.05 %   . furosemide (LASIX) 40 MG tablet Take 1 tablet (40 mg total) by mouth daily.  Marland Kitchen gabapentin (NEURONTIN) 100 MG capsule Take 100 mg by mouth 2 (two) times daily.  Marland Kitchen glimepiride (AMARYL) 4 MG tablet Take 4 mg by mouth daily before breakfast.  . hydrocortisone 2.5 % cream APPLY TOPICALLY TO AFFECTED AREA TO RASH  QD PRN  . hydroxyurea (HYDREA) 500 MG capsule TAKE 1 CAPSULE BY MOUTH ONCE DAILY WITH FOOD TO  MINIMIZE  GI  EFFECTS  . losartan (COZAAR) 100 MG tablet Take 100 mg by mouth daily.  . metoprolol tartrate (LOPRESSOR) 50 MG  tablet Take 1 tablet (50 mg total) by mouth 2 (two) times daily.  . Multiple Vitamin (MULTIVITAMIN WITH MINERALS) TABS tablet Take 1 tablet by mouth every morning.  . Omega-3 Fatty Acids (FISH OIL) 1200 MG CAPS Take 1 capsule by mouth 2 (two) times daily.  Marland Kitchen omeprazole (PRILOSEC) 20 MG capsule Take 40 mg by mouth daily.   . simvastatin (ZOCOR) 40 MG tablet TAKE 1 TABLET BY MOUTH ONCE DAILY IN THE EVENING     No Known Allergies  Social History   Socioeconomic History  . Marital status: Married    Spouse name: Not on file  . Number of children: 4  . Years of education: 61  . Highest education level: Not on file  Occupational History  . Not on file  Tobacco Use  . Smoking status: Former Smoker    Types: Cigarettes  . Smokeless tobacco: Former Systems developer    Quit date: 10/31/1982  Substance and Sexual Activity  . Alcohol use: Yes    Comment: 1 glass wine daily  . Drug use: No  . Sexual activity: Never  Other Topics Concern  . Not on file  Social History Narrative  . Not on file   Social Determinants of Health   Financial Resource Strain: Not on file  Food Insecurity: Not on file  Transportation Needs: Not on file  Physical Activity: Not on file  Stress: Not on file  Social Connections: Not on file  Intimate Partner Violence: Not on file     Review of Systems: General: negative for chills, fever, night sweats or weight changes.  Cardiovascular: negative for chest pain, dyspnea on exertion, edema, orthopnea, palpitations, paroxysmal nocturnal dyspnea or shortness of breath Dermatological: negative for rash Respiratory: negative for cough or wheezing Urologic: negative for hematuria Abdominal: negative for nausea, vomiting, diarrhea, bright red blood per rectum, melena, or hematemesis Neurologic: negative for visual changes, syncope, or dizziness All other systems reviewed and are otherwise negative except as noted above.    Blood pressure 108/60, pulse 65, height 6' (1.829  m), weight 206 lb (93.4 kg).  General appearance: alert and no distress Neck: no adenopathy, no carotid bruit, no JVD, supple, symmetrical, trachea midline and thyroid not enlarged, symmetric, no tenderness/mass/nodules Lungs: clear to auscultation bilaterally Heart: regular rate and rhythm, S1, S2 normal, no murmur, click, rub or gallop Extremities: extremities normal, atraumatic, no cyanosis or edema Pulses: 2+ and symmetric Skin: Skin color, texture, turgor normal. No rashes or lesions Neurologic: Alert and oriented X 3, normal strength and tone. Normal symmetric reflexes. Normal coordination and gait  EKG sinus rhythm at 65 with septal Q waves.  I personally reviewed this EKG.  ASSESSMENT AND PLAN:   HYPERLIPIDEMIA History of hyperlipidemia on statin therapy followed by his PCP  Essential hypertension History of essential hypertension blood pressure measured today at 108/60.  He is on losartan and metoprolol  Coronary atherosclerosis History of CAD status post remote LAD and RCA stenting.  His last cardiac catheterization performed by Dr. Megan Salon in 2007 revealed patent stents with normal LV function.  He had a 2D echo and a Myoview October 2014 which were normal.  He is asymptomatic.  Bilateral lower extremity edema Bilateral lower extremity edema stable on furosemide 40 mg a day and salt restriction.      Lorretta Harp MD FACP,FACC,FAHA, [facility] 01/21/2020 10:20 AM

## 2020-01-21 NOTE — Assessment & Plan Note (Signed)
Bilateral lower extremity edema stable on furosemide 40 mg a day and salt restriction.

## 2020-01-21 NOTE — Assessment & Plan Note (Signed)
History of hyperlipidemia on statin therapy followed by his PCP 

## 2020-01-21 NOTE — Patient Instructions (Signed)

## 2020-01-21 NOTE — Assessment & Plan Note (Signed)
History of CAD status post remote LAD and RCA stenting.  His last cardiac catheterization performed by Dr. Megan Salon in 2007 revealed patent stents with normal LV function.  He had a 2D echo and a Myoview October 2014 which were normal.  He is asymptomatic.

## 2020-02-24 DIAGNOSIS — E039 Hypothyroidism, unspecified: Secondary | ICD-10-CM | POA: Diagnosis not present

## 2020-02-24 DIAGNOSIS — E119 Type 2 diabetes mellitus without complications: Secondary | ICD-10-CM | POA: Diagnosis not present

## 2020-02-24 DIAGNOSIS — E785 Hyperlipidemia, unspecified: Secondary | ICD-10-CM | POA: Diagnosis not present

## 2020-03-03 DIAGNOSIS — Z23 Encounter for immunization: Secondary | ICD-10-CM | POA: Diagnosis not present

## 2020-03-03 DIAGNOSIS — D473 Essential (hemorrhagic) thrombocythemia: Secondary | ICD-10-CM | POA: Diagnosis not present

## 2020-03-03 DIAGNOSIS — E1159 Type 2 diabetes mellitus with other circulatory complications: Secondary | ICD-10-CM | POA: Diagnosis not present

## 2020-03-03 DIAGNOSIS — I2510 Atherosclerotic heart disease of native coronary artery without angina pectoris: Secondary | ICD-10-CM | POA: Diagnosis not present

## 2020-03-03 DIAGNOSIS — E039 Hypothyroidism, unspecified: Secondary | ICD-10-CM | POA: Diagnosis not present

## 2020-03-03 DIAGNOSIS — E1122 Type 2 diabetes mellitus with diabetic chronic kidney disease: Secondary | ICD-10-CM | POA: Diagnosis not present

## 2020-03-03 DIAGNOSIS — E785 Hyperlipidemia, unspecified: Secondary | ICD-10-CM | POA: Diagnosis not present

## 2020-03-03 DIAGNOSIS — K219 Gastro-esophageal reflux disease without esophagitis: Secondary | ICD-10-CM | POA: Diagnosis not present

## 2020-04-07 ENCOUNTER — Other Ambulatory Visit: Payer: Self-pay | Admitting: Internal Medicine

## 2020-04-07 DIAGNOSIS — D75839 Thrombocytosis, unspecified: Secondary | ICD-10-CM

## 2020-06-08 ENCOUNTER — Other Ambulatory Visit: Payer: Self-pay | Admitting: Cardiovascular Disease

## 2020-06-12 ENCOUNTER — Telehealth: Payer: Self-pay | Admitting: Cardiovascular Disease

## 2020-06-12 NOTE — Telephone Encounter (Signed)
Spoke with patient regarding the Friday 07/28/20 12:00pm CTA chest/aorta scheduled at [facility]---arrival time is 11:30 am--1st floor admissions office for check in---liquids only 4 hours prior to study---patient to come in Friday 07/21/20 for labs.  Will mail information to patient and he voiced his understanding.Marland Kitchen

## 2020-06-13 DIAGNOSIS — H524 Presbyopia: Secondary | ICD-10-CM | POA: Diagnosis not present

## 2020-06-13 DIAGNOSIS — E119 Type 2 diabetes mellitus without complications: Secondary | ICD-10-CM | POA: Diagnosis not present

## 2020-06-13 DIAGNOSIS — Z961 Presence of intraocular lens: Secondary | ICD-10-CM | POA: Diagnosis not present

## 2020-06-13 DIAGNOSIS — H43811 Vitreous degeneration, right eye: Secondary | ICD-10-CM | POA: Diagnosis not present

## 2020-06-15 ENCOUNTER — Other Ambulatory Visit: Payer: Self-pay

## 2020-06-15 ENCOUNTER — Inpatient Hospital Stay: Payer: Medicare HMO | Attending: Internal Medicine | Admitting: Internal Medicine

## 2020-06-15 ENCOUNTER — Inpatient Hospital Stay: Payer: Medicare HMO

## 2020-06-15 VITALS — BP 116/59 | HR 63 | Temp 96.9°F | Resp 19 | Ht 72.0 in | Wt 200.5 lb

## 2020-06-15 DIAGNOSIS — Z79899 Other long term (current) drug therapy: Secondary | ICD-10-CM | POA: Diagnosis not present

## 2020-06-15 DIAGNOSIS — E785 Hyperlipidemia, unspecified: Secondary | ICD-10-CM | POA: Diagnosis not present

## 2020-06-15 DIAGNOSIS — D75839 Thrombocytosis, unspecified: Secondary | ICD-10-CM

## 2020-06-15 DIAGNOSIS — D471 Chronic myeloproliferative disease: Secondary | ICD-10-CM

## 2020-06-15 DIAGNOSIS — E119 Type 2 diabetes mellitus without complications: Secondary | ICD-10-CM | POA: Insufficient documentation

## 2020-06-15 DIAGNOSIS — I10 Essential (primary) hypertension: Secondary | ICD-10-CM | POA: Diagnosis not present

## 2020-06-15 DIAGNOSIS — D45 Polycythemia vera: Secondary | ICD-10-CM | POA: Insufficient documentation

## 2020-06-15 DIAGNOSIS — I2510 Atherosclerotic heart disease of native coronary artery without angina pectoris: Secondary | ICD-10-CM | POA: Diagnosis not present

## 2020-06-15 LAB — CBC WITH DIFFERENTIAL (CANCER CENTER ONLY)
Abs Immature Granulocytes: 0.05 10*3/uL (ref 0.00–0.07)
Basophils Absolute: 0.2 10*3/uL — ABNORMAL HIGH (ref 0.0–0.1)
Basophils Relative: 2 %
Eosinophils Absolute: 0.2 10*3/uL (ref 0.0–0.5)
Eosinophils Relative: 2 %
HCT: 44.1 % (ref 39.0–52.0)
Hemoglobin: 13.7 g/dL (ref 13.0–17.0)
Immature Granulocytes: 1 %
Lymphocytes Relative: 14 %
Lymphs Abs: 1.5 10*3/uL (ref 0.7–4.0)
MCH: 27 pg (ref 26.0–34.0)
MCHC: 31.1 g/dL (ref 30.0–36.0)
MCV: 86.8 fL (ref 80.0–100.0)
Monocytes Absolute: 0.8 10*3/uL (ref 0.1–1.0)
Monocytes Relative: 8 %
Neutro Abs: 7.6 10*3/uL (ref 1.7–7.7)
Neutrophils Relative %: 73 %
Platelet Count: 470 10*3/uL — ABNORMAL HIGH (ref 150–400)
RBC: 5.08 MIL/uL (ref 4.22–5.81)
RDW: 14.5 % (ref 11.5–15.5)
WBC Count: 10.4 10*3/uL (ref 4.0–10.5)
nRBC: 0 % (ref 0.0–0.2)

## 2020-06-15 LAB — CMP (CANCER CENTER ONLY)
ALT: 9 U/L (ref 0–44)
AST: 12 U/L — ABNORMAL LOW (ref 15–41)
Albumin: 3.5 g/dL (ref 3.5–5.0)
Alkaline Phosphatase: 50 U/L (ref 38–126)
Anion gap: 9 (ref 5–15)
BUN: 16 mg/dL (ref 8–23)
CO2: 24 mmol/L (ref 22–32)
Calcium: 9.1 mg/dL (ref 8.9–10.3)
Chloride: 105 mmol/L (ref 98–111)
Creatinine: 1.44 mg/dL — ABNORMAL HIGH (ref 0.61–1.24)
GFR, Estimated: 48 mL/min — ABNORMAL LOW (ref >60)
Glucose, Bld: 131 mg/dL — ABNORMAL HIGH (ref 70–99)
Potassium: 4.5 mmol/L (ref 3.5–5.1)
Sodium: 138 mmol/L (ref 135–145)
Total Bilirubin: 0.7 mg/dL (ref 0.3–1.2)
Total Protein: 7.6 g/dL (ref 6.5–8.1)

## 2020-06-15 LAB — LACTATE DEHYDROGENASE: LDH: 145 U/L (ref 98–192)

## 2020-06-15 NOTE — Progress Notes (Signed)
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  PRIOR THERAPY: Phlebotomy on as-needed basis.   CURRENT THERAPY: Hydroxyurea 500 mg by mouth daily.   INTERVAL HISTORY: [name] [name] 87 y.o. [name] returns to the clinic today for 33-month follow-up visit.  The patient is feeling fine today with no concerning complaints.  There is no change in his health status in the last 6 months.  He denied having any current chest pain, shortness of breath, cough or hemoptysis.  He has no nausea, vomiting, diarrhea or constipation.  He has no recent weight loss or night sweats.  He has no bleeding, bruises or ecchymosis.  He continues to tolerate his treatment with hydroxyurea fairly well.  The patient is here today for evaluation and repeat blood work.  MEDICAL HISTORY: Past Medical History:  Diagnosis Date   Chest pain    Coronary artery disease    Diabetes mellitus without complication ([place])    Hyperlipidemia    Hypertension     ALLERGIES:  has No Known Allergies.  MEDICATIONS:  Current Outpatient Medications  Medication Sig Dispense Refill   acetaminophen (TYLENOL) 325 MG tablet Take 650 mg by mouth every 6 (six) hours as needed for moderate pain. Reported on 01/23/2015     ASPIRIN 81 PO Take by mouth.     CINNAMON PO Take 1 capsule by mouth 2 (two) times daily.     clobetasol cream (TEMOVATE) 0.05 %      furosemide (LASIX) 40 MG tablet Take 1 tablet (40 mg total) by mouth daily. 90 tablet 3   gabapentin (NEURONTIN) 100 MG capsule Take 100 mg by mouth 2 (two) times daily.     glimepiride (AMARYL) 4 MG tablet Take 4 mg by mouth daily before breakfast.     hydrocortisone 2.5 % cream APPLY TOPICALLY TO AFFECTED AREA TO RASH  QD PRN  1   hydroxyurea (HYDREA) 500 MG capsule  TAKE 1 CAPSULE BY MOUTH ONCE DAILY WITH FOOD TO MINIMIZE GI EFFECTS. 90 capsule 0   losartan (COZAAR) 100 MG tablet Take 100 mg by mouth daily.     metoprolol tartrate (LOPRESSOR) 50 MG tablet Take 1 tablet by mouth twice daily 180 tablet 3   Multiple Vitamin (MULTIVITAMIN WITH MINERALS) TABS tablet Take 1 tablet by mouth every morning.     Omega-3 Fatty Acids (FISH OIL) 1200 MG CAPS Take 1 capsule by mouth 2 (two) times daily.     omeprazole (PRILOSEC) 20 MG capsule Take 40 mg by mouth daily.      simvastatin (ZOCOR) 40 MG tablet TAKE 1 TABLET BY MOUTH ONCE DAILY IN THE EVENING 90 tablet 3   No current facility-administered medications for this visit.    SURGICAL HISTORY:  Past Surgical History:  Procedure Laterality Date   CARDIAC CATHETERIZATION  11/01/2005   patent stents with normal LV function   CARDIAC CATHETERIZATION  11/08/2003   cypher stent mid dominant right coronary lesion   CARDIAC SURGERY     CORONARY ANGIOPLASTY     post LAD and RCA stenting   DOPPLER ECHOCARDIOGRAPHY  06/18/2010   EF =>55%,LV normal   EYE SURGERY     holter monitor  11/08/2005   NM MYOCAR PERF WALL MOTION  05/23/2008   EF 62% ,norm myocardial perfusion  REVIEW OF SYSTEMS:  A comprehensive review of systems was negative except for: Constitutional: positive for fatigue   PHYSICAL EXAMINATION: General appearance: alert, cooperative and no distress Head: Normocephalic, without obvious abnormality, atraumatic Neck: no adenopathy, no JVD, supple, symmetrical, trachea midline and thyroid not enlarged, symmetric, no tenderness/mass/nodules Lymph nodes: Cervical, supraclavicular, and axillary nodes normal. Resp: clear to auscultation bilaterally Back: symmetric, no curvature. ROM normal. No CVA tenderness. Cardio: regular rate and rhythm, S1, S2 normal, no murmur, click, rub or gallop GI: soft, non-tender; bowel sounds normal; no masses,  no organomegaly Extremities: extremities normal, atraumatic, no  cyanosis or edema  ECOG PERFORMANCE STATUS: 1 - Symptomatic but completely ambulatory  Blood pressure (!) 116/59, pulse 63, temperature (!) 96.9 F (36.1 C), temperature source Tympanic, resp. rate 19, height 6' (1.829 m), weight 200 lb 8 oz (90.9 kg), SpO2 98 %.  LABORATORY DATA: Lab Results  Component Value Date   WBC 10.4 06/15/2020   HGB 13.7 06/15/2020   HCT 44.1 06/15/2020   MCV 86.8 06/15/2020   PLT 470 (H) 06/15/2020      Chemistry      Component Value Date/Time   NA 138 06/15/2020 1003   NA 137 10/29/2016 1018   K 4.5 06/15/2020 1003   K 4.9 10/29/2016 1018   CL 105 06/15/2020 1003   CO2 24 06/15/2020 1003   CO2 25 10/29/2016 1018   BUN 16 06/15/2020 1003   BUN 13.5 10/29/2016 1018   CREATININE 1.44 (H) 06/15/2020 1003   CREATININE 1.2 10/29/2016 1018      Component Value Date/Time   CALCIUM 9.1 06/15/2020 1003   CALCIUM 9.1 10/29/2016 1018   ALKPHOS 50 06/15/2020 1003   ALKPHOS 62 10/29/2016 1018   AST 12 (L) 06/15/2020 1003   AST 18 10/29/2016 1018   ALT 9 06/15/2020 1003   ALT 12 10/29/2016 1018   BILITOT 0.7 06/15/2020 1003   BILITOT 0.46 10/29/2016 1018       RADIOGRAPHIC STUDIES: No results found.  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 The patient is currently on treatment with hydroxyurea 500 mg p.o. daily.  He continues to tolerate this treatment well with no concerning adverse effects. Repeat CBC today showed stable platelets count and no evidence of anemia or leukocytosis. I recommended for the patient to continue his current treatment with hydroxyurea with the same dose. I will see him back for follow-up visit in 6 months for evaluation and repeat CBC, comprehensive metabolic panel and LDH. The patient was advised to call immediately if he has any concerning symptoms in the interval The patient voices understanding of  current disease status and treatment options and is in agreement with the current care plan. All questions were answered. The patient knows to call the clinic with any problems, questions or concerns. We can certainly see the patient much sooner if necessary.  Disclaimer: This note was dictated with voice recognition software. Similar sounding words can inadvertently be transcribed and may not be corrected upon review.

## 2020-06-20 ENCOUNTER — Ambulatory Visit (HOSPITAL_COMMUNITY): Payer: Medicare HMO | Attending: Internal Medicine

## 2020-06-20 ENCOUNTER — Other Ambulatory Visit: Payer: Self-pay

## 2020-06-20 DIAGNOSIS — I77810 Thoracic aortic ectasia: Secondary | ICD-10-CM | POA: Insufficient documentation

## 2020-06-20 DIAGNOSIS — Z87891 Personal history of nicotine dependence: Secondary | ICD-10-CM | POA: Insufficient documentation

## 2020-06-20 DIAGNOSIS — I350 Nonrheumatic aortic (valve) stenosis: Secondary | ICD-10-CM | POA: Diagnosis not present

## 2020-06-20 DIAGNOSIS — I951 Orthostatic hypotension: Secondary | ICD-10-CM | POA: Insufficient documentation

## 2020-06-20 DIAGNOSIS — E119 Type 2 diabetes mellitus without complications: Secondary | ICD-10-CM | POA: Diagnosis not present

## 2020-06-20 DIAGNOSIS — R600 Localized edema: Secondary | ICD-10-CM | POA: Diagnosis not present

## 2020-06-20 DIAGNOSIS — I517 Cardiomegaly: Secondary | ICD-10-CM | POA: Diagnosis not present

## 2020-06-20 DIAGNOSIS — I10 Essential (primary) hypertension: Secondary | ICD-10-CM | POA: Diagnosis not present

## 2020-06-20 DIAGNOSIS — E785 Hyperlipidemia, unspecified: Secondary | ICD-10-CM | POA: Insufficient documentation

## 2020-06-20 LAB — ECHOCARDIOGRAM COMPLETE
Area-P 1/2: 2.62 cm2
S' Lateral: 2.7 cm

## 2020-07-06 ENCOUNTER — Other Ambulatory Visit: Payer: Self-pay | Admitting: Internal Medicine

## 2020-07-06 DIAGNOSIS — D75839 Thrombocytosis, unspecified: Secondary | ICD-10-CM

## 2020-07-07 ENCOUNTER — Encounter: Payer: Self-pay | Admitting: Internal Medicine

## 2020-07-12 DIAGNOSIS — L821 Other seborrheic keratosis: Secondary | ICD-10-CM | POA: Diagnosis not present

## 2020-07-12 DIAGNOSIS — L570 Actinic keratosis: Secondary | ICD-10-CM | POA: Diagnosis not present

## 2020-07-12 DIAGNOSIS — D225 Melanocytic nevi of trunk: Secondary | ICD-10-CM | POA: Diagnosis not present

## 2020-07-12 DIAGNOSIS — Z85828 Personal history of other malignant neoplasm of skin: Secondary | ICD-10-CM | POA: Diagnosis not present

## 2020-07-28 ENCOUNTER — Encounter (HOSPITAL_COMMUNITY): Payer: Self-pay

## 2020-07-28 ENCOUNTER — Telehealth: Payer: Self-pay | Admitting: Cardiovascular Disease

## 2020-07-28 ENCOUNTER — Other Ambulatory Visit: Payer: Self-pay

## 2020-07-28 ENCOUNTER — Ambulatory Visit (HOSPITAL_COMMUNITY)
Admission: RE | Admit: 2020-07-28 | Discharge: 2020-07-28 | Disposition: A | Discharge status: Home / Self Care | Payer: Medicare HMO | Source: Ambulatory Visit | Attending: Cardiovascular Disease | Admitting: Cardiovascular Disease

## 2020-07-28 DIAGNOSIS — I712 Thoracic aortic aneurysm, without rupture, unspecified: Secondary | ICD-10-CM

## 2020-07-28 LAB — POCT I-STAT CREATININE: Creatinine, Ser: 2.8 mg/dL — ABNORMAL HIGH (ref 0.61–1.24)

## 2020-07-28 MED ORDER — IOHEXOL 350 MG/ML SOLN: 100.0000 mL | Freq: Once | INTRAVENOUS | Status: DC | PRN

## 2020-07-28 NOTE — Telephone Encounter (Signed)
Patient had an increase in his thoracic aorta on recent echo and was sent for CT scanning to size aorta. Will make dr berry aware.

## 2020-07-28 NOTE — Telephone Encounter (Signed)
CT Dept at [facility] stated that the patient's creatinine level came back as a 2.1,therefore they cannot do the CT. They are sending the pt home.

## 2020-08-07 DIAGNOSIS — D473 Essential (hemorrhagic) thrombocythemia: Secondary | ICD-10-CM | POA: Diagnosis not present

## 2020-08-07 DIAGNOSIS — E039 Hypothyroidism, unspecified: Secondary | ICD-10-CM | POA: Diagnosis not present

## 2020-08-07 DIAGNOSIS — E785 Hyperlipidemia, unspecified: Secondary | ICD-10-CM | POA: Diagnosis not present

## 2020-08-07 DIAGNOSIS — E1122 Type 2 diabetes mellitus with diabetic chronic kidney disease: Secondary | ICD-10-CM | POA: Diagnosis not present

## 2020-08-07 DIAGNOSIS — N179 Acute kidney failure, unspecified: Secondary | ICD-10-CM | POA: Diagnosis not present

## 2020-08-07 DIAGNOSIS — R1310 Dysphagia, unspecified: Secondary | ICD-10-CM | POA: Diagnosis not present

## 2020-08-16 ENCOUNTER — Encounter: Payer: Self-pay | Admitting: Cardiovascular Disease

## 2020-08-16 ENCOUNTER — Other Ambulatory Visit: Payer: Self-pay

## 2020-08-16 ENCOUNTER — Ambulatory Visit (INDEPENDENT_AMBULATORY_CARE_PROVIDER_SITE_OTHER): Payer: Medicare HMO | Admitting: Cardiovascular Disease

## 2020-08-16 VITALS — BP 100/62 | HR 68 | Ht 72.0 in | Wt 194.6 lb

## 2020-08-16 DIAGNOSIS — I2510 Atherosclerotic heart disease of native coronary artery without angina pectoris: Secondary | ICD-10-CM

## 2020-08-16 DIAGNOSIS — I712 Thoracic aortic aneurysm, without rupture, unspecified: Secondary | ICD-10-CM | POA: Insufficient documentation

## 2020-08-16 NOTE — Assessment & Plan Note (Signed)
History of hyperlipidemia on statin therapy followed by his PCP 

## 2020-08-16 NOTE — Assessment & Plan Note (Signed)
History of CAD status post LAD and RCA stenting in the past.  His last catheterization was performed by Dr. Melvern Banker in 2007.  He was found to have patent stents at that time.  Myoview performed in 2014 was nonischemic.  He denies chest pain.

## 2020-08-16 NOTE — Patient Instructions (Signed)
Medication Instructions:  The current medical regimen is effective;  continue present plan and medications.  *If you need a refill on your cardiac medications before your next appointment, please call your pharmacy*   Testing/Procedures: Echocardiogram (12 months) - Your physician has requested that you have an echocardiogram. Echocardiography is a painless test that uses sound waves to create images of your heart. It provides your doctor with information about the size and shape of your heart and how well your heart's chambers and valves are working. This procedure takes approximately one hour. There are no restrictions for this procedure. This will be performed at our [facility] location - [address].    Follow-Up: At [facility], you and your health needs are our priority.  As part of our continuing mission to provide you with exceptional heart care, we have created designated Provider Care Teams.  These Care Teams include your primary Cardiologist (physician) and Advanced Practice Providers (APPs -  Physician Assistants and Nurse Practitioners) who all work together to provide you with the care you need, when you need it.  We recommend signing up for the patient portal called "MyChart".  Sign up information is provided on this After Visit Summary.  MyChart is used to connect with patients for Virtual Visits (Telemedicine).  Patients are able to view lab/test results, encounter notes, upcoming appointments, etc.  Non-urgent messages can be sent to your provider as well.   To learn more about what you can do with MyChart, go to [email or web address].    Your next appointment:   6 month(s)  The format for your next appointment:   In Person  Provider:   You will see one of the following Advanced Practice Providers on your designated Care Team:   Sande Rives, PA-C Coletta Memos, FNP  Then, Quay Burow, MD will plan to see you again in 12 month(s).   Other  Instructions [facility]

## 2020-08-16 NOTE — Progress Notes (Signed)
08/16/2020 Doug A Lapuma   02-10-1932  [id number]  Primary Physician [name], DO Primary Cardiologist: Lorretta Harp MD Garret Reddish, [place], [place]  HPI:  [name] is a 87 y.o.  moderately overweight widowed Caucasian male (wife died 09/21/10 at age 28), father of 61, grandfather to 6 grandchildren who I last saw in the office 01/21/2020. He has a history of CAD status post LAD and RCA stenting in the past. He was last catheterized by Dr. Melvern Banker in 2007 and found to have patent stents with normal LV function. His other problems include hypertension, hyperlipidemia and diabetes. He does not smoke or drink.Since I saw him 11/26/13 he has been relatively asymptomatic. He had a Myoview stress test and 2-D echo performed in October 2014 year which were normal. He complains of erectile dysfunction.   Since I saw him 6 months ago he is done well .  He has gotten married 3 weeks ago after being engaged in sinus saw him last.  His edema is controlled on 40 of Lasix and salt restriction.  He walks with a walker.  He denies chest pain or shortness of breath.  A 2D echocardiogram performed 06/20/2020 showed normal LV systolic function with moderate basal septal hypertrophy, grade 1 diastolic dysfunction and a 50 mm ascending thoracic aortic aneurysm.  I did attempt to get a CTA but unfortunately his renal function w precluded this.  He is also complained of a small amount of skin breakdown on his right heel although he does have a 2+ pedal pulse on that side.     Current Meds  Medication Sig   acetaminophen (TYLENOL) 325 MG tablet Take 650 mg by mouth every 6 (six) hours as needed for moderate pain. Reported on 01/23/2015   ASPIRIN 81 PO Take by mouth.   CINNAMON PO Take 1 capsule by mouth 2 (two) times daily.   clobetasol cream (TEMOVATE) 0.05 %    furosemide (LASIX) 40 MG tablet Take 1 tablet (40 mg total) by mouth daily.   gabapentin (NEURONTIN) 100 MG capsule Take 100 mg by mouth 2  (two) times daily.   glimepiride (AMARYL) 4 MG tablet Take 4 mg by mouth daily before breakfast.   hydrocortisone 2.5 % cream APPLY TOPICALLY TO AFFECTED AREA TO RASH  QD PRN   hydroxyurea (HYDREA) 500 MG capsule TAKE 1 CAPSULE BY MOUTH ONCE DAILY WITH FOOD TO  MINIMIZE  GI  EFFECTS.   ketoconazole (NIZORAL) 2 % shampoo Apply topically 2 (two) times a week.   losartan (COZAAR) 100 MG tablet Take 100 mg by mouth daily.   metoprolol tartrate (LOPRESSOR) 50 MG tablet Take 1 tablet by mouth twice daily   Multiple Vitamin (MULTIVITAMIN WITH MINERALS) TABS tablet Take 1 tablet by mouth every morning.   Omega-3 Fatty Acids (FISH OIL) 1200 MG CAPS Take 1 capsule by mouth 2 (two) times daily.   omeprazole (PRILOSEC) 40 MG capsule Take 40 mg by mouth daily.   simvastatin (ZOCOR) 40 MG tablet TAKE 1 TABLET BY MOUTH ONCE DAILY IN THE EVENING     No Known Allergies  Social History   Socioeconomic History   Marital status: Significant Other    Spouse name: Not on file   Number of children: 4   Years of education: 52   Highest education level: Not on file  Occupational History   Not on file  Tobacco Use   Smoking status: Former    Types: Cigarettes  Smokeless tobacco: Former    Quit date: 10/31/1982  Substance and Sexual Activity   Alcohol use: Yes    Comment: 1 glass wine daily   Drug use: No   Sexual activity: Never  Other Topics Concern   Not on file  Social History Narrative   Not on file   Social Determinants of Health   Financial Resource Strain: Not on file  Food Insecurity: Not on file  Transportation Needs: Not on file  Physical Activity: Not on file  Stress: Not on file  Social Connections: Not on file  Intimate Partner Violence: Not on file     Review of Systems: General: negative for chills, fever, night sweats or weight changes.  Cardiovascular: negative for chest pain, dyspnea on exertion, edema, orthopnea, palpitations, paroxysmal nocturnal dyspnea or shortness  of breath Dermatological: negative for rash Respiratory: negative for cough or wheezing Urologic: negative for hematuria Abdominal: negative for nausea, vomiting, diarrhea, bright red blood per rectum, melena, or hematemesis Neurologic: negative for visual changes, syncope, or dizziness All other systems reviewed and are otherwise negative except as noted above.    Blood pressure 100/62, pulse 68, height 6' (1.829 m), weight 194 lb 9.6 oz (88.3 kg), SpO2 98 %.  General appearance: alert and no distress Neck: no adenopathy, no carotid bruit, no JVD, supple, symmetrical, trachea midline, and thyroid not enlarged, symmetric, no tenderness/mass/nodules Lungs: clear to auscultation bilaterally Heart: regular rate and rhythm, S1, S2 normal, no murmur, click, rub or gallop Extremities: extremities normal, atraumatic, no cyanosis or edema Pulses: 2+ and symmetric Skin: Skin breakdown without ulcer right heel Neurologic: Grossly normal  EKG sinus rhythm at 68 without ST or T wave changes.  Personally reviewed this EKG..  ASSESSMENT AND PLAN:   HYPERLIPIDEMIA History of hyperlipidemia on statin therapy followed by his PCP  Essential hypertension History of essential hypertension a blood pressure measured at 100/62.  He is on metoprolol.  Coronary atherosclerosis History of CAD status post LAD and RCA stenting in the past.  His last catheterization was performed by Dr. Melvern Banker in 2007.  He was found to have patent stents at that time.  Myoview performed in 2014 was nonischemic.  He denies chest pain.  Thoracic aortic aneurysm ([place]) 2D echocardiogram performed 06/20/2020 showed normal LV systolic function with grade 1 diastolic dysfunction and a 50 mm ascending thoracic aortic aneurysm.  I did attempt to get a CTA but this was not done because of renal insufficiency.  We will follow this up in 1 year.  Given his age however, I do not think he is a candidate for surgical repair.Lorretta Harp MD FACP,FACC,FAHA, [facility] 08/16/2020 10:54 AM

## 2020-08-16 NOTE — Assessment & Plan Note (Signed)
History of essential hypertension a blood pressure measured at 100/62.  He is on metoprolol.

## 2020-08-16 NOTE — Assessment & Plan Note (Signed)
2D echocardiogram performed 06/20/2020 showed normal LV systolic function with grade 1 diastolic dysfunction and a 50 mm ascending thoracic aortic aneurysm.  I did attempt to get a CTA but this was not done because of renal insufficiency.  We will follow this up in 1 year.  Given his age however, I do not think he is a candidate for surgical repair.[name]

## 2020-08-17 ENCOUNTER — Encounter: Payer: Self-pay | Admitting: Internal Medicine

## 2020-08-18 ENCOUNTER — Other Ambulatory Visit: Payer: Self-pay

## 2020-08-18 ENCOUNTER — Ambulatory Visit (INDEPENDENT_AMBULATORY_CARE_PROVIDER_SITE_OTHER): Payer: Medicare HMO | Admitting: Podiatry

## 2020-08-18 DIAGNOSIS — L853 Xerosis cutis: Secondary | ICD-10-CM

## 2020-08-18 DIAGNOSIS — M79671 Pain in right foot: Secondary | ICD-10-CM | POA: Diagnosis not present

## 2020-08-18 DIAGNOSIS — R234 Changes in skin texture: Secondary | ICD-10-CM

## 2020-08-18 MED ORDER — SILVER SULFADIAZINE 1 % EX CREA: TOPICAL_CREAM | CUTANEOUS | 0 refills | Status: DC

## 2020-08-18 NOTE — Progress Notes (Signed)
  Subjective:  Patient ID: [name], male    DOB: 10-09-1932,  MRN: [id number]  Chief Complaint  Patient presents with   Wound Check    Right plantar heel cracked/bleeding area 3 week duration.   87 y.o. male presents with the above complaint. History confirmed with patient.   Objective:  Physical Exam: warm, good capillary refill, no trophic changes or ulcerative lesions, normal DP and PT pulses, and normal sensory exam.  Right Foot: Skin fissure plantar posterior heel with small wound without warmth, erythema, SOI. There is local xerosis    Assessment:   1. Skin fissure   2. Pain of right heel   3. Xerosis cutis    Plan:  Patient was evaluated and treated and all questions answered.  Right heel skin fissure -Heel debrided of excess callus -Silvadene and band-aid applied to fissure. -Rx silvadene  Return if symptoms worsen or fail to improve.

## 2020-08-25 ENCOUNTER — Telehealth: Payer: Self-pay | Admitting: Cardiovascular Disease

## 2020-08-25 NOTE — Telephone Encounter (Signed)
Patient would like to speak to Dr. Kennon Holter Nurse regarding his latest Echo. The patient's Son is asking the patient questions and the patient wants to make sure he is telling his son the correct information

## 2020-08-25 NOTE — Telephone Encounter (Signed)
Reviewed with patient his echo results and last note. Explained CT was not done d/t renal function. He wanted to confirm size of TAA. Explained repeat echo will be done in 1 year to follow up on size. Recall entered for 6 month visit with MD

## 2020-08-31 DIAGNOSIS — E1122 Type 2 diabetes mellitus with diabetic chronic kidney disease: Secondary | ICD-10-CM | POA: Diagnosis not present

## 2020-08-31 DIAGNOSIS — K219 Gastro-esophageal reflux disease without esophagitis: Secondary | ICD-10-CM | POA: Diagnosis not present

## 2020-08-31 DIAGNOSIS — Z0000 Encounter for general adult medical examination without abnormal findings: Secondary | ICD-10-CM | POA: Diagnosis not present

## 2020-08-31 DIAGNOSIS — I2510 Atherosclerotic heart disease of native coronary artery without angina pectoris: Secondary | ICD-10-CM | POA: Diagnosis not present

## 2020-08-31 DIAGNOSIS — E039 Hypothyroidism, unspecified: Secondary | ICD-10-CM | POA: Diagnosis not present

## 2020-08-31 DIAGNOSIS — I10 Essential (primary) hypertension: Secondary | ICD-10-CM | POA: Diagnosis not present

## 2020-08-31 DIAGNOSIS — D473 Essential (hemorrhagic) thrombocythemia: Secondary | ICD-10-CM | POA: Diagnosis not present

## 2020-08-31 DIAGNOSIS — E785 Hyperlipidemia, unspecified: Secondary | ICD-10-CM | POA: Diagnosis not present

## 2020-10-04 ENCOUNTER — Telehealth: Payer: Self-pay

## 2020-10-04 DIAGNOSIS — K222 Esophageal obstruction: Secondary | ICD-10-CM | POA: Diagnosis not present

## 2020-10-04 DIAGNOSIS — R1310 Dysphagia, unspecified: Secondary | ICD-10-CM | POA: Diagnosis not present

## 2020-10-04 NOTE — Telephone Encounter (Signed)
   [facility] Pre-operative Risk Assessment    Patient Name: [name]  DOB: 09/19/1932 MRN: [id number]   Request for surgical clearance:  What type of surgery is being performed  EGD  When is this surgery scheduled 10/31/20  What type of clearance is required   Both  Are there any medications that need to be held prior to surgery and how long  Aspirin  Practice name and name of physician performing surgery [facility]  What is the office phone number [phone]   7.   What is the office fax number        [phone]  8.   Anesthesia type  Propofol   Kathyrn Lass 10/04/2020, 2:51 PM  _________________________________________________________________   (provider comments below)

## 2020-10-04 NOTE — Telephone Encounter (Signed)
   Name: [name]  DOB: 08-15-1932  MRN: [id number]   Primary Cardiologist: Quay Burow, MD  Chart reviewed as part of pre-operative protocol coverage. Patient was contacted 10/04/2020 in reference to pre-operative risk assessment for pending surgery as outlined below.  [name] was last seen on 08/16/20 by Dr. Gwenlyn Found.  Since that day, [name] has done fine from a cardiac standpoint. He continues to be limited by hip pain made worse by a fall last week. He is unable to complete 4 METs though denies chest pain or SOB with his limited activities.  Dr. Gwenlyn Found - this patient has a history of CAD s/p remote stenting to RCA and LAD, with patent stents on LHC in 2007 and no ischemia on stress test in 2019. He has an ascending aortic aneurysm of 5cm on echo 06/2020 but otherwise EF 60-65%, G1DD, and no significant valvular abnormalities. Can you comment on his preop risk given his limited activities? Please route your response back to P CV DIV PREOP.  Will plan to hold aspirin 7 days prior to his upcoming procedure.  I will route this recommendation to the requesting party via Epic fax function once final recommendations are received from Dr. Gwenlyn Found.   Abigail Butts, PA-C 10/04/2020, 4:25 PM

## 2020-10-04 NOTE — Telephone Encounter (Signed)
    Patient Name: [name]  DOB: Mar 31, 1932 MRN: [id number]  Primary Cardiologist: Quay Burow, MD  Chart reviewed as part of pre-operative protocol coverage.   Per Dr. Gwenlyn Found, patient is an acceptable risk for surgery can proceed with planned EGD without further cardiac work-up. He can hold aspirin 7 days prior to his upcoming procedure with plans to restart as soon as he is cleared to do so by his surgeon.  I will route this recommendation to the requesting party via Epic fax function and remove from pre-op pool.  Please call with questions.  Abigail Butts, PA-C 10/04/2020, 5:35 PM

## 2020-10-05 ENCOUNTER — Other Ambulatory Visit: Payer: Self-pay | Admitting: Internal Medicine

## 2020-10-05 DIAGNOSIS — D75839 Thrombocytosis, unspecified: Secondary | ICD-10-CM

## 2020-10-06 ENCOUNTER — Encounter: Payer: Self-pay | Admitting: Internal Medicine

## 2020-10-24 DIAGNOSIS — K222 Esophageal obstruction: Secondary | ICD-10-CM | POA: Diagnosis not present

## 2020-10-24 DIAGNOSIS — R1310 Dysphagia, unspecified: Secondary | ICD-10-CM | POA: Diagnosis not present

## 2020-10-24 DIAGNOSIS — K449 Diaphragmatic hernia without obstruction or gangrene: Secondary | ICD-10-CM | POA: Diagnosis not present

## 2020-12-07 ENCOUNTER — Other Ambulatory Visit: Payer: Self-pay | Admitting: Cardiovascular Disease

## 2020-12-21 ENCOUNTER — Other Ambulatory Visit: Payer: Self-pay

## 2020-12-21 ENCOUNTER — Inpatient Hospital Stay (HOSPITAL_BASED_OUTPATIENT_CLINIC_OR_DEPARTMENT_OTHER): Payer: Medicare HMO | Admitting: Internal Medicine

## 2020-12-21 ENCOUNTER — Inpatient Hospital Stay: Payer: Medicare HMO | Attending: Internal Medicine

## 2020-12-21 VITALS — BP 124/65 | HR 61 | Temp 95.9°F | Resp 20 | Ht 72.0 in | Wt 197.3 lb

## 2020-12-21 DIAGNOSIS — Z79899 Other long term (current) drug therapy: Secondary | ICD-10-CM | POA: Diagnosis not present

## 2020-12-21 DIAGNOSIS — E119 Type 2 diabetes mellitus without complications: Secondary | ICD-10-CM | POA: Diagnosis not present

## 2020-12-21 DIAGNOSIS — Z7984 Long term (current) use of oral hypoglycemic drugs: Secondary | ICD-10-CM | POA: Diagnosis not present

## 2020-12-21 DIAGNOSIS — D75839 Thrombocytosis, unspecified: Secondary | ICD-10-CM

## 2020-12-21 DIAGNOSIS — D72829 Elevated white blood cell count, unspecified: Secondary | ICD-10-CM | POA: Insufficient documentation

## 2020-12-21 DIAGNOSIS — D471 Chronic myeloproliferative disease: Secondary | ICD-10-CM | POA: Insufficient documentation

## 2020-12-21 DIAGNOSIS — I10 Essential (primary) hypertension: Secondary | ICD-10-CM | POA: Diagnosis not present

## 2020-12-21 DIAGNOSIS — D45 Polycythemia vera: Secondary | ICD-10-CM | POA: Insufficient documentation

## 2020-12-21 LAB — CMP (CANCER CENTER ONLY)
ALT: 9 U/L (ref 0–44)
AST: 12 U/L — ABNORMAL LOW (ref 15–41)
Albumin: 3.7 g/dL (ref 3.5–5.0)
Alkaline Phosphatase: 57 U/L (ref 38–126)
Anion gap: 8 (ref 5–15)
BUN: 15 mg/dL (ref 8–23)
CO2: 25 mmol/L (ref 22–32)
Calcium: 9.2 mg/dL (ref 8.9–10.3)
Chloride: 106 mmol/L (ref 98–111)
Creatinine: 1.4 mg/dL — ABNORMAL HIGH (ref 0.61–1.24)
GFR, Estimated: 49 mL/min — ABNORMAL LOW (ref >60)
Glucose, Bld: 106 mg/dL — ABNORMAL HIGH (ref 70–99)
Potassium: 4.8 mmol/L (ref 3.5–5.1)
Sodium: 139 mmol/L (ref 135–145)
Total Bilirubin: 0.7 mg/dL (ref 0.3–1.2)
Total Protein: 8 g/dL (ref 6.5–8.1)

## 2020-12-21 LAB — CBC WITH DIFFERENTIAL (CANCER CENTER ONLY)
Abs Immature Granulocytes: 0.06 10*3/uL (ref 0.00–0.07)
Basophils Absolute: 0.2 10*3/uL — ABNORMAL HIGH (ref 0.0–0.1)
Basophils Relative: 2 %
Eosinophils Absolute: 0.2 10*3/uL (ref 0.0–0.5)
Eosinophils Relative: 2 %
HCT: 46.3 % (ref 39.0–52.0)
Hemoglobin: 14.2 g/dL (ref 13.0–17.0)
Immature Granulocytes: 1 %
Lymphocytes Relative: 10 %
Lymphs Abs: 1.1 10*3/uL (ref 0.7–4.0)
MCH: 26.8 pg (ref 26.0–34.0)
MCHC: 30.7 g/dL (ref 30.0–36.0)
MCV: 87.5 fL (ref 80.0–100.0)
Monocytes Absolute: 0.9 10*3/uL (ref 0.1–1.0)
Monocytes Relative: 8 %
Neutro Abs: 8.6 10*3/uL — ABNORMAL HIGH (ref 1.7–7.7)
Neutrophils Relative %: 77 %
Platelet Count: 532 10*3/uL — ABNORMAL HIGH (ref 150–400)
RBC: 5.29 MIL/uL (ref 4.22–5.81)
RDW: 14.7 % (ref 11.5–15.5)
WBC Count: 11.1 10*3/uL — ABNORMAL HIGH (ref 4.0–10.5)
nRBC: 0 % (ref 0.0–0.2)

## 2020-12-21 LAB — LACTATE DEHYDROGENASE: LDH: 155 U/L (ref 98–192)

## 2020-12-21 NOTE — Progress Notes (Signed)
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  PRIOR THERAPY: Phlebotomy on as-needed basis.   CURRENT THERAPY: Hydroxyurea 500 mg by mouth daily.   INTERVAL HISTORY: [name] 87 y.o. male returns to the clinic today for follow-up visit.  The patient is feeling fine today with no concerning complaints.  He denied having any significant fatigue or weakness.  He continues to have arthralgia in his knees.  He is celebrating his 27 birthday next week.  He denied having any chest pain, shortness of breath, cough or hemoptysis.  He has no nausea, vomiting, diarrhea or constipation.  He has no headache or visual changes.  He continues to tolerate his treatment with hydroxyurea fairly well.  He is here for evaluation and repeat blood work.  MEDICAL HISTORY: Past Medical History:  Diagnosis Date   Chest pain    Coronary artery disease    Diabetes mellitus without complication ([place])    Hyperlipidemia    Hypertension     ALLERGIES:  has No Known Allergies.  MEDICATIONS:  Current Outpatient Medications  Medication Sig Dispense Refill   acetaminophen (TYLENOL) 325 MG tablet Take 650 mg by mouth every 6 (six) hours as needed for moderate pain. Reported on 01/23/2015     ASPIRIN 81 PO Take by mouth.     CINNAMON PO Take 1 capsule by mouth 2 (two) times daily.     clobetasol cream (TEMOVATE) 0.05 %      furosemide (LASIX) 40 MG tablet Take 1 tablet (40 mg total) by mouth daily. 90 tablet 3   gabapentin (NEURONTIN) 100 MG capsule Take 100 mg by mouth 2 (two) times daily.     glimepiride (AMARYL) 4 MG tablet Take 4 mg by mouth daily before breakfast.     hydrocortisone 2.5 % cream APPLY TOPICALLY TO AFFECTED AREA TO RASH  QD PRN  1   hydroxyurea (HYDREA) 500 MG  capsule TAKE 1 CAPSULE BY MOUTH ONCE DAILY WITH FOOD 90 capsule 0   ketoconazole (NIZORAL) 2 % shampoo Apply topically 2 (two) times a week.     losartan (COZAAR) 100 MG tablet Take 100 mg by mouth daily.     metoprolol tartrate (LOPRESSOR) 50 MG tablet Take 1 tablet by mouth twice daily 180 tablet 3   Multiple Vitamin (MULTIVITAMIN WITH MINERALS) TABS tablet Take 1 tablet by mouth every morning.     Omega-3 Fatty Acids (FISH OIL) 1200 MG CAPS Take 1 capsule by mouth 2 (two) times daily.     omeprazole (PRILOSEC) 20 MG capsule Take 40 mg by mouth daily.     omeprazole (PRILOSEC) 40 MG capsule Take 40 mg by mouth daily.     silver sulfADIAZINE (SILVADENE) 1 % cream Apply pea-sized amount to wound daily. 50 g 0   simvastatin (ZOCOR) 40 MG tablet TAKE 1 TABLET BY MOUTH ONCE DAILY IN THE EVENING 90 tablet 0   No current facility-administered medications for this visit.    SURGICAL HISTORY:  Past Surgical History:  Procedure Laterality Date   CARDIAC CATHETERIZATION  11/01/2005   patent stents with normal LV function   CARDIAC CATHETERIZATION  11/08/2003   cypher stent mid dominant right coronary lesion   CARDIAC SURGERY     CORONARY ANGIOPLASTY     post LAD and RCA  stenting   DOPPLER ECHOCARDIOGRAPHY  06/18/2010   EF =>55%,LV normal   EYE SURGERY     holter monitor  11/08/2005   NM MYOCAR PERF WALL MOTION  05/23/2008   EF 62% ,norm myocardial perfusion    REVIEW OF SYSTEMS:  A comprehensive review of systems was negative except for: Constitutional: positive for fatigue Musculoskeletal: positive for arthralgias   PHYSICAL EXAMINATION: General appearance: alert, cooperative and no distress Head: Normocephalic, without obvious abnormality, atraumatic Neck: no adenopathy, no JVD, supple, symmetrical, trachea midline and thyroid not enlarged, symmetric, no tenderness/mass/nodules Lymph nodes: Cervical, supraclavicular, and axillary nodes normal. Resp: clear to auscultation  bilaterally Back: symmetric, no curvature. ROM normal. No CVA tenderness. Cardio: regular rate and rhythm, S1, S2 normal, no murmur, click, rub or gallop GI: soft, non-tender; bowel sounds normal; no masses,  no organomegaly Extremities: extremities normal, atraumatic, no cyanosis or edema  ECOG PERFORMANCE STATUS: 1 - Symptomatic but completely ambulatory  Blood pressure 124/65, pulse 61, temperature (!) 95.9 F (35.5 C), temperature source Tympanic, resp. rate 20, height 6' (1.829 m), weight 197 lb 4.8 oz (89.5 kg), SpO2 100 %.  LABORATORY DATA: Lab Results  Component Value Date   WBC 10.4 06/15/2020   HGB 13.7 06/15/2020   HCT 44.1 06/15/2020   MCV 86.8 06/15/2020   PLT 470 (H) 06/15/2020      Chemistry      Component Value Date/Time   NA 138 06/15/2020 1003   NA 137 10/29/2016 1018   K 4.5 06/15/2020 1003   K 4.9 10/29/2016 1018   CL 105 06/15/2020 1003   CO2 24 06/15/2020 1003   CO2 25 10/29/2016 1018   BUN 16 06/15/2020 1003   BUN 13.5 10/29/2016 1018   CREATININE 2.80 (H) 07/28/2020 1315   CREATININE 1.44 (H) 06/15/2020 1003   CREATININE 1.2 10/29/2016 1018      Component Value Date/Time   CALCIUM 9.1 06/15/2020 1003   CALCIUM 9.1 10/29/2016 1018   ALKPHOS 50 06/15/2020 1003   ALKPHOS 62 10/29/2016 1018   AST 12 (L) 06/15/2020 1003   AST 18 10/29/2016 1018   ALT 9 06/15/2020 1003   ALT 12 10/29/2016 1018   BILITOT 0.7 06/15/2020 1003   BILITOT 0.46 10/29/2016 1018       RADIOGRAPHIC STUDIES: No results found.  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 The patient is currently on treatment with hydroxyurea 500 mg p.o. daily.  The patient has been tolerating this treatment fairly well with no concerning adverse effects. Repeat CBC today showed mild leukocytosis as well as thrombocytosis consistent with his diagnosis of the  myeloproliferative disorder. I recommended for him to continue his current treatment with hydroxyurea with the same dose for now. I will see him back for follow-up visit in 3 months for evaluation and repeat blood work. The patient was advised to call immediately if he has any other concerning symptoms in the interval.  The patient voices understanding of current disease status and treatment options and is in agreement with the current care plan. All questions were answered. The patient knows to call the clinic with any problems, questions or concerns. We can certainly see the patient much sooner if necessary.  Disclaimer: This note was dictated with voice recognition software. Similar sounding words can inadvertently be transcribed and may not be corrected upon review.

## 2021-01-04 ENCOUNTER — Other Ambulatory Visit: Payer: Self-pay | Admitting: Internal Medicine

## 2021-01-04 DIAGNOSIS — D75839 Thrombocytosis, unspecified: Secondary | ICD-10-CM

## 2021-01-09 ENCOUNTER — Other Ambulatory Visit: Payer: Self-pay | Admitting: Medical Oncology

## 2021-01-09 ENCOUNTER — Encounter: Payer: Self-pay | Admitting: Internal Medicine

## 2021-01-09 NOTE — Telephone Encounter (Signed)
Requests refill for Hydrea. Pt notified that refill done today.

## 2021-01-17 ENCOUNTER — Other Ambulatory Visit: Payer: Self-pay | Admitting: Podiatry

## 2021-01-17 NOTE — Telephone Encounter (Signed)
Please advise 

## 2021-01-19 ENCOUNTER — Encounter: Payer: Self-pay | Admitting: Podiatry

## 2021-01-19 ENCOUNTER — Ambulatory Visit (INDEPENDENT_AMBULATORY_CARE_PROVIDER_SITE_OTHER): Payer: Medicare HMO | Admitting: Podiatry

## 2021-01-19 ENCOUNTER — Other Ambulatory Visit: Payer: Self-pay

## 2021-01-19 DIAGNOSIS — B351 Tinea unguium: Secondary | ICD-10-CM

## 2021-01-19 DIAGNOSIS — M79674 Pain in right toe(s): Secondary | ICD-10-CM | POA: Diagnosis not present

## 2021-01-19 DIAGNOSIS — L97411 Non-pressure chronic ulcer of right heel and midfoot limited to breakdown of skin: Secondary | ICD-10-CM | POA: Diagnosis not present

## 2021-01-19 DIAGNOSIS — E118 Type 2 diabetes mellitus with unspecified complications: Secondary | ICD-10-CM | POA: Diagnosis not present

## 2021-01-19 DIAGNOSIS — M79675 Pain in left toe(s): Secondary | ICD-10-CM

## 2021-01-19 NOTE — Progress Notes (Signed)
°  Subjective:  Patient ID: [name], male    DOB: 1932/08/13,   MRN: [id number]  Chief Complaint  Patient presents with   Nail Problem    Routine foot care / patient is a diabetic -states there is a whole on his right foot near his heel that's been there for about 10 months - some discharge.     88 y.o. male presents for routine foot care complaining of thickened elongated and painful toenails. Requesting to have them trimmed. Also complaints of pain at his right heal and drainage. Relates he believes a wound has opened up and has been putting silvadene on the area.  . Denies any other pedal complaints. Denies n/v/f/c.   Past Medical History:  Diagnosis Date   Chest pain    Coronary artery disease    Diabetes mellitus without complication ([place])    Hyperlipidemia    Hypertension     Objective:  Physical Exam: Vascular: DP/PT pulses 2/4 bilateral. CFT <3 seconds. Normal hair growth on digits. No edema.  Skin. No lacerations or abrasions bilateral feet. Small superficial fissure noted to right heel with hyperkeratotic surrounding tissue. 0.2 cm superficial abrasion noted to heel.  Musculoskeletal: MMT 5/5 bilateral lower extremities in DF, PF, Inversion and Eversion. Deceased ROM in DF of ankle joint.  Neurological: Sensation intact to light touch.   Assessment:   1. Ulcer of heel, right, limited to breakdown of skin ([place])   2. Diabetes mellitus type 2 with complications [facility])      Plan:  Patient was evaluated and treated and all questions answered. Ulcer right heel limited to breakdown of skin -Debridement as below. -Dressed with silvadene, DSD. -Off-loading with padding.  -No abx indicated.  -Discussed glucose control and proper protein-rich diet.  -Discussed if any worsening redness, pain, fever or chills to call or may need to report to the emergency room. Patient expressed understanding.   Procedure: Excisional Debridement of Wound Rationale: Removal of  non-viable soft tissue from the wound to promote healing.  Anesthesia: none Pre-Debridement Wound Measurements: overlying hyperkeratosis  Post-Debridement Wound Measurements: 0.2 cm x 0.1 cm x 0.1 cm  Type of Debridement: Sharp Excisional Tissue Removed: Non-viable soft tissue Depth of Debridement: subcutaneous tissue. Technique: Sharp excisional debridement to bleeding, viable wound base.  Dressing: Dry, sterile, compression dressing. Disposition: Patient tolerated procedure well. Patient to return in 2 week for follow-up.  No follow-ups on file.   Lorenda Peck, DPM

## 2021-02-09 ENCOUNTER — Other Ambulatory Visit: Payer: Self-pay

## 2021-02-09 ENCOUNTER — Ambulatory Visit (INDEPENDENT_AMBULATORY_CARE_PROVIDER_SITE_OTHER): Payer: Medicare HMO | Admitting: Podiatry

## 2021-02-09 ENCOUNTER — Encounter: Payer: Self-pay | Admitting: Podiatry

## 2021-02-09 DIAGNOSIS — L84 Corns and callosities: Secondary | ICD-10-CM

## 2021-02-09 DIAGNOSIS — L97411 Non-pressure chronic ulcer of right heel and midfoot limited to breakdown of skin: Secondary | ICD-10-CM | POA: Diagnosis not present

## 2021-02-09 DIAGNOSIS — E118 Type 2 diabetes mellitus with unspecified complications: Secondary | ICD-10-CM | POA: Diagnosis not present

## 2021-02-09 NOTE — Progress Notes (Signed)
°  Subjective:  Patient ID: [name], male    DOB: 06/27/1932,   MRN: [id number]  Chief Complaint  Patient presents with   Foot Ulcer     2 weeks wound check, right heel ulcer    88 y.o. male presents follow-up of right heel wound. Has been dressing as instructed. Relates he is doing well but looks about the same. Also has an area on his left foot he wants looked at   . Denies any other pedal complaints. Denies n/v/f/c.   Past Medical History:  Diagnosis Date   Chest pain    Coronary artery disease    Diabetes mellitus without complication ([place])    Hyperlipidemia    Hypertension     Objective:  Physical Exam: Vascular: DP/PT pulses 2/4 bilateral. CFT <3 seconds. Normal hair growth on digits. No edema.  Skin. No lacerations or abrasions bilateral feet. Small superficial fissure noted to right heel with hyperkeratotic surrounding tissue. 0.2 cm superficial abrasion noted to heel. Left foot with plantar lateral midfoot pre-ulcerative hyperkeratotic lesion.  Musculoskeletal: MMT 5/5 bilateral lower extremities in DF, PF, Inversion and Eversion. Deceased ROM in DF of ankle joint.  Neurological: Sensation intact to light touch.   Assessment:   1. Ulcer of heel, right, limited to breakdown of skin ([place])   2. Diabetes mellitus type 2 with complications ([place])   3. Pre-ulcerative calluses       Plan:  Patient was evaluated and treated and all questions answered. Ulcer right heel limited to breakdown of skin -Debridement as below. -Hyperkeratotic tissue debrided without incident.  -Dressed with betadine, DSD. -Off-loading with padding and surgical shoe  -No abx indicated.  -Discussed glucose control and proper protein-rich diet.  -Discussed if any worsening redness, pain, fever or chills to call or may need to report to the emergency room. Patient expressed understanding.   Procedure: Excisional Debridement of Wound Rationale: Removal of non-viable soft tissue from the  wound to promote healing.  Anesthesia: none Pre-Debridement Wound Measurements: overlying hyperkeratosis  Post-Debridement Wound Measurements: 0.2 cm x 0.1 cm x 0.1 cm  Type of Debridement: Sharp Excisional Tissue Removed: Non-viable soft tissue Depth of Debridement: subcutaneous tissue. Technique: Sharp excisional debridement to bleeding, viable wound base.  Dressing: Dry, sterile, compression dressing. Disposition: Patient tolerated procedure well. Patient to return in 2 week for follow-up.  Return in about 2 weeks (around 02/23/2021) for wound check.   Lorenda Peck, DPM

## 2021-02-21 ENCOUNTER — Encounter: Payer: Self-pay | Admitting: Cardiovascular Disease

## 2021-02-21 ENCOUNTER — Other Ambulatory Visit: Payer: Self-pay

## 2021-02-21 ENCOUNTER — Ambulatory Visit (INDEPENDENT_AMBULATORY_CARE_PROVIDER_SITE_OTHER): Payer: Medicare HMO | Admitting: Cardiovascular Disease

## 2021-02-21 DIAGNOSIS — I10 Essential (primary) hypertension: Secondary | ICD-10-CM | POA: Diagnosis not present

## 2021-02-21 DIAGNOSIS — I2510 Atherosclerotic heart disease of native coronary artery without angina pectoris: Secondary | ICD-10-CM

## 2021-02-21 DIAGNOSIS — I7121 Aneurysm of the ascending aorta, without rupture: Secondary | ICD-10-CM | POA: Diagnosis not present

## 2021-02-21 NOTE — Assessment & Plan Note (Signed)
History of hyperlipidemia on statin therapy with lipid profile performed 11/05/2016 revealing total cholesterol 114, LDL of 56 and HDL of 53 followed by his PCP.

## 2021-02-21 NOTE — Progress Notes (Signed)
02/21/2021 Ziaire A Szczesniak   [birth year]  [id number]  Primary Physician [name], DO Primary Cardiologist: Lorretta Harp MD Garret Reddish, [place], [place]  HPI:  [name] is a 88 y.o.  moderately overweight widowed, but remarried, Caucasian male (wife died September 13, 2010 at age 35), father of 60, grandfather to 6 grandchildren who I last saw in the office 08/16/2020. He has a history of CAD status post LAD and RCA stenting in the past. He was last catheterized by Dr. Melvern Banker in 2007 and found to have patent stents with normal LV function. His other problems include hypertension, hyperlipidemia and diabetes. He does not smoke or drink.Since I saw him 11/26/13 he has been relatively asymptomatic. He had a Myoview stress test and 2-D echo performed in October 2014 year which were normal. He complains of erectile dysfunction.   Since I saw him 6 months ago he is done well .  He does walk with a walker for balance.  He says that his knees are weak.  He also has a slowly healing wound on his right foot that is being treated by Dr. Blenda Mounts at [facility].  He has a 50 mm ascending thoracic aortic aneurysms recently seen on 2D echocardiogram performed 06/20/2020 which we will continue to follow.  He denies chest pain or shortness of breath.   Current Meds  Medication Sig   acetaminophen (TYLENOL) 325 MG tablet Take 650 mg by mouth every 6 (six) hours as needed for moderate pain. Reported on 01/23/2015   ASPIRIN 81 PO Take by mouth.   CINNAMON PO Take 1 capsule by mouth 2 (two) times daily.   clobetasol cream (TEMOVATE) 0.05 %    furosemide (LASIX) 40 MG tablet Take 1 tablet (40 mg total) by mouth daily.   gabapentin (NEURONTIN) 100 MG capsule Take 100 mg by mouth 2 (two) times daily.   glimepiride (AMARYL) 4 MG tablet Take 4 mg by mouth daily before breakfast.   hydrocortisone 2.5 % cream APPLY TOPICALLY TO AFFECTED AREA TO RASH  QD PRN   hydroxyurea (HYDREA) 500 MG capsule TAKE 1 CAPSULE BY  MOUTH ONCE DAILY WITH FOOD   ketoconazole (NIZORAL) 2 % shampoo Apply topically 2 (two) times a week.   losartan (COZAAR) 100 MG tablet Take 100 mg by mouth daily.   metoprolol tartrate (LOPRESSOR) 50 MG tablet Take 1 tablet by mouth twice daily   Multiple Vitamin (MULTIVITAMIN WITH MINERALS) TABS tablet Take 1 tablet by mouth every morning.   Omega-3 Fatty Acids (FISH OIL) 1200 MG CAPS Take 1 capsule by mouth 2 (two) times daily.   omeprazole (PRILOSEC) 40 MG capsule Take 40 mg by mouth daily.   silver sulfADIAZINE (SSD) 1 % cream APPLY PEA-SIZED AMOUNT TO WOUND ONCE DAILY   simvastatin (ZOCOR) 40 MG tablet TAKE 1 TABLET BY MOUTH ONCE DAILY IN THE EVENING     No Known Allergies  Social History   Socioeconomic History   Marital status: Significant Other    Spouse name: Not on file   Number of children: 4   Years of education: 55   Highest education level: Not on file  Occupational History   Not on file  Tobacco Use   Smoking status: Former    Types: Cigarettes   Smokeless tobacco: Former    Quit date: 10/31/1982  Substance and Sexual Activity   Alcohol use: Yes    Comment: 1 glass wine daily   Drug use: No  Sexual activity: Never  Other Topics Concern   Not on file  Social History Narrative   Not on file   Social Determinants of Health   Financial Resource Strain: Not on file  Food Insecurity: Not on file  Transportation Needs: Not on file  Physical Activity: Not on file  Stress: Not on file  Social Connections: Not on file  Intimate Partner Violence: Not on file     Review of Systems: General: negative for chills, fever, night sweats or weight changes.  Cardiovascular: negative for chest pain, dyspnea on exertion, edema, orthopnea, palpitations, paroxysmal nocturnal dyspnea or shortness of breath Dermatological: negative for rash Respiratory: negative for cough or wheezing Urologic: negative for hematuria Abdominal: negative for nausea, vomiting, diarrhea,  bright red blood per rectum, melena, or hematemesis Neurologic: negative for visual changes, syncope, or dizziness All other systems reviewed and are otherwise negative except as noted above.    Blood pressure 107/63, pulse 80, height 6' (1.829 m), weight 198 lb 6.4 oz (90 kg), SpO2 97 %.  General appearance: alert and no distress Neck: no adenopathy, no carotid bruit, no JVD, supple, symmetrical, trachea midline, and thyroid not enlarged, symmetric, no tenderness/mass/nodules Lungs: clear to auscultation bilaterally Heart: regular rate and rhythm, S1, S2 normal, no murmur, click, rub or gallop Extremities: extremities normal, atraumatic, no cyanosis or edema Pulses: 2+ and symmetric Skin: Skin color, texture, turgor normal. No rashes or lesions Neurologic: Grossly normal  EKG sinus rhythm at 80 with inferior Q waves and septal Q waves.  I personally reviewed this EKG.  ASSESSMENT AND PLAN:   HYPERLIPIDEMIA History of hyperlipidemia on statin therapy with lipid profile performed 11/05/2016 revealing total cholesterol 114, LDL of 56 and HDL of 53 followed by his PCP.  Essential hypertension History of essential hypertension a blood pressure measured today at 107/63.  He is on metoprolol.  Coronary atherosclerosis History of CAD status post LAD and RCA stenting in the past.  He was catheterized by Dr. Melvern Banker in 2007 found to have patent stents with normal LV function.  He denies chest pain or shortness of breath.  Thoracic aortic aneurysm (HCC) History of moderate size ascending thoracic aortic aneurysm measuring 50 mm by 2D echo performed 06/20/2020.  This will be repeated this June.  I suspect that it will never get to a size requiring revascularization during his lifetime.     Lorretta Harp MD FACP,FACC,FAHA, [facility] 02/21/2021 11:08 AM

## 2021-02-21 NOTE — Patient Instructions (Signed)
Medication Instructions:  Your physician recommends that you continue on your current medications as directed. Please refer to the Current Medication list given to you today.  *If you need a refill on your cardiac medications before your next appointment, please call your pharmacy*   Testing/Procedures: Your physician has requested that you have an echocardiogram. Echocardiography is a painless test that uses sound waves to create images of your heart. It provides your doctor with information about the size and shape of your heart and how well your hearts chambers and valves are working. This procedure takes approximately one hour. There are no restrictions for this procedure. To be done in June. This procedure is done at [address]   Follow-Up: At [facility], you and your health needs are our priority.  As part of our continuing mission to provide you with exceptional heart care, we have created designated Provider Care Teams.  These Care Teams include your primary Cardiologist (physician) and Advanced Practice Providers (APPs -  Physician Assistants and Nurse Practitioners) who all work together to provide you with the care you need, when you need it.  We recommend signing up for the patient portal called "MyChart".  Sign up information is provided on this After Visit Summary.  MyChart is used to connect with patients for Virtual Visits (Telemedicine).  Patients are able to view lab/test results, encounter notes, upcoming appointments, etc.  Non-urgent messages can be sent to your provider as well.   To learn more about what you can do with MyChart, go to [email or web address].    Your next appointment:   12 month(s)  The format for your next appointment:   In Person  Provider:   Quay Burow, MD

## 2021-02-21 NOTE — Assessment & Plan Note (Signed)
History of essential hypertension a blood pressure measured today at 107/63.  He is on metoprolol.

## 2021-02-21 NOTE — Assessment & Plan Note (Signed)
History of CAD status post LAD and RCA stenting in the past.  He was catheterized by Dr. Melvern Banker in 2007 found to have patent stents with normal LV function.  He denies chest pain or shortness of breath.

## 2021-02-21 NOTE — Assessment & Plan Note (Signed)
History of moderate size ascending thoracic aortic aneurysm measuring 50 mm by 2D echo performed 06/20/2020.  This will be repeated this June.  I suspect that it will never get to a size requiring revascularization during his lifetime.

## 2021-02-22 ENCOUNTER — Ambulatory Visit (INDEPENDENT_AMBULATORY_CARE_PROVIDER_SITE_OTHER): Payer: Medicare HMO | Admitting: Podiatry

## 2021-02-22 ENCOUNTER — Encounter: Payer: Self-pay | Admitting: Podiatry

## 2021-02-22 DIAGNOSIS — E118 Type 2 diabetes mellitus with unspecified complications: Secondary | ICD-10-CM

## 2021-02-22 DIAGNOSIS — L97411 Non-pressure chronic ulcer of right heel and midfoot limited to breakdown of skin: Secondary | ICD-10-CM

## 2021-02-22 DIAGNOSIS — L84 Corns and callosities: Secondary | ICD-10-CM | POA: Diagnosis not present

## 2021-02-22 NOTE — Progress Notes (Signed)
°  Subjective:  Patient ID: [name], male    DOB: [birth year],   MRN: [id number]  Chief Complaint  Patient presents with   Wound Check    Right foot wound check    88 y.o. male presents follow-up of right heel wound. Has been dressing as instructed. Relates he is doing well and believes it is healed.  Denies any other pedal complaints. Denies n/v/f/c.   Past Medical History:  Diagnosis Date   Chest pain    Coronary artery disease    Diabetes mellitus without complication ([place])    Hyperlipidemia    Hypertension     Objective:  Physical Exam: Vascular: DP/PT pulses 2/4 bilateral. CFT <3 seconds. Normal hair growth on digits. No edema.  Skin. No lacerations or abrasions bilateral feet. Small superficial fissure noted to right heel with hyperkeratotic surrounding tissue. Previous wound healed.  Left foot with plantar lateral midfoot pre-ulcerative hyperkeratotic lesion.  Musculoskeletal: MMT 5/5 bilateral lower extremities in DF, PF, Inversion and Eversion. Deceased ROM in DF of ankle joint.  Neurological: Sensation intact to light touch.   Assessment:   1. Pre-ulcerative calluses   2. Ulcer of heel, right, limited to breakdown of skin ([place])   3. Diabetes mellitus type 2 with complications [facility])        Plan:  Patient was evaluated and treated and all questions answered. Ulcer right heel limited to breakdown of skin- Healed  -Hyperkeratotic tissue debrided without incident.  -Off-loading with padding and regular shoe -No abx indicated.  -Discussed glucose control and proper protein-rich diet.  -Discussed if any worsening redness, pain, fever or chills to call or may need to report to the emergency room. Patient expressed understanding.  Will follow-up in 4 weeks to make sure everything is still healed at that time.     No follow-ups on file.   Lorenda Peck, DPM

## 2021-02-27 DIAGNOSIS — I10 Essential (primary) hypertension: Secondary | ICD-10-CM | POA: Diagnosis not present

## 2021-02-27 DIAGNOSIS — D473 Essential (hemorrhagic) thrombocythemia: Secondary | ICD-10-CM | POA: Diagnosis not present

## 2021-02-27 DIAGNOSIS — E1122 Type 2 diabetes mellitus with diabetic chronic kidney disease: Secondary | ICD-10-CM | POA: Diagnosis not present

## 2021-02-27 DIAGNOSIS — E039 Hypothyroidism, unspecified: Secondary | ICD-10-CM | POA: Diagnosis not present

## 2021-02-27 DIAGNOSIS — E785 Hyperlipidemia, unspecified: Secondary | ICD-10-CM | POA: Diagnosis not present

## 2021-03-01 ENCOUNTER — Other Ambulatory Visit: Payer: Self-pay | Admitting: Cardiovascular Disease

## 2021-03-06 DIAGNOSIS — K219 Gastro-esophageal reflux disease without esophagitis: Secondary | ICD-10-CM | POA: Diagnosis not present

## 2021-03-06 DIAGNOSIS — E039 Hypothyroidism, unspecified: Secondary | ICD-10-CM | POA: Diagnosis not present

## 2021-03-06 DIAGNOSIS — D473 Essential (hemorrhagic) thrombocythemia: Secondary | ICD-10-CM | POA: Diagnosis not present

## 2021-03-06 DIAGNOSIS — I712 Thoracic aortic aneurysm, without rupture, unspecified: Secondary | ICD-10-CM | POA: Diagnosis not present

## 2021-03-06 DIAGNOSIS — R69 Illness, unspecified: Secondary | ICD-10-CM | POA: Diagnosis not present

## 2021-03-06 DIAGNOSIS — E1159 Type 2 diabetes mellitus with other circulatory complications: Secondary | ICD-10-CM | POA: Diagnosis not present

## 2021-03-06 DIAGNOSIS — E11621 Type 2 diabetes mellitus with foot ulcer: Secondary | ICD-10-CM | POA: Diagnosis not present

## 2021-03-06 DIAGNOSIS — E1122 Type 2 diabetes mellitus with diabetic chronic kidney disease: Secondary | ICD-10-CM | POA: Diagnosis not present

## 2021-03-21 ENCOUNTER — Inpatient Hospital Stay (HOSPITAL_BASED_OUTPATIENT_CLINIC_OR_DEPARTMENT_OTHER): Payer: Medicare HMO | Admitting: Internal Medicine

## 2021-03-21 ENCOUNTER — Other Ambulatory Visit: Payer: Self-pay

## 2021-03-21 ENCOUNTER — Inpatient Hospital Stay: Payer: Medicare HMO | Attending: Internal Medicine

## 2021-03-21 VITALS — BP 110/55 | HR 60 | Temp 97.4°F | Resp 18

## 2021-03-21 DIAGNOSIS — Z7984 Long term (current) use of oral hypoglycemic drugs: Secondary | ICD-10-CM | POA: Diagnosis not present

## 2021-03-21 DIAGNOSIS — I2510 Atherosclerotic heart disease of native coronary artery without angina pectoris: Secondary | ICD-10-CM | POA: Insufficient documentation

## 2021-03-21 DIAGNOSIS — D75839 Thrombocytosis, unspecified: Secondary | ICD-10-CM | POA: Diagnosis not present

## 2021-03-21 DIAGNOSIS — I10 Essential (primary) hypertension: Secondary | ICD-10-CM | POA: Diagnosis not present

## 2021-03-21 DIAGNOSIS — E119 Type 2 diabetes mellitus without complications: Secondary | ICD-10-CM | POA: Diagnosis not present

## 2021-03-21 DIAGNOSIS — D471 Chronic myeloproliferative disease: Secondary | ICD-10-CM | POA: Insufficient documentation

## 2021-03-21 DIAGNOSIS — E785 Hyperlipidemia, unspecified: Secondary | ICD-10-CM | POA: Insufficient documentation

## 2021-03-21 DIAGNOSIS — Z79899 Other long term (current) drug therapy: Secondary | ICD-10-CM | POA: Diagnosis not present

## 2021-03-21 LAB — CBC WITH DIFFERENTIAL (CANCER CENTER ONLY)
Abs Immature Granulocytes: 0.04 10*3/uL (ref 0.00–0.07)
Basophils Absolute: 0.3 10*3/uL — ABNORMAL HIGH (ref 0.0–0.1)
Basophils Relative: 2 %
Eosinophils Absolute: 0.1 10*3/uL (ref 0.0–0.5)
Eosinophils Relative: 1 %
HCT: 44.5 % (ref 39.0–52.0)
Hemoglobin: 13.7 g/dL (ref 13.0–17.0)
Immature Granulocytes: 0 %
Lymphocytes Relative: 10 %
Lymphs Abs: 1.1 10*3/uL (ref 0.7–4.0)
MCH: 26.9 pg (ref 26.0–34.0)
MCHC: 30.8 g/dL (ref 30.0–36.0)
MCV: 87.3 fL (ref 80.0–100.0)
Monocytes Absolute: 0.7 10*3/uL (ref 0.1–1.0)
Monocytes Relative: 6 %
Neutro Abs: 8.1 10*3/uL — ABNORMAL HIGH (ref 1.7–7.7)
Neutrophils Relative %: 81 %
Platelet Count: 489 10*3/uL — ABNORMAL HIGH (ref 150–400)
RBC: 5.1 MIL/uL (ref 4.22–5.81)
RDW: 14.7 % (ref 11.5–15.5)
WBC Count: 10.3 10*3/uL (ref 4.0–10.5)
nRBC: 0 % (ref 0.0–0.2)

## 2021-03-21 LAB — CMP (CANCER CENTER ONLY)
ALT: 11 U/L (ref 0–44)
AST: 13 U/L — ABNORMAL LOW (ref 15–41)
Albumin: 3.9 g/dL (ref 3.5–5.0)
Alkaline Phosphatase: 46 U/L (ref 38–126)
Anion gap: 3 — ABNORMAL LOW (ref 5–15)
BUN: 22 mg/dL (ref 8–23)
CO2: 29 mmol/L (ref 22–32)
Calcium: 9.6 mg/dL (ref 8.9–10.3)
Chloride: 103 mmol/L (ref 98–111)
Creatinine: 1.53 mg/dL — ABNORMAL HIGH (ref 0.61–1.24)
GFR, Estimated: 44 mL/min — ABNORMAL LOW (ref >60)
Glucose, Bld: 86 mg/dL (ref 70–99)
Potassium: 5 mmol/L (ref 3.5–5.1)
Sodium: 135 mmol/L (ref 135–145)
Total Bilirubin: 0.7 mg/dL (ref 0.3–1.2)
Total Protein: 7.7 g/dL (ref 6.5–8.1)

## 2021-03-21 LAB — LACTATE DEHYDROGENASE: LDH: 139 U/L (ref 98–192)

## 2021-03-21 NOTE — Progress Notes (Signed)
?    [place] ?[phone]   [phone] ? ?OFFICE PROGRESS NOTE ? ?[name], DO ?[address] ?[place] [address] ? 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 ? ?PRIOR THERAPY: Phlebotomy on as-needed basis.  ? ?CURRENT THERAPY: Hydroxyurea 500 mg by mouth daily.  ? ?INTERVAL HISTORY: ?[name] 88 y.o. male returns to the clinic today for follow-up visit.  The patient has no complaints today except for the baseline fatigue and essential tremors.  He denied having any current chest pain, shortness of breath, cough or hemoptysis.  He has no nausea, vomiting, diarrhea or constipation.  He has no headache or visual changes.  He has no bleeding, bruises or ecchymosis.  He continues to tolerate his treatment with hydroxyurea fairly well.  He is here today for evaluation and repeat blood work. ? ?MEDICAL HISTORY: ?Past Medical History:  ?Diagnosis Date  ? Chest pain   ? Coronary artery disease   ? Diabetes mellitus without complication ([name])   ? Hyperlipidemia   ? Hypertension   ? ? ?ALLERGIES:  has No Known Allergies. ? ?MEDICATIONS:  ?Current Outpatient Medications  ?Medication Sig Dispense Refill  ? acetaminophen (TYLENOL) 325 MG tablet Take 650 mg by mouth every 6 (six) hours as needed for moderate pain. Reported on 01/23/2015    ? ASPIRIN 81 PO Take by mouth.    ? CINNAMON PO Take 1 capsule by mouth 2 (two) times daily.    ? clobetasol cream (TEMOVATE) 0.05 %     ? furosemide (LASIX) 40 MG tablet Take 1 tablet (40 mg total) by mouth daily. 90 tablet 3  ? gabapentin (NEURONTIN) 100 MG capsule Take 100 mg by mouth 2 (two) times daily.    ? glimepiride (AMARYL) 4 MG tablet Take 4 mg by mouth daily before breakfast.    ? hydrocortisone 2.5 % cream APPLY TOPICALLY TO AFFECTED AREA TO RASH  QD PRN  1  ? hydroxyurea (HYDREA) 500 MG capsule TAKE 1 CAPSULE BY MOUTH ONCE DAILY WITH FOOD 90 capsule 0   ? ketoconazole (NIZORAL) 2 % shampoo Apply topically 2 (two) times a week.    ? losartan (COZAAR) 100 MG tablet Take 100 mg by mouth daily.    ? metoprolol tartrate (LOPRESSOR) 50 MG tablet Take 1 tablet by mouth twice daily 180 tablet 3  ? Multiple Vitamin (MULTIVITAMIN WITH MINERALS) TABS tablet Take 1 tablet by mouth every morning.    ? Omega-3 Fatty Acids (FISH OIL) 1200 MG CAPS Take 1 capsule by mouth 2 (two) times daily.    ? omeprazole (PRILOSEC) 40 MG capsule Take 40 mg by mouth daily.    ? silver sulfADIAZINE (SSD) 1 % cream APPLY PEA-SIZED AMOUNT TO WOUND ONCE DAILY 50 g 0  ? simvastatin (ZOCOR) 40 MG tablet TAKE 1 TABLET BY MOUTH ONCE DAILY IN THE EVENING 90 tablet 3  ? ?No current facility-administered medications for this visit.  ? ? ?SURGICAL HISTORY:  ?Past Surgical History:  ?Procedure Laterality Date  ? CARDIAC CATHETERIZATION  11/01/2005  ? patent stents with normal LV function  ? CARDIAC CATHETERIZATION  11/08/2003  ? cypher stent mid dominant right coronary lesion  ? CARDIAC SURGERY    ? CORONARY ANGIOPLASTY    ? post LAD and RCA stenting  ? DOPPLER ECHOCARDIOGRAPHY  06/18/2010  ? EF =>55%,LV normal  ? EYE SURGERY    ? holter monitor  11/08/2005  ? NM  MYOCAR PERF WALL MOTION  05/23/2008  ? EF 62% ,norm myocardial perfusion  ? ? ?REVIEW OF SYSTEMS:  A comprehensive review of systems was negative except for: Constitutional: positive for fatigue ?Musculoskeletal: positive for arthralgias and muscle weakness ?Neurological: positive for tremors  ? ?PHYSICAL EXAMINATION: General appearance: alert, cooperative and no distress ?Head: Normocephalic, without obvious abnormality, atraumatic ?Neck: no adenopathy, no JVD, supple, symmetrical, trachea midline and thyroid not enlarged, symmetric, no tenderness/mass/nodules ?Lymph nodes: Cervical, supraclavicular, and axillary nodes normal. ?Resp: clear to auscultation bilaterally ?Back: symmetric, no curvature. ROM normal. No CVA tenderness. ?Cardio: regular  rate and rhythm, S1, S2 normal, no murmur, click, rub or gallop ?GI: soft, non-tender; bowel sounds normal; no masses,  no organomegaly ?Extremities: extremities normal, atraumatic, no cyanosis or edema ? ?ECOG PERFORMANCE STATUS: 1 - Symptomatic but completely ambulatory ? ?Blood pressure (!) 110/55, pulse 60, temperature (!) 97.4 ?F (36.3 ?C), temperature source Tympanic, resp. rate 18, SpO2 98 %. ? ?LABORATORY DATA: ?Lab Results  ?Component Value Date  ? WBC 10.3 03/21/2021  ? HGB 13.7 03/21/2021  ? HCT 44.5 03/21/2021  ? MCV 87.3 03/21/2021  ? PLT 489 (H) 03/21/2021  ? ? ?  Chemistry   ?   ?Component Value Date/Time  ? NA 135 03/21/2021 1032  ? NA 137 10/29/2016 1018  ? K 5.0 03/21/2021 1032  ? K 4.9 10/29/2016 1018  ? CL 103 03/21/2021 1032  ? CO2 29 03/21/2021 1032  ? CO2 25 10/29/2016 1018  ? BUN 22 03/21/2021 1032  ? BUN 13.5 10/29/2016 1018  ? CREATININE 1.53 (H) 03/21/2021 1032  ? CREATININE 1.2 10/29/2016 1018  ?    ?Component Value Date/Time  ? CALCIUM 9.6 03/21/2021 1032  ? CALCIUM 9.1 10/29/2016 1018  ? ALKPHOS 46 03/21/2021 1032  ? ALKPHOS 62 10/29/2016 1018  ? AST 13 (L) 03/21/2021 1032  ? AST 18 10/29/2016 1018  ? ALT 11 03/21/2021 1032  ? ALT 12 10/29/2016 1018  ? BILITOT 0.7 03/21/2021 1032  ? BILITOT 0.46 10/29/2016 1018  ?  ? ? ? ?RADIOGRAPHIC STUDIES: ?No results found. ? ?ASSESSMENT AND PLAN:  ?This is a very pleasant 88 years old white male with history of myeloproliferative disorder, polycythemia vera with positive JAK 2 mutation. ?The patient is currently on treatment with hydroxyurea 500 mg p.o. daily.  ?The patient has been tolerating this treatment with hydroxyurea fairly well with no concerning adverse effects. ?Repeat CBC today showed normal total white blood count as well as hemoglobin and hematocrit and slightly elevated platelets count of 489. ?I recommended for the patient to continue his current treatment with hydroxyurea with the same dose. ?I will see him back for follow-up  visit in 6 months for evaluation and repeat blood work. ?He was advised to call immediately if he has any other concerning issues in the interval. ?The patient was advised to call immediately if he has any other concerning symptoms in the interval. ? ?The patient voices understanding of current disease status and treatment options and is in agreement with the current care plan. ?All questions were answered. The patient knows to call the clinic with any problems, questions or concerns. We can certainly see the patient much sooner if necessary. ? ?Disclaimer: This note was dictated with voice recognition software. Similar sounding words can inadvertently be transcribed and may not be corrected upon review. ? ? ?  ?  ?

## 2021-03-22 ENCOUNTER — Ambulatory Visit (INDEPENDENT_AMBULATORY_CARE_PROVIDER_SITE_OTHER): Payer: Medicare HMO | Admitting: Podiatry

## 2021-03-22 ENCOUNTER — Encounter: Payer: Self-pay | Admitting: Podiatry

## 2021-03-22 DIAGNOSIS — E118 Type 2 diabetes mellitus with unspecified complications: Secondary | ICD-10-CM | POA: Diagnosis not present

## 2021-03-22 DIAGNOSIS — L97411 Non-pressure chronic ulcer of right heel and midfoot limited to breakdown of skin: Secondary | ICD-10-CM | POA: Diagnosis not present

## 2021-03-22 NOTE — Progress Notes (Signed)
?  Subjective:  ?Patient ID: [name], male    DOB: Dec 22, 1932,   MRN: [id number] ? ?Chief Complaint  ?Patient presents with  ? Foot Ulcer  ?   ?4 week ulcer.Right foot patient states right foot is getting somewhat better , painful sometimes , nothing constant , some swelling no redness ... patient denies any F/C/V/H  ? ? ?88 y.o. male presents follow-up of right heel wound. Relates he thinks it has opened up as it has been more sore.   Denies any other pedal complaints. Denies n/v/f/c.  ? ?Past Medical History:  ?Diagnosis Date  ? Chest pain   ? Coronary artery disease   ? Diabetes mellitus without complication ([place])   ? Hyperlipidemia   ? Hypertension   ? ? ?Objective:  ?Physical Exam: ?Vascular: DP/PT pulses 2/4 bilateral. CFT <3 seconds. Normal hair growth on digits. No edema.  ?Skin. No lacerations or abrasions bilateral feet. Small superficial fissure noted to right heel with hyperkeratotic surrounding tissue. Ulcer measuring 05 cm x 0.3 cm x 0.1 cm plantar left heel. Granular base. No erythema edema or purulence noted.  ?  Left foot with plantar lateral midfoot pre-ulcerative hyperkeratotic lesion.  ?Musculoskeletal: MMT 5/5 bilateral lower extremities in DF, PF, Inversion and Eversion. Deceased ROM in DF of ankle joint.  ?Neurological: Sensation intact to light touch.  ? ?Assessment:  ? ?1. Ulcer of heel, right, limited to breakdown of skin ([place])   ?2. Diabetes mellitus type 2 with complications ([place])   ? ? ? ? ? ? ?Plan:  ?Patient was evaluated and treated and all questions answered. ?Ulcer right heel limited to breakdown of skin ?-Debridement as below. ?-Dressed with betadine, DSD. ?-Off-loading with horsehoe padding. Not steady and do not thing he would do well in surgical shoe.  ?-No abx indicated.  ?-Discussed glucose control and proper protein-rich diet.  ?-Discussed if any worsening redness, pain, fever or chills to call or may need to report to the emergency room. Patient expressed  understanding.  ? ?Procedure: Excisional Debridement of Wound ?Rationale: Removal of non-viable soft tissue from the wound to promote healing.  ?Anesthesia: none ?Pre-Debridement Wound Measurements: Overlying hyperkeratosis  ?Post-Debridement Wound Measurements: 0.5 cm x 0.3 cm x 0.1 cm  ?Type of Debridement: Sharp Excisional ?Tissue Removed: Non-viable soft tissue ?Depth of Debridement: subcutaneous tissue. ?Technique: Sharp excisional debridement to bleeding, viable wound base.  ?Dressing: Dry, sterile, compression dressing. ?Disposition: Patient tolerated procedure well. Patient to return in 2 week for follow-up. ? ?Return in about 2 weeks (around 04/05/2021) for wound check. ? ? ? ? ?Return in about 2 weeks (around 04/05/2021) for wound check. ? ? ?Lorenda Peck, DPM  ? ? ?

## 2021-03-29 ENCOUNTER — Other Ambulatory Visit: Payer: Self-pay | Admitting: Internal Medicine

## 2021-03-29 DIAGNOSIS — D75839 Thrombocytosis, unspecified: Secondary | ICD-10-CM

## 2021-04-05 ENCOUNTER — Ambulatory Visit: Payer: Medicare HMO | Admitting: Podiatry

## 2021-04-12 DIAGNOSIS — E039 Hypothyroidism, unspecified: Secondary | ICD-10-CM | POA: Diagnosis not present

## 2021-04-12 DIAGNOSIS — W19XXXA Unspecified fall, initial encounter: Secondary | ICD-10-CM | POA: Diagnosis not present

## 2021-04-12 DIAGNOSIS — R1310 Dysphagia, unspecified: Secondary | ICD-10-CM | POA: Diagnosis not present

## 2021-04-12 DIAGNOSIS — I2510 Atherosclerotic heart disease of native coronary artery without angina pectoris: Secondary | ICD-10-CM | POA: Diagnosis not present

## 2021-04-12 DIAGNOSIS — E1122 Type 2 diabetes mellitus with diabetic chronic kidney disease: Secondary | ICD-10-CM | POA: Diagnosis not present

## 2021-04-12 DIAGNOSIS — E538 Deficiency of other specified B group vitamins: Secondary | ICD-10-CM | POA: Diagnosis not present

## 2021-04-12 DIAGNOSIS — K219 Gastro-esophageal reflux disease without esophagitis: Secondary | ICD-10-CM | POA: Diagnosis not present

## 2021-04-12 DIAGNOSIS — E1159 Type 2 diabetes mellitus with other circulatory complications: Secondary | ICD-10-CM | POA: Diagnosis not present

## 2021-04-12 DIAGNOSIS — R69 Illness, unspecified: Secondary | ICD-10-CM | POA: Diagnosis not present

## 2021-04-12 DIAGNOSIS — D473 Essential (hemorrhagic) thrombocythemia: Secondary | ICD-10-CM | POA: Diagnosis not present

## 2021-04-12 DIAGNOSIS — M6281 Muscle weakness (generalized): Secondary | ICD-10-CM | POA: Diagnosis not present

## 2021-04-12 DIAGNOSIS — R269 Unspecified abnormalities of gait and mobility: Secondary | ICD-10-CM | POA: Diagnosis not present

## 2021-04-13 DIAGNOSIS — R2689 Other abnormalities of gait and mobility: Secondary | ICD-10-CM | POA: Diagnosis not present

## 2021-04-13 DIAGNOSIS — Z9181 History of falling: Secondary | ICD-10-CM | POA: Diagnosis not present

## 2021-04-13 DIAGNOSIS — G9009 Other idiopathic peripheral autonomic neuropathy: Secondary | ICD-10-CM | POA: Diagnosis not present

## 2021-04-13 DIAGNOSIS — G629 Polyneuropathy, unspecified: Secondary | ICD-10-CM | POA: Diagnosis not present

## 2021-04-13 DIAGNOSIS — K222 Esophageal obstruction: Secondary | ICD-10-CM | POA: Diagnosis not present

## 2021-04-13 DIAGNOSIS — R2681 Unsteadiness on feet: Secondary | ICD-10-CM | POA: Diagnosis not present

## 2021-04-13 DIAGNOSIS — R1310 Dysphagia, unspecified: Secondary | ICD-10-CM | POA: Diagnosis not present

## 2021-04-13 DIAGNOSIS — R69 Illness, unspecified: Secondary | ICD-10-CM | POA: Diagnosis not present

## 2021-04-13 DIAGNOSIS — E1122 Type 2 diabetes mellitus with diabetic chronic kidney disease: Secondary | ICD-10-CM | POA: Diagnosis not present

## 2021-04-13 DIAGNOSIS — R531 Weakness: Secondary | ICD-10-CM | POA: Diagnosis not present

## 2021-04-13 DIAGNOSIS — K219 Gastro-esophageal reflux disease without esophagitis: Secondary | ICD-10-CM | POA: Diagnosis not present

## 2021-04-17 DIAGNOSIS — E1122 Type 2 diabetes mellitus with diabetic chronic kidney disease: Secondary | ICD-10-CM | POA: Diagnosis not present

## 2021-04-17 DIAGNOSIS — R531 Weakness: Secondary | ICD-10-CM | POA: Diagnosis not present

## 2021-04-17 DIAGNOSIS — G629 Polyneuropathy, unspecified: Secondary | ICD-10-CM | POA: Diagnosis not present

## 2021-04-17 DIAGNOSIS — R1310 Dysphagia, unspecified: Secondary | ICD-10-CM | POA: Diagnosis not present

## 2021-04-19 ENCOUNTER — Ambulatory Visit: Payer: Medicare HMO | Admitting: Podiatry

## 2021-04-23 ENCOUNTER — Telehealth: Payer: Self-pay

## 2021-04-23 DIAGNOSIS — K219 Gastro-esophageal reflux disease without esophagitis: Secondary | ICD-10-CM | POA: Diagnosis not present

## 2021-04-23 DIAGNOSIS — K222 Esophageal obstruction: Secondary | ICD-10-CM | POA: Diagnosis not present

## 2021-04-23 DIAGNOSIS — R1310 Dysphagia, unspecified: Secondary | ICD-10-CM | POA: Diagnosis not present

## 2021-04-23 DIAGNOSIS — R531 Weakness: Secondary | ICD-10-CM | POA: Diagnosis not present

## 2021-04-23 NOTE — Telephone Encounter (Signed)
? ?  Pre-operative Risk Assessment  ?  ?Patient Name: [name]  ?DOB: 1932/07/18 ?MRN: [id number]  ? ?  ? ?Request for Surgical Clearance   ? ?Procedure:   EGD with dilation  ? ?Date of Surgery:  Clearance 05/03/21                              ?   ?Surgeon:  Dr.Hung ?Surgeon's Group or Practice Name:  [facility] ?Phone number:  [phone] ?Fax number:  [phone] ?  ?Type of Clearance Requested:   ?- Medical  ?- Pharmacy:  Hold Aspirin   ?  ?Type of Anesthesia:   Propofol ?  ?Additional requests/questions:  Please advise surgeon/provider what medications should be held. ?Please fax a copy of clearance to the surgeon's office. ? ?Signed, ?Thedore Mins Cheetara Hoge   ?04/23/2021, 3:14 PM  ? ?

## 2021-04-23 NOTE — Telephone Encounter (Signed)
? ?  Patient Name: [name]  ?DOB: 1932/06/13 ?MRN: [id number] ? ?Primary Cardiologist: Quay Burow, MD ? ?Chart reviewed as part of pre-operative protocol coverage. Last OV 02/2021 with history of HTN, HLD, CAD s/p prior PCI, TAA (104m by echo 06/2020 with Dr. BGwenlyn Foundrecommending to f/u echo in 06/2021). Last ischemic assessment was by nuc in 2019 which was normal. Last echo was 06/2020 as noted otherwise with EF 60-65%. Given EGD with dilation under propofol, do not anticipate needing full VV but do recommend call to ensure no new issues from 02/2021 OV. I reached out to patient for update on how he is doing. The patient affirms he has been doing well without any new cardiac symptoms. Therefore, the patient would be at acceptable risk for the planned procedure without further cardiovascular testing. The patient was advised that if he develops new symptoms prior to surgery to contact our office to arrange for a follow-up visit, and he verbalized understanding. ? ?The patient was previously granted permission by Dr. BGwenlyn Foundin an unrelated clearance to hold aspirin up to 7 days if needed. The patient states the GI team had told him 5-7 days. We will defer to Dr. HUlyses Amoroffice to relay to the patient their final recommendation of how long to hold aspirin. ? ?Will route this bundled recommendation to requesting provider via Epic fax function. Please call with questions. ? ? ?DCharlie Pitter PA-C ?04/23/2021, 3:52 PM ? ? ?

## 2021-04-26 ENCOUNTER — Other Ambulatory Visit: Payer: Self-pay | Admitting: *Deleted

## 2021-04-26 NOTE — Patient Outreach (Signed)
[facility] Post- Acute Care Coordinator follow up. Per [place] eligible member currently resides in [facility].  Screening for care coordination/care management needs as a benefit of member's insurance plan. ? ?Member's PCP at [facility] has [facility] care management services. ? ?Update received from [place], Admissions Coordinator with [facility] reporting [name] is a [place] resident. Transition plan is to return to [place].  ? ?No identifiable care coordination/care management needs at this time.  ? ? ? ?[name] Rolling, MSN, RN,BSN ?[place] Coordinator ?[phone] [facility]) ?[phone]  (Toll free office)   ?

## 2021-04-27 DIAGNOSIS — E1122 Type 2 diabetes mellitus with diabetic chronic kidney disease: Secondary | ICD-10-CM | POA: Diagnosis not present

## 2021-04-30 DIAGNOSIS — E1122 Type 2 diabetes mellitus with diabetic chronic kidney disease: Secondary | ICD-10-CM | POA: Diagnosis not present

## 2021-05-03 DIAGNOSIS — R1310 Dysphagia, unspecified: Secondary | ICD-10-CM | POA: Diagnosis not present

## 2021-05-03 DIAGNOSIS — K449 Diaphragmatic hernia without obstruction or gangrene: Secondary | ICD-10-CM | POA: Diagnosis not present

## 2021-05-03 DIAGNOSIS — K3189 Other diseases of stomach and duodenum: Secondary | ICD-10-CM | POA: Diagnosis not present

## 2021-05-03 DIAGNOSIS — E1122 Type 2 diabetes mellitus with diabetic chronic kidney disease: Secondary | ICD-10-CM | POA: Diagnosis not present

## 2021-05-03 DIAGNOSIS — K222 Esophageal obstruction: Secondary | ICD-10-CM | POA: Diagnosis not present

## 2021-05-04 DIAGNOSIS — E1122 Type 2 diabetes mellitus with diabetic chronic kidney disease: Secondary | ICD-10-CM | POA: Diagnosis not present

## 2021-05-04 DIAGNOSIS — S91301A Unspecified open wound, right foot, initial encounter: Secondary | ICD-10-CM | POA: Diagnosis not present

## 2021-05-04 DIAGNOSIS — R1310 Dysphagia, unspecified: Secondary | ICD-10-CM | POA: Diagnosis not present

## 2021-05-04 DIAGNOSIS — Z7689 Persons encountering health services in other specified circumstances: Secondary | ICD-10-CM | POA: Diagnosis not present

## 2021-05-08 DIAGNOSIS — R2689 Other abnormalities of gait and mobility: Secondary | ICD-10-CM | POA: Diagnosis not present

## 2021-05-08 DIAGNOSIS — G9009 Other idiopathic peripheral autonomic neuropathy: Secondary | ICD-10-CM | POA: Diagnosis not present

## 2021-05-09 DIAGNOSIS — R2689 Other abnormalities of gait and mobility: Secondary | ICD-10-CM | POA: Diagnosis not present

## 2021-05-09 DIAGNOSIS — G9009 Other idiopathic peripheral autonomic neuropathy: Secondary | ICD-10-CM | POA: Diagnosis not present

## 2021-05-11 DIAGNOSIS — R2689 Other abnormalities of gait and mobility: Secondary | ICD-10-CM | POA: Diagnosis not present

## 2021-05-11 DIAGNOSIS — G9009 Other idiopathic peripheral autonomic neuropathy: Secondary | ICD-10-CM | POA: Diagnosis not present

## 2021-05-14 DIAGNOSIS — M6281 Muscle weakness (generalized): Secondary | ICD-10-CM | POA: Diagnosis not present

## 2021-05-14 DIAGNOSIS — S91301D Unspecified open wound, right foot, subsequent encounter: Secondary | ICD-10-CM | POA: Diagnosis not present

## 2021-05-14 DIAGNOSIS — G9009 Other idiopathic peripheral autonomic neuropathy: Secondary | ICD-10-CM | POA: Diagnosis not present

## 2021-05-14 DIAGNOSIS — R2689 Other abnormalities of gait and mobility: Secondary | ICD-10-CM | POA: Diagnosis not present

## 2021-05-15 DIAGNOSIS — E559 Vitamin D deficiency, unspecified: Secondary | ICD-10-CM | POA: Diagnosis not present

## 2021-05-15 DIAGNOSIS — E119 Type 2 diabetes mellitus without complications: Secondary | ICD-10-CM | POA: Diagnosis not present

## 2021-05-15 DIAGNOSIS — N390 Urinary tract infection, site not specified: Secondary | ICD-10-CM | POA: Diagnosis not present

## 2021-05-15 DIAGNOSIS — E0590 Thyrotoxicosis, unspecified without thyrotoxic crisis or storm: Secondary | ICD-10-CM | POA: Diagnosis not present

## 2021-05-16 DIAGNOSIS — S91301D Unspecified open wound, right foot, subsequent encounter: Secondary | ICD-10-CM | POA: Diagnosis not present

## 2021-05-16 DIAGNOSIS — D649 Anemia, unspecified: Secondary | ICD-10-CM | POA: Diagnosis not present

## 2021-05-16 DIAGNOSIS — D75839 Thrombocytosis, unspecified: Secondary | ICD-10-CM | POA: Diagnosis not present

## 2021-05-16 DIAGNOSIS — N182 Chronic kidney disease, stage 2 (mild): Secondary | ICD-10-CM | POA: Diagnosis not present

## 2021-05-17 DIAGNOSIS — G9009 Other idiopathic peripheral autonomic neuropathy: Secondary | ICD-10-CM | POA: Diagnosis not present

## 2021-05-17 DIAGNOSIS — R2689 Other abnormalities of gait and mobility: Secondary | ICD-10-CM | POA: Diagnosis not present

## 2021-05-21 DIAGNOSIS — R2689 Other abnormalities of gait and mobility: Secondary | ICD-10-CM | POA: Diagnosis not present

## 2021-05-21 DIAGNOSIS — E559 Vitamin D deficiency, unspecified: Secondary | ICD-10-CM | POA: Diagnosis not present

## 2021-05-21 DIAGNOSIS — G9009 Other idiopathic peripheral autonomic neuropathy: Secondary | ICD-10-CM | POA: Diagnosis not present

## 2021-05-22 DIAGNOSIS — R2689 Other abnormalities of gait and mobility: Secondary | ICD-10-CM | POA: Diagnosis not present

## 2021-05-22 DIAGNOSIS — G9009 Other idiopathic peripheral autonomic neuropathy: Secondary | ICD-10-CM | POA: Diagnosis not present

## 2021-05-23 DIAGNOSIS — Z7689 Persons encountering health services in other specified circumstances: Secondary | ICD-10-CM | POA: Diagnosis not present

## 2021-05-24 DIAGNOSIS — R2689 Other abnormalities of gait and mobility: Secondary | ICD-10-CM | POA: Diagnosis not present

## 2021-05-24 DIAGNOSIS — G9009 Other idiopathic peripheral autonomic neuropathy: Secondary | ICD-10-CM | POA: Diagnosis not present

## 2021-05-25 DIAGNOSIS — M8588 Other specified disorders of bone density and structure, other site: Secondary | ICD-10-CM | POA: Diagnosis not present

## 2021-05-25 DIAGNOSIS — W19XXXA Unspecified fall, initial encounter: Secondary | ICD-10-CM | POA: Diagnosis not present

## 2021-05-25 DIAGNOSIS — M533 Sacrococcygeal disorders, not elsewhere classified: Secondary | ICD-10-CM | POA: Diagnosis not present

## 2021-05-25 DIAGNOSIS — R52 Pain, unspecified: Secondary | ICD-10-CM | POA: Diagnosis not present

## 2021-05-25 DIAGNOSIS — M47812 Spondylosis without myelopathy or radiculopathy, cervical region: Secondary | ICD-10-CM | POA: Diagnosis not present

## 2021-05-25 DIAGNOSIS — R2689 Other abnormalities of gait and mobility: Secondary | ICD-10-CM | POA: Diagnosis not present

## 2021-05-25 DIAGNOSIS — M160 Bilateral primary osteoarthritis of hip: Secondary | ICD-10-CM | POA: Diagnosis not present

## 2021-05-25 DIAGNOSIS — M6281 Muscle weakness (generalized): Secondary | ICD-10-CM | POA: Diagnosis not present

## 2021-05-28 DIAGNOSIS — G9009 Other idiopathic peripheral autonomic neuropathy: Secondary | ICD-10-CM | POA: Diagnosis not present

## 2021-05-28 DIAGNOSIS — R2689 Other abnormalities of gait and mobility: Secondary | ICD-10-CM | POA: Diagnosis not present

## 2021-05-29 DIAGNOSIS — S91301D Unspecified open wound, right foot, subsequent encounter: Secondary | ICD-10-CM | POA: Diagnosis not present

## 2021-05-29 DIAGNOSIS — Z7689 Persons encountering health services in other specified circumstances: Secondary | ICD-10-CM | POA: Diagnosis not present

## 2021-05-29 DIAGNOSIS — R2689 Other abnormalities of gait and mobility: Secondary | ICD-10-CM | POA: Diagnosis not present

## 2021-05-29 DIAGNOSIS — G9009 Other idiopathic peripheral autonomic neuropathy: Secondary | ICD-10-CM | POA: Diagnosis not present

## 2021-05-31 DIAGNOSIS — G9009 Other idiopathic peripheral autonomic neuropathy: Secondary | ICD-10-CM | POA: Diagnosis not present

## 2021-05-31 DIAGNOSIS — R2689 Other abnormalities of gait and mobility: Secondary | ICD-10-CM | POA: Diagnosis not present

## 2021-06-04 DIAGNOSIS — G9009 Other idiopathic peripheral autonomic neuropathy: Secondary | ICD-10-CM | POA: Diagnosis not present

## 2021-06-04 DIAGNOSIS — R2689 Other abnormalities of gait and mobility: Secondary | ICD-10-CM | POA: Diagnosis not present

## 2021-06-05 DIAGNOSIS — R2689 Other abnormalities of gait and mobility: Secondary | ICD-10-CM | POA: Diagnosis not present

## 2021-06-05 DIAGNOSIS — G9009 Other idiopathic peripheral autonomic neuropathy: Secondary | ICD-10-CM | POA: Diagnosis not present

## 2021-06-06 ENCOUNTER — Encounter (HOSPITAL_BASED_OUTPATIENT_CLINIC_OR_DEPARTMENT_OTHER): Payer: Medicare HMO | Attending: Physician Assistant | Admitting: Physician Assistant

## 2021-06-14 ENCOUNTER — Ambulatory Visit (HOSPITAL_COMMUNITY): Payer: Medicare HMO | Attending: Cardiology

## 2021-06-14 DIAGNOSIS — I712 Thoracic aortic aneurysm, without rupture, unspecified: Secondary | ICD-10-CM | POA: Diagnosis not present

## 2021-06-14 LAB — ECHOCARDIOGRAM COMPLETE: Area-P 1/2: 1.87 cm2

## 2021-06-14 MED ORDER — PERFLUTREN LIPID MICROSPHERE
1.0000 mL | INTRAVENOUS | Status: AC | PRN
  Administered 2021-06-14: 2 mL via INTRAVENOUS

## 2021-07-11 DIAGNOSIS — K222 Esophageal obstruction: Secondary | ICD-10-CM | POA: Diagnosis not present

## 2021-07-11 DIAGNOSIS — R1310 Dysphagia, unspecified: Secondary | ICD-10-CM | POA: Diagnosis not present

## 2021-07-12 DIAGNOSIS — K219 Gastro-esophageal reflux disease without esophagitis: Secondary | ICD-10-CM | POA: Diagnosis not present

## 2021-07-12 DIAGNOSIS — E1122 Type 2 diabetes mellitus with diabetic chronic kidney disease: Secondary | ICD-10-CM | POA: Diagnosis not present

## 2021-07-12 DIAGNOSIS — R1312 Dysphagia, oropharyngeal phase: Secondary | ICD-10-CM | POA: Diagnosis not present

## 2021-07-16 DIAGNOSIS — K219 Gastro-esophageal reflux disease without esophagitis: Secondary | ICD-10-CM | POA: Diagnosis not present

## 2021-07-16 DIAGNOSIS — R1312 Dysphagia, oropharyngeal phase: Secondary | ICD-10-CM | POA: Diagnosis not present

## 2021-07-16 DIAGNOSIS — E1122 Type 2 diabetes mellitus with diabetic chronic kidney disease: Secondary | ICD-10-CM | POA: Diagnosis not present

## 2021-07-18 DIAGNOSIS — E1122 Type 2 diabetes mellitus with diabetic chronic kidney disease: Secondary | ICD-10-CM | POA: Diagnosis not present

## 2021-07-18 DIAGNOSIS — R1312 Dysphagia, oropharyngeal phase: Secondary | ICD-10-CM | POA: Diagnosis not present

## 2021-07-18 DIAGNOSIS — K219 Gastro-esophageal reflux disease without esophagitis: Secondary | ICD-10-CM | POA: Diagnosis not present

## 2021-07-20 DIAGNOSIS — E1122 Type 2 diabetes mellitus with diabetic chronic kidney disease: Secondary | ICD-10-CM | POA: Diagnosis not present

## 2021-07-20 DIAGNOSIS — K219 Gastro-esophageal reflux disease without esophagitis: Secondary | ICD-10-CM | POA: Diagnosis not present

## 2021-07-20 DIAGNOSIS — R1312 Dysphagia, oropharyngeal phase: Secondary | ICD-10-CM | POA: Diagnosis not present

## 2021-07-23 DIAGNOSIS — K219 Gastro-esophageal reflux disease without esophagitis: Secondary | ICD-10-CM | POA: Diagnosis not present

## 2021-07-23 DIAGNOSIS — R1312 Dysphagia, oropharyngeal phase: Secondary | ICD-10-CM | POA: Diagnosis not present

## 2021-07-23 DIAGNOSIS — E1122 Type 2 diabetes mellitus with diabetic chronic kidney disease: Secondary | ICD-10-CM | POA: Diagnosis not present

## 2021-07-25 DIAGNOSIS — E1122 Type 2 diabetes mellitus with diabetic chronic kidney disease: Secondary | ICD-10-CM | POA: Diagnosis not present

## 2021-07-25 DIAGNOSIS — R1312 Dysphagia, oropharyngeal phase: Secondary | ICD-10-CM | POA: Diagnosis not present

## 2021-07-25 DIAGNOSIS — K219 Gastro-esophageal reflux disease without esophagitis: Secondary | ICD-10-CM | POA: Diagnosis not present

## 2021-07-26 DIAGNOSIS — R69 Illness, unspecified: Secondary | ICD-10-CM | POA: Diagnosis not present

## 2021-07-26 DIAGNOSIS — K219 Gastro-esophageal reflux disease without esophagitis: Secondary | ICD-10-CM | POA: Diagnosis not present

## 2021-07-26 DIAGNOSIS — E1159 Type 2 diabetes mellitus with other circulatory complications: Secondary | ICD-10-CM | POA: Diagnosis not present

## 2021-07-26 DIAGNOSIS — I10 Essential (primary) hypertension: Secondary | ICD-10-CM | POA: Diagnosis not present

## 2021-07-27 DIAGNOSIS — L84 Corns and callosities: Secondary | ICD-10-CM | POA: Diagnosis not present

## 2021-07-27 DIAGNOSIS — I2510 Atherosclerotic heart disease of native coronary artery without angina pectoris: Secondary | ICD-10-CM | POA: Diagnosis not present

## 2021-07-27 DIAGNOSIS — R1312 Dysphagia, oropharyngeal phase: Secondary | ICD-10-CM | POA: Diagnosis not present

## 2021-07-27 DIAGNOSIS — E1122 Type 2 diabetes mellitus with diabetic chronic kidney disease: Secondary | ICD-10-CM | POA: Diagnosis not present

## 2021-07-27 DIAGNOSIS — K219 Gastro-esophageal reflux disease without esophagitis: Secondary | ICD-10-CM | POA: Diagnosis not present

## 2021-07-29 DIAGNOSIS — E1122 Type 2 diabetes mellitus with diabetic chronic kidney disease: Secondary | ICD-10-CM | POA: Diagnosis not present

## 2021-07-29 DIAGNOSIS — R1312 Dysphagia, oropharyngeal phase: Secondary | ICD-10-CM | POA: Diagnosis not present

## 2021-07-29 DIAGNOSIS — K219 Gastro-esophageal reflux disease without esophagitis: Secondary | ICD-10-CM | POA: Diagnosis not present

## 2021-08-01 DIAGNOSIS — E1122 Type 2 diabetes mellitus with diabetic chronic kidney disease: Secondary | ICD-10-CM | POA: Diagnosis not present

## 2021-08-01 DIAGNOSIS — R1312 Dysphagia, oropharyngeal phase: Secondary | ICD-10-CM | POA: Diagnosis not present

## 2021-08-01 DIAGNOSIS — K219 Gastro-esophageal reflux disease without esophagitis: Secondary | ICD-10-CM | POA: Diagnosis not present

## 2021-08-07 DIAGNOSIS — M6281 Muscle weakness (generalized): Secondary | ICD-10-CM | POA: Diagnosis not present

## 2021-08-07 DIAGNOSIS — R2689 Other abnormalities of gait and mobility: Secondary | ICD-10-CM | POA: Diagnosis not present

## 2021-08-07 DIAGNOSIS — R52 Pain, unspecified: Secondary | ICD-10-CM | POA: Diagnosis not present

## 2021-08-07 DIAGNOSIS — W19XXXA Unspecified fall, initial encounter: Secondary | ICD-10-CM | POA: Diagnosis not present

## 2021-08-13 DIAGNOSIS — C44519 Basal cell carcinoma of skin of other part of trunk: Secondary | ICD-10-CM | POA: Diagnosis not present

## 2021-08-13 DIAGNOSIS — D225 Melanocytic nevi of trunk: Secondary | ICD-10-CM | POA: Diagnosis not present

## 2021-08-13 DIAGNOSIS — X32XXXA Exposure to sunlight, initial encounter: Secondary | ICD-10-CM | POA: Diagnosis not present

## 2021-08-13 DIAGNOSIS — L570 Actinic keratosis: Secondary | ICD-10-CM | POA: Diagnosis not present

## 2021-08-15 DIAGNOSIS — Z961 Presence of intraocular lens: Secondary | ICD-10-CM | POA: Diagnosis not present

## 2021-08-15 DIAGNOSIS — Z135 Encounter for screening for eye and ear disorders: Secondary | ICD-10-CM | POA: Diagnosis not present

## 2021-08-15 DIAGNOSIS — E119 Type 2 diabetes mellitus without complications: Secondary | ICD-10-CM | POA: Diagnosis not present

## 2021-08-15 DIAGNOSIS — H5203 Hypermetropia, bilateral: Secondary | ICD-10-CM | POA: Diagnosis not present

## 2021-08-15 DIAGNOSIS — H524 Presbyopia: Secondary | ICD-10-CM | POA: Diagnosis not present

## 2021-08-15 DIAGNOSIS — H52223 Regular astigmatism, bilateral: Secondary | ICD-10-CM | POA: Diagnosis not present

## 2021-08-28 DIAGNOSIS — E1159 Type 2 diabetes mellitus with other circulatory complications: Secondary | ICD-10-CM | POA: Diagnosis not present

## 2021-08-28 DIAGNOSIS — E11621 Type 2 diabetes mellitus with foot ulcer: Secondary | ICD-10-CM | POA: Diagnosis not present

## 2021-08-28 DIAGNOSIS — D473 Essential (hemorrhagic) thrombocythemia: Secondary | ICD-10-CM | POA: Diagnosis not present

## 2021-08-28 DIAGNOSIS — E1122 Type 2 diabetes mellitus with diabetic chronic kidney disease: Secondary | ICD-10-CM | POA: Diagnosis not present

## 2021-08-28 DIAGNOSIS — E039 Hypothyroidism, unspecified: Secondary | ICD-10-CM | POA: Diagnosis not present

## 2021-08-28 DIAGNOSIS — K219 Gastro-esophageal reflux disease without esophagitis: Secondary | ICD-10-CM | POA: Diagnosis not present

## 2021-09-04 DIAGNOSIS — I2510 Atherosclerotic heart disease of native coronary artery without angina pectoris: Secondary | ICD-10-CM | POA: Diagnosis not present

## 2021-09-04 DIAGNOSIS — M6281 Muscle weakness (generalized): Secondary | ICD-10-CM | POA: Diagnosis not present

## 2021-09-04 DIAGNOSIS — Z0000 Encounter for general adult medical examination without abnormal findings: Secondary | ICD-10-CM | POA: Diagnosis not present

## 2021-09-04 DIAGNOSIS — E1122 Type 2 diabetes mellitus with diabetic chronic kidney disease: Secondary | ICD-10-CM | POA: Diagnosis not present

## 2021-09-04 DIAGNOSIS — I129 Hypertensive chronic kidney disease with stage 1 through stage 4 chronic kidney disease, or unspecified chronic kidney disease: Secondary | ICD-10-CM | POA: Diagnosis not present

## 2021-09-04 DIAGNOSIS — E785 Hyperlipidemia, unspecified: Secondary | ICD-10-CM | POA: Diagnosis not present

## 2021-09-04 DIAGNOSIS — E1159 Type 2 diabetes mellitus with other circulatory complications: Secondary | ICD-10-CM | POA: Diagnosis not present

## 2021-09-04 DIAGNOSIS — L210 Seborrhea capitis: Secondary | ICD-10-CM | POA: Diagnosis not present

## 2021-09-04 DIAGNOSIS — R251 Tremor, unspecified: Secondary | ICD-10-CM | POA: Diagnosis not present

## 2021-09-04 DIAGNOSIS — D473 Essential (hemorrhagic) thrombocythemia: Secondary | ICD-10-CM | POA: Diagnosis not present

## 2021-09-20 ENCOUNTER — Other Ambulatory Visit: Payer: Self-pay

## 2021-09-20 ENCOUNTER — Inpatient Hospital Stay: Payer: Medicare HMO | Attending: Internal Medicine

## 2021-09-20 ENCOUNTER — Inpatient Hospital Stay (HOSPITAL_BASED_OUTPATIENT_CLINIC_OR_DEPARTMENT_OTHER): Payer: Medicare HMO | Admitting: Internal Medicine

## 2021-09-20 VITALS — BP 119/62 | HR 68 | Temp 97.5°F | Resp 15 | Wt 198.5 lb

## 2021-09-20 DIAGNOSIS — D75839 Thrombocytosis, unspecified: Secondary | ICD-10-CM

## 2021-09-20 DIAGNOSIS — D45 Polycythemia vera: Secondary | ICD-10-CM | POA: Insufficient documentation

## 2021-09-20 DIAGNOSIS — E119 Type 2 diabetes mellitus without complications: Secondary | ICD-10-CM | POA: Diagnosis not present

## 2021-09-20 DIAGNOSIS — I2510 Atherosclerotic heart disease of native coronary artery without angina pectoris: Secondary | ICD-10-CM | POA: Diagnosis not present

## 2021-09-20 DIAGNOSIS — Z7984 Long term (current) use of oral hypoglycemic drugs: Secondary | ICD-10-CM | POA: Diagnosis not present

## 2021-09-20 DIAGNOSIS — E785 Hyperlipidemia, unspecified: Secondary | ICD-10-CM | POA: Diagnosis not present

## 2021-09-20 DIAGNOSIS — Z79899 Other long term (current) drug therapy: Secondary | ICD-10-CM | POA: Insufficient documentation

## 2021-09-20 DIAGNOSIS — I10 Essential (primary) hypertension: Secondary | ICD-10-CM | POA: Insufficient documentation

## 2021-09-20 DIAGNOSIS — Z7982 Long term (current) use of aspirin: Secondary | ICD-10-CM | POA: Insufficient documentation

## 2021-09-20 LAB — CBC WITH DIFFERENTIAL (CANCER CENTER ONLY)
Abs Immature Granulocytes: 0.08 10*3/uL — ABNORMAL HIGH (ref 0.00–0.07)
Basophils Absolute: 0.2 10*3/uL — ABNORMAL HIGH (ref 0.0–0.1)
Basophils Relative: 2 %
Eosinophils Absolute: 0.3 10*3/uL (ref 0.0–0.5)
Eosinophils Relative: 2 %
HCT: 46.9 % (ref 39.0–52.0)
Hemoglobin: 14.6 g/dL (ref 13.0–17.0)
Immature Granulocytes: 1 %
Lymphocytes Relative: 10 %
Lymphs Abs: 1.3 10*3/uL (ref 0.7–4.0)
MCH: 25.7 pg — ABNORMAL LOW (ref 26.0–34.0)
MCHC: 31.1 g/dL (ref 30.0–36.0)
MCV: 82.7 fL (ref 80.0–100.0)
Monocytes Absolute: 1 10*3/uL (ref 0.1–1.0)
Monocytes Relative: 7 %
Neutro Abs: 10 10*3/uL — ABNORMAL HIGH (ref 1.7–7.7)
Neutrophils Relative %: 78 %
Platelet Count: 514 10*3/uL — ABNORMAL HIGH (ref 150–400)
RBC: 5.67 MIL/uL (ref 4.22–5.81)
RDW: 15 % (ref 11.5–15.5)
WBC Count: 12.9 10*3/uL — ABNORMAL HIGH (ref 4.0–10.5)
nRBC: 0 % (ref 0.0–0.2)

## 2021-09-20 LAB — CMP (CANCER CENTER ONLY)
ALT: 12 U/L (ref 0–44)
AST: 11 U/L — ABNORMAL LOW (ref 15–41)
Albumin: 3.9 g/dL (ref 3.5–5.0)
Alkaline Phosphatase: 59 U/L (ref 38–126)
Anion gap: 5 (ref 5–15)
BUN: 20 mg/dL (ref 8–23)
CO2: 28 mmol/L (ref 22–32)
Calcium: 10 mg/dL (ref 8.9–10.3)
Chloride: 100 mmol/L (ref 98–111)
Creatinine: 1.22 mg/dL (ref 0.61–1.24)
GFR, Estimated: 58 mL/min — ABNORMAL LOW (ref >60)
Glucose, Bld: 151 mg/dL — ABNORMAL HIGH (ref 70–99)
Potassium: 4.2 mmol/L (ref 3.5–5.1)
Sodium: 133 mmol/L — ABNORMAL LOW (ref 135–145)
Total Bilirubin: 0.5 mg/dL (ref 0.3–1.2)
Total Protein: 7.7 g/dL (ref 6.5–8.1)

## 2021-09-20 LAB — LACTATE DEHYDROGENASE: LDH: 154 U/L (ref 98–192)

## 2021-09-20 NOTE — Progress Notes (Signed)
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  PRIOR THERAPY: Phlebotomy on as-needed basis.   CURRENT THERAPY: Hydroxyurea 500 mg by mouth daily.   INTERVAL HISTORY: [name] 88 y.o. male returns to the clinic today for follow-up visit accompanied by his son.  The patient is feeling fine today with no concerning complaints.  He denied having any chest pain, shortness of breath, cough or hemoptysis.  He denied having any fever or chills.  He has no nausea, vomiting, diarrhea or constipation.  He has no headache or visual changes.  He has no recent weight loss or night sweats.  He has some tremors in his hand concerning for early parkinsonism.  He is scheduled to see neurology soon.  He continues to tolerate his treatment with hydroxyurea fairly well.  The patient is here today for evaluation and repeat blood work.  MEDICAL HISTORY: Past Medical History:  Diagnosis Date   Chest pain    Coronary artery disease    Diabetes mellitus without complication ([place])    Hyperlipidemia    Hypertension     ALLERGIES:  has No Known Allergies.  MEDICATIONS:  Current Outpatient Medications  Medication Sig Dispense Refill   acetaminophen (TYLENOL) 325 MG tablet Take 650 mg by mouth every 6 (six) hours as needed for moderate pain. Reported on 01/23/2015     ASPIRIN 81 PO Take 81 mg by mouth daily.     CINNAMON PO Take 1 capsule by mouth 2 (two) times daily.     clobetasol cream (TEMOVATE) 0.05 %      furosemide (LASIX) 40 MG tablet Take 1 tablet (40 mg total) by mouth daily. 90 tablet 3   gabapentin (NEURONTIN) 100 MG capsule Take 100 mg by mouth 2 (two) times daily.     glimepiride (AMARYL) 4 MG tablet Take 4 mg by mouth daily before breakfast.     hydrocortisone  2.5 % cream APPLY TOPICALLY TO AFFECTED AREA TO RASH  QD PRN  1   hydroxyurea (HYDREA) 500 MG capsule TAKE 1 CAPSULE BY MOUTH ONCE DAILY WITH FOOD 90 capsule 0   ketoconazole (NIZORAL) 2 % shampoo Apply topically 2 (two) times a week.     losartan (COZAAR) 100 MG tablet Take 100 mg by mouth daily.     metoprolol tartrate (LOPRESSOR) 50 MG tablet Take 1 tablet by mouth twice daily 180 tablet 3   Multiple Vitamin (MULTIVITAMIN WITH MINERALS) TABS tablet Take 1 tablet by mouth every morning.     Omega-3 Fatty Acids (FISH OIL) 1200 MG CAPS Take 1 capsule by mouth 2 (two) times daily.     omeprazole (PRILOSEC) 40 MG capsule Take 40 mg by mouth daily.     silver sulfADIAZINE (SSD) 1 % cream APPLY PEA-SIZED AMOUNT TO WOUND ONCE DAILY 50 g 0   simvastatin (ZOCOR) 40 MG tablet TAKE 1 TABLET BY MOUTH ONCE DAILY IN THE EVENING 90 tablet 3   No current facility-administered medications for this visit.    SURGICAL HISTORY:  Past Surgical History:  Procedure Laterality Date   CARDIAC CATHETERIZATION  11/01/2005   patent stents with normal LV function   CARDIAC CATHETERIZATION  11/08/2003   cypher stent mid dominant right coronary lesion   [place]  post LAD and RCA stenting   DOPPLER ECHOCARDIOGRAPHY  06/18/2010   EF =>55%,LV normal   EYE SURGERY     holter monitor  11/08/2005   NM MYOCAR PERF WALL MOTION  05/23/2008   EF 62% ,norm myocardial perfusion    REVIEW OF SYSTEMS:  A comprehensive review of systems was negative except for: Constitutional: positive for fatigue Neurological: positive for tremors   PHYSICAL EXAMINATION: General appearance: alert, cooperative and no distress Head: Normocephalic, without obvious abnormality, atraumatic Neck: no adenopathy, no JVD, supple, symmetrical, trachea midline and thyroid not enlarged, symmetric, no tenderness/mass/nodules Lymph nodes: Cervical, supraclavicular, and axillary nodes normal. Resp: clear to  auscultation bilaterally Back: symmetric, no curvature. ROM normal. No CVA tenderness. Cardio: regular rate and rhythm, S1, S2 normal, no murmur, click, rub or gallop GI: soft, non-tender; bowel sounds normal; no masses,  no organomegaly Extremities: extremities normal, atraumatic, no cyanosis or edema  ECOG PERFORMANCE STATUS: 1 - Symptomatic but completely ambulatory  Blood pressure 119/62, pulse 68, temperature (!) 97.5 F (36.4 C), temperature source Oral, resp. rate 15, weight 198 lb 8 oz (90 kg), SpO2 99 %.  LABORATORY DATA: Lab Results  Component Value Date   WBC 10.3 03/21/2021   HGB 13.7 03/21/2021   HCT 44.5 03/21/2021   MCV 87.3 03/21/2021   PLT 489 (H) 03/21/2021      Chemistry      Component Value Date/Time   NA 135 03/21/2021 1032   NA 137 10/29/2016 1018   K 5.0 03/21/2021 1032   K 4.9 10/29/2016 1018   CL 103 03/21/2021 1032   CO2 29 03/21/2021 1032   CO2 25 10/29/2016 1018   BUN 22 03/21/2021 1032   BUN 13.5 10/29/2016 1018   CREATININE 1.53 (H) 03/21/2021 1032   CREATININE 1.2 10/29/2016 1018      Component Value Date/Time   CALCIUM 9.6 03/21/2021 1032   CALCIUM 9.1 10/29/2016 1018   ALKPHOS 46 03/21/2021 1032   ALKPHOS 62 10/29/2016 1018   AST 13 (L) 03/21/2021 1032   AST 18 10/29/2016 1018   ALT 11 03/21/2021 1032   ALT 12 10/29/2016 1018   BILITOT 0.7 03/21/2021 1032   BILITOT 0.46 10/29/2016 1018       RADIOGRAPHIC STUDIES: No results found.  ASSESSMENT AND PLAN:  This is a very pleasant 88 years old white male with history of myeloproliferative disorder, polycythemia vera with positive JAK 2 mutation. The patient is currently on treatment with hydroxyurea 500 mg p.o. daily.  The patient has been tolerating his treatment with hydroxyurea fairly well with no concerning complaints. Repeat CBC today showed mild leukocytosis as well as thrombocytosis. I recommended for the patient to continue his current treatment with hydroxyurea with the  same dose. I will see him back for follow-up visit in 6 months for evaluation and repeat blood work. He was advised to call immediately if he has any other concerning symptoms in the interval. The patient voices understanding of current disease status and treatment options and is in agreement with the current care plan. All questions were answered. The patient knows to call the clinic with any problems, questions or concerns. We can certainly see the patient much sooner if necessary.  Disclaimer: This note was dictated with voice recognition software. Similar sounding words can inadvertently be transcribed and may not be corrected upon review.

## 2021-09-21 ENCOUNTER — Telehealth: Payer: Self-pay | Admitting: Internal Medicine

## 2021-09-21 NOTE — Telephone Encounter (Signed)
Scheduled follow-up appointment per 9/14 los. Patient is aware.

## 2021-09-24 DIAGNOSIS — L84 Corns and callosities: Secondary | ICD-10-CM | POA: Diagnosis not present

## 2021-09-24 DIAGNOSIS — I10 Essential (primary) hypertension: Secondary | ICD-10-CM | POA: Diagnosis not present

## 2021-09-24 DIAGNOSIS — R52 Pain, unspecified: Secondary | ICD-10-CM | POA: Diagnosis not present

## 2021-09-24 DIAGNOSIS — E1159 Type 2 diabetes mellitus with other circulatory complications: Secondary | ICD-10-CM | POA: Diagnosis not present

## 2021-09-25 DIAGNOSIS — Z85828 Personal history of other malignant neoplasm of skin: Secondary | ICD-10-CM | POA: Diagnosis not present

## 2021-09-25 DIAGNOSIS — Z08 Encounter for follow-up examination after completed treatment for malignant neoplasm: Secondary | ICD-10-CM | POA: Diagnosis not present

## 2021-09-25 DIAGNOSIS — X32XXXD Exposure to sunlight, subsequent encounter: Secondary | ICD-10-CM | POA: Diagnosis not present

## 2021-09-25 DIAGNOSIS — L218 Other seborrheic dermatitis: Secondary | ICD-10-CM | POA: Diagnosis not present

## 2021-09-25 DIAGNOSIS — L570 Actinic keratosis: Secondary | ICD-10-CM | POA: Diagnosis not present

## 2021-09-26 DIAGNOSIS — E1122 Type 2 diabetes mellitus with diabetic chronic kidney disease: Secondary | ICD-10-CM | POA: Diagnosis not present

## 2021-09-26 DIAGNOSIS — I2510 Atherosclerotic heart disease of native coronary artery without angina pectoris: Secondary | ICD-10-CM | POA: Diagnosis not present

## 2021-09-26 DIAGNOSIS — K219 Gastro-esophageal reflux disease without esophagitis: Secondary | ICD-10-CM | POA: Diagnosis not present

## 2021-09-26 DIAGNOSIS — L84 Corns and callosities: Secondary | ICD-10-CM | POA: Diagnosis not present

## 2021-10-01 ENCOUNTER — Ambulatory Visit: Payer: Medicare HMO | Admitting: Neurology

## 2021-10-01 ENCOUNTER — Telehealth: Payer: Self-pay | Admitting: Neurology

## 2021-10-01 ENCOUNTER — Encounter: Payer: Self-pay | Admitting: Neurology

## 2021-10-01 VITALS — BP 125/75 | HR 64 | Ht 72.0 in | Wt 190.0 lb

## 2021-10-01 DIAGNOSIS — G20 Parkinson's disease: Secondary | ICD-10-CM | POA: Diagnosis not present

## 2021-10-01 DIAGNOSIS — G20C Parkinsonism, unspecified: Secondary | ICD-10-CM | POA: Insufficient documentation

## 2021-10-01 DIAGNOSIS — R269 Unspecified abnormalities of gait and mobility: Secondary | ICD-10-CM | POA: Diagnosis not present

## 2021-10-01 MED ORDER — CARBIDOPA-LEVODOPA 25-100 MG PO TABS: 1.0000 | ORAL_TABLET | Freq: Three times a day (TID) | ORAL | 11 refills | Status: DC

## 2021-10-01 NOTE — Progress Notes (Signed)
Chief Complaint  Patient presents with   New Patient (Initial Visit)    Rm 13. Accompanied by son. [name] 2014/Paper [name] Binet MD [facility] [phone] hand tremor and L leg weakness, r/o parkinsons.      ASSESSMENT AND PLAN  [name] is a 88 y.o. male   Parkinsonism features,  Left more than right, likely idiopathic Parkinson's disease  Sinemet 25/100 mg 3 times daily at 8, 12, 5 PM  MRI of the brain without contrast    DIAGNOSTIC DATA (LABS, IMAGING, TESTING) - I reviewed patient records, labs, notes, testing and imaging myself where available.   MEDICAL HISTORY:  [name] is a 88 year old male, accompanied by son, seen in request by primary care doctor [name], for evaluation of worsening gait abnormality, increased left hand tremor, initial evaluation was on October 01, 2021  I reviewed and summarized the referring note. 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, around 2021, he noticed left hand tremor, gradually increased gait abnormality, difficult to work his left leg, he progressed from cane, to walker, given that he has increased difficulty,  He denied loss sense of smell, sleeping well, denies REM sleep disorder, there was no family history of central nervous system degenerative disorder such as Parkinson's disease, or dementia,  He denies bowel or bladder incontinence, but because of gait abnormality, he uses urinal  He spent most of the time in a sitting down position over the last 6 months, only a couple times each week he was assisting with taking a few steps, most of the time he walks in his wheelchair,  Denies significant memory loss,    PHYSICAL EXAM:   Vitals:   10/01/21 1627  BP:  125/75  Pulse: 64  Weight: 190 lb (86.2 kg)  Height: 6' (1.829 m)   Not recorded     Body mass index is 25.77 kg/m.  PHYSICAL EXAMNIATION:  Gen: NAD, conversant, well nourised, well groomed                     Cardiovascular: Regular rate rhythm, no peripheral edema, warm, nontender. Eyes: Conjunctivae clear without exudates or hemorrhage Neck: Supple, no carotid bruits. Pulmonary: Clear to auscultation bilaterally   NEUROLOGICAL EXAM:  MENTAL STATUS: Speech/cognition:   Masked face, only skin, scaly flakes, soft voice  CRANIAL NERVES: CN II: Visual fields are full to confrontation. Pupils are round equal and briskly reactive to light. CN III, IV, VI: Difficulty with vertical gaze CN V: Facial sensation is intact to light touch CN VII: Face is symmetric with normal eye closure  CN VIII: Hearing is normal to causal conversation. CN IX, X: Phonation is normal. CN XI: Head turning and shoulder shrug are intact  MOTOR: Constant left hand resting tremor, no significant weakness, left more than right bradykinesia, rigidity, mild contraction of bilateral knee, maximum extension 170 degree   REFLEXES: hypoactive and symmetric  SENSORY: Intact to light touch,    COORDINATION: There is no trunk or limb dysmetria noted.  GAIT/STANCE: Need assistance to get up from seated position, significant retropulse instability, difficulty initiate gait  REVIEW OF SYSTEMS:  Full 14 system review of systems performed and notable only for as above All other review of systems were  negative.   ALLERGIES: No Known Allergies  HOME MEDICATIONS: Current Outpatient Medications  Medication Sig Dispense Refill   ASPIRIN 81 PO Take 81 mg by mouth daily.     furosemide (LASIX) 40 MG tablet Take 1 tablet (40 mg total) by mouth daily. 90 tablet 3   hydroxyurea (HYDREA) 500 MG capsule TAKE 1 CAPSULE BY MOUTH ONCE DAILY WITH FOOD 90 capsule 0   losartan (COZAAR) 100 MG tablet Take 100 mg by  mouth daily.     metoprolol tartrate (LOPRESSOR) 50 MG tablet Take 1 tablet by mouth twice daily 180 tablet 3   Multiple Vitamin (MULTIVITAMIN WITH MINERALS) TABS tablet Take 1 tablet by mouth every morning.     omeprazole (PRILOSEC) 40 MG capsule Take 40 mg by mouth daily.     PARoxetine (PAXIL) 10 MG tablet Take 10 mg by mouth daily.     Podiatric Products (AMLACTIN FOOT REPAIR) 15 % CREA Apply topically.     No current facility-administered medications for this visit.    PAST MEDICAL HISTORY: Past Medical History:  Diagnosis Date   Chest pain    Coronary artery disease    Diabetes mellitus without complication ([place])    Hyperlipidemia    Hypertension     PAST SURGICAL HISTORY: Past Surgical History:  Procedure Laterality Date   CARDIAC CATHETERIZATION  11/01/2005   patent stents with normal LV function   CARDIAC CATHETERIZATION  11/08/2003   cypher stent mid dominant right coronary lesion   CARDIAC SURGERY     CORONARY ANGIOPLASTY     post LAD and RCA stenting   DOPPLER ECHOCARDIOGRAPHY  06/18/2010   EF =>55%,LV normal   EYE SURGERY     holter monitor  11/08/2005   NM MYOCAR PERF WALL MOTION  05/23/2008   EF 62% ,norm myocardial perfusion    FAMILY HISTORY: No family history on file.  SOCIAL HISTORY: Social History   Socioeconomic History   Marital status: Significant Other    Spouse name: Not on file   Number of children: 4   Years of education: 40   Highest education level: Not on file  Occupational History   Not on file  Tobacco Use   Smoking status: Former    Types: Cigarettes   Smokeless tobacco: Former    Quit date: 10/31/1982  Substance and Sexual Activity   Alcohol use: Yes    Comment: 1 glass wine daily   Drug use: No   Sexual activity: Never  Other Topics Concern   Not on file  Social History Narrative   Not on file   Social Determinants of Health   Financial Resource Strain: Not on file  Food Insecurity: Not on file  Transportation  Needs: Not on file  Physical Activity: Not on file  Stress: Not on file  Social Connections: Not on file  Intimate Partner Violence: Not on file      [name], M.D. Ph.D.  [facility] [address], [place], [place] [address] Ph: [phone] Fax: [phone]  CC:  [name], [place],  [place] [address]  [name], DO

## 2021-10-01 NOTE — Telephone Encounter (Signed)
[employer] medicare sent to [facility] they obtain auth  

## 2021-10-03 DIAGNOSIS — L84 Corns and callosities: Secondary | ICD-10-CM | POA: Diagnosis not present

## 2021-10-17 ENCOUNTER — Encounter: Payer: Self-pay | Admitting: Internal Medicine

## 2021-10-17 ENCOUNTER — Ambulatory Visit
Admission: RE | Admit: 2021-10-17 | Discharge: 2021-10-17 | Disposition: A | Discharge status: Home / Self Care | Payer: Medicare HMO | Source: Ambulatory Visit | Attending: Neurology | Admitting: Neurology

## 2021-10-17 DIAGNOSIS — R269 Unspecified abnormalities of gait and mobility: Secondary | ICD-10-CM | POA: Diagnosis not present

## 2021-10-17 DIAGNOSIS — G20C Parkinsonism, unspecified: Secondary | ICD-10-CM | POA: Diagnosis not present

## 2021-10-23 ENCOUNTER — Telehealth: Payer: Self-pay | Admitting: Neurology

## 2021-10-23 DIAGNOSIS — M2041 Other hammer toe(s) (acquired), right foot: Secondary | ICD-10-CM | POA: Diagnosis not present

## 2021-10-23 DIAGNOSIS — M2042 Other hammer toe(s) (acquired), left foot: Secondary | ICD-10-CM | POA: Diagnosis not present

## 2021-10-23 DIAGNOSIS — Z7984 Long term (current) use of oral hypoglycemic drugs: Secondary | ICD-10-CM | POA: Diagnosis not present

## 2021-10-23 DIAGNOSIS — E1151 Type 2 diabetes mellitus with diabetic peripheral angiopathy without gangrene: Secondary | ICD-10-CM | POA: Diagnosis not present

## 2021-10-23 DIAGNOSIS — B351 Tinea unguium: Secondary | ICD-10-CM | POA: Diagnosis not present

## 2021-10-23 DIAGNOSIS — L84 Corns and callosities: Secondary | ICD-10-CM | POA: Diagnosis not present

## 2021-10-23 NOTE — Telephone Encounter (Signed)
Pt called wanting to know if his MRI results have come in. Please advise.

## 2021-10-23 NOTE — Telephone Encounter (Signed)
Pt is requesting a call back from the nurse.

## 2021-10-23 NOTE — Telephone Encounter (Signed)
Mr. DONTREY SNELLGROVE    MRI of brain showed increased generalized atrophy, mild progression compared to 2014. There was no acute abnormality   Marcial Pacas, M.D. Ph.D.   [facility] [place], [place] [address] Phone: [phone] Fax:      [phone]  Written by Marcial Pacas, MD on 10/19/2021  9:01 AM EDT

## 2021-10-23 NOTE — Telephone Encounter (Signed)
I called the pt back and left a vm (ok per dpr) updating on results.  Pt advised to call back with any questions/concerns.

## 2021-10-24 DIAGNOSIS — E1159 Type 2 diabetes mellitus with other circulatory complications: Secondary | ICD-10-CM | POA: Diagnosis not present

## 2021-10-24 DIAGNOSIS — D75839 Thrombocytosis, unspecified: Secondary | ICD-10-CM | POA: Diagnosis not present

## 2021-10-24 DIAGNOSIS — L84 Corns and callosities: Secondary | ICD-10-CM | POA: Diagnosis not present

## 2021-10-24 DIAGNOSIS — I10 Essential (primary) hypertension: Secondary | ICD-10-CM | POA: Diagnosis not present

## 2021-10-24 NOTE — Telephone Encounter (Signed)
I called the pt back, he was unavailable and vm full. Will wait for call back.

## 2021-10-24 NOTE — Telephone Encounter (Signed)
Pt returned my call and we discussed message. He verbalized understanding and appreciation for the call. He requests MRI results be mailed to him for his records.  Will send to medical record coordinator.

## 2021-10-25 DIAGNOSIS — I10 Essential (primary) hypertension: Secondary | ICD-10-CM | POA: Diagnosis not present

## 2021-10-26 DIAGNOSIS — E860 Dehydration: Secondary | ICD-10-CM | POA: Diagnosis not present

## 2021-10-26 DIAGNOSIS — D72829 Elevated white blood cell count, unspecified: Secondary | ICD-10-CM | POA: Diagnosis not present

## 2021-10-26 DIAGNOSIS — E871 Hypo-osmolality and hyponatremia: Secondary | ICD-10-CM | POA: Diagnosis not present

## 2021-10-26 DIAGNOSIS — D649 Anemia, unspecified: Secondary | ICD-10-CM | POA: Diagnosis not present

## 2021-12-02 DIAGNOSIS — M25551 Pain in right hip: Secondary | ICD-10-CM | POA: Diagnosis not present

## 2021-12-03 DIAGNOSIS — W19XXXA Unspecified fall, initial encounter: Secondary | ICD-10-CM | POA: Diagnosis not present

## 2021-12-03 DIAGNOSIS — M6281 Muscle weakness (generalized): Secondary | ICD-10-CM | POA: Diagnosis not present

## 2021-12-03 DIAGNOSIS — R2689 Other abnormalities of gait and mobility: Secondary | ICD-10-CM | POA: Diagnosis not present

## 2021-12-03 DIAGNOSIS — M545 Low back pain, unspecified: Secondary | ICD-10-CM | POA: Diagnosis not present

## 2021-12-05 DIAGNOSIS — M47818 Spondylosis without myelopathy or radiculopathy, sacral and sacrococcygeal region: Secondary | ICD-10-CM | POA: Diagnosis not present

## 2021-12-05 DIAGNOSIS — M47816 Spondylosis without myelopathy or radiculopathy, lumbar region: Secondary | ICD-10-CM | POA: Diagnosis not present

## 2021-12-06 DIAGNOSIS — Z9181 History of falling: Secondary | ICD-10-CM | POA: Diagnosis not present

## 2021-12-06 DIAGNOSIS — R52 Pain, unspecified: Secondary | ICD-10-CM | POA: Diagnosis not present

## 2021-12-06 DIAGNOSIS — M6281 Muscle weakness (generalized): Secondary | ICD-10-CM | POA: Diagnosis not present

## 2021-12-06 DIAGNOSIS — R2689 Other abnormalities of gait and mobility: Secondary | ICD-10-CM | POA: Diagnosis not present

## 2021-12-06 DIAGNOSIS — G8911 Acute pain due to trauma: Secondary | ICD-10-CM | POA: Diagnosis not present

## 2021-12-07 DIAGNOSIS — R52 Pain, unspecified: Secondary | ICD-10-CM | POA: Diagnosis not present

## 2021-12-07 DIAGNOSIS — K5900 Constipation, unspecified: Secondary | ICD-10-CM | POA: Diagnosis not present

## 2021-12-11 ENCOUNTER — Encounter: Payer: Self-pay | Admitting: Internal Medicine

## 2021-12-11 DIAGNOSIS — F32A Depression, unspecified: Secondary | ICD-10-CM | POA: Diagnosis not present

## 2021-12-11 DIAGNOSIS — R69 Illness, unspecified: Secondary | ICD-10-CM | POA: Diagnosis not present

## 2021-12-17 DIAGNOSIS — R69 Illness, unspecified: Secondary | ICD-10-CM | POA: Diagnosis not present

## 2021-12-17 DIAGNOSIS — F330 Major depressive disorder, recurrent, mild: Secondary | ICD-10-CM | POA: Diagnosis not present

## 2021-12-25 ENCOUNTER — Encounter: Payer: Self-pay | Admitting: Internal Medicine

## 2021-12-26 DIAGNOSIS — I2510 Atherosclerotic heart disease of native coronary artery without angina pectoris: Secondary | ICD-10-CM | POA: Diagnosis not present

## 2021-12-26 DIAGNOSIS — K219 Gastro-esophageal reflux disease without esophagitis: Secondary | ICD-10-CM | POA: Diagnosis not present

## 2021-12-26 DIAGNOSIS — L84 Corns and callosities: Secondary | ICD-10-CM | POA: Diagnosis not present

## 2022-01-01 ENCOUNTER — Encounter: Payer: Self-pay | Admitting: Neurology

## 2022-01-01 ENCOUNTER — Ambulatory Visit (INDEPENDENT_AMBULATORY_CARE_PROVIDER_SITE_OTHER): Payer: Medicare HMO | Admitting: Neurology

## 2022-01-01 VITALS — BP 93/61 | HR 80

## 2022-01-01 DIAGNOSIS — G20C Parkinsonism, unspecified: Secondary | ICD-10-CM | POA: Diagnosis not present

## 2022-01-01 DIAGNOSIS — R269 Unspecified abnormalities of gait and mobility: Secondary | ICD-10-CM | POA: Diagnosis not present

## 2022-01-01 DIAGNOSIS — R2681 Unsteadiness on feet: Secondary | ICD-10-CM | POA: Diagnosis not present

## 2022-01-01 MED ORDER — CARBIDOPA-LEVODOPA 25-100 MG PO TABS: 2.0000 | ORAL_TABLET | Freq: Three times a day (TID) | ORAL | 11 refills | Status: AC

## 2022-01-01 MED ORDER — CARBIDOPA-LEVODOPA 25-100 MG PO TABS: 2.0000 | ORAL_TABLET | Freq: Three times a day (TID) | ORAL | 11 refills | Status: DC

## 2022-01-01 NOTE — Progress Notes (Signed)
Chief Complaint  Patient presents with   Follow-up    Rm 62 with son; here for 3 month f/u. Reports sx remain the same reports one fall since last f/u.       ASSESSMENT AND PLAN  [name] is a 88 y.o. male   Parkinsonism  Mild cognitive impairment Gait abnormalities  Left more than right, likely idiopathic Parkinson's disease  Increase Sinemet 25/100 mg to 2 tabs 3 times daily at 8, 12, 5 PM  MRI of the brain without contrast showed significant atrophy  Laboratory evaluation to rule out treatable etiology  His gait abnormality is multifactorial, this include aging, deconditioning, significant low back pain, parkinsonian features,  Restart physical therapy  Return To Clinic With NP In 6 Months    DIAGNOSTIC DATA (LABS, IMAGING, TESTING) - I reviewed patient records, labs, notes, testing and imaging myself where available.   MEDICAL HISTORY:  [name] is a 88 year old male, accompanied by son, seen in request by primary care doctor [name], for evaluation of worsening gait abnormality, increased left hand tremor, initial evaluation was on October 01, 2021  I reviewed and summarized the referring note. 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, around 2021, he noticed left hand tremor, gradually increased gait abnormality, difficult to work his left leg, he progressed from cane, to walker, given that he has increased difficulty,  He denied loss sense of smell, sleeping well, denies REM sleep disorder, there was no family history of central nervous system degenerative disorder such as Parkinson's disease, or dementia,  He denies bowel or bladder incontinence, but because of gait abnormality, he uses urinal  He spent most of the time in a sitting  down position over the last 6 months, only a couple times each week he was assisting with taking a few steps, most of the time he walks in his wheelchair,  Denies significant memory loss,  UPDATE Jan 01 2022: He has been a resident at [facility] for few years, previously independent living, now with increased gait abnormality he is in assisted living, started Sinemet 25/100 mg 3 times daily since September 2023, initially seems to help him some, not so obvious now, his gait abnormality also complicated by his significant low back pain, physical therapy was helpful, but ended, he spent most of the time in sitting down position, able to transfer himself, but with increased difficulty,    PHYSICAL EXAM:   Vitals:   01/01/22 1206  BP: 93/61  Pulse: 80   Not recorded     There is no height or weight on file to calculate BMI.  PHYSICAL EXAMNIATION:  Gen: NAD, conversant, well nourised, well groomed                     Cardiovascular: Regular rate rhythm, no peripheral edema, warm, nontender. Eyes: Conjunctivae clear without exudates or hemorrhage Neck: Supple, no carotid bruits. Pulmonary: Clear to auscultation bilaterally   NEUROLOGICAL EXAM:  MENTAL STATUS: Speech/cognition:   Masked face, only skin, scaly flakes, soft voice  CRANIAL NERVES: CN II: Visual fields are full to confrontation. Pupils are round equal and briskly reactive to light. CN III, IV, VI: Difficulty with vertical gaze CN V: Facial sensation is intact to light touch  CN VII: Face is symmetric with normal eye closure  CN VIII: Hearing is normal to causal conversation. CN IX, X: Phonation is normal. CN XI: Head turning and shoulder shrug are intact  MOTOR: Constant left hand resting tremor, no significant weakness, left more than right bradykinesia, rigidity, mild contraction of bilateral knee, maximum extension 170 degree   REFLEXES: hypoactive and symmetric  SENSORY: Intact to light touch,     COORDINATION: There is no trunk or limb dysmetria noted.  GAIT/STANCE: Need assistance to get up from seated position, significant retropulse instability, difficulty initiate gait  REVIEW OF SYSTEMS:  Full 14 system review of systems performed and notable only for as above All other review of systems were negative.   ALLERGIES: No Known Allergies  HOME MEDICATIONS: Current Outpatient Medications  Medication Sig Dispense Refill   ASPIRIN 81 PO Take 81 mg by mouth daily.     carbidopa-levodopa (SINEMET IR) 25-100 MG tablet Take 1 tablet by mouth 3 (three) times daily. 90 tablet 11   hydroxyurea (HYDREA) 500 MG capsule TAKE 1 CAPSULE BY MOUTH ONCE DAILY WITH FOOD 90 capsule 0   losartan (COZAAR) 100 MG tablet Take 100 mg by mouth daily.     metoprolol tartrate (LOPRESSOR) 50 MG tablet Take 1 tablet by mouth twice daily 180 tablet 3   Multiple Vitamin (MULTIVITAMIN WITH MINERALS) TABS tablet Take 1 tablet by mouth every morning.     omeprazole (PRILOSEC) 40 MG capsule Take 40 mg by mouth daily.     Podiatric Products (AMLACTIN FOOT REPAIR) 15 % CREA Apply topically.     furosemide (LASIX) 40 MG tablet Take 1 tablet (40 mg total) by mouth daily. 90 tablet 3   No current facility-administered medications for this visit.    PAST MEDICAL HISTORY: Past Medical History:  Diagnosis Date   Chest pain    Coronary artery disease    Diabetes mellitus without complication ([place])    Hyperlipidemia    Hypertension     PAST SURGICAL HISTORY: Past Surgical History:  Procedure Laterality Date   CARDIAC CATHETERIZATION  11/01/2005   patent stents with normal LV function   CARDIAC CATHETERIZATION  11/08/2003   cypher stent mid dominant right coronary lesion   CARDIAC SURGERY     CORONARY ANGIOPLASTY     post LAD and RCA stenting   DOPPLER ECHOCARDIOGRAPHY  06/18/2010   EF =>55%,LV normal   EYE SURGERY     holter monitor  11/08/2005   NM MYOCAR PERF WALL MOTION  05/23/2008   EF 62%  ,norm myocardial perfusion    FAMILY HISTORY: History reviewed. No pertinent family history.  SOCIAL HISTORY: Social History   Socioeconomic History   Marital status: Significant Other    Spouse name: Not on file   Number of children: 4   Years of education: 42   Highest education level: Not on file  Occupational History   Not on file  Tobacco Use   Smoking status: Former    Types: Cigarettes   Smokeless tobacco: Former    Quit date: 10/31/1982  Substance and Sexual Activity   Alcohol use: Yes    Comment: 1 glass wine daily   Drug use: No   Sexual activity: Never  Other Topics Concern   Not on file  Social History Narrative   Not on file   Social Determinants of Health   Financial Resource Strain: Not on file  Food Insecurity: Not on file  Transportation Needs: Not on file  Physical Activity: Not on file  Stress: Not on file  Social Connections: Not on file  Intimate Partner Violence: Not on file      Marcial Pacas, M.D. Ph.D.  [facility] [address], [place], [place] [address] Ph: [phone] Fax: [phone]  CC:  [name], [place],  [place] [address]  Nilda Simmer, NP

## 2022-01-02 ENCOUNTER — Telehealth: Payer: Self-pay | Admitting: Neurology

## 2022-01-02 LAB — CBC WITH DIFFERENTIAL/PLATELET
Basophils Absolute: 0.3 10*3/uL — ABNORMAL HIGH (ref 0.0–0.2)
Basos: 2 %
EOS (ABSOLUTE): 0.2 10*3/uL (ref 0.0–0.4)
Eos: 1 %
Hematocrit: 48.2 % (ref 37.5–51.0)
Hemoglobin: 14.8 g/dL (ref 13.0–17.7)
Immature Grans (Abs): 0.1 10*3/uL (ref 0.0–0.1)
Immature Granulocytes: 1 %
Lymphocytes Absolute: 1.6 10*3/uL (ref 0.7–3.1)
Lymphs: 11 %
MCH: 25.6 pg — ABNORMAL LOW (ref 26.6–33.0)
MCHC: 30.7 g/dL — ABNORMAL LOW (ref 31.5–35.7)
MCV: 84 fL (ref 79–97)
Monocytes Absolute: 1.1 10*3/uL — ABNORMAL HIGH (ref 0.1–0.9)
Monocytes: 7 %
Neutrophils Absolute: 11.7 10*3/uL — ABNORMAL HIGH (ref 1.4–7.0)
Neutrophils: 78 %
Platelets: 499 10*3/uL — ABNORMAL HIGH (ref 150–450)
RBC: 5.77 x10E6/uL (ref 4.14–5.80)
RDW: 15 % (ref 11.6–15.4)
WBC: 14.9 10*3/uL — ABNORMAL HIGH (ref 3.4–10.8)

## 2022-01-02 LAB — COMPREHENSIVE METABOLIC PANEL
ALT: 7 IU/L (ref 0–44)
AST: 10 IU/L (ref 0–40)
Albumin/Globulin Ratio: 1 — ABNORMAL LOW (ref 1.2–2.2)
Albumin: 4 g/dL (ref 3.7–4.7)
Alkaline Phosphatase: 85 IU/L (ref 44–121)
BUN/Creatinine Ratio: 13 (ref 10–24)
BUN: 17 mg/dL (ref 8–27)
Bilirubin Total: 0.6 mg/dL (ref 0.0–1.2)
CO2: 21 mmol/L (ref 20–29)
Calcium: 9.6 mg/dL (ref 8.6–10.2)
Chloride: 94 mmol/L — ABNORMAL LOW (ref 96–106)
Creatinine, Ser: 1.32 mg/dL — ABNORMAL HIGH (ref 0.76–1.27)
Globulin, Total: 4.1 g/dL (ref 1.5–4.5)
Glucose: 274 mg/dL — ABNORMAL HIGH (ref 70–99)
Potassium: 5 mmol/L (ref 3.5–5.2)
Sodium: 131 mmol/L — ABNORMAL LOW (ref 134–144)
Total Protein: 8.1 g/dL (ref 6.0–8.5)
eGFR: 52 mL/min/{1.73_m2} — ABNORMAL LOW (ref >59)

## 2022-01-02 LAB — RPR: RPR Ser Ql: NONREACTIVE

## 2022-01-02 LAB — TSH RFX ON ABNORMAL TO FREE T4: TSH: 3.47 u[IU]/mL (ref 0.450–4.500)

## 2022-01-02 LAB — VITAMIN B12: Vitamin B-12: 1771 pg/mL — ABNORMAL HIGH (ref 232–1245)

## 2022-01-02 NOTE — Telephone Encounter (Signed)
Please call patient, laboratory evaluation showed elevated WBC, 14.9 with elevated absolute neutrophil, above findings could indicate active infection, please check to see if patient has any signs of upper respiratory, or UTI, fever, etc.  There is also evidence of mild elevation of creatinine 1.32, low sodium 131, chloride 94,  I have forwarded laboratory evaluation to his primary care Nilda Simmer, NP, he should contact her to make sure there is no underlying infection, potentially repeat laboratory evaluation to rule out electrolyte imbalance.

## 2022-01-02 NOTE — Telephone Encounter (Signed)
I called the pt back updated on results. Pt verbalized understanding and appreciation for the call.

## 2022-01-21 DIAGNOSIS — R52 Pain, unspecified: Secondary | ICD-10-CM | POA: Diagnosis not present

## 2022-01-21 DIAGNOSIS — G20C Parkinsonism, unspecified: Secondary | ICD-10-CM | POA: Diagnosis not present

## 2022-01-21 DIAGNOSIS — I10 Essential (primary) hypertension: Secondary | ICD-10-CM | POA: Diagnosis not present

## 2022-01-21 DIAGNOSIS — Z6826 Body mass index (BMI) 26.0-26.9, adult: Secondary | ICD-10-CM | POA: Diagnosis not present

## 2022-01-21 DIAGNOSIS — D75839 Thrombocytosis, unspecified: Secondary | ICD-10-CM | POA: Diagnosis not present

## 2022-01-21 DIAGNOSIS — R69 Illness, unspecified: Secondary | ICD-10-CM | POA: Diagnosis not present

## 2022-01-21 DIAGNOSIS — K219 Gastro-esophageal reflux disease without esophagitis: Secondary | ICD-10-CM | POA: Diagnosis not present

## 2022-01-21 DIAGNOSIS — L84 Corns and callosities: Secondary | ICD-10-CM | POA: Diagnosis not present

## 2022-01-22 DIAGNOSIS — R2689 Other abnormalities of gait and mobility: Secondary | ICD-10-CM | POA: Diagnosis not present

## 2022-01-22 DIAGNOSIS — G9009 Other idiopathic peripheral autonomic neuropathy: Secondary | ICD-10-CM | POA: Diagnosis not present

## 2022-01-23 DIAGNOSIS — E559 Vitamin D deficiency, unspecified: Secondary | ICD-10-CM | POA: Diagnosis not present

## 2022-01-23 DIAGNOSIS — E119 Type 2 diabetes mellitus without complications: Secondary | ICD-10-CM | POA: Diagnosis not present

## 2022-01-23 DIAGNOSIS — I10 Essential (primary) hypertension: Secondary | ICD-10-CM | POA: Diagnosis not present

## 2022-01-24 DIAGNOSIS — D75839 Thrombocytosis, unspecified: Secondary | ICD-10-CM | POA: Diagnosis not present

## 2022-01-24 DIAGNOSIS — E1122 Type 2 diabetes mellitus with diabetic chronic kidney disease: Secondary | ICD-10-CM | POA: Diagnosis not present

## 2022-01-24 DIAGNOSIS — E559 Vitamin D deficiency, unspecified: Secondary | ICD-10-CM | POA: Diagnosis not present

## 2022-01-28 DIAGNOSIS — R69 Illness, unspecified: Secondary | ICD-10-CM | POA: Diagnosis not present

## 2022-01-28 DIAGNOSIS — F3341 Major depressive disorder, recurrent, in partial remission: Secondary | ICD-10-CM | POA: Diagnosis not present

## 2022-01-30 ENCOUNTER — Encounter: Payer: Self-pay | Admitting: Internal Medicine

## 2022-01-30 DIAGNOSIS — I2510 Atherosclerotic heart disease of native coronary artery without angina pectoris: Secondary | ICD-10-CM | POA: Diagnosis not present

## 2022-01-30 DIAGNOSIS — E1122 Type 2 diabetes mellitus with diabetic chronic kidney disease: Secondary | ICD-10-CM | POA: Diagnosis not present

## 2022-01-30 DIAGNOSIS — L84 Corns and callosities: Secondary | ICD-10-CM | POA: Diagnosis not present

## 2022-01-30 DIAGNOSIS — L97418 Non-pressure chronic ulcer of right heel and midfoot with other specified severity: Secondary | ICD-10-CM | POA: Diagnosis not present

## 2022-02-05 DIAGNOSIS — L97418 Non-pressure chronic ulcer of right heel and midfoot with other specified severity: Secondary | ICD-10-CM | POA: Diagnosis not present

## 2022-02-05 DIAGNOSIS — E1122 Type 2 diabetes mellitus with diabetic chronic kidney disease: Secondary | ICD-10-CM | POA: Diagnosis not present

## 2022-02-05 DIAGNOSIS — I2510 Atherosclerotic heart disease of native coronary artery without angina pectoris: Secondary | ICD-10-CM | POA: Diagnosis not present

## 2022-02-05 DIAGNOSIS — L84 Corns and callosities: Secondary | ICD-10-CM | POA: Diagnosis not present

## 2022-02-18 DIAGNOSIS — R69 Illness, unspecified: Secondary | ICD-10-CM | POA: Diagnosis not present

## 2022-02-18 DIAGNOSIS — F330 Major depressive disorder, recurrent, mild: Secondary | ICD-10-CM | POA: Diagnosis not present

## 2022-02-19 ENCOUNTER — Encounter: Payer: Self-pay | Admitting: Cardiovascular Disease

## 2022-02-19 ENCOUNTER — Ambulatory Visit: Payer: Medicare HMO | Attending: Cardiovascular Disease | Admitting: Cardiovascular Disease

## 2022-02-19 VITALS — BP 108/54 | HR 72 | Ht 72.0 in

## 2022-02-19 DIAGNOSIS — Z8679 Personal history of other diseases of the circulatory system: Secondary | ICD-10-CM

## 2022-02-19 DIAGNOSIS — E785 Hyperlipidemia, unspecified: Secondary | ICD-10-CM

## 2022-02-19 DIAGNOSIS — I10 Essential (primary) hypertension: Secondary | ICD-10-CM | POA: Diagnosis not present

## 2022-02-19 NOTE — Assessment & Plan Note (Signed)
History of essential hypertension a blood pressure measured today at 108/54.  He is on metoprolol.

## 2022-02-19 NOTE — Assessment & Plan Note (Signed)
History of hyperlipidemia not on statin therapy.  His last lipid profile was in 2018.  At this point, I do not feel inclined to continue to follow this.

## 2022-02-19 NOTE — Progress Notes (Signed)
02/19/2022 [name]   [birth year]  [id number]  Primary Physician Garwin Brothers, MD Primary Cardiologist: Lorretta Harp MD Garret Reddish, [place], [place]  HPI:  [name] is a 89 y.o.  moderately overweight widowed, but remarried, Caucasian male (wife died 2010/09/15 at age 29), father of 42, grandfather to 6 grandchildren who I last saw in the office 02/21/2021.  He is accompanied by one of his sons today named [name].  He has a history of CAD status post LAD and RCA stenting in the past. He was last catheterized by Dr. Melvern Banker in 2007 and found to have patent stents with normal LV function. His other problems include hypertension, hyperlipidemia and diabetes. He does not smoke or drink.Since I saw him 11/26/13 he has been relatively asymptomatic. He had a Myoview stress test and 2-D echo performed in October 2014 year which were normal. He complains of erectile dysfunction.   Since I saw him 6 months ago he is done well .  He does walk with a walker for balance.  He says that his knees are weak.  He also has a slowly healing wound on his right foot that is being treated by Dr. Blenda Mounts at [facility].  He has a 46  mm ascending thoracic aortic aneurysms recently seen on 2D echocardiogram performed 06/14/2021 which I decided to stop following given his age.  He denies chest pain or shortness of breath.   Current Meds  Medication Sig   ASPIRIN 81 PO Take 81 mg by mouth daily.   carbidopa-levodopa (SINEMET IR) 25-100 MG tablet Take 2 tablets by mouth 3 (three) times daily. At 8am, noon, 5pm   hydroxyurea (HYDREA) 500 MG capsule TAKE 1 CAPSULE BY MOUTH ONCE DAILY WITH FOOD   losartan (COZAAR) 100 MG tablet Take 100 mg by mouth daily.   metoprolol tartrate (LOPRESSOR) 50 MG tablet Take 1 tablet by mouth twice daily   Multiple Vitamin (MULTIVITAMIN WITH MINERALS) TABS tablet Take 1 tablet by mouth every morning.   omeprazole (PRILOSEC) 40 MG capsule Take 40 mg by mouth daily.   Podiatric  Products (AMLACTIN FOOT REPAIR) 15 % CREA Apply topically.     No Known Allergies  Social History   Socioeconomic History   Marital status: Significant Other    Spouse name: Not on file   Number of children: 4   Years of education: 69   Highest education level: Not on file  Occupational History   Not on file  Tobacco Use   Smoking status: Former    Types: Cigarettes   Smokeless tobacco: Former    Quit date: 10/31/1982  Substance and Sexual Activity   Alcohol use: Yes    Comment: 1 glass wine daily   Drug use: No   Sexual activity: Never  Other Topics Concern   Not on file  Social History Narrative   Not on file   Social Determinants of Health   Financial Resource Strain: Not on file  Food Insecurity: Not on file  Transportation Needs: Not on file  Physical Activity: Not on file  Stress: Not on file  Social Connections: Not on file  Intimate Partner Violence: Not on file     Review of Systems: General: negative for chills, fever, night sweats or weight changes.  Cardiovascular: negative for chest pain, dyspnea on exertion, edema, orthopnea, palpitations, paroxysmal nocturnal dyspnea or shortness of breath Dermatological: negative for rash Respiratory: negative for cough or wheezing Urologic: negative for  hematuria Abdominal: negative for nausea, vomiting, diarrhea, bright red blood per rectum, melena, or hematemesis Neurologic: negative for visual changes, syncope, or dizziness All other systems reviewed and are otherwise negative except as noted above.    Blood pressure (!) 108/54, pulse 72, height 6' (1.829 m).  General appearance: alert and no distress Neck: no adenopathy, no carotid bruit, no JVD, supple, symmetrical, trachea midline, and thyroid not enlarged, symmetric, no tenderness/mass/nodules Lungs: clear to auscultation bilaterally Heart: regular rate and rhythm, S1, S2 normal, no murmur, click, rub or gallop Extremities: extremities normal,  atraumatic, no cyanosis or edema Pulses: 2+ and symmetric Skin: Skin color, texture, turgor normal. No rashes or lesions Neurologic: Grossly normal  EKG sinus rhythm at 72 with septal Q waves and inferior Q waves, nonspecific ST and T wave changes.  This EKG.  ASSESSMENT AND PLAN:   HYPERLIPIDEMIA History of hyperlipidemia not on statin therapy.  His last lipid profile was in 2018.  At this point, I do not feel inclined to continue to follow this.  Essential hypertension History of essential hypertension a blood pressure measured today at 108/54.  He is on metoprolol.  Coronary atherosclerosis History of CAD status post remote LAD and RCA stenting.  His last catheterization performed by Dr. Melvern Banker  in 2007 revealed patent stents with normal LV function.  He had a Myoview and echo performed October 2014 which were normal.  He denies any symptoms of chest pain or shortness of breath.     Lorretta Harp MD FACP,FACC,FAHA, [facility] 02/19/2022 4:02 PM

## 2022-02-19 NOTE — Assessment & Plan Note (Signed)
History of CAD status post remote LAD and RCA stenting.  His last catheterization performed by Dr. Melvern Banker  in 2007 revealed patent stents with normal LV function.  He had a Myoview and echo performed October 2014 which were normal.  He denies any symptoms of chest pain or shortness of breath.

## 2022-02-19 NOTE — Patient Instructions (Signed)
Medication Instructions:  Your physician recommends that you continue on your current medications as directed. Please refer to the Current Medication list given to you today.  *If you need a refill on your cardiac medications before your next appointment, please call your pharmacy*   Follow-Up: At [facility], you and your health needs are our priority.  As part of our continuing mission to provide you with exceptional heart care, we have created designated Provider Care Teams.  These Care Teams include your primary Cardiologist (physician) and Advanced Practice Providers (APPs -  Physician Assistants and Nurse Practitioners) who all work together to provide you with the care you need, when you need it.  We recommend signing up for the patient portal called "MyChart".  Sign up information is provided on this After Visit Summary.  MyChart is used to connect with patients for Virtual Visits (Telemedicine).  Patients are able to view lab/test results, encounter notes, upcoming appointments, etc.  Non-urgent messages can be sent to your provider as well.   To learn more about what you can do with MyChart, go to [email or web address].    Your next appointment:   We will see you on an as needed basis.  Provider:   Quay Burow, MD

## 2022-02-20 DIAGNOSIS — R2689 Other abnormalities of gait and mobility: Secondary | ICD-10-CM | POA: Diagnosis not present

## 2022-02-20 DIAGNOSIS — G9009 Other idiopathic peripheral autonomic neuropathy: Secondary | ICD-10-CM | POA: Diagnosis not present

## 2022-02-20 DIAGNOSIS — L97418 Non-pressure chronic ulcer of right heel and midfoot with other specified severity: Secondary | ICD-10-CM | POA: Diagnosis not present

## 2022-02-20 DIAGNOSIS — M6281 Muscle weakness (generalized): Secondary | ICD-10-CM | POA: Diagnosis not present

## 2022-02-23 DIAGNOSIS — M6281 Muscle weakness (generalized): Secondary | ICD-10-CM | POA: Diagnosis not present

## 2022-02-23 DIAGNOSIS — R2689 Other abnormalities of gait and mobility: Secondary | ICD-10-CM | POA: Diagnosis not present

## 2022-02-23 DIAGNOSIS — G9009 Other idiopathic peripheral autonomic neuropathy: Secondary | ICD-10-CM | POA: Diagnosis not present

## 2022-02-25 DIAGNOSIS — S81801A Unspecified open wound, right lower leg, initial encounter: Secondary | ICD-10-CM | POA: Diagnosis not present

## 2022-02-26 DIAGNOSIS — E11621 Type 2 diabetes mellitus with foot ulcer: Secondary | ICD-10-CM | POA: Diagnosis not present

## 2022-02-27 DIAGNOSIS — I2510 Atherosclerotic heart disease of native coronary artery without angina pectoris: Secondary | ICD-10-CM | POA: Diagnosis not present

## 2022-02-27 DIAGNOSIS — L84 Corns and callosities: Secondary | ICD-10-CM | POA: Diagnosis not present

## 2022-02-27 DIAGNOSIS — E1122 Type 2 diabetes mellitus with diabetic chronic kidney disease: Secondary | ICD-10-CM | POA: Diagnosis not present

## 2022-02-27 DIAGNOSIS — L97418 Non-pressure chronic ulcer of right heel and midfoot with other specified severity: Secondary | ICD-10-CM | POA: Diagnosis not present

## 2022-02-28 DIAGNOSIS — L853 Xerosis cutis: Secondary | ICD-10-CM

## 2022-02-28 DIAGNOSIS — M79672 Pain in left foot: Secondary | ICD-10-CM

## 2022-03-02 DIAGNOSIS — M6281 Muscle weakness (generalized): Secondary | ICD-10-CM | POA: Diagnosis not present

## 2022-03-02 DIAGNOSIS — G9009 Other idiopathic peripheral autonomic neuropathy: Secondary | ICD-10-CM | POA: Diagnosis not present

## 2022-03-02 DIAGNOSIS — R2689 Other abnormalities of gait and mobility: Secondary | ICD-10-CM | POA: Diagnosis not present

## 2022-03-04 DIAGNOSIS — H35013 Changes in retinal vascular appearance, bilateral: Secondary | ICD-10-CM | POA: Diagnosis not present

## 2022-03-04 DIAGNOSIS — E119 Type 2 diabetes mellitus without complications: Secondary | ICD-10-CM | POA: Diagnosis not present

## 2022-03-05 DIAGNOSIS — E1122 Type 2 diabetes mellitus with diabetic chronic kidney disease: Secondary | ICD-10-CM | POA: Diagnosis not present

## 2022-03-05 DIAGNOSIS — L84 Corns and callosities: Secondary | ICD-10-CM | POA: Diagnosis not present

## 2022-03-05 DIAGNOSIS — L97418 Non-pressure chronic ulcer of right heel and midfoot with other specified severity: Secondary | ICD-10-CM | POA: Diagnosis not present

## 2022-03-05 DIAGNOSIS — I2510 Atherosclerotic heart disease of native coronary artery without angina pectoris: Secondary | ICD-10-CM | POA: Diagnosis not present

## 2022-03-08 DIAGNOSIS — M6281 Muscle weakness (generalized): Secondary | ICD-10-CM | POA: Diagnosis not present

## 2022-03-08 DIAGNOSIS — G9009 Other idiopathic peripheral autonomic neuropathy: Secondary | ICD-10-CM | POA: Diagnosis not present

## 2022-03-08 DIAGNOSIS — R2689 Other abnormalities of gait and mobility: Secondary | ICD-10-CM | POA: Diagnosis not present

## 2022-03-12 DIAGNOSIS — G9009 Other idiopathic peripheral autonomic neuropathy: Secondary | ICD-10-CM | POA: Diagnosis not present

## 2022-03-12 DIAGNOSIS — R69 Illness, unspecified: Secondary | ICD-10-CM | POA: Diagnosis not present

## 2022-03-12 DIAGNOSIS — M6281 Muscle weakness (generalized): Secondary | ICD-10-CM | POA: Diagnosis not present

## 2022-03-12 DIAGNOSIS — E1122 Type 2 diabetes mellitus with diabetic chronic kidney disease: Secondary | ICD-10-CM | POA: Diagnosis not present

## 2022-03-12 DIAGNOSIS — R2689 Other abnormalities of gait and mobility: Secondary | ICD-10-CM | POA: Diagnosis not present

## 2022-03-12 DIAGNOSIS — L97418 Non-pressure chronic ulcer of right heel and midfoot with other specified severity: Secondary | ICD-10-CM | POA: Diagnosis not present

## 2022-03-12 DIAGNOSIS — L84 Corns and callosities: Secondary | ICD-10-CM | POA: Diagnosis not present

## 2022-03-13 DIAGNOSIS — G9009 Other idiopathic peripheral autonomic neuropathy: Secondary | ICD-10-CM | POA: Diagnosis not present

## 2022-03-13 DIAGNOSIS — R2689 Other abnormalities of gait and mobility: Secondary | ICD-10-CM | POA: Diagnosis not present

## 2022-03-13 DIAGNOSIS — M6281 Muscle weakness (generalized): Secondary | ICD-10-CM | POA: Diagnosis not present

## 2022-03-15 ENCOUNTER — Telehealth: Payer: Self-pay | Admitting: Internal Medicine

## 2022-03-15 DIAGNOSIS — M6281 Muscle weakness (generalized): Secondary | ICD-10-CM | POA: Diagnosis not present

## 2022-03-15 DIAGNOSIS — G9009 Other idiopathic peripheral autonomic neuropathy: Secondary | ICD-10-CM | POA: Diagnosis not present

## 2022-03-15 DIAGNOSIS — R2689 Other abnormalities of gait and mobility: Secondary | ICD-10-CM | POA: Diagnosis not present

## 2022-03-15 NOTE — Telephone Encounter (Signed)
Cancelled 03/14 appointments per 03/08 scheduling message. Appointment cancelled, called to reschedule.

## 2022-03-20 DIAGNOSIS — B351 Tinea unguium: Secondary | ICD-10-CM | POA: Diagnosis not present

## 2022-03-20 DIAGNOSIS — G9009 Other idiopathic peripheral autonomic neuropathy: Secondary | ICD-10-CM | POA: Diagnosis not present

## 2022-03-20 DIAGNOSIS — K219 Gastro-esophageal reflux disease without esophagitis: Secondary | ICD-10-CM | POA: Diagnosis not present

## 2022-03-20 DIAGNOSIS — L84 Corns and callosities: Secondary | ICD-10-CM | POA: Diagnosis not present

## 2022-03-20 DIAGNOSIS — Z7984 Long term (current) use of oral hypoglycemic drugs: Secondary | ICD-10-CM | POA: Diagnosis not present

## 2022-03-20 DIAGNOSIS — I739 Peripheral vascular disease, unspecified: Secondary | ICD-10-CM | POA: Diagnosis not present

## 2022-03-20 DIAGNOSIS — E1151 Type 2 diabetes mellitus with diabetic peripheral angiopathy without gangrene: Secondary | ICD-10-CM | POA: Diagnosis not present

## 2022-03-20 DIAGNOSIS — M6281 Muscle weakness (generalized): Secondary | ICD-10-CM | POA: Diagnosis not present

## 2022-03-20 DIAGNOSIS — R1310 Dysphagia, unspecified: Secondary | ICD-10-CM | POA: Diagnosis not present

## 2022-03-20 DIAGNOSIS — R2689 Other abnormalities of gait and mobility: Secondary | ICD-10-CM | POA: Diagnosis not present

## 2022-03-20 DIAGNOSIS — K222 Esophageal obstruction: Secondary | ICD-10-CM | POA: Diagnosis not present

## 2022-03-21 ENCOUNTER — Inpatient Hospital Stay: Payer: Medicare HMO | Admitting: Internal Medicine

## 2022-03-21 ENCOUNTER — Inpatient Hospital Stay: Payer: Medicare HMO

## 2022-03-21 DIAGNOSIS — D75839 Thrombocytosis, unspecified: Secondary | ICD-10-CM | POA: Diagnosis not present

## 2022-03-21 DIAGNOSIS — E1122 Type 2 diabetes mellitus with diabetic chronic kidney disease: Secondary | ICD-10-CM | POA: Diagnosis not present

## 2022-03-21 DIAGNOSIS — L97418 Non-pressure chronic ulcer of right heel and midfoot with other specified severity: Secondary | ICD-10-CM | POA: Diagnosis not present

## 2022-03-21 DIAGNOSIS — L84 Corns and callosities: Secondary | ICD-10-CM | POA: Diagnosis not present

## 2022-03-21 DIAGNOSIS — R2689 Other abnormalities of gait and mobility: Secondary | ICD-10-CM | POA: Diagnosis not present

## 2022-03-21 DIAGNOSIS — G9009 Other idiopathic peripheral autonomic neuropathy: Secondary | ICD-10-CM | POA: Diagnosis not present

## 2022-03-21 DIAGNOSIS — M6281 Muscle weakness (generalized): Secondary | ICD-10-CM | POA: Diagnosis not present

## 2022-03-25 DIAGNOSIS — L97418 Non-pressure chronic ulcer of right heel and midfoot with other specified severity: Secondary | ICD-10-CM | POA: Diagnosis not present

## 2022-03-25 DIAGNOSIS — D75839 Thrombocytosis, unspecified: Secondary | ICD-10-CM | POA: Diagnosis not present

## 2022-03-25 DIAGNOSIS — E1122 Type 2 diabetes mellitus with diabetic chronic kidney disease: Secondary | ICD-10-CM | POA: Diagnosis not present

## 2022-03-25 DIAGNOSIS — L84 Corns and callosities: Secondary | ICD-10-CM | POA: Diagnosis not present

## 2022-03-29 DIAGNOSIS — M6281 Muscle weakness (generalized): Secondary | ICD-10-CM | POA: Diagnosis not present

## 2022-03-29 DIAGNOSIS — R2689 Other abnormalities of gait and mobility: Secondary | ICD-10-CM | POA: Diagnosis not present

## 2022-03-29 DIAGNOSIS — G9009 Other idiopathic peripheral autonomic neuropathy: Secondary | ICD-10-CM | POA: Diagnosis not present

## 2022-04-01 DIAGNOSIS — X32XXXD Exposure to sunlight, subsequent encounter: Secondary | ICD-10-CM | POA: Diagnosis not present

## 2022-04-01 DIAGNOSIS — L570 Actinic keratosis: Secondary | ICD-10-CM | POA: Diagnosis not present

## 2022-04-01 DIAGNOSIS — D0471 Carcinoma in situ of skin of right lower limb, including hip: Secondary | ICD-10-CM | POA: Diagnosis not present

## 2022-04-02 DIAGNOSIS — R2689 Other abnormalities of gait and mobility: Secondary | ICD-10-CM | POA: Diagnosis not present

## 2022-04-02 DIAGNOSIS — G9009 Other idiopathic peripheral autonomic neuropathy: Secondary | ICD-10-CM | POA: Diagnosis not present

## 2022-04-02 DIAGNOSIS — M6281 Muscle weakness (generalized): Secondary | ICD-10-CM | POA: Diagnosis not present

## 2022-04-03 DIAGNOSIS — G9009 Other idiopathic peripheral autonomic neuropathy: Secondary | ICD-10-CM | POA: Diagnosis not present

## 2022-04-03 DIAGNOSIS — M6281 Muscle weakness (generalized): Secondary | ICD-10-CM | POA: Diagnosis not present

## 2022-04-03 DIAGNOSIS — R2689 Other abnormalities of gait and mobility: Secondary | ICD-10-CM | POA: Diagnosis not present

## 2022-04-04 DIAGNOSIS — L84 Corns and callosities: Secondary | ICD-10-CM | POA: Diagnosis not present

## 2022-04-04 DIAGNOSIS — D75839 Thrombocytosis, unspecified: Secondary | ICD-10-CM | POA: Diagnosis not present

## 2022-04-04 DIAGNOSIS — L97418 Non-pressure chronic ulcer of right heel and midfoot with other specified severity: Secondary | ICD-10-CM | POA: Diagnosis not present

## 2022-04-04 DIAGNOSIS — E1122 Type 2 diabetes mellitus with diabetic chronic kidney disease: Secondary | ICD-10-CM | POA: Diagnosis not present

## 2022-04-05 ENCOUNTER — Telehealth: Payer: Self-pay | Admitting: *Deleted

## 2022-04-05 DIAGNOSIS — R2689 Other abnormalities of gait and mobility: Secondary | ICD-10-CM | POA: Diagnosis not present

## 2022-04-05 DIAGNOSIS — M6281 Muscle weakness (generalized): Secondary | ICD-10-CM | POA: Diagnosis not present

## 2022-04-05 DIAGNOSIS — G9009 Other idiopathic peripheral autonomic neuropathy: Secondary | ICD-10-CM | POA: Diagnosis not present

## 2022-04-05 NOTE — Telephone Encounter (Signed)
   Pre-operative Risk Assessment    Patient Name: [name]  DOB: April 17, 1932 MRN: [id number]      Request for Surgical Clearance    Procedure:   EGD  Date of Surgery:  Clearance 04/11/22                                 Surgeon:  DR. HUNG Surgeon's Group or Practice Name:  [facility] [place] Phone number:  [phone] Fax number:  [phone]   Type of Clearance Requested:   - Medical ; ASA   Type of Anesthesia:   PROPOFOL   Additional requests/questions:    Jiles Prows   04/05/2022, 4:09 PM

## 2022-04-05 NOTE — Telephone Encounter (Signed)
   Name: [name]  DOB: 1932-12-17  MRN: [id number]   Primary Cardiologist: Quay Burow, MD  Chart reviewed as part of pre-operative protocol coverage. Patient was contacted 04/05/2022 in reference to pre-operative risk assessment for pending surgery as outlined below.  Jacorey A Kick was last seen on 02/19/2022 by Dr. Gwenlyn Found.  Since that day, Hugo A Maves has done well from a cardiac standpoint.  He is unable to complete 4 METS at baseline, however, he has been stable no new symptoms or concerns and was cleared for endoscopy procedure previously.  He was also previously cleared to hold aspirin.  Therefore, based on ACC/AHA guidelines, the patient would be at acceptable risk for the planned procedure without further cardiovascular testing.   Per office protocol, he may hold Aspirin for 5-7 days prior to procedure. Please resume Aspirin as soon as possible postprocedure, at the discretion of the surgeon.    The patient was advised that if he develops new symptoms prior to surgery to contact our office to arrange for a follow-up visit, and he verbalized understanding.  I will route this recommendation to the requesting party via Epic fax function and remove from pre-op pool. Please call with questions.  Lenna Sciara, NP 04/05/2022, 4:18 PM

## 2022-04-08 DIAGNOSIS — E1122 Type 2 diabetes mellitus with diabetic chronic kidney disease: Secondary | ICD-10-CM | POA: Diagnosis not present

## 2022-04-08 DIAGNOSIS — D75839 Thrombocytosis, unspecified: Secondary | ICD-10-CM | POA: Diagnosis not present

## 2022-04-08 DIAGNOSIS — L84 Corns and callosities: Secondary | ICD-10-CM | POA: Diagnosis not present

## 2022-04-08 DIAGNOSIS — L97418 Non-pressure chronic ulcer of right heel and midfoot with other specified severity: Secondary | ICD-10-CM | POA: Diagnosis not present

## 2022-04-10 DIAGNOSIS — G9009 Other idiopathic peripheral autonomic neuropathy: Secondary | ICD-10-CM | POA: Diagnosis not present

## 2022-04-10 DIAGNOSIS — R2689 Other abnormalities of gait and mobility: Secondary | ICD-10-CM | POA: Diagnosis not present

## 2022-04-11 DIAGNOSIS — K2289 Other specified disease of esophagus: Secondary | ICD-10-CM | POA: Diagnosis not present

## 2022-04-11 DIAGNOSIS — D75839 Thrombocytosis, unspecified: Secondary | ICD-10-CM | POA: Diagnosis not present

## 2022-04-11 DIAGNOSIS — K259 Gastric ulcer, unspecified as acute or chronic, without hemorrhage or perforation: Secondary | ICD-10-CM | POA: Diagnosis not present

## 2022-04-11 DIAGNOSIS — E1122 Type 2 diabetes mellitus with diabetic chronic kidney disease: Secondary | ICD-10-CM | POA: Diagnosis not present

## 2022-04-11 DIAGNOSIS — R1310 Dysphagia, unspecified: Secondary | ICD-10-CM | POA: Diagnosis not present

## 2022-04-11 DIAGNOSIS — L97418 Non-pressure chronic ulcer of right heel and midfoot with other specified severity: Secondary | ICD-10-CM | POA: Diagnosis not present

## 2022-04-11 DIAGNOSIS — K449 Diaphragmatic hernia without obstruction or gangrene: Secondary | ICD-10-CM | POA: Diagnosis not present

## 2022-04-11 DIAGNOSIS — K254 Chronic or unspecified gastric ulcer with hemorrhage: Secondary | ICD-10-CM | POA: Diagnosis not present

## 2022-04-11 DIAGNOSIS — L84 Corns and callosities: Secondary | ICD-10-CM | POA: Diagnosis not present

## 2022-04-11 DIAGNOSIS — K222 Esophageal obstruction: Secondary | ICD-10-CM | POA: Diagnosis not present

## 2022-04-12 DIAGNOSIS — R2689 Other abnormalities of gait and mobility: Secondary | ICD-10-CM | POA: Diagnosis not present

## 2022-04-12 DIAGNOSIS — G9009 Other idiopathic peripheral autonomic neuropathy: Secondary | ICD-10-CM | POA: Diagnosis not present

## 2022-04-17 DIAGNOSIS — R2689 Other abnormalities of gait and mobility: Secondary | ICD-10-CM | POA: Diagnosis not present

## 2022-04-17 DIAGNOSIS — G9009 Other idiopathic peripheral autonomic neuropathy: Secondary | ICD-10-CM | POA: Diagnosis not present

## 2022-04-18 DIAGNOSIS — L97418 Non-pressure chronic ulcer of right heel and midfoot with other specified severity: Secondary | ICD-10-CM | POA: Diagnosis not present

## 2022-04-18 DIAGNOSIS — D75839 Thrombocytosis, unspecified: Secondary | ICD-10-CM | POA: Diagnosis not present

## 2022-04-18 DIAGNOSIS — E1122 Type 2 diabetes mellitus with diabetic chronic kidney disease: Secondary | ICD-10-CM | POA: Diagnosis not present

## 2022-04-18 DIAGNOSIS — L84 Corns and callosities: Secondary | ICD-10-CM | POA: Diagnosis not present

## 2022-04-25 DIAGNOSIS — L97418 Non-pressure chronic ulcer of right heel and midfoot with other specified severity: Secondary | ICD-10-CM | POA: Diagnosis not present

## 2022-04-25 DIAGNOSIS — L84 Corns and callosities: Secondary | ICD-10-CM | POA: Diagnosis not present

## 2022-04-25 DIAGNOSIS — E1122 Type 2 diabetes mellitus with diabetic chronic kidney disease: Secondary | ICD-10-CM | POA: Diagnosis not present

## 2022-04-25 DIAGNOSIS — D75839 Thrombocytosis, unspecified: Secondary | ICD-10-CM | POA: Diagnosis not present

## 2022-05-02 DIAGNOSIS — L97418 Non-pressure chronic ulcer of right heel and midfoot with other specified severity: Secondary | ICD-10-CM | POA: Diagnosis not present

## 2022-05-02 DIAGNOSIS — L84 Corns and callosities: Secondary | ICD-10-CM | POA: Diagnosis not present

## 2022-05-02 DIAGNOSIS — E1122 Type 2 diabetes mellitus with diabetic chronic kidney disease: Secondary | ICD-10-CM | POA: Diagnosis not present

## 2022-05-02 DIAGNOSIS — D75839 Thrombocytosis, unspecified: Secondary | ICD-10-CM | POA: Diagnosis not present

## 2022-05-09 DIAGNOSIS — L97418 Non-pressure chronic ulcer of right heel and midfoot with other specified severity: Secondary | ICD-10-CM | POA: Diagnosis not present

## 2022-05-09 DIAGNOSIS — L84 Corns and callosities: Secondary | ICD-10-CM | POA: Diagnosis not present

## 2022-05-09 DIAGNOSIS — E1122 Type 2 diabetes mellitus with diabetic chronic kidney disease: Secondary | ICD-10-CM | POA: Diagnosis not present

## 2022-05-09 DIAGNOSIS — D75839 Thrombocytosis, unspecified: Secondary | ICD-10-CM | POA: Diagnosis not present

## 2022-05-16 DIAGNOSIS — L97418 Non-pressure chronic ulcer of right heel and midfoot with other specified severity: Secondary | ICD-10-CM | POA: Diagnosis not present

## 2022-05-16 DIAGNOSIS — D75839 Thrombocytosis, unspecified: Secondary | ICD-10-CM | POA: Diagnosis not present

## 2022-05-16 DIAGNOSIS — E1122 Type 2 diabetes mellitus with diabetic chronic kidney disease: Secondary | ICD-10-CM | POA: Diagnosis not present

## 2022-05-16 DIAGNOSIS — L84 Corns and callosities: Secondary | ICD-10-CM | POA: Diagnosis not present

## 2022-05-21 DIAGNOSIS — E1151 Type 2 diabetes mellitus with diabetic peripheral angiopathy without gangrene: Secondary | ICD-10-CM | POA: Diagnosis not present

## 2022-05-21 DIAGNOSIS — L603 Nail dystrophy: Secondary | ICD-10-CM | POA: Diagnosis not present

## 2022-05-23 DIAGNOSIS — E1122 Type 2 diabetes mellitus with diabetic chronic kidney disease: Secondary | ICD-10-CM | POA: Diagnosis not present

## 2022-05-23 DIAGNOSIS — L84 Corns and callosities: Secondary | ICD-10-CM | POA: Diagnosis not present

## 2022-05-23 DIAGNOSIS — D75839 Thrombocytosis, unspecified: Secondary | ICD-10-CM | POA: Diagnosis not present

## 2022-05-23 DIAGNOSIS — L97418 Non-pressure chronic ulcer of right heel and midfoot with other specified severity: Secondary | ICD-10-CM | POA: Diagnosis not present

## 2022-05-27 DIAGNOSIS — K219 Gastro-esophageal reflux disease without esophagitis: Secondary | ICD-10-CM | POA: Diagnosis not present

## 2022-05-27 DIAGNOSIS — R531 Weakness: Secondary | ICD-10-CM | POA: Diagnosis not present

## 2022-05-27 DIAGNOSIS — R1310 Dysphagia, unspecified: Secondary | ICD-10-CM | POA: Diagnosis not present

## 2022-05-29 DIAGNOSIS — E1122 Type 2 diabetes mellitus with diabetic chronic kidney disease: Secondary | ICD-10-CM | POA: Diagnosis not present

## 2022-05-29 DIAGNOSIS — D75839 Thrombocytosis, unspecified: Secondary | ICD-10-CM | POA: Diagnosis not present

## 2022-05-29 DIAGNOSIS — L84 Corns and callosities: Secondary | ICD-10-CM | POA: Diagnosis not present

## 2022-05-29 DIAGNOSIS — L97418 Non-pressure chronic ulcer of right heel and midfoot with other specified severity: Secondary | ICD-10-CM | POA: Diagnosis not present

## 2022-06-05 DIAGNOSIS — D75839 Thrombocytosis, unspecified: Secondary | ICD-10-CM | POA: Diagnosis not present

## 2022-06-05 DIAGNOSIS — L84 Corns and callosities: Secondary | ICD-10-CM | POA: Diagnosis not present

## 2022-06-05 DIAGNOSIS — L97418 Non-pressure chronic ulcer of right heel and midfoot with other specified severity: Secondary | ICD-10-CM | POA: Diagnosis not present

## 2022-06-05 DIAGNOSIS — E1122 Type 2 diabetes mellitus with diabetic chronic kidney disease: Secondary | ICD-10-CM | POA: Diagnosis not present

## 2022-06-12 DIAGNOSIS — L84 Corns and callosities: Secondary | ICD-10-CM | POA: Diagnosis not present

## 2022-06-12 DIAGNOSIS — D75839 Thrombocytosis, unspecified: Secondary | ICD-10-CM | POA: Diagnosis not present

## 2022-06-12 DIAGNOSIS — L97418 Non-pressure chronic ulcer of right heel and midfoot with other specified severity: Secondary | ICD-10-CM | POA: Diagnosis not present

## 2022-06-12 DIAGNOSIS — E1122 Type 2 diabetes mellitus with diabetic chronic kidney disease: Secondary | ICD-10-CM | POA: Diagnosis not present

## 2022-06-19 DIAGNOSIS — L97418 Non-pressure chronic ulcer of right heel and midfoot with other specified severity: Secondary | ICD-10-CM | POA: Diagnosis not present

## 2022-06-19 DIAGNOSIS — D75839 Thrombocytosis, unspecified: Secondary | ICD-10-CM | POA: Diagnosis not present

## 2022-06-19 DIAGNOSIS — E1122 Type 2 diabetes mellitus with diabetic chronic kidney disease: Secondary | ICD-10-CM | POA: Diagnosis not present

## 2022-06-19 DIAGNOSIS — L84 Corns and callosities: Secondary | ICD-10-CM | POA: Diagnosis not present

## 2022-06-26 DIAGNOSIS — S90812A Abrasion, left foot, initial encounter: Secondary | ICD-10-CM | POA: Diagnosis not present

## 2022-06-26 DIAGNOSIS — D75839 Thrombocytosis, unspecified: Secondary | ICD-10-CM | POA: Diagnosis not present

## 2022-06-26 DIAGNOSIS — L84 Corns and callosities: Secondary | ICD-10-CM | POA: Diagnosis not present

## 2022-06-26 DIAGNOSIS — L97418 Non-pressure chronic ulcer of right heel and midfoot with other specified severity: Secondary | ICD-10-CM | POA: Diagnosis not present

## 2022-07-03 DIAGNOSIS — L97418 Non-pressure chronic ulcer of right heel and midfoot with other specified severity: Secondary | ICD-10-CM | POA: Diagnosis not present

## 2022-07-03 DIAGNOSIS — S90812A Abrasion, left foot, initial encounter: Secondary | ICD-10-CM | POA: Diagnosis not present

## 2022-07-03 DIAGNOSIS — D75839 Thrombocytosis, unspecified: Secondary | ICD-10-CM | POA: Diagnosis not present

## 2022-07-03 DIAGNOSIS — L84 Corns and callosities: Secondary | ICD-10-CM | POA: Diagnosis not present

## 2022-07-10 DIAGNOSIS — D75839 Thrombocytosis, unspecified: Secondary | ICD-10-CM | POA: Diagnosis not present

## 2022-07-10 DIAGNOSIS — S90812A Abrasion, left foot, initial encounter: Secondary | ICD-10-CM | POA: Diagnosis not present

## 2022-07-10 DIAGNOSIS — L84 Corns and callosities: Secondary | ICD-10-CM | POA: Diagnosis not present

## 2022-07-10 DIAGNOSIS — L97418 Non-pressure chronic ulcer of right heel and midfoot with other specified severity: Secondary | ICD-10-CM | POA: Diagnosis not present

## 2022-07-17 DIAGNOSIS — D75839 Thrombocytosis, unspecified: Secondary | ICD-10-CM | POA: Diagnosis not present

## 2022-07-17 DIAGNOSIS — S90812A Abrasion, left foot, initial encounter: Secondary | ICD-10-CM | POA: Diagnosis not present

## 2022-07-17 DIAGNOSIS — L84 Corns and callosities: Secondary | ICD-10-CM | POA: Diagnosis not present

## 2022-07-17 DIAGNOSIS — L97418 Non-pressure chronic ulcer of right heel and midfoot with other specified severity: Secondary | ICD-10-CM | POA: Diagnosis not present

## 2022-07-18 ENCOUNTER — Ambulatory Visit: Payer: Medicare HMO | Admitting: Family Medicine

## 2022-07-24 DIAGNOSIS — D75839 Thrombocytosis, unspecified: Secondary | ICD-10-CM | POA: Diagnosis not present

## 2022-07-24 DIAGNOSIS — L84 Corns and callosities: Secondary | ICD-10-CM | POA: Diagnosis not present

## 2022-07-24 DIAGNOSIS — S90812A Abrasion, left foot, initial encounter: Secondary | ICD-10-CM | POA: Diagnosis not present

## 2022-07-24 DIAGNOSIS — L97418 Non-pressure chronic ulcer of right heel and midfoot with other specified severity: Secondary | ICD-10-CM | POA: Diagnosis not present

## 2022-07-31 DIAGNOSIS — L84 Corns and callosities: Secondary | ICD-10-CM | POA: Diagnosis not present

## 2022-07-31 DIAGNOSIS — D75839 Thrombocytosis, unspecified: Secondary | ICD-10-CM | POA: Diagnosis not present

## 2022-07-31 DIAGNOSIS — S90812A Abrasion, left foot, initial encounter: Secondary | ICD-10-CM | POA: Diagnosis not present

## 2022-07-31 DIAGNOSIS — L97418 Non-pressure chronic ulcer of right heel and midfoot with other specified severity: Secondary | ICD-10-CM | POA: Diagnosis not present

## 2022-08-07 DIAGNOSIS — L97418 Non-pressure chronic ulcer of right heel and midfoot with other specified severity: Secondary | ICD-10-CM | POA: Diagnosis not present

## 2022-08-07 DIAGNOSIS — S90812A Abrasion, left foot, initial encounter: Secondary | ICD-10-CM | POA: Diagnosis not present

## 2022-08-07 DIAGNOSIS — L84 Corns and callosities: Secondary | ICD-10-CM | POA: Diagnosis not present

## 2022-08-07 DIAGNOSIS — D75839 Thrombocytosis, unspecified: Secondary | ICD-10-CM | POA: Diagnosis not present

## 2022-08-08 DIAGNOSIS — R4182 Altered mental status, unspecified: Secondary | ICD-10-CM | POA: Diagnosis not present

## 2022-08-14 DIAGNOSIS — D75839 Thrombocytosis, unspecified: Secondary | ICD-10-CM | POA: Diagnosis not present

## 2022-08-14 DIAGNOSIS — L84 Corns and callosities: Secondary | ICD-10-CM | POA: Diagnosis not present

## 2022-08-14 DIAGNOSIS — L97418 Non-pressure chronic ulcer of right heel and midfoot with other specified severity: Secondary | ICD-10-CM | POA: Diagnosis not present

## 2022-08-14 DIAGNOSIS — S90812A Abrasion, left foot, initial encounter: Secondary | ICD-10-CM | POA: Diagnosis not present

## 2022-08-21 DIAGNOSIS — E1122 Type 2 diabetes mellitus with diabetic chronic kidney disease: Secondary | ICD-10-CM | POA: Diagnosis not present

## 2022-08-22 DIAGNOSIS — D75839 Thrombocytosis, unspecified: Secondary | ICD-10-CM | POA: Diagnosis not present

## 2022-08-22 DIAGNOSIS — L84 Corns and callosities: Secondary | ICD-10-CM | POA: Diagnosis not present

## 2022-08-22 DIAGNOSIS — L97418 Non-pressure chronic ulcer of right heel and midfoot with other specified severity: Secondary | ICD-10-CM | POA: Diagnosis not present

## 2022-08-22 DIAGNOSIS — E1122 Type 2 diabetes mellitus with diabetic chronic kidney disease: Secondary | ICD-10-CM | POA: Diagnosis not present

## 2022-08-26 DIAGNOSIS — G20C Parkinsonism, unspecified: Secondary | ICD-10-CM | POA: Diagnosis not present

## 2022-08-26 DIAGNOSIS — L84 Corns and callosities: Secondary | ICD-10-CM | POA: Diagnosis not present

## 2022-08-26 DIAGNOSIS — K219 Gastro-esophageal reflux disease without esophagitis: Secondary | ICD-10-CM | POA: Diagnosis not present

## 2022-08-26 DIAGNOSIS — R52 Pain, unspecified: Secondary | ICD-10-CM | POA: Diagnosis not present

## 2022-08-26 DIAGNOSIS — I10 Essential (primary) hypertension: Secondary | ICD-10-CM | POA: Diagnosis not present

## 2022-08-26 DIAGNOSIS — Z7189 Other specified counseling: Secondary | ICD-10-CM | POA: Diagnosis not present

## 2022-08-26 DIAGNOSIS — D75839 Thrombocytosis, unspecified: Secondary | ICD-10-CM | POA: Diagnosis not present

## 2022-08-28 DIAGNOSIS — D75839 Thrombocytosis, unspecified: Secondary | ICD-10-CM | POA: Diagnosis not present

## 2022-08-28 DIAGNOSIS — L84 Corns and callosities: Secondary | ICD-10-CM | POA: Diagnosis not present

## 2022-08-28 DIAGNOSIS — L97418 Non-pressure chronic ulcer of right heel and midfoot with other specified severity: Secondary | ICD-10-CM | POA: Diagnosis not present

## 2022-08-28 DIAGNOSIS — E1122 Type 2 diabetes mellitus with diabetic chronic kidney disease: Secondary | ICD-10-CM | POA: Diagnosis not present

## 2022-09-04 DIAGNOSIS — D75839 Thrombocytosis, unspecified: Secondary | ICD-10-CM | POA: Diagnosis not present

## 2022-09-04 DIAGNOSIS — E1122 Type 2 diabetes mellitus with diabetic chronic kidney disease: Secondary | ICD-10-CM | POA: Diagnosis not present

## 2022-09-04 DIAGNOSIS — L97418 Non-pressure chronic ulcer of right heel and midfoot with other specified severity: Secondary | ICD-10-CM | POA: Diagnosis not present

## 2022-09-04 DIAGNOSIS — L84 Corns and callosities: Secondary | ICD-10-CM | POA: Diagnosis not present

## 2022-09-18 DIAGNOSIS — L97418 Non-pressure chronic ulcer of right heel and midfoot with other specified severity: Secondary | ICD-10-CM | POA: Diagnosis not present

## 2022-09-18 DIAGNOSIS — D75839 Thrombocytosis, unspecified: Secondary | ICD-10-CM | POA: Diagnosis not present

## 2022-09-18 DIAGNOSIS — L84 Corns and callosities: Secondary | ICD-10-CM | POA: Diagnosis not present

## 2022-09-18 DIAGNOSIS — E1122 Type 2 diabetes mellitus with diabetic chronic kidney disease: Secondary | ICD-10-CM | POA: Diagnosis not present

## 2022-09-25 DIAGNOSIS — D75839 Thrombocytosis, unspecified: Secondary | ICD-10-CM | POA: Diagnosis not present

## 2022-09-25 DIAGNOSIS — L84 Corns and callosities: Secondary | ICD-10-CM | POA: Diagnosis not present

## 2022-09-25 DIAGNOSIS — E1122 Type 2 diabetes mellitus with diabetic chronic kidney disease: Secondary | ICD-10-CM | POA: Diagnosis not present

## 2022-09-25 DIAGNOSIS — L97418 Non-pressure chronic ulcer of right heel and midfoot with other specified severity: Secondary | ICD-10-CM | POA: Diagnosis not present

## 2022-09-30 DIAGNOSIS — R630 Anorexia: Secondary | ICD-10-CM | POA: Diagnosis not present

## 2022-09-30 DIAGNOSIS — R1310 Dysphagia, unspecified: Secondary | ICD-10-CM | POA: Diagnosis not present

## 2022-09-30 DIAGNOSIS — E1122 Type 2 diabetes mellitus with diabetic chronic kidney disease: Secondary | ICD-10-CM | POA: Diagnosis not present

## 2022-09-30 DIAGNOSIS — G629 Polyneuropathy, unspecified: Secondary | ICD-10-CM | POA: Diagnosis not present

## 2022-09-30 DIAGNOSIS — R531 Weakness: Secondary | ICD-10-CM | POA: Diagnosis not present

## 2022-10-02 DIAGNOSIS — D75839 Thrombocytosis, unspecified: Secondary | ICD-10-CM | POA: Diagnosis not present

## 2022-10-02 DIAGNOSIS — L84 Corns and callosities: Secondary | ICD-10-CM | POA: Diagnosis not present

## 2022-10-02 DIAGNOSIS — E1122 Type 2 diabetes mellitus with diabetic chronic kidney disease: Secondary | ICD-10-CM | POA: Diagnosis not present

## 2022-10-02 DIAGNOSIS — L97418 Non-pressure chronic ulcer of right heel and midfoot with other specified severity: Secondary | ICD-10-CM | POA: Diagnosis not present

## 2022-10-09 DIAGNOSIS — E1122 Type 2 diabetes mellitus with diabetic chronic kidney disease: Secondary | ICD-10-CM | POA: Diagnosis not present

## 2022-10-09 DIAGNOSIS — L84 Corns and callosities: Secondary | ICD-10-CM | POA: Diagnosis not present

## 2022-10-09 DIAGNOSIS — D75839 Thrombocytosis, unspecified: Secondary | ICD-10-CM | POA: Diagnosis not present

## 2022-10-09 DIAGNOSIS — L97418 Non-pressure chronic ulcer of right heel and midfoot with other specified severity: Secondary | ICD-10-CM | POA: Diagnosis not present

## 2022-10-16 DIAGNOSIS — L603 Nail dystrophy: Secondary | ICD-10-CM | POA: Diagnosis not present

## 2022-10-16 DIAGNOSIS — L84 Corns and callosities: Secondary | ICD-10-CM | POA: Diagnosis not present

## 2022-10-16 DIAGNOSIS — Z7984 Long term (current) use of oral hypoglycemic drugs: Secondary | ICD-10-CM | POA: Diagnosis not present

## 2022-10-16 DIAGNOSIS — D75839 Thrombocytosis, unspecified: Secondary | ICD-10-CM | POA: Diagnosis not present

## 2022-10-16 DIAGNOSIS — E1151 Type 2 diabetes mellitus with diabetic peripheral angiopathy without gangrene: Secondary | ICD-10-CM | POA: Diagnosis not present

## 2022-10-16 DIAGNOSIS — L602 Onychogryphosis: Secondary | ICD-10-CM | POA: Diagnosis not present

## 2022-10-16 DIAGNOSIS — E1122 Type 2 diabetes mellitus with diabetic chronic kidney disease: Secondary | ICD-10-CM | POA: Diagnosis not present

## 2022-10-16 DIAGNOSIS — L97418 Non-pressure chronic ulcer of right heel and midfoot with other specified severity: Secondary | ICD-10-CM | POA: Diagnosis not present

## 2022-10-23 DIAGNOSIS — L84 Corns and callosities: Secondary | ICD-10-CM | POA: Diagnosis not present

## 2022-10-23 DIAGNOSIS — E1122 Type 2 diabetes mellitus with diabetic chronic kidney disease: Secondary | ICD-10-CM | POA: Diagnosis not present

## 2022-10-23 DIAGNOSIS — D75839 Thrombocytosis, unspecified: Secondary | ICD-10-CM | POA: Diagnosis not present

## 2022-10-23 DIAGNOSIS — L97418 Non-pressure chronic ulcer of right heel and midfoot with other specified severity: Secondary | ICD-10-CM | POA: Diagnosis not present

## 2022-10-29 DIAGNOSIS — H35013 Changes in retinal vascular appearance, bilateral: Secondary | ICD-10-CM | POA: Diagnosis not present

## 2022-10-30 DIAGNOSIS — E1122 Type 2 diabetes mellitus with diabetic chronic kidney disease: Secondary | ICD-10-CM | POA: Diagnosis not present

## 2022-10-30 DIAGNOSIS — L97418 Non-pressure chronic ulcer of right heel and midfoot with other specified severity: Secondary | ICD-10-CM | POA: Diagnosis not present

## 2022-10-30 DIAGNOSIS — D75839 Thrombocytosis, unspecified: Secondary | ICD-10-CM | POA: Diagnosis not present

## 2022-10-30 DIAGNOSIS — L84 Corns and callosities: Secondary | ICD-10-CM | POA: Diagnosis not present

## 2022-11-07 DIAGNOSIS — R630 Anorexia: Secondary | ICD-10-CM | POA: Diagnosis not present

## 2022-11-07 DIAGNOSIS — S32401D Unspecified fracture of right acetabulum, subsequent encounter for fracture with routine healing: Secondary | ICD-10-CM | POA: Diagnosis not present

## 2022-11-07 DIAGNOSIS — L84 Corns and callosities: Secondary | ICD-10-CM | POA: Diagnosis not present

## 2022-11-07 DIAGNOSIS — E1122 Type 2 diabetes mellitus with diabetic chronic kidney disease: Secondary | ICD-10-CM | POA: Diagnosis not present

## 2022-11-07 DIAGNOSIS — G629 Polyneuropathy, unspecified: Secondary | ICD-10-CM | POA: Diagnosis not present

## 2022-11-07 DIAGNOSIS — D75839 Thrombocytosis, unspecified: Secondary | ICD-10-CM | POA: Diagnosis not present

## 2022-11-07 DIAGNOSIS — R1310 Dysphagia, unspecified: Secondary | ICD-10-CM | POA: Diagnosis not present

## 2022-11-07 DIAGNOSIS — R531 Weakness: Secondary | ICD-10-CM | POA: Diagnosis not present

## 2022-11-07 DIAGNOSIS — L97418 Non-pressure chronic ulcer of right heel and midfoot with other specified severity: Secondary | ICD-10-CM | POA: Diagnosis not present

## 2022-11-14 DIAGNOSIS — L84 Corns and callosities: Secondary | ICD-10-CM | POA: Diagnosis not present

## 2022-11-14 DIAGNOSIS — E1122 Type 2 diabetes mellitus with diabetic chronic kidney disease: Secondary | ICD-10-CM | POA: Diagnosis not present

## 2022-11-14 DIAGNOSIS — D75839 Thrombocytosis, unspecified: Secondary | ICD-10-CM | POA: Diagnosis not present

## 2022-11-14 DIAGNOSIS — L97418 Non-pressure chronic ulcer of right heel and midfoot with other specified severity: Secondary | ICD-10-CM | POA: Diagnosis not present

## 2022-12-10 ENCOUNTER — Emergency Department (HOSPITAL_COMMUNITY): Payer: Medicare HMO

## 2022-12-10 ENCOUNTER — Other Ambulatory Visit: Payer: Self-pay

## 2022-12-10 ENCOUNTER — Encounter (HOSPITAL_COMMUNITY): Payer: Self-pay

## 2022-12-10 ENCOUNTER — Inpatient Hospital Stay (HOSPITAL_COMMUNITY)
Admission: EM | Admit: 2022-12-10 | Discharge: 2022-12-16 | DRG: 536 | Disposition: A | Discharge status: Skilled Nursing Facility | Payer: Medicare HMO | Source: Skilled Nursing Facility | Attending: Internal Medicine | Admitting: Internal Medicine

## 2022-12-10 DIAGNOSIS — I2510 Atherosclerotic heart disease of native coronary artery without angina pectoris: Secondary | ICD-10-CM | POA: Diagnosis present

## 2022-12-10 DIAGNOSIS — M160 Bilateral primary osteoarthritis of hip: Secondary | ICD-10-CM | POA: Diagnosis not present

## 2022-12-10 DIAGNOSIS — S32401A Unspecified fracture of right acetabulum, initial encounter for closed fracture: Secondary | ICD-10-CM | POA: Diagnosis not present

## 2022-12-10 DIAGNOSIS — Z79899 Other long term (current) drug therapy: Secondary | ICD-10-CM

## 2022-12-10 DIAGNOSIS — S32409A Unspecified fracture of unspecified acetabulum, initial encounter for closed fracture: Secondary | ICD-10-CM | POA: Diagnosis present

## 2022-12-10 DIAGNOSIS — D75839 Thrombocytosis, unspecified: Secondary | ICD-10-CM | POA: Diagnosis present

## 2022-12-10 DIAGNOSIS — I10 Essential (primary) hypertension: Secondary | ICD-10-CM | POA: Diagnosis present

## 2022-12-10 DIAGNOSIS — E785 Hyperlipidemia, unspecified: Secondary | ICD-10-CM | POA: Diagnosis present

## 2022-12-10 DIAGNOSIS — Z87891 Personal history of nicotine dependence: Secondary | ICD-10-CM

## 2022-12-10 DIAGNOSIS — D72829 Elevated white blood cell count, unspecified: Secondary | ICD-10-CM | POA: Diagnosis present

## 2022-12-10 DIAGNOSIS — W19XXXA Unspecified fall, initial encounter: Secondary | ICD-10-CM | POA: Diagnosis not present

## 2022-12-10 DIAGNOSIS — K5900 Constipation, unspecified: Secondary | ICD-10-CM | POA: Diagnosis present

## 2022-12-10 DIAGNOSIS — S32301A Unspecified fracture of right ilium, initial encounter for closed fracture: Secondary | ICD-10-CM | POA: Diagnosis present

## 2022-12-10 DIAGNOSIS — Z743 Need for continuous supervision: Secondary | ICD-10-CM | POA: Diagnosis not present

## 2022-12-10 DIAGNOSIS — E1122 Type 2 diabetes mellitus with diabetic chronic kidney disease: Secondary | ICD-10-CM | POA: Diagnosis present

## 2022-12-10 DIAGNOSIS — S32511A Fracture of superior rim of right pubis, initial encounter for closed fracture: Secondary | ICD-10-CM | POA: Diagnosis not present

## 2022-12-10 DIAGNOSIS — D45 Polycythemia vera: Secondary | ICD-10-CM | POA: Diagnosis present

## 2022-12-10 DIAGNOSIS — E1165 Type 2 diabetes mellitus with hyperglycemia: Secondary | ICD-10-CM | POA: Diagnosis present

## 2022-12-10 DIAGNOSIS — W010XXA Fall on same level from slipping, tripping and stumbling without subsequent striking against object, initial encounter: Secondary | ICD-10-CM | POA: Diagnosis present

## 2022-12-10 DIAGNOSIS — G20A1 Parkinson's disease without dyskinesia, without mention of fluctuations: Secondary | ICD-10-CM | POA: Diagnosis present

## 2022-12-10 DIAGNOSIS — E118 Type 2 diabetes mellitus with unspecified complications: Secondary | ICD-10-CM | POA: Diagnosis present

## 2022-12-10 DIAGNOSIS — N1831 Chronic kidney disease, stage 3a: Secondary | ICD-10-CM | POA: Diagnosis present

## 2022-12-10 DIAGNOSIS — M47816 Spondylosis without myelopathy or radiculopathy, lumbar region: Secondary | ICD-10-CM | POA: Diagnosis not present

## 2022-12-10 DIAGNOSIS — S32591A Other specified fracture of right pubis, initial encounter for closed fracture: Secondary | ICD-10-CM | POA: Diagnosis present

## 2022-12-10 DIAGNOSIS — Y92129 Unspecified place in nursing home as the place of occurrence of the external cause: Secondary | ICD-10-CM

## 2022-12-10 DIAGNOSIS — Z955 Presence of coronary angioplasty implant and graft: Secondary | ICD-10-CM

## 2022-12-10 DIAGNOSIS — M25572 Pain in left ankle and joints of left foot: Secondary | ICD-10-CM | POA: Diagnosis not present

## 2022-12-10 DIAGNOSIS — I129 Hypertensive chronic kidney disease with stage 1 through stage 4 chronic kidney disease, or unspecified chronic kidney disease: Secondary | ICD-10-CM | POA: Diagnosis present

## 2022-12-10 DIAGNOSIS — M25551 Pain in right hip: Secondary | ICD-10-CM | POA: Diagnosis not present

## 2022-12-10 DIAGNOSIS — J9811 Atelectasis: Secondary | ICD-10-CM | POA: Diagnosis present

## 2022-12-10 DIAGNOSIS — K449 Diaphragmatic hernia without obstruction or gangrene: Secondary | ICD-10-CM | POA: Diagnosis present

## 2022-12-10 DIAGNOSIS — R739 Hyperglycemia, unspecified: Secondary | ICD-10-CM | POA: Diagnosis not present

## 2022-12-10 DIAGNOSIS — D471 Chronic myeloproliferative disease: Secondary | ICD-10-CM | POA: Diagnosis present

## 2022-12-10 DIAGNOSIS — Z7984 Long term (current) use of oral hypoglycemic drugs: Secondary | ICD-10-CM

## 2022-12-10 DIAGNOSIS — Z7982 Long term (current) use of aspirin: Secondary | ICD-10-CM

## 2022-12-10 DIAGNOSIS — Z9861 Coronary angioplasty status: Secondary | ICD-10-CM

## 2022-12-10 DIAGNOSIS — G20C Parkinsonism, unspecified: Secondary | ICD-10-CM | POA: Diagnosis present

## 2022-12-10 NOTE — ED Triage Notes (Signed)
Patient arrives via EMS for an unwitnessed fall. Patient was trying to get ambulate. States he slipped and fell. Patient was unsure if he hit his head. No blood thinners. No lacerations or bruising observed from EMS. Complains of right hip pain. EMS did not observe shortening or rotation. Patient is from [facility], in [place].

## 2022-12-11 ENCOUNTER — Emergency Department (HOSPITAL_COMMUNITY): Payer: Medicare HMO

## 2022-12-11 DIAGNOSIS — S32301A Unspecified fracture of right ilium, initial encounter for closed fracture: Secondary | ICD-10-CM | POA: Diagnosis not present

## 2022-12-11 DIAGNOSIS — W010XXA Fall on same level from slipping, tripping and stumbling without subsequent striking against object, initial encounter: Secondary | ICD-10-CM | POA: Diagnosis not present

## 2022-12-11 DIAGNOSIS — D471 Chronic myeloproliferative disease: Secondary | ICD-10-CM | POA: Diagnosis not present

## 2022-12-11 DIAGNOSIS — E1122 Type 2 diabetes mellitus with diabetic chronic kidney disease: Secondary | ICD-10-CM | POA: Diagnosis not present

## 2022-12-11 DIAGNOSIS — R1312 Dysphagia, oropharyngeal phase: Secondary | ICD-10-CM | POA: Diagnosis not present

## 2022-12-11 DIAGNOSIS — Z87891 Personal history of nicotine dependence: Secondary | ICD-10-CM | POA: Diagnosis not present

## 2022-12-11 DIAGNOSIS — K219 Gastro-esophageal reflux disease without esophagitis: Secondary | ICD-10-CM | POA: Diagnosis not present

## 2022-12-11 DIAGNOSIS — I129 Hypertensive chronic kidney disease with stage 1 through stage 4 chronic kidney disease, or unspecified chronic kidney disease: Secondary | ICD-10-CM | POA: Diagnosis not present

## 2022-12-11 DIAGNOSIS — D75839 Thrombocytosis, unspecified: Secondary | ICD-10-CM | POA: Diagnosis not present

## 2022-12-11 DIAGNOSIS — D45 Polycythemia vera: Secondary | ICD-10-CM | POA: Diagnosis not present

## 2022-12-11 DIAGNOSIS — I2510 Atherosclerotic heart disease of native coronary artery without angina pectoris: Secondary | ICD-10-CM | POA: Diagnosis not present

## 2022-12-11 DIAGNOSIS — S32591A Other specified fracture of right pubis, initial encounter for closed fracture: Secondary | ICD-10-CM | POA: Diagnosis not present

## 2022-12-11 DIAGNOSIS — Z043 Encounter for examination and observation following other accident: Secondary | ICD-10-CM | POA: Diagnosis not present

## 2022-12-11 DIAGNOSIS — R918 Other nonspecific abnormal finding of lung field: Secondary | ICD-10-CM | POA: Diagnosis not present

## 2022-12-11 DIAGNOSIS — S32409A Unspecified fracture of unspecified acetabulum, initial encounter for closed fracture: Secondary | ICD-10-CM

## 2022-12-11 DIAGNOSIS — S32474A Nondisplaced fracture of medial wall of right acetabulum, initial encounter for closed fracture: Secondary | ICD-10-CM | POA: Diagnosis not present

## 2022-12-11 DIAGNOSIS — E1165 Type 2 diabetes mellitus with hyperglycemia: Secondary | ICD-10-CM | POA: Diagnosis not present

## 2022-12-11 DIAGNOSIS — Z7401 Bed confinement status: Secondary | ICD-10-CM | POA: Diagnosis not present

## 2022-12-11 DIAGNOSIS — Z79899 Other long term (current) drug therapy: Secondary | ICD-10-CM | POA: Diagnosis not present

## 2022-12-11 DIAGNOSIS — G20C Parkinsonism, unspecified: Secondary | ICD-10-CM | POA: Diagnosis not present

## 2022-12-11 DIAGNOSIS — G20A1 Parkinson's disease without dyskinesia, without mention of fluctuations: Secondary | ICD-10-CM | POA: Diagnosis not present

## 2022-12-11 DIAGNOSIS — Z7984 Long term (current) use of oral hypoglycemic drugs: Secondary | ICD-10-CM | POA: Diagnosis not present

## 2022-12-11 DIAGNOSIS — Z9861 Coronary angioplasty status: Secondary | ICD-10-CM | POA: Diagnosis not present

## 2022-12-11 DIAGNOSIS — K5730 Diverticulosis of large intestine without perforation or abscess without bleeding: Secondary | ICD-10-CM | POA: Diagnosis not present

## 2022-12-11 DIAGNOSIS — S32511D Fracture of superior rim of right pubis, subsequent encounter for fracture with routine healing: Secondary | ICD-10-CM | POA: Diagnosis not present

## 2022-12-11 DIAGNOSIS — N1831 Chronic kidney disease, stage 3a: Secondary | ICD-10-CM | POA: Diagnosis not present

## 2022-12-11 DIAGNOSIS — E785 Hyperlipidemia, unspecified: Secondary | ICD-10-CM | POA: Diagnosis not present

## 2022-12-11 DIAGNOSIS — F32A Depression, unspecified: Secondary | ICD-10-CM | POA: Diagnosis not present

## 2022-12-11 DIAGNOSIS — S32401D Unspecified fracture of right acetabulum, subsequent encounter for fracture with routine healing: Secondary | ICD-10-CM | POA: Diagnosis not present

## 2022-12-11 DIAGNOSIS — S32401A Unspecified fracture of right acetabulum, initial encounter for closed fracture: Secondary | ICD-10-CM | POA: Diagnosis not present

## 2022-12-11 DIAGNOSIS — M6281 Muscle weakness (generalized): Secondary | ICD-10-CM | POA: Diagnosis not present

## 2022-12-11 DIAGNOSIS — J9811 Atelectasis: Secondary | ICD-10-CM | POA: Diagnosis not present

## 2022-12-11 DIAGNOSIS — R1310 Dysphagia, unspecified: Secondary | ICD-10-CM | POA: Diagnosis not present

## 2022-12-11 DIAGNOSIS — R531 Weakness: Secondary | ICD-10-CM | POA: Diagnosis not present

## 2022-12-11 DIAGNOSIS — Y92129 Unspecified place in nursing home as the place of occurrence of the external cause: Secondary | ICD-10-CM | POA: Diagnosis not present

## 2022-12-11 DIAGNOSIS — G218 Other secondary parkinsonism: Secondary | ICD-10-CM | POA: Diagnosis not present

## 2022-12-11 DIAGNOSIS — K5900 Constipation, unspecified: Secondary | ICD-10-CM | POA: Diagnosis not present

## 2022-12-11 DIAGNOSIS — W19XXXA Unspecified fall, initial encounter: Secondary | ICD-10-CM | POA: Diagnosis not present

## 2022-12-11 DIAGNOSIS — I10 Essential (primary) hypertension: Secondary | ICD-10-CM | POA: Diagnosis not present

## 2022-12-11 DIAGNOSIS — S32511A Fracture of superior rim of right pubis, initial encounter for closed fracture: Secondary | ICD-10-CM | POA: Diagnosis not present

## 2022-12-11 DIAGNOSIS — K449 Diaphragmatic hernia without obstruction or gangrene: Secondary | ICD-10-CM | POA: Diagnosis not present

## 2022-12-11 DIAGNOSIS — Z7982 Long term (current) use of aspirin: Secondary | ICD-10-CM | POA: Diagnosis not present

## 2022-12-11 DIAGNOSIS — R278 Other lack of coordination: Secondary | ICD-10-CM | POA: Diagnosis not present

## 2022-12-11 DIAGNOSIS — D72829 Elevated white blood cell count, unspecified: Secondary | ICD-10-CM | POA: Diagnosis not present

## 2022-12-11 LAB — URINALYSIS, ROUTINE W REFLEX MICROSCOPIC
Bacteria, UA: NONE SEEN
Bilirubin Urine: NEGATIVE
Glucose, UA: NEGATIVE mg/dL
Hgb urine dipstick: NEGATIVE
Ketones, ur: 5 mg/dL — AB
Nitrite: NEGATIVE
Protein, ur: NEGATIVE mg/dL
Specific Gravity, Urine: 1.016 (ref 1.005–1.030)
pH: 5 (ref 5.0–8.0)

## 2022-12-11 LAB — CBC WITH DIFFERENTIAL/PLATELET
Abs Immature Granulocytes: 0.37 10*3/uL — ABNORMAL HIGH (ref 0.00–0.07)
Basophils Absolute: 0.2 10*3/uL — ABNORMAL HIGH (ref 0.0–0.1)
Basophils Relative: 1 %
Eosinophils Absolute: 0.3 10*3/uL (ref 0.0–0.5)
Eosinophils Relative: 2 %
HCT: 52.5 % — ABNORMAL HIGH (ref 39.0–52.0)
Hemoglobin: 16.5 g/dL (ref 13.0–17.0)
Immature Granulocytes: 2 %
Lymphocytes Relative: 9 %
Lymphs Abs: 1.9 10*3/uL (ref 0.7–4.0)
MCH: 28.9 pg (ref 26.0–34.0)
MCHC: 31.4 g/dL (ref 30.0–36.0)
MCV: 91.9 fL (ref 80.0–100.0)
Monocytes Absolute: 1.4 10*3/uL — ABNORMAL HIGH (ref 0.1–1.0)
Monocytes Relative: 7 %
Neutro Abs: 16.5 10*3/uL — ABNORMAL HIGH (ref 1.7–7.7)
Neutrophils Relative %: 79 %
Platelets: 778 10*3/uL — ABNORMAL HIGH (ref 150–400)
RBC: 5.71 MIL/uL (ref 4.22–5.81)
RDW: 17.9 % — ABNORMAL HIGH (ref 11.5–15.5)
WBC: 20.7 10*3/uL — ABNORMAL HIGH (ref 4.0–10.5)
nRBC: 0 % (ref 0.0–0.2)

## 2022-12-11 LAB — GLUCOSE, CAPILLARY
Glucose-Capillary: 197 mg/dL — ABNORMAL HIGH (ref 70–99)
Glucose-Capillary: 205 mg/dL — ABNORMAL HIGH (ref 70–99)
Glucose-Capillary: 264 mg/dL — ABNORMAL HIGH (ref 70–99)

## 2022-12-11 LAB — I-STAT CHEM 8, ED
BUN: 31 mg/dL — ABNORMAL HIGH (ref 8–23)
Calcium, Ion: 1.25 mmol/L (ref 1.15–1.40)
Chloride: 102 mmol/L (ref 98–111)
Creatinine, Ser: 1.4 mg/dL — ABNORMAL HIGH (ref 0.61–1.24)
Glucose, Bld: 146 mg/dL — ABNORMAL HIGH (ref 70–99)
HCT: 55 % — ABNORMAL HIGH (ref 39.0–52.0)
Hemoglobin: 18.7 g/dL — ABNORMAL HIGH (ref 13.0–17.0)
Potassium: 4.5 mmol/L (ref 3.5–5.1)
Sodium: 135 mmol/L (ref 135–145)
TCO2: 25 mmol/L (ref 22–32)

## 2022-12-11 LAB — CBC
HCT: 48.1 % (ref 39.0–52.0)
Hemoglobin: 15.5 g/dL (ref 13.0–17.0)
MCH: 29.4 pg (ref 26.0–34.0)
MCHC: 32.2 g/dL (ref 30.0–36.0)
MCV: 91.3 fL (ref 80.0–100.0)
Platelets: 698 10*3/uL — ABNORMAL HIGH (ref 150–400)
RBC: 5.27 MIL/uL (ref 4.22–5.81)
RDW: 17.2 % — ABNORMAL HIGH (ref 11.5–15.5)
WBC: 20 10*3/uL — ABNORMAL HIGH (ref 4.0–10.5)
nRBC: 0.1 % (ref 0.0–0.2)

## 2022-12-11 LAB — BASIC METABOLIC PANEL
Anion gap: 10 (ref 5–15)
BUN: 24 mg/dL — ABNORMAL HIGH (ref 8–23)
CO2: 19 mmol/L — ABNORMAL LOW (ref 22–32)
Calcium: 9.6 mg/dL (ref 8.9–10.3)
Chloride: 101 mmol/L (ref 98–111)
Creatinine, Ser: 1.13 mg/dL (ref 0.61–1.24)
GFR, Estimated: >60 mL/min (ref >60)
Glucose, Bld: 177 mg/dL — ABNORMAL HIGH (ref 70–99)
Potassium: 4.6 mmol/L (ref 3.5–5.1)
Sodium: 130 mmol/L — ABNORMAL LOW (ref 135–145)

## 2022-12-11 LAB — CBG MONITORING, ED
Glucose-Capillary: 218 mg/dL — ABNORMAL HIGH (ref 70–99)
Glucose-Capillary: 196 mg/dL — ABNORMAL HIGH (ref 70–99)

## 2022-12-11 MED ORDER — ALBUTEROL SULFATE (2.5 MG/3ML) 0.083% IN NEBU
2.5000 mg | INHALATION_SOLUTION | Freq: Four times a day (QID) | RESPIRATORY_TRACT | Status: DC | PRN
Start: 2022-12-11 — End: 2022-12-16

## 2022-12-11 MED ORDER — ACETAMINOPHEN 325 MG PO TABS
650.0000 mg | ORAL_TABLET | Freq: Four times a day (QID) | ORAL | Status: DC | PRN
  Administered 2022-12-14 – 2022-12-16 (×2): 650 mg via ORAL
  Filled 2022-12-11 (×3): qty 2

## 2022-12-11 MED ORDER — ACETAMINOPHEN 650 MG RE SUPP: 650.0000 mg | Freq: Four times a day (QID) | RECTAL | Status: DC | PRN

## 2022-12-11 MED ORDER — FENTANYL CITRATE PF 50 MCG/ML IJ SOSY
25.0000 ug | PREFILLED_SYRINGE | INTRAMUSCULAR | Status: DC | PRN
  Administered 2022-12-11 (×2): 25 ug via INTRAVENOUS
  Filled 2022-12-11 (×2): qty 1

## 2022-12-11 MED ORDER — HYDRALAZINE HCL 20 MG/ML IJ SOLN: 10.0000 mg | Freq: Four times a day (QID) | INTRAMUSCULAR | Status: DC | PRN

## 2022-12-11 MED ORDER — METHOCARBAMOL 1000 MG/10ML IJ SOLN: 500.0000 mg | Freq: Four times a day (QID) | INTRAMUSCULAR | Status: DC | PRN

## 2022-12-11 MED ORDER — ENOXAPARIN SODIUM 40 MG/0.4ML IJ SOSY
40.0000 mg | PREFILLED_SYRINGE | INTRAMUSCULAR | Status: DC
Start: 2022-12-12 — End: 2022-12-16
  Administered 2022-12-12 – 2022-12-16 (×5): 40 mg via SUBCUTANEOUS
  Filled 2022-12-11 (×5): qty 0.4

## 2022-12-11 MED ORDER — CARBIDOPA-LEVODOPA 25-100 MG PO TABS
2.0000 | ORAL_TABLET | Freq: Three times a day (TID) | ORAL | Status: DC
  Administered 2022-12-11 – 2022-12-12 (×4): 2 via ORAL
  Filled 2022-12-11 (×4): qty 2

## 2022-12-11 MED ORDER — SENNA 8.6 MG PO TABS
1.0000 | ORAL_TABLET | Freq: Two times a day (BID) | ORAL | Status: DC
  Administered 2022-12-11 – 2022-12-16 (×10): 8.6 mg via ORAL
  Filled 2022-12-11 (×10): qty 1

## 2022-12-11 MED ORDER — HYDROMORPHONE HCL 1 MG/ML IJ SOLN
0.5000 mg | INTRAMUSCULAR | Status: DC | PRN
Start: 2022-12-11 — End: 2022-12-16
  Administered 2022-12-11 – 2022-12-13 (×4): 0.5 mg via INTRAVENOUS
  Filled 2022-12-11: qty 0.5
  Filled 2022-12-11: qty 1
  Filled 2022-12-11: qty 0.5
  Filled 2022-12-11: qty 1

## 2022-12-11 MED ORDER — FENTANYL CITRATE PF 50 MCG/ML IJ SOSY
25.0000 ug | PREFILLED_SYRINGE | Freq: Once | INTRAMUSCULAR | Status: AC
  Administered 2022-12-11: 25 ug via INTRAVENOUS
  Filled 2022-12-11: qty 1

## 2022-12-11 MED ORDER — OXYCODONE HCL 5 MG PO TABS
5.0000 mg | ORAL_TABLET | ORAL | Status: DC | PRN
  Administered 2022-12-12 – 2022-12-16 (×11): 5 mg via ORAL
  Filled 2022-12-11 (×12): qty 1

## 2022-12-11 MED ORDER — INSULIN ASPART 100 UNIT/ML IJ SOLN
0.0000 [IU] | INTRAMUSCULAR | Status: DC
  Administered 2022-12-11: 3 [IU] via SUBCUTANEOUS
  Administered 2022-12-11 (×2): 2 [IU] via SUBCUTANEOUS
  Administered 2022-12-11: 5 [IU] via SUBCUTANEOUS
  Administered 2022-12-12: 3 [IU] via SUBCUTANEOUS
  Administered 2022-12-12: 1 [IU] via SUBCUTANEOUS
  Administered 2022-12-12: 2 [IU] via SUBCUTANEOUS

## 2022-12-11 MED ORDER — POLYETHYLENE GLYCOL 3350 17 G PO PACK: 17.0000 g | PACK | Freq: Every day | ORAL | Status: DC | PRN

## 2022-12-11 MED ORDER — ONDANSETRON HCL 4 MG/2ML IJ SOLN
4.0000 mg | Freq: Once | INTRAMUSCULAR | Status: AC
  Administered 2022-12-11: 4 mg via INTRAVENOUS
  Filled 2022-12-11: qty 2

## 2022-12-11 NOTE — ED Notes (Signed)
ED TO INPATIENT HANDOFF REPORT  ED Nurse Name and Phone #: Marchelle Folks [phone]  S Name/Age/Gender [name] 89 y.o. male Room/Bed: RESUSC/RESUSC  Code Status   Code Status: Full Code  Home/SNF/Other Skilled nursing facility Patient oriented to: self, place, and time Is this baseline? Yes   Triage Complete: Triage complete  Chief Complaint Acetabulum fracture [facility]) [S32.409A]  Triage Note Patient arrives via EMS for an unwitnessed fall. Patient was trying to get ambulate. States he slipped and fell. Patient was unsure if he hit his head. No blood thinners. No lacerations or bruising observed from EMS. Complains of right hip pain. EMS did not observe shortening or rotation. Patient is from [facility], in [place].    Allergies No Known Allergies  Level of Care/Admitting Diagnosis ED Disposition     ED Disposition  Admit   Condition  --   Comment  Hospital Area: [facility] [[id number]]  Level of Care: Telemetry Surgical [105]  May admit patient to [facility] or [facility] if equivalent level of care is available:: Yes  Covid Evaluation: Asymptomatic - no recent exposure (last 10 days) testing not required  Diagnosis: Acetabulum fracture [facility]) [[id number]]  Admitting Physician: Darlin Drop [[id number]]  Attending Physician: Darlin Drop [[id number]]  Certification:: I certify this patient will need inpatient services for at least 2 midnights  Expected Medical Readiness: 12/13/2022          B Medical/Surgery History Past Medical History:  Diagnosis Date   Chest pain    Coronary artery disease    Diabetes mellitus without complication (HCC)    Hyperlipidemia    Hypertension    Past Surgical History:  Procedure Laterality Date   CARDIAC CATHETERIZATION  11/01/2005   patent stents with normal LV function   CARDIAC CATHETERIZATION  11/08/2003   cypher stent mid dominant right coronary lesion   CARDIAC SURGERY     CORONARY  ANGIOPLASTY     post LAD and RCA stenting   DOPPLER ECHOCARDIOGRAPHY  06/18/2010   EF =>55%,LV normal   EYE SURGERY     holter monitor  11/08/2005   NM MYOCAR PERF WALL MOTION  05/23/2008   EF 62% ,norm myocardial perfusion     A IV Location/Drains/Wounds Patient Lines/Drains/Airways Status     Active Line/Drains/Airways     Name Placement date Placement time Site Days   Peripheral IV 12/11/22 20 G 1" Left Forearm 12/11/22  0030  Forearm  less than 1            Intake/Output Last 24 hours No intake or output data in the 24 hours ending 12/11/22 1429  Labs/Imaging Results for orders placed or performed during the hospital encounter of 12/10/22 (from the past 48 hour(s))  CBC with Differential     Status: Abnormal   Collection Time: 12/11/22 12:30 AM  Result Value Ref Range   WBC 20.7 (H) 4.0 - 10.5 K/uL   RBC 5.71 4.22 - 5.81 MIL/uL   Hemoglobin 16.5 13.0 - 17.0 g/dL   HCT 51.8 (H) 84.1 - 66.0 %   MCV 91.9 80.0 - 100.0 fL   MCH 28.9 26.0 - 34.0 pg   MCHC 31.4 30.0 - 36.0 g/dL   RDW 63.0 (H) 16.0 - 10.9 %   Platelets 778 (H) 150 - 400 K/uL   nRBC 0.0 0.0 - 0.2 %   Neutrophils Relative % 79 %   Neutro Abs 16.5 (H) 1.7 - 7.7 K/uL  Lymphocytes Relative 9 %   Lymphs Abs 1.9 0.7 - 4.0 K/uL   Monocytes Relative 7 %   Monocytes Absolute 1.4 (H) 0.1 - 1.0 K/uL   Eosinophils Relative 2 %   Eosinophils Absolute 0.3 0.0 - 0.5 K/uL   Basophils Relative 1 %   Basophils Absolute 0.2 (H) 0.0 - 0.1 K/uL   Immature Granulocytes 2 %   Abs Immature Granulocytes 0.37 (H) 0.00 - 0.07 K/uL    Comment: Performed at [facility], [address], [place], [place] [address]  I-stat chem 8, ED (not at [facility], [facility] or [facility])     Status: Abnormal   Collection Time: 12/11/22 12:51 AM  Result Value Ref Range   Sodium 135 135 - 145 mmol/L   Potassium 4.5 3.5 - 5.1 mmol/L   Chloride 102 98 - 111 mmol/L   BUN 31 (H) 8 - 23 mg/dL   Creatinine, Ser 0.98 (H) 0.61 - 1.24 mg/dL   Glucose, Bld  119 (H) 70 - 99 mg/dL    Comment: Glucose reference range applies only to samples taken after fasting for at least 8 hours.   Calcium, Ion 1.25 1.15 - 1.40 mmol/L   TCO2 25 22 - 32 mmol/L   Hemoglobin 18.7 (H) 13.0 - 17.0 g/dL   HCT 14.7 (H) 82.9 - 56.2 %  Urinalysis, Routine w reflex microscopic -Urine, Clean Catch     Status: Abnormal   Collection Time: 12/11/22  7:11 AM  Result Value Ref Range   Color, Urine YELLOW YELLOW   APPearance CLEAR CLEAR   Specific Gravity, Urine 1.016 1.005 - 1.030   pH 5.0 5.0 - 8.0   Glucose, UA NEGATIVE NEGATIVE mg/dL   Hgb urine dipstick NEGATIVE NEGATIVE   Bilirubin Urine NEGATIVE NEGATIVE   Ketones, ur 5 (A) NEGATIVE mg/dL   Protein, ur NEGATIVE NEGATIVE mg/dL   Nitrite NEGATIVE NEGATIVE   Leukocytes,Ua SMALL (A) NEGATIVE   RBC / HPF 0-5 0 - 5 RBC/hpf   WBC, UA 21-50 0 - 5 WBC/hpf   Bacteria, UA NONE SEEN NONE SEEN   Squamous Epithelial / HPF 0-5 0 - 5 /HPF   Mucus PRESENT     Comment: Performed at [facility], [address] [place], [place] [address]  CBG monitoring, ED     Status: Abnormal   Collection Time: 12/11/22  8:18 AM  Result Value Ref Range   Glucose-Capillary 218 (H) 70 - 99 mg/dL    Comment: Glucose reference range applies only to samples taken after fasting for at least 8 hours.  CBC     Status: Abnormal   Collection Time: 12/11/22  8:58 AM  Result Value Ref Range   WBC 20.0 (H) 4.0 - 10.5 K/uL   RBC 5.27 4.22 - 5.81 MIL/uL   Hemoglobin 15.5 13.0 - 17.0 g/dL   HCT 57.8 46.9 - 62.9 %   MCV 91.3 80.0 - 100.0 fL   MCH 29.4 26.0 - 34.0 pg   MCHC 32.2 30.0 - 36.0 g/dL   RDW 52.8 (H) 41.3 - 24.4 %   Platelets 698 (H) 150 - 400 K/uL    Comment: REPEATED TO VERIFY   nRBC 0.1 0.0 - 0.2 %    Comment: Performed at [facility], [address], [place], [place] [address]  Basic metabolic panel     Status: Abnormal   Collection Time: 12/11/22 11:31 AM  Result Value Ref Range   Sodium 130 (L) 135 -  145 mmol/L    Potassium 4.6 3.5 - 5.1 mmol/L   Chloride 101 98 - 111 mmol/L   CO2 19 (L) 22 - 32 mmol/L   Glucose, Bld 177 (H) 70 - 99 mg/dL    Comment: Glucose reference range applies only to samples taken after fasting for at least 8 hours.   BUN 24 (H) 8 - 23 mg/dL   Creatinine, Ser 0.10 0.61 - 1.24 mg/dL   Calcium 9.6 8.9 - 27.2 mg/dL   GFR, Estimated >53 >66 mL/min    Comment: (NOTE) Calculated using the CKD-EPI Creatinine Equation (2021)    Anion gap 10 5 - 15    Comment: Performed at [facility], [address], [place], [place] [address]  CBG monitoring, ED     Status: Abnormal   Collection Time: 12/11/22  1:01 PM  Result Value Ref Range   Glucose-Capillary 196 (H) 70 - 99 mg/dL    Comment: Glucose reference range applies only to samples taken after fasting for at least 8 hours.   CT PELVIS WO CONTRAST  Result Date: 12/11/2022 CLINICAL DATA:  Unwitnessed fall. EXAM: CT PELVIS WITHOUT CONTRAST TECHNIQUE: Multidetector CT imaging of the pelvis was performed following the standard protocol without intravenous contrast. RADIATION DOSE REDUCTION: This exam was performed according to the departmental dose-optimization program which includes automated exposure control, adjustment of the mA and/or kV according to patient size and/or use of iterative reconstruction technique. COMPARISON:  None Available. FINDINGS: Urinary Tract:  No abnormality visualized. Bowel: There is no evidence of bowel dilatation. The appendix is not identified. Numerous noninflamed diverticula are seen throughout the visualized portions of the large bowel. Vascular/Lymphatic: No pathologically enlarged lymph nodes. There is marked severity calcification of the visualized portion of the abdominal aorta and bilateral common iliac arteries. Reproductive:  The prostate gland is mildly enlarged. Other:  None. Musculoskeletal: Acute nondisplaced fracture deformity is seen involving the medial aspect of the right iliac bone. This  extends to involve the superior and medial aspects of the right acetabulum. Additional fractures of the right superior and right inferior pubic rami are seen. A 7.7 cm x 2.8 cm x 5.9 cm hematoma is seen along the medial aspect of the right acetabulum, adjacent to the urinary bladder. IMPRESSION: 1. Acute nondisplaced fracture deformity involving the medial aspect of the right iliac bone which extends to involve the superior and medial aspects of the right acetabulum. 2. Additional fractures of the right superior and right inferior pubic rami. 3. 7.7 cm x 2.8 cm x 5.9 cm hematoma along the medial aspect of the right acetabulum, adjacent to the urinary bladder. 4. Colonic diverticulosis. 5. Aortic atherosclerosis. Aortic Atherosclerosis (ICD10-I70.0). Electronically Signed   By: Aram Candela M.D.   On: 12/11/2022 01:04   DG Chest Portable 1 View  Result Date: 12/11/2022 CLINICAL DATA:  Status post fall. EXAM: PORTABLE CHEST 1 VIEW COMPARISON:  February 09, 2014 FINDINGS: The heart size and mediastinal contours are within normal limits. Mild, diffuse, chronic appearing increased lung markings are seen. Mild atelectasis is noted within the left lung base. Small multifocal areas of atelectasis and/or infiltrate are also seen scattered throughout the right lung. A moderate sized hiatal hernia is suspected within the retrocardiac region of the left lung base. No pleural effusion or pneumothorax is identified. A chronic fracture deformity of the left humeral head and neck is seen. No acute osseous abnormalities are identified. IMPRESSION: 1. Mild left basilar atelectasis. 2. Small multifocal areas of right  lung atelectasis and/or infiltrate. 3. Findings suspicious for a moderate sized hiatal hernia. Correlation with nonemergent abdomen and pelvis CT is recommended. Electronically Signed   By: Aram Candela M.D.   On: 12/11/2022 00:56   DG Hip Unilat  With Pelvis 2-3 Views Right  Result Date:  12/10/2022 CLINICAL DATA:  Hip pain after a fall.  Slip and fall injury. EXAM: DG HIP (WITH OR WITHOUT PELVIS) 2-3V RIGHT COMPARISON:  None Available. FINDINGS: Mild degenerative changes in the lower lumbar spine and hips. The right hip appears intact. No evidence of acute fracture or dislocation. Fractures are demonstrated in the right superior and inferior pubic rami, likely acute. SI joints and symphysis pubis are not displaced. No focal bone lesion or bone destruction. Vascular calcifications. IMPRESSION: Fractures of the right superior and inferior pubic rami, probably acute. Otherwise, no acute fracture or dislocation of the right hip. Mild degenerative changes. Electronically Signed   By: Burman Nieves M.D.   On: 12/10/2022 23:59    Pending Labs Unresulted Labs (From admission, onward)     Start     Ordered   12/12/22 0500  Hemoglobin A1c  Tomorrow morning,   R       Comments: To assess prior glycemic control    12/11/22 0758   12/12/22 0500  Basic metabolic panel  Tomorrow morning,   R        12/11/22 0758   12/12/22 0500  CBC  Tomorrow morning,   R        12/11/22 0758            Vitals/Pain Today's Vitals   12/11/22 0942 12/11/22 1200 12/11/22 1322 12/11/22 1346  BP:  103/63    Pulse:  99    Resp:  17    Temp:   98.2 F (36.8 C)   TempSrc:      SpO2:  95%    PainSc: 10-Worst pain ever   10-Worst pain ever    Isolation Precautions No active isolations  Medications Medications  insulin aspart (novoLOG) injection 0-9 Units (2 Units Subcutaneous Given 12/11/22 1320)  acetaminophen (TYLENOL) tablet 650 mg (has no administration in time range)    Or  acetaminophen (TYLENOL) suppository 650 mg (has no administration in time range)  albuterol (PROVENTIL) (2.5 MG/3ML) 0.083% nebulizer solution 2.5 mg (has no administration in time range)  hydrALAZINE (APRESOLINE) injection 10 mg (has no administration in time range)  enoxaparin (LOVENOX) injection 40 mg (has no  administration in time range)  oxyCODONE (Oxy IR/ROXICODONE) immediate release tablet 5 mg (has no administration in time range)  HYDROmorphone (DILAUDID) injection 0.5 mg (0.5 mg Intravenous Given 12/11/22 1345)  methocarbamol (ROBAXIN) injection 500 mg (has no administration in time range)  senna (SENOKOT) tablet 8.6 mg (8.6 mg Oral Given 12/11/22 0940)  polyethylene glycol (MIRALAX / GLYCOLAX) packet 17 g (has no administration in time range)  carbidopa-levodopa (SINEMET IR) 25-100 MG per tablet immediate release 2 tablet (2 tablets Oral Given 12/11/22 1320)  fentaNYL (SUBLIMAZE) injection 25 mcg (25 mcg Intravenous Given 12/11/22 0031)  ondansetron (ZOFRAN) injection 4 mg (4 mg Intravenous Given 12/11/22 0121)    Mobility non-ambulatory     Focused Assessments Cardiac Assessment Handoff:  Cardiac Rhythm: Normal sinus rhythm Lab Results  Component Value Date   CKTOTAL 22 06/18/2012   TROPONINI <0.30 06/18/2012   No results found for: "DDIMER" Does the Patient currently have chest pain? No    R Recommendations: See Admitting Provider Note  Report  given to:   Additional Notes:  pt is having troubble swallowing for me. Slp consalt put in.

## 2022-12-11 NOTE — ED Notes (Addendum)
Son, Gabriel Rung, would like to be contacted prior to surgery or any changes in pt condition. Phone number [phone]

## 2022-12-11 NOTE — ED Provider Notes (Signed)
[place] [facility] AT [facility] Provider Note   CSN: [id number] Arrival date & time: 12/10/22  2332     History  Chief Complaint  Patient presents with   Fall    [name] is a 89 y.o. male.  The history is provided by the patient, the EMS personnel and a relative.  Fall This is a new problem. The current episode started less than 1 hour ago. The problem occurs rarely. The problem has been resolved. Pertinent negatives include no chest pain, no headaches and no shortness of breath. Associated symptoms comments: R hip and buttocks pain . Nothing aggravates the symptoms. Nothing relieves the symptoms. He has tried nothing for the symptoms. The treatment provided no relief.  Patient with DM and thrombocytosis presents with fall onto right hip.  Did not hit head, no LOC.  No blood thinners.      Past Medical History:  Diagnosis Date   Chest pain    Coronary artery disease    Diabetes mellitus without complication (HCC)    Hyperlipidemia    Hypertension      Home Medications Prior to Admission medications   Medication Sig Start Date End Date Taking? Authorizing Provider  ASPIRIN 81 PO Take 81 mg by mouth daily.    [provider]  carbidopa-levodopa (SINEMET IR) 25-100 MG tablet Take 2 tablets by mouth 3 (three) times daily. At 8am, noon, 5pm 01/01/22   Levert Feinstein, MD  furosemide (LASIX) 40 MG tablet Take 1 tablet (40 mg total) by mouth daily. 06/11/19 10/01/21  Runell Gess, MD  hydroxyurea (HYDREA) 500 MG capsule TAKE 1 CAPSULE BY MOUTH ONCE DAILY WITH FOOD 03/29/21   Si Gaul, MD  losartan (COZAAR) 100 MG tablet Take 100 mg by mouth daily.    [provider]  metoprolol tartrate (LOPRESSOR) 50 MG tablet Take 1 tablet by mouth twice daily 06/09/20   Runell Gess, MD  Multiple Vitamin (MULTIVITAMIN WITH MINERALS) TABS tablet Take 1 tablet by mouth every morning.    [provider]  omeprazole (PRILOSEC) 40 MG  capsule Take 40 mg by mouth daily. 06/08/20   [provider]  [employer] [name] FOOT REPAIR) 15 % CREA Apply topically.    [provider]      Allergies    Patient has no known allergies.    Review of Systems   Review of Systems  Constitutional:  Negative for fever.  HENT:  Negative for facial swelling.   Eyes:  Negative for redness.  Respiratory:  Negative for shortness of breath.   Cardiovascular:  Negative for chest pain.  Neurological:  Negative for headaches.  All other systems reviewed and are negative.   Physical Exam Updated Vital Signs BP 139/80 (BP Location: Left Arm)   Pulse 72   Temp (!) 97.5 F (36.4 C) (Oral)   Resp 16   SpO2 98%  Physical Exam Vitals and nursing note reviewed.  Constitutional:      General: He is not in acute distress.    Appearance: Normal appearance. He is well-developed. He is not diaphoretic.  HENT:     Head: Normocephalic and atraumatic.     Nose: Nose normal.  Eyes:     Conjunctiva/sclera: Conjunctivae normal.     Pupils: Pupils are equal, round, and reactive to light.  Cardiovascular:     Rate and Rhythm: Normal rate and regular rhythm.     Pulses: Normal pulses.     Heart  sounds: Normal heart sounds.  Pulmonary:     Effort: Pulmonary effort is normal.     Breath sounds: Normal breath sounds. No wheezing or rales.  Abdominal:     General: Bowel sounds are normal.     Palpations: Abdomen is soft.     Tenderness: There is no abdominal tenderness. There is no guarding or rebound.  Musculoskeletal:        General: Normal range of motion.     Cervical back: Normal range of motion and neck supple.  Skin:    General: Skin is warm and dry.     Capillary Refill: Capillary refill takes less than 2 seconds.  Neurological:     General: No focal deficit present.     Mental Status: He is alert and oriented to person, place, and time.     Deep Tendon Reflexes: Reflexes normal.  Psychiatric:        Mood and  Affect: Mood normal.        Thought Content: Thought content normal.     ED Results / Procedures / Treatments   Labs (all labs ordered are listed, but only abnormal results are displayed) Results for orders placed or performed during the hospital encounter of 12/10/22  CBC with Differential  Result Value Ref Range   WBC 20.7 (H) 4.0 - 10.5 K/uL   RBC 5.71 4.22 - 5.81 MIL/uL   Hemoglobin 16.5 13.0 - 17.0 g/dL   HCT 19.1 (H) 47.8 - 29.5 %   MCV 91.9 80.0 - 100.0 fL   MCH 28.9 26.0 - 34.0 pg   MCHC 31.4 30.0 - 36.0 g/dL   RDW 62.1 (H) 30.8 - 65.7 %   Platelets 778 (H) 150 - 400 K/uL   nRBC 0.0 0.0 - 0.2 %   Neutrophils Relative % 79 %   Neutro Abs 16.5 (H) 1.7 - 7.7 K/uL   Lymphocytes Relative 9 %   Lymphs Abs 1.9 0.7 - 4.0 K/uL   Monocytes Relative 7 %   Monocytes Absolute 1.4 (H) 0.1 - 1.0 K/uL   Eosinophils Relative 2 %   Eosinophils Absolute 0.3 0.0 - 0.5 K/uL   Basophils Relative 1 %   Basophils Absolute 0.2 (H) 0.0 - 0.1 K/uL   Immature Granulocytes 2 %   Abs Immature Granulocytes 0.37 (H) 0.00 - 0.07 K/uL  I-stat chem 8, ED (not at [facility], [facility] or [facility])  Result Value Ref Range   Sodium 135 135 - 145 mmol/L   Potassium 4.5 3.5 - 5.1 mmol/L   Chloride 102 98 - 111 mmol/L   BUN 31 (H) 8 - 23 mg/dL   Creatinine, Ser 8.46 (H) 0.61 - 1.24 mg/dL   Glucose, Bld 962 (H) 70 - 99 mg/dL   Calcium, Ion 9.52 8.41 - 1.40 mmol/L   TCO2 25 22 - 32 mmol/L   Hemoglobin 18.7 (H) 13.0 - 17.0 g/dL   HCT 32.4 (H) 40.1 - 02.7 %   CT PELVIS WO CONTRAST  Result Date: 12/11/2022 CLINICAL DATA:  Unwitnessed fall. EXAM: CT PELVIS WITHOUT CONTRAST TECHNIQUE: Multidetector CT imaging of the pelvis was performed following the standard protocol without intravenous contrast. RADIATION DOSE REDUCTION: This exam was performed according to the departmental dose-optimization program which includes automated exposure control, adjustment of the mA and/or kV according to patient size and/or use of iterative  reconstruction technique. COMPARISON:  None Available. FINDINGS: Urinary Tract:  No abnormality visualized. Bowel: There is no evidence of bowel dilatation. The appendix is not  identified. Numerous noninflamed diverticula are seen throughout the visualized portions of the large bowel. Vascular/Lymphatic: No pathologically enlarged lymph nodes. There is marked severity calcification of the visualized portion of the abdominal aorta and bilateral common iliac arteries. Reproductive:  The prostate gland is mildly enlarged. Other:  None. Musculoskeletal: Acute nondisplaced fracture deformity is seen involving the medial aspect of the right iliac bone. This extends to involve the superior and medial aspects of the right acetabulum. Additional fractures of the right superior and right inferior pubic rami are seen. A 7.7 cm x 2.8 cm x 5.9 cm hematoma is seen along the medial aspect of the right acetabulum, adjacent to the urinary bladder. IMPRESSION: 1. Acute nondisplaced fracture deformity involving the medial aspect of the right iliac bone which extends to involve the superior and medial aspects of the right acetabulum. 2. Additional fractures of the right superior and right inferior pubic rami. 3. 7.7 cm x 2.8 cm x 5.9 cm hematoma along the medial aspect of the right acetabulum, adjacent to the urinary bladder. 4. Colonic diverticulosis. 5. Aortic atherosclerosis. Aortic Atherosclerosis (ICD10-I70.0). Electronically Signed   By: Aram Candela M.D.   On: 12/11/2022 01:04   DG Chest Portable 1 View  Result Date: 12/11/2022 CLINICAL DATA:  Status post fall. EXAM: PORTABLE CHEST 1 VIEW COMPARISON:  February 09, 2014 FINDINGS: The heart size and mediastinal contours are within normal limits. Mild, diffuse, chronic appearing increased lung markings are seen. Mild atelectasis is noted within the left lung base. Small multifocal areas of atelectasis and/or infiltrate are also seen scattered throughout the right lung. A  moderate sized hiatal hernia is suspected within the retrocardiac region of the left lung base. No pleural effusion or pneumothorax is identified. A chronic fracture deformity of the left humeral head and neck is seen. No acute osseous abnormalities are identified. IMPRESSION: 1. Mild left basilar atelectasis. 2. Small multifocal areas of right lung atelectasis and/or infiltrate. 3. Findings suspicious for a moderate sized hiatal hernia. Correlation with nonemergent abdomen and pelvis CT is recommended. Electronically Signed   By: Aram Candela M.D.   On: 12/11/2022 00:56   DG Hip Unilat  With Pelvis 2-3 Views Right  Result Date: 12/10/2022 CLINICAL DATA:  Hip pain after a fall.  Slip and fall injury. EXAM: DG HIP (WITH OR WITHOUT PELVIS) 2-3V RIGHT COMPARISON:  None Available. FINDINGS: Mild degenerative changes in the lower lumbar spine and hips. The right hip appears intact. No evidence of acute fracture or dislocation. Fractures are demonstrated in the right superior and inferior pubic rami, likely acute. SI joints and symphysis pubis are not displaced. No focal bone lesion or bone destruction. Vascular calcifications. IMPRESSION: Fractures of the right superior and inferior pubic rami, probably acute. Otherwise, no acute fracture or dislocation of the right hip. Mild degenerative changes. Electronically Signed   By: Burman Nieves M.D.   On: 12/10/2022 23:59     Radiology CT PELVIS WO CONTRAST  Result Date: 12/11/2022 CLINICAL DATA:  Unwitnessed fall. EXAM: CT PELVIS WITHOUT CONTRAST TECHNIQUE: Multidetector CT imaging of the pelvis was performed following the standard protocol without intravenous contrast. RADIATION DOSE REDUCTION: This exam was performed according to the departmental dose-optimization program which includes automated exposure control, adjustment of the mA and/or kV according to patient size and/or use of iterative reconstruction technique. COMPARISON:  None Available.  FINDINGS: Urinary Tract:  No abnormality visualized. Bowel: There is no evidence of bowel dilatation. The appendix is not identified. Numerous noninflamed diverticula are seen  throughout the visualized portions of the large bowel. Vascular/Lymphatic: No pathologically enlarged lymph nodes. There is marked severity calcification of the visualized portion of the abdominal aorta and bilateral common iliac arteries. Reproductive:  The prostate gland is mildly enlarged. Other:  None. Musculoskeletal: Acute nondisplaced fracture deformity is seen involving the medial aspect of the right iliac bone. This extends to involve the superior and medial aspects of the right acetabulum. Additional fractures of the right superior and right inferior pubic rami are seen. A 7.7 cm x 2.8 cm x 5.9 cm hematoma is seen along the medial aspect of the right acetabulum, adjacent to the urinary bladder. IMPRESSION: 1. Acute nondisplaced fracture deformity involving the medial aspect of the right iliac bone which extends to involve the superior and medial aspects of the right acetabulum. 2. Additional fractures of the right superior and right inferior pubic rami. 3. 7.7 cm x 2.8 cm x 5.9 cm hematoma along the medial aspect of the right acetabulum, adjacent to the urinary bladder. 4. Colonic diverticulosis. 5. Aortic atherosclerosis. Aortic Atherosclerosis (ICD10-I70.0). Electronically Signed   By: Aram Candela M.D.   On: 12/11/2022 01:04   DG Chest Portable 1 View  Result Date: 12/11/2022 CLINICAL DATA:  Status post fall. EXAM: PORTABLE CHEST 1 VIEW COMPARISON:  February 09, 2014 FINDINGS: The heart size and mediastinal contours are within normal limits. Mild, diffuse, chronic appearing increased lung markings are seen. Mild atelectasis is noted within the left lung base. Small multifocal areas of atelectasis and/or infiltrate are also seen scattered throughout the right lung. A moderate sized hiatal hernia is suspected within the  retrocardiac region of the left lung base. No pleural effusion or pneumothorax is identified. A chronic fracture deformity of the left humeral head and neck is seen. No acute osseous abnormalities are identified. IMPRESSION: 1. Mild left basilar atelectasis. 2. Small multifocal areas of right lung atelectasis and/or infiltrate. 3. Findings suspicious for a moderate sized hiatal hernia. Correlation with nonemergent abdomen and pelvis CT is recommended. Electronically Signed   By: Aram Candela M.D.   On: 12/11/2022 00:56   DG Hip Unilat  With Pelvis 2-3 Views Right  Result Date: 12/10/2022 CLINICAL DATA:  Hip pain after a fall.  Slip and fall injury. EXAM: DG HIP (WITH OR WITHOUT PELVIS) 2-3V RIGHT COMPARISON:  None Available. FINDINGS: Mild degenerative changes in the lower lumbar spine and hips. The right hip appears intact. No evidence of acute fracture or dislocation. Fractures are demonstrated in the right superior and inferior pubic rami, likely acute. SI joints and symphysis pubis are not displaced. No focal bone lesion or bone destruction. Vascular calcifications. IMPRESSION: Fractures of the right superior and inferior pubic rami, probably acute. Otherwise, no acute fracture or dislocation of the right hip. Mild degenerative changes. Electronically Signed   By: Burman Nieves M.D.   On: 12/10/2022 23:59    Procedures Procedures    Medications Ordered in ED Medications  fentaNYL (SUBLIMAZE) injection 25 mcg (has no administration in time range)  fentaNYL (SUBLIMAZE) injection 25 mcg (25 mcg Intravenous Given 12/11/22 0031)  ondansetron (ZOFRAN) injection 4 mg (4 mg Intravenous Given 12/11/22 0121)    ED Course/ Medical Decision Making/ A&P                                 Medical Decision Making Patient fell at nursing home did not hit head, no LOC right hip pain  Amount and/or Complexity of Data Reviewed Independent Historian: EMS    Details: See above  External Data Reviewed:  labs and notes.    Details: Previous notes and labs reviewed  Labs: ordered.    Details: White count elevated 20.7, normal hemoglobin, elevated platelet count 778.  Normal sodium 135, normal potassium 4.5, elevated creatinine 1.4  Radiology: ordered and independent interpretation performed.    Details: Ramus fractures by me  ECG/medicine tests: ordered and independent interpretation performed. Decision-making details documented in ED Course. Discussion of management or test interpretation with external provider(s): Case d/w Dr. Aundria Rud, will add to the treatment team Case d/w Dr. Margo Aye who will admit  Risk Prescription drug management. Decision regarding hospitalization.    Final Clinical Impression(s) / ED Diagnoses Final diagnoses:  Fall, initial encounter  Leukocytosis, unspecified type  Thrombocytosis  Closed fracture of right inferior pubic ramus, initial encounter (HCC)  Closed nondisplaced fracture of right acetabulum, unspecified portion of acetabulum, initial encounter [facility])   The patient appears reasonably stabilized for admission considering the current resources, flow, and capabilities available in the ED at this time, and I doubt any other [facility] requiring further screening and/or treatment in the ED prior to admission.  Rx / DC Orders ED Discharge Orders     None         [place]aska Flett, MD 12/11/22 1610

## 2022-12-11 NOTE — H&P (Signed)
[facility] History and Physical  TARIF MCMANAWAY ZOX:[id number] DOB: Jun 11, 1932 DOA: 12/10/2022 PCP: [name], MD  Presented from: Assisted living facility Chief Complaint: Fall leading to fracture  History of Present Illness: [name] is a 89 y.o. male with PMH significant for DM2, HTN, HLD, CAD, , ?  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  He was unsure if he hit his head, did not pass out.  Not on blood thinners.  In the ED, patient was afebrile, hemodynamically stable, breathing on room air. Labs showed WC count elevated to 21, hemoglobin 16.5, platelets 778  BUN/creatinine 31/1.4 Urinalysis showed clear yellow urine without leukocytes X-ray of the hip showed fractures of the right superior and inferior pubic rami, probably acute.  Otherwise no acute fracture or dislocation of the hip. CT pelvis showed 1. Acute nondisplaced fracture deformity involving the medial aspect of the right iliac bone which extends to involve the superior and medial aspects of the right acetabulum. 2. Additional fractures of the right superior and right inferior pubic rami. 3. 7.7 cm x 2.8 cm x 5.9 cm hematoma along the medial aspect of the right acetabulum, adjacent to the urinary bladder.  Chest x-ray showed mild basilar atelectasis and a moderate-sized hiatal hernia  Patient was given IV pain meds. EDP discussed with orthopedic Dr. Aundria Rud Recommended transfer to [facility] for surgical intervention Hospital service was consulted for inpatient management  I received this patient as a carryover admission from last night.   Overnight, remains afebrile, hemodynamically stable,  breathing room air Repeat labs this morning with WBC count remains elevated at 20,000, hemoglobin slightly low at 15.5, platelet remains elevated at 698  At the time of my evaluation, patient was lying on bed.  Soft-spoken, slow to respond but able to give the details.  No family at bedside. Orthopedic PA Casimiro Needle was also at bedside.  No surgical intervention recommended History reviewed and detailed as above with patient.  Review of Systems:  All systems were reviewed and were negative unless otherwise mentioned in the HPI   Past medical history: Past Medical History:  Diagnosis Date   Chest pain    Coronary artery disease    Diabetes mellitus without complication (HCC)    Hyperlipidemia    Hypertension     Past surgical history: Past Surgical History:  Procedure Laterality Date   CARDIAC CATHETERIZATION  11/01/2005   patent stents with normal LV function   CARDIAC CATHETERIZATION  11/08/2003   cypher stent mid dominant right coronary lesion   CARDIAC SURGERY     CORONARY ANGIOPLASTY     post LAD and RCA stenting   DOPPLER ECHOCARDIOGRAPHY  06/18/2010   EF =>55%,LV normal   EYE SURGERY     holter monitor  11/08/2005   NM MYOCAR PERF WALL MOTION  05/23/2008   EF 62% ,norm myocardial perfusion    Social History:  reports that he has quit smoking. His smoking use included cigarettes. He quit smokeless tobacco use about 40 years ago. He reports current alcohol use. He reports that he does not use drugs.  Allergies:  No Known Allergies Patient has no known allergies.   Family history:  History reviewed. No pertinent  family history.   Physical Exam: Vitals:   12/10/22 2335 12/11/22 0545 12/11/22 0907 12/11/22 0932  BP: 139/80 112/66 108/77   Pulse: 72 85 99   Resp: 16 16 (!) 31   Temp: (!) 97.5 F (36.4 C) 98.5 F (36.9 C)  98.2 F (36.8 C)  TempSrc: Oral Oral    SpO2: 98% 95% 94%    Wt Readings from Last 3 Encounters:  10/01/21 86.2 kg  09/20/21 90 kg   02/21/21 90 kg   There is no height or weight on file to calculate BMI.  General exam: Pleasant, elderly Caucasian male.  Currently not in pain Skin: No rashes, lesions or ulcers. HEENT: Atraumatic, normocephalic, no obvious bleeding Lungs: Clear to auscultation bilaterally CVS: regular rate and rhythm, no murmur  GI/Abd soft, nontender, nondistended, bowel sound present CNS: Alert, awake, slow to respond but able to answer orientation questions Psychiatry: Sad affect Extremities: No pedal edema, no calf tenderness   ------------------------------------------------------------------------------------------------------ Assessment/Plan: Principal Problem:   Acetabulum fracture (HCC)  Right acetabular/iliac bone fracture Right superior and inferior pubic rami fracture Periacetabular hematoma Secondary to an unwitnessed fall Imagings with findings as above Orthopedics was consulted Initial plan was for surgical intervention.  After evaluation by orthopedics this morning, nonoperative management was recommended. Pain management with as needed oral and IV pain meds For DVT prophylaxis, I have ordered Lovenox to be started tomorrow morning only   CKD 3a Creatinine 1 year ago was around 1.3.  Currently remains close to baseline.  Continue to monitor Recent Labs    01/01/22 1255 12/11/22 0051  BUN 17 31*  CREATININE 1.32* 1.40*   Type 2 diabetes mellitus Hyperglycemia A1c 5.5 from 2014.  Update A1c PTA meds-glimepiride 4 mg daily Start SSI/Accu-Cheks Recent Labs  Lab 12/11/22 0818  GLUCAP 218*   Essential hypertension PTA meds- metoprolol 50 mg twice daily, losartan 100 mg daily, Lasix 40 mg daily as needed Echo from 2023 with EF 60 to 65%, grade 1 diastolic dysfunction Currently blood pressure is stable without meds.  May further drop if bleeding continues Continue monitor.  Resume BP meds as needed  CAD/HLD PTA meds- aspirin 81 mg daily, not on statin?? Keep  aspirin on hold because of hematoma around the fracture site.  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 Townsend [name] to be on hydroxyurea 5 mg daily.  I am not sure if he still needs this. No evidence of infection. Watch out for significant drop in hemoglobin preoperatively. Recent Labs  Lab 12/11/22 0030 12/11/22 0051 12/11/22 0858  WBC 20.7*  --  20.0*  NEUTROABS 16.5*  --   --   HGB 16.5 18.7* 15.5  HCT 52.5* 55.0* 48.1  MCV 91.9  --  91.3  PLT 778*  --  698*   Moderate hiatal hernia Seen in imaging, ??  Not on PPI  ?  Parkinson's disease Med list shows Sinemet twice daily.  Continue the same at this time  Goals of care   Code Status: Full Code    DVT prophylaxis:  enoxaparin (LOVENOX) injection 40 mg Start: 12/12/22 0800   Antimicrobials: None Fluid: None Consultants: Orthopedics Family Communication: I talked to patient's son [name]. His brother Gabriel Rung is on his way to the hospital. They have questions about surgical versus nonsurgical approach.  I have asked orthopedics PA Casimiro Needle to call the family.  Dispo: The patient is from: ALF              Anticipated d/c is to: Pending clinical course, pending PT eval  Diet: Diet Order             Diet regular Fluid consistency: Thin  Diet effective now                    ------------------------------------------------------------------------------------- Severity of Illness: The appropriate patient status for this patient is INPATIENT. Inpatient status is judged to be reasonable and necessary in order to provide the required intensity of service to ensure the patient's safety. The patient's presenting symptoms, physical exam findings, and initial radiographic and laboratory data in the context of their chronic comorbidities is felt to place them at high  risk for further clinical deterioration. Furthermore, it is not anticipated that the patient will be medically stable for discharge from the hospital within 2 midnights of admission.   * I certify that at the point of admission it is my clinical judgment that the patient will require inpatient hospital care spanning beyond 2 midnights from the point of admission due to high intensity of service, high risk for further deterioration and high frequency of surveillance required.* -------------------------------------------------------------------------------------   Home Meds: Prior to Admission medications   Medication Sig Start Date End Date Taking? Authorizing Provider  acetaminophen (TYLENOL) 650 MG CR tablet Take 650 mg by mouth every 8 (eight) hours as needed for pain.   Yes [provider]  ammonium lactate (LAC-HYDRIN) 12 % lotion 1 Application at bedtime. Apply to bilateral legs and feet; do not apply to right heel wound 08/05/22  Yes [provider]  ASPIRIN 81 PO Take 81 mg by mouth daily.   Yes [provider]  carbidopa-levodopa (SINEMET IR) 25-100 MG tablet Take 2 tablets by mouth 3 (three) times daily. At 8am, noon, 5pm Patient taking differently: Take 1 tablet by mouth at bedtime. 01/01/22  Yes Levert Feinstein, MD  carbidopa-levodopa (SINEMET IR) 25-100 MG tablet Take 2 tablets by mouth in the morning and at bedtime.   Yes [provider]  furosemide (LASIX) 40 MG tablet Take 1 tablet (40 mg total) by mouth daily. Patient taking differently: Take 40 mg by mouth as needed for edema. 06/11/19 07/16/23 Yes Runell Gess, MD  glimepiride (AMARYL) 4 MG tablet Take 4 mg by mouth daily with breakfast. 04/22/12  Yes [provider]  hydroxyurea (HYDREA) 500 MG capsule TAKE 1 CAPSULE BY MOUTH ONCE DAILY WITH FOOD 03/29/21  Yes Si Gaul, MD  losartan (COZAAR) 100 MG tablet Take 100 mg by mouth daily.   Yes [provider]  Menthol, Topical  Analgesic, (BIOFREEZE ROLL-ON) 4 % GEL Apply 1 Application topically every 4 (four) hours as needed. For mild/moderate pain   Yes [provider]  metoprolol tartrate (LOPRESSOR) 50 MG tablet Take 1 tablet by mouth twice daily 06/09/20  Yes Runell Gess, MD  Multiple Vitamin (MULTIVITAMIN WITH MINERALS) TABS tablet Take 1 tablet by mouth every morning.   Yes [provider]  nystatin (MYCOSTATIN/NYSTOP) powder Apply 1 Application topically 2 (two) times daily. Apply to groin  12/16/22 Yes [provider]  polyethylene glycol (MIRALAX / GLYCOLAX) 17 g packet Take 17 g by mouth daily.   Yes [provider]  PROSOURCE PROTEIN PO Take 1 Bottle by mouth daily. Med Plus 2.0   Yes [provider]  zinc oxide 20 % ointment Apply 1  Application topically 3 (three) times daily as needed for irritation. Apply to sacrum/coccyx every shift   Yes [provider]    Labs on Admission:   CBC: Recent Labs  Lab 12/11/22 0030 12/11/22 0051 12/11/22 0858  WBC 20.7*  --  20.0*  NEUTROABS 16.5*  --   --   HGB 16.5 18.7* 15.5  HCT 52.5* 55.0* 48.1  MCV 91.9  --  91.3  PLT 778*  --  698*    Basic Metabolic Panel: Recent Labs  Lab 12/11/22 0051  NA 135  K 4.5  CL 102  GLUCOSE 146*  BUN 31*  CREATININE 1.40*    Liver Function Tests: No results for input(s): "AST", "ALT", "ALKPHOS", "BILITOT", "PROT", "ALBUMIN" in the last 168 hours. No results for input(s): "LIPASE", "AMYLASE" in the last 168 hours. No results for input(s): "AMMONIA" in the last 168 hours.  Cardiac Enzymes: No results for input(s): "CKTOTAL", "CKMB", "CKMBINDEX", "TROPONINI" in the last 168 hours.  BNP (last 3 results) No results for input(s): "BNP" in the last 8760 hours.  ProBNP (last 3 results) No results for input(s): "PROBNP" in the last 8760 hours.  CBG: Recent Labs  Lab 12/11/22 0818  GLUCAP 218*    Lipase  No results found for: "LIPASE"    Urinalysis    Component Value Date/Time   COLORURINE YELLOW 12/11/2022 0711   APPEARANCEUR CLEAR 12/11/2022 0711   LABSPEC 1.016 12/11/2022 0711   PHURINE 5.0 12/11/2022 0711   GLUCOSEU NEGATIVE 12/11/2022 0711   HGBUR NEGATIVE 12/11/2022 0711   BILIRUBINUR NEGATIVE 12/11/2022 0711   KETONESUR 5 (A) 12/11/2022 0711   PROTEINUR NEGATIVE 12/11/2022 0711   UROBILINOGEN 0.2 06/17/2012 1346   NITRITE NEGATIVE 12/11/2022 0711   LEUKOCYTESUR SMALL (A) 12/11/2022 0711     Drugs of Abuse  No results found for: "LABOPIA", "COCAINSCRNUR", "LABBENZ", "AMPHETMU", "THCU", "LABBARB"    Radiological Exams on Admission: CT PELVIS WO CONTRAST  Result Date: 12/11/2022 CLINICAL DATA:  Unwitnessed fall. EXAM: CT PELVIS WITHOUT CONTRAST TECHNIQUE: Multidetector CT imaging of the pelvis was performed following the standard protocol without intravenous contrast. RADIATION DOSE REDUCTION: This exam was performed according to the departmental dose-optimization program which includes automated exposure control, adjustment of the mA and/or kV according to patient size and/or use of iterative reconstruction technique. COMPARISON:  None Available. FINDINGS: Urinary Tract:  No abnormality visualized. Bowel: There is no evidence of bowel dilatation. The appendix is not identified. Numerous noninflamed diverticula are seen throughout the visualized portions of the large bowel. Vascular/Lymphatic: No pathologically enlarged lymph nodes. There is marked severity calcification of the visualized portion of the abdominal aorta and bilateral common iliac arteries. Reproductive:  The prostate gland is mildly enlarged. Other:  None. Musculoskeletal: Acute nondisplaced fracture deformity is seen involving the medial aspect of the right iliac bone. This extends to involve the superior and medial aspects of the right acetabulum. Additional fractures of the right superior and right inferior pubic rami are seen. A 7.7 cm x 2.8 cm x  5.9 cm hematoma is seen along the medial aspect of the right acetabulum, adjacent to the urinary bladder. IMPRESSION: 1. Acute nondisplaced fracture deformity involving the medial aspect of the right iliac bone which extends to involve the superior and medial aspects of the right acetabulum. 2. Additional fractures of the right superior and right inferior pubic rami. 3. 7.7 cm x 2.8 cm x 5.9 cm hematoma along the medial aspect of the right acetabulum, adjacent to the urinary bladder. 4.  Colonic diverticulosis. 5. Aortic atherosclerosis. Aortic Atherosclerosis (ICD10-I70.0). Electronically Signed   By: Aram Candela M.D.   On: 12/11/2022 01:04   DG Chest Portable 1 View  Result Date: 12/11/2022 CLINICAL DATA:  Status post fall. EXAM: PORTABLE CHEST 1 VIEW COMPARISON:  February 09, 2014 FINDINGS: The heart size and mediastinal contours are within normal limits. Mild, diffuse, chronic appearing increased lung markings are seen. Mild atelectasis is noted within the left lung base. Small multifocal areas of atelectasis and/or infiltrate are also seen scattered throughout the right lung. A moderate sized hiatal hernia is suspected within the retrocardiac region of the left lung base. No pleural effusion or pneumothorax is identified. A chronic fracture deformity of the left humeral head and neck is seen. No acute osseous abnormalities are identified. IMPRESSION: 1. Mild left basilar atelectasis. 2. Small multifocal areas of right lung atelectasis and/or infiltrate. 3. Findings suspicious for a moderate sized hiatal hernia. Correlation with nonemergent abdomen and pelvis CT is recommended. Electronically Signed   By: Aram Candela M.D.   On: 12/11/2022 00:56   DG Hip Unilat  With Pelvis 2-3 Views Right  Result Date: 12/10/2022 CLINICAL DATA:  Hip pain after a fall.  Slip and fall injury. EXAM: DG HIP (WITH OR WITHOUT PELVIS) 2-3V RIGHT COMPARISON:  None Available. FINDINGS: Mild degenerative changes in the  lower lumbar spine and hips. The right hip appears intact. No evidence of acute fracture or dislocation. Fractures are demonstrated in the right superior and inferior pubic rami, likely acute. SI joints and symphysis pubis are not displaced. No focal bone lesion or bone destruction. Vascular calcifications. IMPRESSION: Fractures of the right superior and inferior pubic rami, probably acute. Otherwise, no acute fracture or dislocation of the right hip. Mild degenerative changes. Electronically Signed   By: Burman Nieves M.D.   On: 12/10/2022 23:59     Signed, Lorin Glass, MD [facility] 12/11/2022

## 2022-12-11 NOTE — Consult Note (Signed)
Reason for Consult:Right acetabulum fx Referring Physician: Melina Schools Dahal Time called: 0920 Time at bedside: 1004   [name] is an 89 y.o. male.  HPI: [name] [facility] the [facility]. He c/o right hip and buttock pain and was brought to the ED. X-rays showed a right acetabulum fx and orthopedic surgery was consulted. He usually gets around in a WC and rarely ambulates.  Past Medical History:  Diagnosis Date   Chest pain    Coronary artery disease    Diabetes mellitus without complication (HCC)    Hyperlipidemia    Hypertension     Past Surgical History:  Procedure Laterality Date   CARDIAC CATHETERIZATION  11/01/2005   patent stents with normal LV function   CARDIAC CATHETERIZATION  11/08/2003   cypher stent mid dominant right coronary lesion   CARDIAC SURGERY     CORONARY ANGIOPLASTY     post LAD and RCA stenting   DOPPLER ECHOCARDIOGRAPHY  06/18/2010   EF =>55%,LV normal   EYE SURGERY     holter monitor  11/08/2005   NM MYOCAR PERF WALL MOTION  05/23/2008   EF 62% ,norm myocardial perfusion    History reviewed. No pertinent family history.  Social History:  reports that he has quit smoking. His smoking use included cigarettes. He quit smokeless tobacco use about 40 years ago. He reports current alcohol use. He reports that he does not use drugs.  Allergies: No Known Allergies  Medications: I have reviewed the patient's current medications.  Results for orders placed or performed during the hospital encounter of 12/10/22 (from the past 48 hour(s))  CBC with Differential     Status: Abnormal   Collection Time: 12/11/22 12:30 AM  Result Value Ref Range   WBC 20.7 (H) 4.0 - 10.5 K/uL   RBC 5.71 4.22 - 5.81 MIL/uL   Hemoglobin 16.5 13.0 - 17.0 g/dL   HCT 16.1 (H) 09.6 - 04.5 %   MCV 91.9 80.0 - 100.0 fL   MCH 28.9 26.0 - 34.0 pg   MCHC 31.4 30.0 - 36.0 g/dL   RDW 40.9 (H) 81.1 - 91.4 %   Platelets 778 (H) 150 - 400 K/uL   nRBC 0.0 0.0 - 0.2 %    Neutrophils Relative % 79 %   Neutro Abs 16.5 (H) 1.7 - 7.7 K/uL   Lymphocytes Relative 9 %   Lymphs Abs 1.9 0.7 - 4.0 K/uL   Monocytes Relative 7 %   Monocytes Absolute 1.4 (H) 0.1 - 1.0 K/uL   Eosinophils Relative 2 %   Eosinophils Absolute 0.3 0.0 - 0.5 K/uL   Basophils Relative 1 %   Basophils Absolute 0.2 (H) 0.0 - 0.1 K/uL   Immature Granulocytes 2 %   Abs Immature Granulocytes 0.37 (H) 0.00 - 0.07 K/uL    Comment: Performed at [facility], [address], [place], [place] [address]  I-stat chem 8, ED (not at [facility] or [facility])     Status: Abnormal   Collection Time: 12/11/22 12:51 AM  Result Value Ref Range   Sodium 135 135 - 145 mmol/L   Potassium 4.5 3.5 - 5.1 mmol/L   Chloride 102 98 - 111 mmol/L   BUN 31 (H) 8 - 23 mg/dL   Creatinine, Ser 6.21 (H) 0.61 - 1.24 mg/dL   Glucose, Bld 308 (H) 70 - 99 mg/dL    Comment: Glucose reference range applies only to samples taken after fasting for at least 8 hours.  Calcium, Ion 1.25 1.15 - 1.40 mmol/L   TCO2 25 22 - 32 mmol/L   Hemoglobin 18.7 (H) 13.0 - 17.0 g/dL   HCT 78.4 (H) 69.6 - 29.5 %  Urinalysis, Routine w reflex microscopic -Urine, Clean Catch     Status: Abnormal   Collection Time: 12/11/22  7:11 AM  Result Value Ref Range   Color, Urine YELLOW YELLOW   APPearance CLEAR CLEAR   Specific Gravity, Urine 1.016 1.005 - 1.030   pH 5.0 5.0 - 8.0   Glucose, UA NEGATIVE NEGATIVE mg/dL   Hgb urine dipstick NEGATIVE NEGATIVE   Bilirubin Urine NEGATIVE NEGATIVE   Ketones, ur 5 (A) NEGATIVE mg/dL   Protein, ur NEGATIVE NEGATIVE mg/dL   Nitrite NEGATIVE NEGATIVE   Leukocytes,Ua SMALL (A) NEGATIVE   RBC / HPF 0-5 0 - 5 RBC/hpf   WBC, UA 21-50 0 - 5 WBC/hpf   Bacteria, UA NONE SEEN NONE SEEN   Squamous Epithelial / HPF 0-5 0 - 5 /HPF   Mucus PRESENT     Comment: Performed at [facility], [address], [place], [place] [address]  CBG monitoring, ED     Status: Abnormal   Collection Time: 12/11/22  8:18 AM   Result Value Ref Range   Glucose-Capillary 218 (H) 70 - 99 mg/dL    Comment: Glucose reference range applies only to samples taken after fasting for at least 8 hours.  CBC     Status: Abnormal   Collection Time: 12/11/22  8:58 AM  Result Value Ref Range   WBC 20.0 (H) 4.0 - 10.5 K/uL   RBC 5.27 4.22 - 5.81 MIL/uL   Hemoglobin 15.5 13.0 - 17.0 g/dL   HCT 24.4 01.0 - 27.2 %   MCV 91.3 80.0 - 100.0 fL   MCH 29.4 26.0 - 34.0 pg   MCHC 32.2 30.0 - 36.0 g/dL   RDW 53.6 (H) 64.4 - 03.4 %   Platelets 698 (H) 150 - 400 K/uL    Comment: REPEATED TO VERIFY   nRBC 0.1 0.0 - 0.2 %    Comment: Performed at [facility], [address] [place], [place] [address]    CT PELVIS WO CONTRAST  Result Date: 12/11/2022 CLINICAL DATA:  Unwitnessed fall. EXAM: CT PELVIS WITHOUT CONTRAST TECHNIQUE: Multidetector CT imaging of the pelvis was performed following the standard protocol without intravenous contrast. RADIATION DOSE REDUCTION: This exam was performed according to the departmental dose-optimization program which includes automated exposure control, adjustment of the mA and/or kV according to patient size and/or use of iterative reconstruction technique. COMPARISON:  None Available. FINDINGS: Urinary Tract:  No abnormality visualized. Bowel: There is no evidence of bowel dilatation. The appendix is not identified. Numerous noninflamed diverticula are seen throughout the visualized portions of the large bowel. Vascular/Lymphatic: No pathologically enlarged lymph nodes. There is marked severity calcification of the visualized portion of the abdominal aorta and bilateral common iliac arteries. Reproductive:  The prostate gland is mildly enlarged. Other:  None. Musculoskeletal: Acute nondisplaced fracture deformity is seen involving the medial aspect of the right iliac bone. This extends to involve the superior and medial aspects of the right acetabulum. Additional fractures of the right superior and right  inferior pubic rami are seen. A 7.7 cm x 2.8 cm x 5.9 cm hematoma is seen along the medial aspect of the right acetabulum, adjacent to the urinary bladder. IMPRESSION: 1. Acute nondisplaced fracture deformity involving the medial aspect of the right iliac bone  which extends to involve the superior and medial aspects of the right acetabulum. 2. Additional fractures of the right superior and right inferior pubic rami. 3. 7.7 cm x 2.8 cm x 5.9 cm hematoma along the medial aspect of the right acetabulum, adjacent to the urinary bladder. 4. Colonic diverticulosis. 5. Aortic atherosclerosis. Aortic Atherosclerosis (ICD10-I70.0). Electronically Signed   By: Aram Candela M.D.   On: 12/11/2022 01:04   DG Chest Portable 1 View  Result Date: 12/11/2022 CLINICAL DATA:  Status post fall. EXAM: PORTABLE CHEST 1 VIEW COMPARISON:  February 09, 2014 FINDINGS: The heart size and mediastinal contours are within normal limits. Mild, diffuse, chronic appearing increased lung markings are seen. Mild atelectasis is noted within the left lung base. Small multifocal areas of atelectasis and/or infiltrate are also seen scattered throughout the right lung. A moderate sized hiatal hernia is suspected within the retrocardiac region of the left lung base. No pleural effusion or pneumothorax is identified. A chronic fracture deformity of the left humeral head and neck is seen. No acute osseous abnormalities are identified. IMPRESSION: 1. Mild left basilar atelectasis. 2. Small multifocal areas of right lung atelectasis and/or infiltrate. 3. Findings suspicious for a moderate sized hiatal hernia. Correlation with nonemergent abdomen and pelvis CT is recommended. Electronically Signed   By: Aram Candela M.D.   On: 12/11/2022 00:56   DG Hip Unilat  With Pelvis 2-3 Views Right  Result Date: 12/10/2022 CLINICAL DATA:  Hip pain after a fall.  Slip and fall injury. EXAM: DG HIP (WITH OR WITHOUT PELVIS) 2-3V RIGHT COMPARISON:  None  Available. FINDINGS: Mild degenerative changes in the lower lumbar spine and hips. The right hip appears intact. No evidence of acute fracture or dislocation. Fractures are demonstrated in the right superior and inferior pubic rami, likely acute. SI joints and symphysis pubis are not displaced. No focal bone lesion or bone destruction. Vascular calcifications. IMPRESSION: Fractures of the right superior and inferior pubic rami, probably acute. Otherwise, no acute fracture or dislocation of the right hip. Mild degenerative changes. Electronically Signed   By: Burman Nieves M.D.   On: 12/10/2022 23:59    Review of Systems  HENT:  Negative for ear discharge, ear pain, hearing loss and tinnitus.   Eyes:  Negative for photophobia and pain.  Respiratory:  Negative for cough and shortness of breath.   Cardiovascular:  Negative for chest pain.  Gastrointestinal:  Negative for abdominal pain, nausea and vomiting.  Genitourinary:  Negative for dysuria, flank pain, frequency and urgency.  Musculoskeletal:  Positive for arthralgias (Right hip). Negative for back pain, myalgias and neck pain.  Neurological:  Negative for dizziness and headaches.  Hematological:  Does not bruise/bleed easily.  Psychiatric/Behavioral:  The patient is not nervous/anxious.    Blood pressure 108/77, pulse 99, temperature 98.2 F (36.8 C), resp. rate (!) 31, SpO2 94%. Physical Exam Constitutional:      General: He is not in acute distress.    Appearance: He is well-developed. He is not diaphoretic.  HENT:     Head: Normocephalic and atraumatic.  Eyes:     General: No scleral icterus.       Right eye: No discharge.        Left eye: No discharge.     Conjunctiva/sclera: Conjunctivae normal.  Cardiovascular:     Rate and Rhythm: Normal rate and regular rhythm.  Pulmonary:     Effort: Pulmonary effort is normal. No respiratory distress.  Musculoskeletal:     Cervical  back: Normal range of motion.     Comments: LLE No  traumatic wounds, ecchymosis, or rash  Minimal TTP hip  No knee or ankle effusion  Knee stable to varus/ valgus and anterior/posterior stress  Sens DPN, SPN, TN intact  Motor EHL, ext, flex, evers 5/5  DP 2+, PT 2+, No significant edema  Skin:    General: Skin is warm and dry.  Neurological:     Mental Status: He is alert.  Psychiatric:        Mood and Affect: Mood normal.        Behavior: Behavior normal.     Assessment/Plan: Right acetabulum fx -- Will plan on initial non-operative management with [facility] for transfers. F/u with Dr. Jena Gauss in 2 weeks.    Freeman Caldron, PA-C Orthopedic Surgery [phone] 12/11/2022, 10:08 AM

## 2022-12-11 NOTE — ED Notes (Signed)
Pt unable to swallow water. Pt gargled the war

## 2022-12-11 NOTE — Evaluation (Signed)
Clinical/Bedside Swallow Evaluation Patient Details  Name: [name] MRN: [id number] Date of Birth: 1932/11/15  Today's Date: 12/11/2022 Time: SLP Start Time (ACUTE ONLY): 1645 SLP Stop Time (ACUTE ONLY): 1655 SLP Time Calculation (min) (ACUTE ONLY): 10 min  Past Medical History:  Past Medical History:  Diagnosis Date   Chest pain    Coronary artery disease    Diabetes mellitus without complication (HCC)    Hyperlipidemia    Hypertension    Past Surgical History:  Past Surgical History:  Procedure Laterality Date   CARDIAC CATHETERIZATION  11/01/2005   patent stents with normal LV function   CARDIAC CATHETERIZATION  11/08/2003   cypher stent mid dominant right coronary lesion   CARDIAC SURGERY     CORONARY ANGIOPLASTY     post LAD and RCA stenting   DOPPLER ECHOCARDIOGRAPHY  06/18/2010   EF =>55%,LV normal   EYE SURGERY     holter monitor  11/08/2005   NM MYOCAR PERF WALL MOTION  05/23/2008   EF 62% ,norm myocardial perfusion   HPI:  [name] is an 89 yo male presenting to [facility] ED 12/4 from ALF after a mechanical fall with immediate R hip pain. Transferred to [facility] for potential surgical intervention. Found to have R acetabular/iliac bone fx, R superior and inferior pubic rami fx, and periacetabular hematoma with ortho recommending non-operative management.. CXR with mild basilar atelectasis and moderate R sized hiatal hernia. 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 Oral motor exam WFL. Trials of thin liquids and Dys 3 solids withouth signs clinically concerning for aspiration. His dentures are loose and cause prolonged mastication that he states is baseline. Recommend continuing current diet of Dys 3 texture solids with thin liquids. SLP  will f/u briefly to ensure current diet remains appropriate. SLP Visit Diagnosis: Dysphagia, unspecified (R13.10)    Aspiration Risk  Mild aspiration risk    Diet Recommendation Dysphagia 3 (Mech soft);Thin liquid    Liquid Administration via: Cup;Straw Medication Administration: Whole meds with puree Supervision: Patient able to self feed;Intermittent supervision to cue for compensatory strategies Compensations: Slow rate;Small sips/bites Postural Changes: Seated upright at 90 degrees    Other  Recommendations Oral Care Recommendations: Oral care BID    Recommendations for follow up therapy are one component of a multi-disciplinary discharge planning process, led by the attending physician.  Recommendations may be updated based on patient status, additional functional criteria and insurance authorization.  Follow up Recommendations No SLP follow up      Assistance Recommended at Discharge    Functional Status Assessment Patient has had a recent decline in their functional status and demonstrates the ability to make significant improvements in function in a reasonable and predictable amount of time.  Frequency and Duration min 2x/week  1 week       Prognosis Prognosis for improved oropharyngeal function: Good Barriers to Reach Goals: Time post onset      Swallow Study   General HPI: [name] is an 89 yo male presenting to [facility] ED 12/4 from ALF after a mechanical fall with immediate R hip pain. Transferred to [facility] for potential surgical intervention. Found to have R acetabular/iliac bone fx, R superior and inferior pubic rami fx, and periacetabular hematoma with ortho recommending non-operative management.Marland Kitchen CXR  with mild basilar atelectasis and moderate R sized hiatal hernia. 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 Type of Study: Bedside Swallow Evaluation Previous Swallow Assessment: none in  chart Diet Prior to this Study: Dysphagia 3 (mechanical soft);Thin liquids (Level 0) Temperature Spikes Noted: No Respiratory Status: Room air History of Recent Intubation: No Behavior/Cognition: Alert;Cooperative Oral Cavity Assessment: Within Functional Limits Oral Care Completed by SLP: No Oral Cavity - Dentition: Dentures, top;Dentures, bottom Vision: Functional for self-feeding Self-Feeding Abilities: Able to feed self Patient Positioning: Postural control adequate for testing Baseline Vocal Quality: Normal Volitional Cough: Strong Volitional Swallow: Able to elicit    Oral/Motor/Sensory Function Overall Oral Motor/Sensory Function: Within functional limits   Ice Chips Ice chips: Not tested   Thin Liquid Thin Liquid: Within functional limits Presentation: Cup    Nectar Thick Nectar Thick Liquid: Not tested   Honey Thick Honey Thick Liquid: Not tested   Puree Puree: Not tested   Solid     Solid: Impaired Presentation: Self Fed Oral Phase Impairments: Impaired mastication      Gwynneth Aliment, M.A., CF-SLP Speech Language Pathology, Acute Rehabilitation Services  Secure Chat preferred [phone]  12/11/2022,5:21 PM

## 2022-12-12 DIAGNOSIS — W19XXXA Unspecified fall, initial encounter: Secondary | ICD-10-CM | POA: Diagnosis not present

## 2022-12-12 DIAGNOSIS — S32591A Other specified fracture of right pubis, initial encounter for closed fracture: Secondary | ICD-10-CM

## 2022-12-12 DIAGNOSIS — D75839 Thrombocytosis, unspecified: Secondary | ICD-10-CM

## 2022-12-12 DIAGNOSIS — D72829 Elevated white blood cell count, unspecified: Secondary | ICD-10-CM | POA: Diagnosis not present

## 2022-12-12 DIAGNOSIS — S32401A Unspecified fracture of right acetabulum, initial encounter for closed fracture: Secondary | ICD-10-CM

## 2022-12-12 LAB — CBC
HCT: 50 % (ref 39.0–52.0)
Hemoglobin: 16 g/dL (ref 13.0–17.0)
MCH: 29 pg (ref 26.0–34.0)
MCHC: 32 g/dL (ref 30.0–36.0)
MCV: 90.6 fL (ref 80.0–100.0)
Platelets: 812 10*3/uL — ABNORMAL HIGH (ref 150–400)
RBC: 5.52 MIL/uL (ref 4.22–5.81)
RDW: 17.3 % — ABNORMAL HIGH (ref 11.5–15.5)
WBC: 19.8 10*3/uL — ABNORMAL HIGH (ref 4.0–10.5)
nRBC: 0 % (ref 0.0–0.2)

## 2022-12-12 LAB — BASIC METABOLIC PANEL
Anion gap: 9 (ref 5–15)
BUN: 34 mg/dL — ABNORMAL HIGH (ref 8–23)
CO2: 22 mmol/L (ref 22–32)
Calcium: 9.4 mg/dL (ref 8.9–10.3)
Chloride: 100 mmol/L (ref 98–111)
Creatinine, Ser: 1.41 mg/dL — ABNORMAL HIGH (ref 0.61–1.24)
GFR, Estimated: 48 mL/min — ABNORMAL LOW (ref >60)
Glucose, Bld: 111 mg/dL — ABNORMAL HIGH (ref 70–99)
Potassium: 4.6 mmol/L (ref 3.5–5.1)
Sodium: 131 mmol/L — ABNORMAL LOW (ref 135–145)

## 2022-12-12 LAB — GLUCOSE, CAPILLARY
Glucose-Capillary: 124 mg/dL — ABNORMAL HIGH (ref 70–99)
Glucose-Capillary: 129 mg/dL — ABNORMAL HIGH (ref 70–99)
Glucose-Capillary: 181 mg/dL — ABNORMAL HIGH (ref 70–99)
Glucose-Capillary: 251 mg/dL — ABNORMAL HIGH (ref 70–99)
Glucose-Capillary: 192 mg/dL — ABNORMAL HIGH (ref 70–99)
Glucose-Capillary: 174 mg/dL — ABNORMAL HIGH (ref 70–99)

## 2022-12-12 LAB — HEMOGLOBIN A1C
Hgb A1c MFr Bld: 6.9 % — ABNORMAL HIGH (ref 4.8–5.6)
Mean Plasma Glucose: 151.33 mg/dL

## 2022-12-12 MED ORDER — INSULIN ASPART 100 UNIT/ML IJ SOLN: 0.0000 [IU] | Freq: Every day | INTRAMUSCULAR | Status: DC

## 2022-12-12 MED ORDER — PANTOPRAZOLE SODIUM 40 MG PO TBEC
40.0000 mg | DELAYED_RELEASE_TABLET | Freq: Every day | ORAL | Status: DC
  Administered 2022-12-12 – 2022-12-16 (×5): 40 mg via ORAL
  Filled 2022-12-12 (×5): qty 1

## 2022-12-12 MED ORDER — METOPROLOL TARTRATE 50 MG PO TABS: 50.0000 mg | ORAL_TABLET | Freq: Two times a day (BID) | ORAL | Status: DC

## 2022-12-12 MED ORDER — CARBIDOPA-LEVODOPA 25-100 MG PO TABS
2.0000 | ORAL_TABLET | Freq: Two times a day (BID) | ORAL | Status: DC
  Administered 2022-12-12 – 2022-12-16 (×8): 2 via ORAL
  Filled 2022-12-12 (×8): qty 2

## 2022-12-12 MED ORDER — METOPROLOL TARTRATE 25 MG PO TABS
25.0000 mg | ORAL_TABLET | Freq: Two times a day (BID) | ORAL | Status: DC
  Administered 2022-12-12 – 2022-12-16 (×9): 25 mg via ORAL
  Filled 2022-12-12 (×9): qty 1

## 2022-12-12 MED ORDER — INSULIN ASPART 100 UNIT/ML IJ SOLN
0.0000 [IU] | Freq: Three times a day (TID) | INTRAMUSCULAR | Status: DC
  Administered 2022-12-12: 2 [IU] via SUBCUTANEOUS
  Administered 2022-12-12: 5 [IU] via SUBCUTANEOUS
  Administered 2022-12-13 (×2): 3 [IU] via SUBCUTANEOUS
  Administered 2022-12-13: 2 [IU] via SUBCUTANEOUS

## 2022-12-12 MED ORDER — PROSOURCE PROTEIN PO LIQD: Freq: Every day | ORAL | Status: DC

## 2022-12-12 MED ORDER — POLYETHYLENE GLYCOL 3350 17 G PO PACK
17.0000 g | PACK | Freq: Every day | ORAL | Status: DC
  Administered 2022-12-12 – 2022-12-16 (×5): 17 g via ORAL
  Filled 2022-12-12 (×5): qty 1

## 2022-12-12 MED ORDER — HYDROXYUREA 500 MG PO CAPS
500.0000 mg | ORAL_CAPSULE | Freq: Every day | ORAL | Status: DC
  Administered 2022-12-12 – 2022-12-16 (×5): 500 mg via ORAL
  Filled 2022-12-12 (×6): qty 1

## 2022-12-12 MED ORDER — CARBIDOPA-LEVODOPA 25-100 MG PO TABS: 2.0000 | ORAL_TABLET | ORAL | Status: DC

## 2022-12-12 NOTE — Plan of Care (Signed)
  Problem: Coping: Goal: Ability to adjust to condition or change in health will improve Outcome: Progressing   Problem: Health Behavior/Discharge Planning: Goal: Ability to manage health-related needs will improve Outcome: Progressing   Problem: Metabolic: Goal: Ability to maintain appropriate glucose levels will improve Outcome: Progressing   Problem: Pain Management: Goal: General experience of comfort will improve Outcome: Progressing

## 2022-12-12 NOTE — Evaluation (Signed)
Occupational Therapy Evaluation Patient Details Name: [name] MRN: [id number] DOB: 02-Jun-1932 Today's Date: 12/12/2022   History of Present Illness Pt is an 89 y/o male who presents s/p fall at ALF. CT revealed nondisplaced fracture deformity involving the medial aspect of the right iliac bone which extends to involve the superior and medial aspect of the right S2 block.  Additional fractures of the right superior and right inferior pubic rami.  7.7 cmx 2.8 cm x5.9 cm hematoma. Ortho evaluated and recommended non-op management with WBAT RLE for transfers only, and NWB RLE for ambulation. PMH significant for CAD, DM, HTN.   Clinical Impression   PTA patient reports using w/c and RW for mobility, ADLs with some assist from ALF staff. Admitted for above and presents with problem list below.  He requires max assist to reposition in recliner, sitting unsupported for <1 minute prior to needing to lean back into chair.  Setup to total assist for ADLs.  Limited due to pain and fatigue today.  Based on performance today, believe patient will best benefit from continued OT services acutely and after dc at inpatient setting with <3hrs/day to optimize independence, safety with ADLs and mobility.        If plan is discharge home, recommend the following: Two people to help with walking and/or transfers;Two people to help with bathing/dressing/bathroom    Functional Status Assessment  Patient has had a recent decline in their functional status and demonstrates the ability to make significant improvements in function in a reasonable and predictable amount of time.  Equipment Recommendations  Other (comment) (defer)    Recommendations for Other Services       Precautions / Restrictions Precautions Precautions: Fall Precaution Comments: Premedicate for pain Restrictions Weight Bearing Restrictions: Yes RLE Weight Bearing: Weight bearing as tolerated (Transfers only; NWB for ambulation)       Mobility Bed Mobility               General bed mobility comments: OOB in recliner    Transfers Overall transfer level: Needs assistance                 General transfer comment: deferred, pt with increased pain and resistance when coming forward to sit upright in recliner for <1 minute and pushing posteriorly, needs +2 assist      Balance Overall balance assessment: Needs assistance, Mild deficits observed, not formally tested Sitting-balance support: Feet supported, No upper extremity supported Sitting balance-Leahy Scale: Poor Sitting balance - Comments: leans to the R sitting unsupported, posterior lean with poor tolerance for upright Postural control: Right lateral lean, Posterior lean                                 ADL either performed or assessed with clinical judgement   ADL Overall ADL's : Needs assistance/impaired     Grooming: Set up;Sitting           Upper Body Dressing : Minimal assistance;Sitting   Lower Body Dressing: Total assistance;Sitting/lateral leans     Toilet Transfer Details (indicate cue type and reason): deferred           General ADL Comments: pt up in recliner upon entry, repositined with total assist and requires max assist to pull forward to sit unsupported for <1 minute     Vision   Vision Assessment?: No apparent visual deficits     Perception  Praxis         Pertinent Vitals/Pain Pain Assessment Pain Assessment: Faces Faces Pain Scale: Hurts even more Pain Location: R pelvis/hip area with movement Pain Descriptors / Indicators: Sore, Discomfort, Grimacing, Guarding Pain Intervention(s): Limited activity within patient's tolerance, Monitored during session, Repositioned     Extremity/Trunk Assessment Upper Extremity Assessment Upper Extremity Assessment: Generalized weakness;Right hand dominant   Lower Extremity Assessment Lower Extremity Assessment: Defer to PT evaluation RLE  Deficits / Details: Acute pain, decreased strength and AROM consistent with above mentioned injury.   Cervical / Trunk Assessment Cervical / Trunk Assessment: Kyphotic   Communication Communication Communication: Difficulty following commands/understanding Following commands: Follows one step commands consistently;Follows one step commands with increased time Cueing Techniques: Verbal cues;Tactile cues   Cognition Arousal: Alert Behavior During Therapy: Flat affect (but trying to joke/be sarcastic at times) Overall Cognitive Status: Impaired/Different from baseline Area of Impairment: Attention, Following commands, Safety/judgement, Awareness, Problem solving, Memory                   Current Attention Level: Sustained Memory: Decreased short-term memory Following Commands: Follows one step commands consistently, Follows one step commands with increased time (and repetition of cue) Safety/Judgement: Decreased awareness of safety Awareness: Emergent Problem Solving: Slow processing, Decreased initiation, Requires verbal cues, Requires tactile cues, Difficulty sequencing General Comments: Pt with delayed responses at times. Internally distracted by anticipation of pain at times. Able to follow simple commands with increased time, oriented.     General Comments  son at side and supportive    Exercises     Shoulder Instructions      Home Living Family/patient expects to be discharged to:: Assisted living Living Arrangements: Alone                 Bathroom Shower/Tub: Walk-in shower (Roll in to a shower outside of his room)         Home Equipment: Agricultural consultant (2 wheels)          Prior Functioning/Environment Prior Level of Function : Needs assist             Mobility Comments: Uses a walker with a chair follow ADLs Comments: Staff assists with some ADLs, pt reports able to dress without assist but when questioned further reports staff assist with LB  dressing after bath; does report managing toileting on his own        OT Problem List: Decreased strength;Decreased activity tolerance;Impaired balance (sitting and/or standing);Decreased cognition;Decreased safety awareness;Decreased knowledge of use of DME or AE;Decreased knowledge of precautions;Pain      OT Treatment/Interventions: Self-care/ADL training;Therapeutic exercise;DME and/or AE instruction;Therapeutic activities;Patient/family education;Balance training    OT Goals(Current goals can be found in the care plan section) Acute Rehab OT Goals Patient Stated Goal: less pain OT Goal Formulation: With patient Time For Goal Achievement: 12/26/22 Potential to Achieve Goals: Fair ADL Goals Pt Will Perform Grooming: with set-up;sitting Pt Will Transfer to Toilet: with mod assist;with +2 assist;bedside commode Additional ADL Goal #1: Pt will complete bed mobility with mod assist as precursor to ADLs. Additional ADL Goal #2: Pt will maintain sitting balance at EOB for 10 minutes during ADLs with no more than min guard support.  OT Frequency: Min 1X/week    Co-evaluation              AM-PAC OT "6 Clicks" Daily Activity     Outcome Measure Help from another person eating meals?: A Little Help from another person taking care  of personal grooming?: A Little Help from another person toileting, which includes using toliet, bedpan, or urinal?: Total Help from another person bathing (including washing, rinsing, drying)?: A Lot Help from another person to put on and taking off regular upper body clothing?: A Lot Help from another person to put on and taking off regular lower body clothing?: Total 6 Click Score: 12   End of Session Nurse Communication: Mobility status  Activity Tolerance: Patient limited by fatigue;Patient limited by pain Patient left: in chair;with call bell/phone within reach;with chair alarm set;with family/visitor present  OT Visit Diagnosis: Other  abnormalities of gait and mobility (R26.89);Muscle weakness (generalized) (M62.81);Pain;History of falling (Z91.81);Other symptoms and signs involving cognitive function Pain - Right/Left: Right Pain - part of body: Hip                Time: 9562-1308 OT Time Calculation (min): 20 min Charges:  OT General Charges $OT Visit: 1 Visit OT Evaluation $OT Eval Moderate Complexity: 1 Mod  Barry Brunner, OT Acute Rehabilitation Services Office [phone]   Chancy Milroy 12/12/2022, 12:21 PM

## 2022-12-12 NOTE — Progress Notes (Signed)
[facility]                                                                              [name], is a 89 y.o. 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  On 12/3, patient was brought to ED at [facility] by EMS after on denies fall leading to immediate right hip pain.  Patient had repeat to that he was trying to ambulate when he slipped and fell, unsure if he hit his head, did not pass out. In ED, afebrile, stable. X-ray of the hip showed fracture of the right superior and inferior pubic rami CT pelvis showed acute nondisplaced fracture deformity involving the medial aspect of the right iliac bone which extends to involve the superior and medial aspect of the right S2 block.  Additional fractures of the right superior and right inferior pubic rami.  7.7 cmx 2.8 cm x5.9 cm hematoma along the medial aspect of right testicular, adjacent to the urinary bladder. CXR showed mild basilar atelectasis and moderate-sized hiatal hernia. Patient was transferred to [facility] for orthopedics evaluation  Assessment & Plan    Principal Problem: Right acetabulum/iliac bone fracture (HCC) Right superior and inferior pubic rami fracture Periacetabular hematoma -Secondary to unwitnessed mechanical fall -Orthopedics consulted, recommended initial nonoperative management with the WBAT for transfers and follow-up with Dr. Jena Gauss in 2 weeks. -Pain control, PT OT evaluation, bowel regimen  Active Problems:   Essential hypertension, CAD, hyperlipidemia -Hold aspirin due to periacetabular hematoma -Not on any  statin -Resume Lopressor 25mg  twice daily -Continue to hold the losartan, Lasix    Diabetes mellitus type 2 with complications (HCC) -Hold glimepiride -Will place on sliding scale insulin before every meal and at bedtime, sensitive -Hemoglobin A1c 6.9    Myeloproliferative disorder (HCC) -Follows outpatient with Dr. Arbutus Ped -Continue hydroxyurea 500 mg daily    Parkinsonism (HCC) -Continue Sinemet  Hiatal hernia -Continue dysphagia 3 diet, add PPI   Estimated body mass index is 25.77 kg/m as calculated from the following:   Height as of 02/19/22: 6' (1.829 m).   Weight as of 10/01/21: 86.2 kg.  Code Status: Full code DVT Prophylaxis:  enoxaparin (LOVENOX) injection 40 mg Start: 12/12/22 0800   Level of Care: Level of care: Telemetry Surgical Family Communication: Updated patient's son at the bedside Disposition Plan:      Remains inpatient appropriate: PT OT evaluation pending, from ALF   Procedures:    Consultants:   Orthopedics Antimicrobials:   Anti-infectives (From admission, onward)    None          Medications  carbidopa-levodopa  2 tablet Oral 3 times per day   enoxaparin (LOVENOX) injection  40 mg Subcutaneous Q24H   insulin aspart  0-9 Units Subcutaneous Q4H   senna  1 tablet Oral BID      Subjective:   [name] was seen and examined today.  Report pain on moving otherwise stable and comfortable, son at the bedside.  Patient denies any dizziness, chest pain, shortness of breath, abdominal pain, N/V.   Objective:   Vitals:   12/12/22 0441 12/12/22 0449 12/12/22 0450 12/12/22 0819  BP:   (!) 142/73 112/64  Pulse:  (!) 101 (!) 106 100  Resp: (!) 23  (!) 21 18  Temp:   97.6 F (36.4 C) 98.4 F (36.9 C)  TempSrc:   Oral Oral  SpO2:    96%    Intake/Output Summary (Last 24 hours) at 12/12/2022 1025 Last data filed at 12/12/2022 0453 Gross per 24 hour  Intake 120 ml  Output 500 ml  Net -380 ml     Wt Readings from Last 3  Encounters:  10/01/21 86.2 kg  09/20/21 90 kg  02/21/21 90 kg     Exam General: Alert and oriented x 3, NAD Cardiovascular: S1 S2 auscultated,  RRR Respiratory: Clear to auscultation bilaterally Gastrointestinal: Soft, nontender, nondistended, + bowel sounds Ext: no pedal edema bilaterally Neuro: no new deficits Psych: Normal affect     Data Reviewed:  I have personally reviewed following labs    CBC Lab Results  Component Value Date   WBC 19.8 (H) 12/12/2022   RBC 5.52 12/12/2022   HGB 16.0 12/12/2022   HCT 50.0 12/12/2022   MCV 90.6 12/12/2022   MCH 29.0 12/12/2022   PLT 812 (H) 12/12/2022   MCHC 32.0 12/12/2022   RDW 17.3 (H) 12/12/2022   LYMPHSABS 1.9 12/11/2022   MONOABS 1.4 (H) 12/11/2022   EOSABS 0.3 12/11/2022   BASOSABS 0.2 (H) 12/11/2022     Last metabolic panel Lab Results  Component Value Date   NA 131 (L) 12/12/2022   K 4.6 12/12/2022   CL 100 12/12/2022   CO2 22 12/12/2022   BUN 34 (H) 12/12/2022   CREATININE 1.41 (H) 12/12/2022   GLUCOSE 111 (H) 12/12/2022   GFRNONAA 48 (L) 12/12/2022   GFRAA 49 (L) 06/17/2019   CALCIUM 9.4 12/12/2022   PROT 8.1 01/01/2022   ALBUMIN 4.0 01/01/2022   LABGLOB 4.1 01/01/2022   AGRATIO 1.0 (L) 01/01/2022   BILITOT 0.6 01/01/2022   ALKPHOS 85 01/01/2022   AST 10 01/01/2022   ALT 7 01/01/2022   ANIONGAP 9 12/12/2022    CBG (last 3)  Recent Labs    12/12/22 0410 12/12/22 0446 12/12/22 0815  GLUCAP 124* 129* 181*      Coagulation Profile: No results for input(s): "INR", "PROTIME" in the last 168 hours.   Radiology Studies: I have personally reviewed the imaging studies  CT PELVIS WO CONTRAST  Result Date: 12/11/2022 CLINICAL DATA:  Unwitnessed fall. EXAM: CT PELVIS WITHOUT CONTRAST TECHNIQUE: Multidetector CT imaging of the pelvis was performed following the standard protocol without intravenous contrast. RADIATION DOSE REDUCTION: This exam was performed according to the departmental  dose-optimization program which includes automated exposure control, adjustment of the mA and/or kV according to patient size and/or use of iterative reconstruction technique. COMPARISON:  None Available. FINDINGS: Urinary Tract:  No abnormality visualized. Bowel: There is no evidence of bowel dilatation. The appendix is not identified. Numerous noninflamed diverticula are seen throughout the visualized portions of the large bowel. Vascular/Lymphatic: No pathologically enlarged lymph nodes. There is marked severity calcification of the visualized portion of the abdominal aorta  and bilateral common iliac arteries. Reproductive:  The prostate gland is mildly enlarged. Other:  None. Musculoskeletal: Acute nondisplaced fracture deformity is seen involving the medial aspect of the right iliac bone. This extends to involve the superior and medial aspects of the right acetabulum. Additional fractures of the right superior and right inferior pubic rami are seen. A 7.7 cm x 2.8 cm x 5.9 cm hematoma is seen along the medial aspect of the right acetabulum, adjacent to the urinary bladder. IMPRESSION: 1. Acute nondisplaced fracture deformity involving the medial aspect of the right iliac bone which extends to involve the superior and medial aspects of the right acetabulum. 2. Additional fractures of the right superior and right inferior pubic rami. 3. 7.7 cm x 2.8 cm x 5.9 cm hematoma along the medial aspect of the right acetabulum, adjacent to the urinary bladder. 4. Colonic diverticulosis. 5. Aortic atherosclerosis. Aortic Atherosclerosis (ICD10-I70.0). Electronically Signed   By: Aram Candela M.D.   On: 12/11/2022 01:04   DG Chest Portable 1 View  Result Date: 12/11/2022 CLINICAL DATA:  Status post fall. EXAM: PORTABLE CHEST 1 VIEW COMPARISON:  February 09, 2014 FINDINGS: The heart size and mediastinal contours are within normal limits. Mild, diffuse, chronic appearing increased lung markings are seen. Mild  atelectasis is noted within the left lung base. Small multifocal areas of atelectasis and/or infiltrate are also seen scattered throughout the right lung. A moderate sized hiatal hernia is suspected within the retrocardiac region of the left lung base. No pleural effusion or pneumothorax is identified. A chronic fracture deformity of the left humeral head and neck is seen. No acute osseous abnormalities are identified. IMPRESSION: 1. Mild left basilar atelectasis. 2. Small multifocal areas of right lung atelectasis and/or infiltrate. 3. Findings suspicious for a moderate sized hiatal hernia. Correlation with nonemergent abdomen and pelvis CT is recommended. Electronically Signed   By: Aram Candela M.D.   On: 12/11/2022 00:56   DG Hip Unilat  With Pelvis 2-3 Views Right  Result Date: 12/10/2022 CLINICAL DATA:  Hip pain after a fall.  Slip and fall injury. EXAM: DG HIP (WITH OR WITHOUT PELVIS) 2-3V RIGHT COMPARISON:  None Available. FINDINGS: Mild degenerative changes in the lower lumbar spine and hips. The right hip appears intact. No evidence of acute fracture or dislocation. Fractures are demonstrated in the right superior and inferior pubic rami, likely acute. SI joints and symphysis pubis are not displaced. No focal bone lesion or bone destruction. Vascular calcifications. IMPRESSION: Fractures of the right superior and inferior pubic rami, probably acute. Otherwise, no acute fracture or dislocation of the right hip. Mild degenerative changes. Electronically Signed   By: Burman Nieves M.D.   On: 12/10/2022 23:59       Kameron Blethen M.D. [facility] 12/12/2022, 10:25 AM  Available via Epic secure chat 7am-7pm After 7 pm, please refer to night coverage provider listed on amion.

## 2022-12-12 NOTE — Progress Notes (Signed)
Mobility Specialist Progress Note:    12/12/22 1615  Mobility  Activity Transferred from chair to bed  Level of Assistance +2 (takes two people) (ModA)  Assistive Device Stedy  Activity Response Tolerated well  Mobility Referral Yes  Mobility visit 1 Mobility  Mobility Specialist Start Time (ACUTE ONLY) 1530  Mobility Specialist Stop Time (ACUTE ONLY) 1545  Mobility Specialist Time Calculation (min) (ACUTE ONLY) 15 min   Pt received in recliner requesting to get back in bed. Pt needed ModA+2 throughout session. C/o mild R hip pain throughout. Returned to supine in bed w/o fault. All needs met w/ NT in room.  Thompson Grayer Mobility Specialist  Please contact vis Secure Chat or  Rehab Office [phone]

## 2022-12-12 NOTE — Plan of Care (Signed)
  Problem: Coping: Goal: Ability to adjust to condition or change in health will improve Outcome: Progressing   Problem: Metabolic: Goal: Ability to maintain appropriate glucose levels will improve Outcome: Progressing   Problem: Activity: Goal: Risk for activity intolerance will decrease Outcome: Progressing

## 2022-12-12 NOTE — Progress Notes (Signed)
Speech Language Pathology Treatment: Dysphagia  Patient Details Name: [name] MRN: [id number] DOB: 1932-03-17 Today's Date: 12/12/2022 Time: 6578-4696 SLP Time Calculation (min) (ACUTE ONLY): 8 min  Assessment / Plan / Recommendation Clinical Impression  Pt seen upright in the chair, reporting no concerns with current solid texture. Thin liquids via straw resulted in one instance of an immediate cough. He states he avoids use of straws and uses rate control strategies at baseline. Coughing did not recur with subsequent sips via straw or cup. Graham crackers resulted in prolonged mastication and mild oral residue. Provided education re: acquiring denture adhesive, which pt's family member states he will get before next meal. Recommend continuing diet of Dys 3 texture solids with thin liquids, which is pt's baseline. No further SLP f/u is needed for swallowing. Will s/o.    HPI HPI: [name] is an 89 yo male presenting to [facility] ED 12/4 from ALF after a mechanical fall with immediate R hip pain. Transferred to [facility] for potential surgical intervention. Found to have R acetabular/iliac bone fx, R superior and inferior pubic rami fx, and periacetabular hematoma with ortho recommending non-operative management.. CXR with mild basilar atelectasis and moderate R sized hiatal hernia. 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      SLP Plan  All goals met      Recommendations for follow up therapy are one component of a multi-disciplinary discharge planning process, led by the attending physician.  Recommendations may be updated based on patient status, additional functional criteria and insurance authorization.    Recommendations  Diet recommendations: Dysphagia 3 (mechanical soft);Thin liquid Liquids provided via: Cup;No straw Medication Administration: Whole meds with puree Supervision: Patient able to  self feed;Full supervision/cueing for compensatory strategies Compensations: Slow rate;Small sips/bites Postural Changes and/or Swallow Maneuvers: Seated upright 90 degrees                  Oral care BID   Frequent or constant Supervision/Assistance Dysphagia, unspecified (R13.10)     All goals met     Gwynneth Aliment, M.A., CF-SLP Speech Language Pathology, Acute Rehabilitation Services  Secure Chat preferred [phone]   12/12/2022, 1:25 PM

## 2022-12-12 NOTE — Evaluation (Signed)
Physical Therapy Evaluation  Patient Details Name: [name] MRN: [id number] DOB: December 05, 1932 Today's Date: 12/12/2022  History of Present Illness  Pt is an 89 y/o male who presents s/p fall at ALF. CT revealed nondisplaced fracture deformity involving the medial aspect of the right iliac bone which extends to involve the superior and medial aspect of the right S2 block.  Additional fractures of the right superior and right inferior pubic rami.  7.7 cmx 2.8 cm x5.9 cm hematoma. Ortho evaluated and recommended non-op management with WBAT RLE for transfers only, and NWB RLE for ambulation. PMH significant for CAD, DM, HTN.   Clinical Impression  Pt admitted with above diagnosis. Pt currently with functional limitations due to the deficits listed below (see PT Problem List). At the time of PT eval pt was able to perform transfer bed>chair with +2 assist and use of Stedy. RN provided pain medication at start of session however recommend premedication at least 20 minutes prior to next PT session for optimal pain management. Pt will benefit from acute skilled PT to increase their independence and safety with mobility to allow discharge.           If plan is discharge home, recommend the following: Two people to help with walking and/or transfers;Two people to help with bathing/dressing/bathroom;Assistance with cooking/housework;Assist for transportation;Help with stairs or ramp for entrance   Can travel by private vehicle   No    Equipment Recommendations Other (comment) (TBD by next venue of care)  Recommendations for Other Services       Functional Status Assessment Patient has had a recent decline in their functional status and demonstrates the ability to make significant improvements in function in a reasonable and predictable amount of time.     Precautions / Restrictions Precautions Precautions: Fall Precaution Comments: Premedicate for pain Restrictions Weight Bearing  Restrictions: Yes RLE Weight Bearing: Weight bearing as tolerated (Transfers only; NWB for ambulation)      Mobility  Bed Mobility Overal bed mobility: Needs Assistance Bed Mobility: Supine to Sit     Supine to sit: Min assist, Mod assist, +2 for physical assistance, +2 for safety/equipment, HOB elevated, Used rails     General bed mobility comments: Min assist to advance LE's towards EOB. Mod A to elevate trunk to full sitting position. +2 for scooting around with bed pad and safety.    Transfers Overall transfer level: Needs assistance Equipment used: Ambulation equipment used Transfers: Sit to/from Stand, Bed to chair/wheelchair/BSC Sit to Stand: Mod assist, +2 physical assistance, +2 safety/equipment, From elevated surface           General transfer comment: VC's for hand placement on seated surface for safety. +2 for power up initially. Pt with very flexed trunk and unable to achieve neutral spine. Bed>chair with [employer]. Transfer via Lift Equipment: Stedy  Ambulation/Gait               General Gait Details: Unable to progress to gait training at this time.  Stairs            Wheelchair Mobility     Tilt Bed    Modified Rankin (Stroke Patients Only)       Balance Overall balance assessment: Needs assistance, Mild deficits observed, not formally tested Sitting-balance support: Feet supported, No upper extremity supported Sitting balance-Leahy Scale: Poor Sitting balance - Comments: Fall to the R in sitting requiring assist to recover Postural control: Right lateral lean Standing balance support: Bilateral upper extremity supported, During  functional activity, Reliant on assistive device for balance Standing balance-Leahy Scale: Poor                               Pertinent Vitals/Pain Pain Assessment Pain Assessment: 0-10 Pain Score: 3  Pain Location: R pelvis/hip area at rest Pain Descriptors / Indicators: Sore Pain  Intervention(s): Limited activity within patient's tolerance, Monitored during session, Repositioned    Home Living Family/patient expects to be discharged to:: Assisted living Living Arrangements: Alone               Home Equipment: Agricultural consultant (2 wheels)      Prior Function Prior Level of Function : Needs assist             Mobility Comments: Uses a walker with a chair follow ADLs Comments: Staff assists with ADL's     Extremity/Trunk Assessment   Upper Extremity Assessment Upper Extremity Assessment: Defer to OT evaluation    Lower Extremity Assessment Lower Extremity Assessment: RLE deficits/detail RLE Deficits / Details: Acute pain, decreased strength and AROM consistent with above mentioned injury.    Cervical / Trunk Assessment Cervical / Trunk Assessment: Kyphotic  Communication   Communication Communication: Difficulty following commands/understanding Following commands: Follows one step commands consistently;Follows one step commands with increased time Cueing Techniques: Verbal cues;Gestural cues;Tactile cues  Cognition Arousal: Alert Behavior During Therapy: Flat affect (but trying to joke/be sarcastic at times) Overall Cognitive Status: Impaired/Different from baseline Area of Impairment: Attention, Following commands, Safety/judgement, Awareness, Problem solving                   Current Attention Level: Sustained   Following Commands: Follows one step commands consistently, Follows one step commands with increased time (and repetition of cue) Safety/Judgement: Decreased awareness of safety Awareness: Emergent Problem Solving: Slow processing, Decreased initiation, Requires verbal cues, Requires tactile cues, Difficulty sequencing General Comments: Pt with delated responses at times. Son reports pt is not HOH. Internally distracted by anticipation of pain at times.        General Comments      Exercises     Assessment/Plan     PT Assessment Patient needs continued PT services  PT Problem List Decreased strength;Decreased range of motion;Decreased activity tolerance;Decreased balance;Decreased mobility;Decreased cognition;Decreased knowledge of use of DME;Decreased safety awareness;Decreased knowledge of precautions;Pain       PT Treatment Interventions DME instruction;Gait training;Functional mobility training;Therapeutic activities;Therapeutic exercise;Balance training;Patient/family education    PT Goals (Current goals can be found in the Care Plan section)  Acute Rehab PT Goals Patient Stated Goal: Get better, decrease pain PT Goal Formulation: With patient/family Time For Goal Achievement: 12/26/22 Potential to Achieve Goals: Good    Frequency Min 1X/week     Co-evaluation               AM-PAC PT "6 Clicks" Mobility  Outcome Measure Help needed turning from your back to your side while in a flat bed without using bedrails?: A Lot Help needed moving from lying on your back to sitting on the side of a flat bed without using bedrails?: Total Help needed moving to and from a bed to a chair (including a wheelchair)?: Total Help needed standing up from a chair using your arms (e.g., wheelchair or bedside chair)?: Total Help needed to walk in hospital room?: Total Help needed climbing 3-5 steps with a railing? : Total 6 Click Score: 7    End  of Session Equipment Utilized During Treatment: Gait belt Activity Tolerance: Patient limited by pain Patient left: in chair;with call bell/phone within reach;with chair alarm set;with family/visitor present Nurse Communication: Mobility status PT Visit Diagnosis: Unsteadiness on feet (R26.81);Pain;Difficulty in walking, not elsewhere classified (R26.2) Pain - Right/Left: Right Pain - part of body: Hip    Time: 4098-1191 PT Time Calculation (min) (ACUTE ONLY): 30 min   Charges:   PT Evaluation $PT Eval Moderate Complexity: 1 Mod PT Treatments $Gait  Training: 8-22 mins PT General Charges $$ ACUTE PT VISIT: 1 Visit         Conni Slipper, PT, DPT Acute Rehabilitation Services Secure Chat Preferred Office: [phone]   Marylynn Pearson 12/12/2022, 12:07 PM

## 2022-12-12 NOTE — TOC Initial Note (Signed)
Transition of Care [facility]) - Initial/Assessment Note    Patient Details  Name: [name] MRN: [id number] Date of Birth: [birth year]  Transition of Care [facility]) CM/SW Contact:    Lorri Frederick, LCSW Phone Number: 12/12/2022, 3:30 PM  Clinical Narrative:   CSW met with pt and son [name] regarding PT recommendations for SNF.  They confirm pt from ALF at [facility], they do want to [facility] for [facility].  Permission given to speak with both sons, Gabriel Rung and [name].    CSW spoke with Brittany/[facility] and she confirmed the above, will look at bed availability.                  Expected Discharge Plan: Skilled Nursing Facility Barriers to Discharge: Continued Medical Work up   Patient Goals and CMS Choice Patient states their goals for this hospitalization and ongoing recovery are:: walking   Choice offered to / list presented to : Patient, Adult Children (son [name])      Expected Discharge Plan and Services In-house Referral: Clinical Social Work   Post Acute Care Choice: Skilled Nursing Facility Living arrangements for the past 2 months: Assisted Living Facility Office manager)                                      Prior Living Arrangements/Services Living arrangements for the past 2 months: Assisted Living Facility Office manager) Lives with:: Facility Resident Patient language and need for interpreter reviewed:: Yes Do you feel safe going back to the place where you live?: Yes      Need for Family Participation in Patient Care: Yes (Comment) Care giver support system in place?: Yes (comment) Current home services: Other (comment) (na) Criminal Activity/Legal Involvement Pertinent to Current Situation/Hospitalization: No - Comment as needed  Activities of Daily Living      Permission Sought/Granted Permission sought to share information with : Family Supports Permission granted to share information with : Yes, Verbal Permission Granted  Share Information  with NAME: sons [name] and [name]  Permission granted to share info w AGENCY: [employer]        Emotional Assessment Appearance:: Appears stated age Attitude/Demeanor/Rapport: Engaged Affect (typically observed): Appropriate Orientation: : Oriented to Self, Oriented to Place, Oriented to  Time, Oriented to Situation      Admission diagnosis:  Thrombocytosis [D75.839] Acetabulum fracture (HCC) [S32.409A] Fall, initial encounter [W19.XXXA] Closed fracture of right inferior pubic ramus, initial encounter (HCC) [S32.591A] Leukocytosis, unspecified type [D72.829] Closed nondisplaced fracture of right acetabulum, unspecified portion of acetabulum, initial encounter [facility]) [S32.401A] Patient Active Problem List   Diagnosis Date Noted   Acetabulum fracture (HCC) 12/11/2022   Gait abnormality 10/01/2021   Parkinsonism (HCC) 10/01/2021   Thoracic aortic aneurysm (HCC) 08/16/2020   Bilateral lower extremity edema 06/11/2019   Monoclonal gammopathy of unknown significance 01/08/2013   Myeloproliferative disorder (HCC) 10/09/2012   Cervicalgia 09/03/2012   Diabetes mellitus type 2 with complications (HCC) 09/02/2012   Diabetic neuropathy (HCC) 09/02/2012   AKI (acute kidney injury) (HCC) 06/18/2012   Orthostatic hypotension 06/18/2012   Gait instability 06/18/2012   Protein-calorie malnutrition, severe (HCC) 06/18/2012   Dizziness of unknown cause 06/17/2012   Acute renal failure (HCC) 06/17/2012   Leukocytopenia 06/17/2012   Thrombocytosis 06/17/2012   HYPERLIPIDEMIA 02/29/2008   Coronary atherosclerosis 02/29/2008   GERD 02/29/2008   LOW BACK PAIN 02/29/2008   DIABETES MELLITUS, TYPE II 10/15/2007   Essential  hypertension 10/15/2007   URI 10/15/2007   PCP:  [name], MD Pharmacy:   [facility] [address] [place] - [address] [place] [address] Phone: [phone] Fax: [phone]     Social Determinants of Health  (SDOH) Social History: SDOH Screenings   Food Insecurity: No Food Insecurity (12/11/2022)  Housing: Low Risk  (12/11/2022)  Transportation Needs: No Transportation Needs (12/11/2022)  Utilities: Not At Risk (12/11/2022)  Tobacco Use: Medium Risk (12/10/2022)   SDOH Interventions:     Readmission Risk Interventions     No data to display

## 2022-12-12 NOTE — NC FL2 (Signed)
MEDICAID FL2 LEVEL OF CARE FORM     IDENTIFICATION  Patient Name: [name] Birthdate: 10/19/1932 Sex: male Admission Date (Current Location): 12/10/2022  [place] and [place] Number:  Haynes Bast [id number] O Facility and Address:  [facility], [address] [place], [place] [address]      Provider Number: [id number]  Attending Physician Name and Address:  Cathren Harsh, MD  Relative Name and Phone Number:  Maijor, Gines [phone]    Current Level of Care: Hospital Recommended Level of Care: Skilled Nursing Facility Prior Approval Number:    Date Approved/Denied:   PASRR Number: [id number]  Discharge Plan: SNF    Current Diagnoses: Patient Active Problem List   Diagnosis Date Noted   Acetabulum fracture (HCC) 12/11/2022   Gait abnormality 10/01/2021   Parkinsonism (HCC) 10/01/2021   Thoracic aortic aneurysm (HCC) 08/16/2020   Bilateral lower extremity edema 06/11/2019   Monoclonal gammopathy of unknown significance 01/08/2013   Myeloproliferative disorder (HCC) 10/09/2012   Cervicalgia 09/03/2012   Diabetes mellitus type 2 with complications (HCC) 09/02/2012   Diabetic neuropathy (HCC) 09/02/2012   AKI (acute kidney injury) (HCC) 06/18/2012   Orthostatic hypotension 06/18/2012   Gait instability 06/18/2012   Protein-calorie malnutrition, severe (HCC) 06/18/2012   Dizziness of unknown cause 06/17/2012   Acute renal failure (HCC) 06/17/2012   Leukocytopenia 06/17/2012   Thrombocytosis 06/17/2012   HYPERLIPIDEMIA 02/29/2008   Coronary atherosclerosis 02/29/2008   GERD 02/29/2008   LOW BACK PAIN 02/29/2008   DIABETES MELLITUS, TYPE II 10/15/2007   Essential hypertension 10/15/2007   URI 10/15/2007    Orientation RESPIRATION BLADDER Height & Weight     Self, Time, Situation, Place  Normal Incontinent Weight:   Height:     BEHAVIORAL SYMPTOMS/MOOD NEUROLOGICAL BOWEL NUTRITION STATUS      Continent Diet (see  discharge summary)  AMBULATORY STATUS COMMUNICATION OF NEEDS Skin   Total Care Verbally Other (Comment) (stage 2 pressure injury to sacrum, ecchymosis)                       Personal Care Assistance Level of Assistance  Bathing, Feeding, Dressing, Total care Bathing Assistance: Maximum assistance Feeding assistance: Maximum assistance Dressing Assistance: Maximum assistance Total Care Assistance: Maximum assistance   Functional Limitations Info  Sight, Hearing, Speech Sight Info: Adequate Hearing Info: Impaired Speech Info: Adequate    SPECIAL CARE FACTORS FREQUENCY  PT (By licensed PT), OT (By licensed OT)     PT Frequency: 5x week OT Frequency: 5x week            Contractures Contractures Info: Not present    Additional Factors Info  Code Status, Allergies, Insulin Sliding Scale Code Status Info: full Allergies Info: NKA   Insulin Sliding Scale Info: novolog: see discharge summary       Current Medications (12/12/2022):  This is the current hospital active medication list Current Facility-Administered Medications  Medication Dose Route Frequency Provider Last Rate Last Admin   acetaminophen (TYLENOL) tablet 650 mg  650 mg Oral Q6H PRN Dahal, Melina Schools, MD       Or   acetaminophen (TYLENOL) suppository 650 mg  650 mg Rectal Q6H PRN Dahal, Binaya, MD       albuterol (PROVENTIL) (2.5 MG/3ML) 0.083% nebulizer solution 2.5 mg  2.5 mg Nebulization Q6H PRN Dahal, Binaya, MD       carbidopa-levodopa (SINEMET IR) 25-100 MG per tablet immediate release 2 tablet  2 tablet Oral BID Rai,  Ripudeep K, MD       enoxaparin (LOVENOX) injection 40 mg  40 mg Subcutaneous Q24H Dahal, Melina Schools, MD   40 mg at 12/12/22 0944   hydrALAZINE (APRESOLINE) injection 10 mg  10 mg Intravenous Q6H PRN Dahal, Melina Schools, MD       HYDROmorphone (DILAUDID) injection 0.5 mg  0.5 mg Intravenous Q2H PRN Dahal, Melina Schools, MD   0.5 mg at 12/11/22 2100   hydroxyurea (HYDREA) capsule 500 mg  500 mg Oral Daily Rai,  Ripudeep K, MD   500 mg at 12/12/22 1413   insulin aspart (novoLOG) injection 0-5 Units  0-5 Units Subcutaneous QHS Rai, Ripudeep K, MD       insulin aspart (novoLOG) injection 0-9 Units  0-9 Units Subcutaneous TID WC Rai, Ripudeep K, MD   5 Units at 12/12/22 1246   methocarbamol (ROBAXIN) injection 500 mg  500 mg Intravenous Q6H PRN Dahal, Melina Schools, MD       metoprolol tartrate (LOPRESSOR) tablet 25 mg  25 mg Oral BID Rai, Ripudeep K, MD   25 mg at 12/12/22 1246   oxyCODONE (Oxy IR/ROXICODONE) immediate release tablet 5 mg  5 mg Oral Q4H PRN Dahal, Melina Schools, MD   5 mg at 12/12/22 0944   pantoprazole (PROTONIX) EC tablet 40 mg  40 mg Oral Q0600 Rai, Ripudeep K, MD   40 mg at 12/12/22 1246   polyethylene glycol (MIRALAX / GLYCOLAX) packet 17 g  17 g Oral Daily Rai, Ripudeep K, MD   17 g at 12/12/22 1246   senna (SENOKOT) tablet 8.6 mg  1 tablet Oral BID Lorin Glass, MD   8.6 mg at 12/11/22 2059     Discharge Medications: Please see discharge summary for a list of discharge medications.  Relevant Imaging Results:  Relevant Lab Results:   Additional Information SSN: [id number]  Lorri Frederick, LCSW

## 2022-12-13 DIAGNOSIS — S32591A Other specified fracture of right pubis, initial encounter for closed fracture: Secondary | ICD-10-CM | POA: Diagnosis not present

## 2022-12-13 DIAGNOSIS — D72829 Elevated white blood cell count, unspecified: Secondary | ICD-10-CM | POA: Diagnosis not present

## 2022-12-13 DIAGNOSIS — W19XXXA Unspecified fall, initial encounter: Secondary | ICD-10-CM | POA: Diagnosis not present

## 2022-12-13 DIAGNOSIS — S32401A Unspecified fracture of right acetabulum, initial encounter for closed fracture: Secondary | ICD-10-CM | POA: Diagnosis not present

## 2022-12-13 LAB — GLUCOSE, CAPILLARY
Glucose-Capillary: 238 mg/dL — ABNORMAL HIGH (ref 70–99)
Glucose-Capillary: 197 mg/dL — ABNORMAL HIGH (ref 70–99)
Glucose-Capillary: 208 mg/dL — ABNORMAL HIGH (ref 70–99)
Glucose-Capillary: 161 mg/dL — ABNORMAL HIGH (ref 70–99)
Glucose-Capillary: 170 mg/dL — ABNORMAL HIGH (ref 70–99)

## 2022-12-13 LAB — CBC
HCT: 46.5 % (ref 39.0–52.0)
Hemoglobin: 15.3 g/dL (ref 13.0–17.0)
MCH: 29.5 pg (ref 26.0–34.0)
MCHC: 32.9 g/dL (ref 30.0–36.0)
MCV: 89.8 fL (ref 80.0–100.0)
Platelets: 713 10*3/uL — ABNORMAL HIGH (ref 150–400)
RBC: 5.18 MIL/uL (ref 4.22–5.81)
RDW: 17.1 % — ABNORMAL HIGH (ref 11.5–15.5)
WBC: 19.5 10*3/uL — ABNORMAL HIGH (ref 4.0–10.5)
nRBC: 0 % (ref 0.0–0.2)

## 2022-12-13 LAB — BASIC METABOLIC PANEL
Anion gap: 9 (ref 5–15)
BUN: 40 mg/dL — ABNORMAL HIGH (ref 8–23)
CO2: 19 mmol/L — ABNORMAL LOW (ref 22–32)
Calcium: 9.5 mg/dL (ref 8.9–10.3)
Chloride: 102 mmol/L (ref 98–111)
Creatinine, Ser: 1.27 mg/dL — ABNORMAL HIGH (ref 0.61–1.24)
GFR, Estimated: 55 mL/min — ABNORMAL LOW (ref >60)
Glucose, Bld: 151 mg/dL — ABNORMAL HIGH (ref 70–99)
Potassium: 4.7 mmol/L (ref 3.5–5.1)
Sodium: 130 mmol/L — ABNORMAL LOW (ref 135–145)

## 2022-12-13 MED ORDER — INSULIN ASPART 100 UNIT/ML IJ SOLN
0.0000 [IU] | Freq: Three times a day (TID) | INTRAMUSCULAR | Status: DC
  Administered 2022-12-14 – 2022-12-15 (×4): 5 [IU] via SUBCUTANEOUS
  Administered 2022-12-15 – 2022-12-16 (×3): 3 [IU] via SUBCUTANEOUS

## 2022-12-13 NOTE — Progress Notes (Signed)
[facility]                                                                              Leeroy Lauridsen, is a 89 y.o. 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  On 12/3, patient was brought to ED at [facility] by EMS after on denies fall leading to immediate right hip pain.  Patient had repeat to that he was trying to ambulate when he slipped and fell, unsure if he hit his head, did not pass out. In ED, afebrile, stable. X-ray of the hip showed fracture of the right superior and inferior pubic rami CT pelvis showed acute nondisplaced fracture deformity involving the medial aspect of the right iliac bone which extends to involve the superior and medial aspect of the right S2 block.  Additional fractures of the right superior and right inferior pubic rami.  7.7 cmx 2.8 cm x5.9 cm hematoma along the medial aspect of right testicular, adjacent to the urinary bladder. CXR showed mild basilar atelectasis and moderate-sized hiatal hernia. Patient was transferred to [facility] for orthopedics evaluation  Assessment & Plan    Principal Problem: Right acetabulum/iliac bone fracture (HCC) Right superior and inferior pubic rami fracture Periacetabular hematoma -Secondary to unwitnessed mechanical fall -Orthopedics consulted, recommended initial nonoperative management with the WBAT for transfers and follow-up with Dr. Jena Gauss in 2 weeks. -Continue pain control, physical therapy, bowel regimen  -Patient will need skilled nursing facility rehab after discharge.  Active Problems:   Essential hypertension,  CAD, hyperlipidemia -Hold aspirin due to periacetabular hematoma -Not on any statin -BP stable, continue Lopressor 25mg  twice daily -Continue to hold the losartan, Lasix    Diabetes mellitus type 2 with complications (HCC) -Hold glimepiride -Hemoglobin A1c 6.9 CBG (last 3)  Recent Labs    12/12/22 2019 12/13/22 0819 12/13/22 1135  GLUCAP 174* 238* 197*  -Continue sliding scale insulin while inpatient, increased to moderate     Myeloproliferative disorder (HCC) -Follows outpatient with Dr. Arbutus Ped -Continue hydroxyurea 500 mg daily    Parkinsonism (HCC) -Continue Sinemet  Hiatal hernia -Continue dysphagia 3 diet, add PPI   Estimated body mass index is 25.77 kg/m as calculated from the following:   Height as of 02/19/22: 6' (1.829 m).   Weight as of 10/01/21: 86.2 kg.  Code Status: Full code DVT Prophylaxis:  enoxaparin (LOVENOX) injection 40 mg Start: 12/12/22 0800   Level of Care: Level of care: Telemetry Surgical Family Communication: Updated patient's son at the bedside Disposition Plan:      Remains inpatient appropriate: Will need skilled nursing facility, from ALF Likely DC on Monday  Procedures:    Consultants:   Orthopedics Antimicrobials:   Anti-infectives (From admission, onward)    None  Medications  carbidopa-levodopa  2 tablet Oral BID   enoxaparin (LOVENOX) injection  40 mg Subcutaneous Q24H   hydroxyurea  500 mg Oral Daily   insulin aspart  0-5 Units Subcutaneous QHS   insulin aspart  0-9 Units Subcutaneous TID WC   metoprolol tartrate  25 mg Oral BID   pantoprazole  40 mg Oral Q0600   polyethylene glycol  17 g Oral Daily   senna  1 tablet Oral BID      Subjective:   Taylor Talati was seen and examined today.  Currently stable, send at the bedside.  No acute issues overnight.  Patient denies any dizziness, chest pain, shortness of breath, abdominal pain, N/V.   Objective:   Vitals:   12/12/22 1527 12/12/22 2021  12/13/22 0500 12/13/22 0819  BP: 107/64 122/62 121/79 124/66  Pulse: 72   87  Resp: 17 18  16   Temp: (!) 97.5 F (36.4 C) 98.4 F (36.9 C) 97.9 F (36.6 C) 98.4 F (36.9 C)  TempSrc: Oral Oral Oral Oral  SpO2: 94% 98%  94%    Intake/Output Summary (Last 24 hours) at 12/13/2022 1405 Last data filed at 12/13/2022 1326 Gross per 24 hour  Intake 840 ml  Output 1000 ml  Net -160 ml     Wt Readings from Last 3 Encounters:  10/01/21 86.2 kg  09/20/21 90 kg  02/21/21 90 kg   Physical Exam General: Alert and oriented, NAD, appears comfortable Cardiovascular: S1 S2 clear, RRR.  Respiratory: CTAB, no wheezing Gastrointestinal: Soft, nontender, nondistended, NBS Ext: no pedal edema bilaterally Neuro: no new deficits Psych: Normal affect     Data Reviewed:  I have personally reviewed following labs    CBC Lab Results  Component Value Date   WBC 19.5 (H) 12/13/2022   RBC 5.18 12/13/2022   HGB 15.3 12/13/2022   HCT 46.5 12/13/2022   MCV 89.8 12/13/2022   MCH 29.5 12/13/2022   PLT 713 (H) 12/13/2022   MCHC 32.9 12/13/2022   RDW 17.1 (H) 12/13/2022   LYMPHSABS 1.9 12/11/2022   MONOABS 1.4 (H) 12/11/2022   EOSABS 0.3 12/11/2022   BASOSABS 0.2 (H) 12/11/2022     Last metabolic panel Lab Results  Component Value Date   NA 130 (L) 12/13/2022   K 4.7 12/13/2022   CL 102 12/13/2022   CO2 19 (L) 12/13/2022   BUN 40 (H) 12/13/2022   CREATININE 1.27 (H) 12/13/2022   GLUCOSE 151 (H) 12/13/2022   GFRNONAA 55 (L) 12/13/2022   GFRAA 49 (L) 06/17/2019   CALCIUM 9.5 12/13/2022   PROT 8.1 01/01/2022   ALBUMIN 4.0 01/01/2022   LABGLOB 4.1 01/01/2022   AGRATIO 1.0 (L) 01/01/2022   BILITOT 0.6 01/01/2022   ALKPHOS 85 01/01/2022   AST 10 01/01/2022   ALT 7 01/01/2022   ANIONGAP 9 12/13/2022    CBG (last 3)  Recent Labs    12/12/22 2019 12/13/22 0819 12/13/22 1135  GLUCAP 174* 238* 197*      Coagulation Profile: No results for input(s): "INR", "PROTIME" in the  last 168 hours.   Radiology Studies: I have personally reviewed the imaging studies  No results found.     Thad Ranger M.D. [facility] 12/13/2022, 2:05 PM  Available via Epic secure chat 7am-7pm After 7 pm, please refer to night coverage provider listed on amion.

## 2022-12-13 NOTE — TOC Progression Note (Signed)
Transition of Care [facility]) - Progression Note    Patient Details  Name: ELSA WEDELL MRN: [id number] Date of Birth: 04-20-1932  Transition of Care [facility]) CM/SW Contact  Lorri Frederick, LCSW Phone Number: 12/13/2022, 1:19 PM  Clinical Narrative:   Per MD, potential DC Monday, CSW spoke wit Brittany/[facility], who confirmed can receive pt on Monday.  Angela/CMA will start aetna auth.      Expected Discharge Plan: Skilled Nursing Facility Barriers to Discharge: Continued Medical Work up  Expected Discharge Plan and Services In-house Referral: Clinical Social Work   Post Acute Care Choice: Skilled Nursing Facility Living arrangements for the past 2 months: Assisted Living Facility Office manager)                                       Social Determinants of Health (SDOH) Interventions SDOH Screenings   Food Insecurity: No Food Insecurity (12/11/2022)  Housing: Low Risk  (12/11/2022)  Transportation Needs: No Transportation Needs (12/11/2022)  Utilities: Not At Risk (12/11/2022)  Tobacco Use: Medium Risk (12/10/2022)    Readmission Risk Interventions     No data to display

## 2022-12-13 NOTE — Progress Notes (Signed)
Pt refused mobility throughout shift. Per patient and family member, patient is worn out from PT earlier during day shift and needs to rest. Patient and family educated about keeping pressure off of his bottom. Pt and family verbalized their understanding.

## 2022-12-14 DIAGNOSIS — D75839 Thrombocytosis, unspecified: Secondary | ICD-10-CM | POA: Diagnosis not present

## 2022-12-14 DIAGNOSIS — S32401A Unspecified fracture of right acetabulum, initial encounter for closed fracture: Secondary | ICD-10-CM | POA: Diagnosis not present

## 2022-12-14 DIAGNOSIS — S32591A Other specified fracture of right pubis, initial encounter for closed fracture: Secondary | ICD-10-CM | POA: Diagnosis not present

## 2022-12-14 DIAGNOSIS — W19XXXA Unspecified fall, initial encounter: Secondary | ICD-10-CM | POA: Diagnosis not present

## 2022-12-14 LAB — GLUCOSE, CAPILLARY
Glucose-Capillary: 179 mg/dL — ABNORMAL HIGH (ref 70–99)
Glucose-Capillary: 244 mg/dL — ABNORMAL HIGH (ref 70–99)
Glucose-Capillary: 225 mg/dL — ABNORMAL HIGH (ref 70–99)
Glucose-Capillary: 234 mg/dL — ABNORMAL HIGH (ref 70–99)
Glucose-Capillary: 181 mg/dL — ABNORMAL HIGH (ref 70–99)

## 2022-12-14 LAB — BASIC METABOLIC PANEL
Anion gap: 10 (ref 5–15)
BUN: 40 mg/dL — ABNORMAL HIGH (ref 8–23)
CO2: 19 mmol/L — ABNORMAL LOW (ref 22–32)
Calcium: 9.3 mg/dL (ref 8.9–10.3)
Chloride: 101 mmol/L (ref 98–111)
Creatinine, Ser: 1.21 mg/dL (ref 0.61–1.24)
GFR, Estimated: 58 mL/min — ABNORMAL LOW (ref >60)
Glucose, Bld: 169 mg/dL — ABNORMAL HIGH (ref 70–99)
Potassium: 4.6 mmol/L (ref 3.5–5.1)
Sodium: 130 mmol/L — ABNORMAL LOW (ref 135–145)

## 2022-12-14 LAB — CBC
HCT: 46.7 % (ref 39.0–52.0)
Hemoglobin: 15.5 g/dL (ref 13.0–17.0)
MCH: 30 pg (ref 26.0–34.0)
MCHC: 33.2 g/dL (ref 30.0–36.0)
MCV: 90.5 fL (ref 80.0–100.0)
Platelets: 600 10*3/uL — ABNORMAL HIGH (ref 150–400)
RBC: 5.16 MIL/uL (ref 4.22–5.81)
RDW: 16.9 % — ABNORMAL HIGH (ref 11.5–15.5)
WBC: 17.1 10*3/uL — ABNORMAL HIGH (ref 4.0–10.5)
nRBC: 0 % (ref 0.0–0.2)

## 2022-12-14 MED ORDER — INSULIN ASPART 100 UNIT/ML IJ SOLN
3.0000 [IU] | Freq: Three times a day (TID) | INTRAMUSCULAR | Status: DC
  Administered 2022-12-14 – 2022-12-16 (×2): 3 [IU] via SUBCUTANEOUS

## 2022-12-14 MED ORDER — LACTULOSE 10 GM/15ML PO SOLN
20.0000 g | Freq: Two times a day (BID) | ORAL | Status: DC | PRN
  Administered 2022-12-14: 20 g via ORAL
  Filled 2022-12-14: qty 30

## 2022-12-14 NOTE — Plan of Care (Signed)
  Problem: Health Behavior/Discharge Planning: Goal: Ability to identify and utilize available resources and services will improve Outcome: Progressing Goal: Ability to manage health-related needs will improve Outcome: Progressing   Problem: Education: Goal: Knowledge of General Education information will improve Description: Including pain rating scale, medication(s)/side effects and non-pharmacologic comfort measures Outcome: Progressing   Problem: Health Behavior/Discharge Planning: Goal: Ability to manage health-related needs will improve Outcome: Progressing   Problem: Clinical Measurements: Goal: Ability to maintain clinical measurements within normal limits will improve Outcome: Progressing Goal: Will remain free from infection Outcome: Progressing Goal: Diagnostic test results will improve Outcome: Progressing Goal: Respiratory complications will improve Outcome: Progressing Goal: Cardiovascular complication will be avoided Outcome: Progressing   Problem: Pain Management: Goal: General experience of comfort will improve Outcome: Progressing   Problem: Safety: Goal: Ability to remain free from injury will improve Outcome: Progressing

## 2022-12-14 NOTE — Plan of Care (Signed)
  Problem: Metabolic: Goal: Ability to maintain appropriate glucose levels will improve Outcome: Progressing   Problem: Skin Integrity: Goal: Risk for impaired skin integrity will decrease Outcome: Progressing   Problem: Pain Management: Goal: General experience of comfort will improve Outcome: Progressing

## 2022-12-14 NOTE — Plan of Care (Signed)
  Problem: Education: Goal: Ability to describe self-care measures that may prevent or decrease complications (Diabetes Survival Skills Education) will improve Outcome: Progressing Goal: Individualized Educational Video(s) Outcome: Progressing   Problem: Coping: Goal: Ability to adjust to condition or change in health will improve Outcome: Progressing   Problem: Nutritional: Goal: Maintenance of adequate nutrition will improve Outcome: Progressing Goal: Progress toward achieving an optimal weight will improve Outcome: Progressing   Problem: Skin Integrity: Goal: Risk for impaired skin integrity will decrease Outcome: Progressing   Problem: Education: Goal: Knowledge of General Education information will improve Description: Including pain rating scale, medication(s)/side effects and non-pharmacologic comfort measures Outcome: Progressing

## 2022-12-14 NOTE — Progress Notes (Signed)
Physical Therapy Treatment  Patient Details Name: [name] MRN: [id number] DOB: 1932-09-02 Today's Date: 12/14/2022   History of Present Illness Pt is an 89 y/o male who presents s/p fall at ALF. CT revealed nondisplaced fracture deformity involving the medial aspect of the right iliac bone which extends to involve the superior and medial aspect of the right S2 block.  Additional fractures of the right superior and right inferior pubic rami.  7.7 cm x 2.8 cm x 5.9 cm hematoma. Ortho evaluated and recommended non-op management with WBAT RLE for transfers only, and NWB RLE for ambulation. PMH significant for CAD, DM, HTN.    PT Comments  Pt progressing slowly towards physical therapy goals. Noted pt with decreased initiation of movement and required increased assistance for basic transfers this date. Continued rehab <3 hours/day continues to be the most appropriate d/c disposition at this time. Son politely inquiring about PT frequency and expectations for mobility. Pt and son were educated on PT frequency and that pt is able to transfer OOB to chair with nursing staff or mobility specialists in addition to therapies, and pt does not have to wait for PT to get OOB. Son acknowledged understanding. Will continue to follow and progress as able per POC.    If plan is discharge home, recommend the following: Two people to help with walking and/or transfers;Two people to help with bathing/dressing/bathroom;Assistance with cooking/housework;Assist for transportation;Help with stairs or ramp for entrance   Can travel by private vehicle     No  Equipment Recommendations  Other (comment) (TBD by next venue of care)    Recommendations for Other Services       Precautions / Restrictions Precautions Precautions: Fall Precaution Comments: Premedicate for pain Restrictions Weight Bearing Restrictions: Yes RLE Weight Bearing: Weight bearing as tolerated Other Position/Activity Restrictions: Ortho  approved WBAT for transfers only, NWB for ambulation.     Mobility  Bed Mobility Overal bed mobility: Needs Assistance Bed Mobility: Supine to Sit     Supine to sit: Mod assist, +2 for physical assistance, +2 for safety/equipment, HOB elevated, Used rails, Max assist     General bed mobility comments: Decreased initiation of movement this session, saying "you're going to have to do it". Mod assist to advance LE's and +2 max for trunk elevation to full sitting position.    Transfers Overall transfer level: Needs assistance Equipment used: Ambulation equipment used Transfers: Sit to/from Stand, Bed to chair/wheelchair/BSC Sit to Stand: Mod assist, +2 physical assistance, +2 safety/equipment, From elevated surface, Max assist           General transfer comment: Heavy +2 assist to power up to full stand. Poor posture with very flexed trunk and inability to achieve neutral spine. Transfer via Lift Equipment: Stedy  Ambulation/Gait               General Gait Details: Unable to progress to gait training at this time.   Stairs             Wheelchair Mobility     Tilt Bed    Modified Rankin (Stroke Patients Only)       Balance Overall balance assessment: Needs assistance, Mild deficits observed, not formally tested Sitting-balance support: Feet supported, No upper extremity supported Sitting balance-Leahy Scale: Poor Sitting balance - Comments: leans to the R sitting unsupported, posterior lean with poor tolerance for upright Postural control: Right lateral lean, Posterior lean Standing balance support: Bilateral upper extremity supported, During functional activity, Reliant on assistive  device for balance Standing balance-Leahy Scale: Poor                              Cognition Arousal: Alert Behavior During Therapy: Flat affect (but trying to joke/be sarcastic at times) Overall Cognitive Status: Impaired/Different from baseline Area of  Impairment: Attention, Following commands, Safety/judgement, Awareness, Problem solving, Memory                   Current Attention Level: Sustained Memory: Decreased short-term memory Following Commands: Follows one step commands consistently, Follows one step commands with increased time (and repetition of cue) Safety/Judgement: Decreased awareness of safety Awareness: Emergent Problem Solving: Slow processing, Decreased initiation, Requires verbal cues, Requires tactile cues, Difficulty sequencing General Comments: Pt with delayed responses at times. Internally distracted by anticipation of pain at times. Able to follow simple commands with increased time, oriented.        Exercises      General Comments General comments (skin integrity, edema, etc.): Son present at bedside and supportive. Son with questions regarding PT frequency. Pt and son were educated on PT's current recommended frequency.      Pertinent Vitals/Pain Pain Assessment Pain Assessment: Faces Faces Pain Scale: Hurts even more Pain Location: R pelvis/hip area with movement Pain Descriptors / Indicators: Sore, Discomfort, Grimacing, Guarding Pain Intervention(s): Limited activity within patient's tolerance, Monitored during session, Repositioned    Home Living                          Prior Function            PT Goals (current goals can now be found in the care plan section) Acute Rehab PT Goals Patient Stated Goal: Get better, decrease pain PT Goal Formulation: With patient/family Time For Goal Achievement: 12/26/22 Potential to Achieve Goals: Good Progress towards PT goals: Progressing toward goals    Frequency    Min 1X/week      PT Plan      Co-evaluation              AM-PAC PT "6 Clicks" Mobility   Outcome Measure  Help needed turning from your back to your side while in a flat bed without using bedrails?: A Lot Help needed moving from lying on your back to  sitting on the side of a flat bed without using bedrails?: Total Help needed moving to and from a bed to a chair (including a wheelchair)?: Total Help needed standing up from a chair using your arms (e.g., wheelchair or bedside chair)?: Total Help needed to walk in hospital room?: Total Help needed climbing 3-5 steps with a railing? : Total 6 Click Score: 7    End of Session Equipment Utilized During Treatment: Gait belt Activity Tolerance: Patient limited by pain Patient left: in chair;with call bell/phone within reach;with chair alarm set;with family/visitor present Nurse Communication: Mobility status PT Visit Diagnosis: Unsteadiness on feet (R26.81);Pain;Difficulty in walking, not elsewhere classified (R26.2) Pain - Right/Left: Right Pain - part of body: Hip     Time: 7829-5621 PT Time Calculation (min) (ACUTE ONLY): 29 min  Charges:    $Gait Training: 23-37 mins PT General Charges $$ ACUTE PT VISIT: 1 Visit                     Conni Slipper, PT, DPT Acute Rehabilitation Services Secure Chat Preferred Office: [phone]    Vernona Rieger  D Berania Peedin 12/14/2022, 1:28 PM

## 2022-12-14 NOTE — Progress Notes (Signed)
[facility]                                                                              [name], is a 89 y.o. 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  On 12/3, patient was brought to ED at [facility] by EMS after on denies fall leading to immediate right hip pain.  Patient had repeat to that he was trying to ambulate when he slipped and fell, unsure if he hit his head, did not pass out. In ED, afebrile, stable. X-ray of the hip showed fracture of the right superior and inferior pubic rami CT pelvis showed acute nondisplaced fracture deformity involving the medial aspect of the right iliac bone which extends to involve the superior and medial aspect of the right S2 block.  Additional fractures of the right superior and right inferior pubic rami.  7.7 cmx 2.8 cm x5.9 cm hematoma along the medial aspect of right testicular, adjacent to the urinary bladder. CXR showed mild basilar atelectasis and moderate-sized hiatal hernia. Patient was transferred to [facility] for orthopedics evaluation  Assessment & Plan    Principal Problem: Right acetabulum/iliac bone fracture (HCC) Right superior and inferior pubic rami fracture Periacetabular hematoma -Secondary to unwitnessed mechanical fall -Orthopedics consulted, recommended initial nonoperative management with the WBAT for transfers and follow-up with Dr. Jena Gauss in 2 weeks. -Continue pain control, physical therapy, bowel regimen  -Patient will need skilled nursing facility rehab after discharge.  Active Problems:   Essential hypertension,  CAD, hyperlipidemia -Hold aspirin due to periacetabular hematoma -Not on any statin -BP soft but stable, continue Lopressor.   -Continue to hold Lasix, losartan     Diabetes mellitus type 2 with complications (HCC) -Hold glimepiride -Hemoglobin A1c 6.9 CBG (last 3)  Recent Labs    12/13/22 2323 12/14/22 0437 12/14/22 0824  GLUCAP 170* 179* 244*  -Continue sliding scale insulin while inpatient, increased to moderate -Add meal coverage 3 units 3 times daily AC    Myeloproliferative disorder (HCC) -Follows outpatient with Dr. Arbutus Ped -Continue hydroxyurea 500 mg daily    Parkinsonism (HCC) -Continue Sinemet  Hiatal hernia -Continue dysphagia 3 diet, add PPI  Constipation -Continue MiraLAX, Senokot -Add lactulose as needed for severe constipation  Estimated body mass index is 25.77 kg/m as calculated from the following:   Height as of 02/19/22: 6' (1.829 m).   Weight as of 10/01/21: 86.2 kg.  Code Status: Full code DVT Prophylaxis:  enoxaparin (LOVENOX) injection 40 mg Start: 12/12/22 0800   Level of Care: Level of care: Med-Surg Family Communication: Updated patient's son at the bedside Disposition Plan:      Remains inpatient appropriate: Will need skilled nursing facility, from ALF Likely DC on Monday  Procedures:  Consultants:   Orthopedics Antimicrobials:   Anti-infectives (From admission, onward)    None          Medications  carbidopa-levodopa  2 tablet Oral BID   enoxaparin (LOVENOX) injection  40 mg Subcutaneous Q24H   hydroxyurea  500 mg Oral Daily   insulin aspart  0-15 Units Subcutaneous TID WC   insulin aspart  0-5 Units Subcutaneous QHS   metoprolol tartrate  25 mg Oral BID   pantoprazole  40 mg Oral Q0600   polyethylene glycol  17 g Oral Daily   senna  1 tablet Oral BID      Subjective:   [name] was seen and examined today.  Resting, son at the bedside.  Requesting for PT evaluation.  No acute issues overnight.  +  Constipation   Objective:   Vitals:   12/13/22 0819 12/13/22 1550 12/14/22 0436 12/14/22 0821  BP: 124/66 126/62 (!) 119/56 112/60  Pulse: 87 73 85   Resp: 16 20 18    Temp: 98.4 F (36.9 C) 98.2 F (36.8 C) 98.3 F (36.8 C) 97.8 F (36.6 C)  TempSrc: Oral Oral Oral Oral  SpO2: 94% 96% 95%     Intake/Output Summary (Last 24 hours) at 12/14/2022 1220 Last data filed at 12/13/2022 1732 Gross per 24 hour  Intake 480 ml  Output 500 ml  Net -20 ml     Wt Readings from Last 3 Encounters:  10/01/21 86.2 kg  09/20/21 90 kg  02/21/21 90 kg    Physical Exam General: Alert and oriented, NAD, appears comfortable Cardiovascular: S1 S2 clear, RRR.  Respiratory: CTAB Gastrointestinal: Soft, nontender, nondistended, NBS Ext: no pedal edema bilaterally Neuro: no new deficits Psych: Normal affect     Data Reviewed:  I have personally reviewed following labs    CBC Lab Results  Component Value Date   WBC 17.1 (H) 12/14/2022   RBC 5.16 12/14/2022   HGB 15.5 12/14/2022   HCT 46.7 12/14/2022   MCV 90.5 12/14/2022   MCH 30.0 12/14/2022   PLT 600 (H) 12/14/2022   MCHC 33.2 12/14/2022   RDW 16.9 (H) 12/14/2022   LYMPHSABS 1.9 12/11/2022   MONOABS 1.4 (H) 12/11/2022   EOSABS 0.3 12/11/2022   BASOSABS 0.2 (H) 12/11/2022     Last metabolic panel Lab Results  Component Value Date   NA 130 (L) 12/14/2022   K 4.6 12/14/2022   CL 101 12/14/2022   CO2 19 (L) 12/14/2022   BUN 40 (H) 12/14/2022   CREATININE 1.21 12/14/2022   GLUCOSE 169 (H) 12/14/2022   GFRNONAA 58 (L) 12/14/2022   GFRAA 49 (L) 06/17/2019   CALCIUM 9.3 12/14/2022   PROT 8.1 01/01/2022   ALBUMIN 4.0 01/01/2022   LABGLOB 4.1 01/01/2022   AGRATIO 1.0 (L) 01/01/2022   BILITOT 0.6 01/01/2022   ALKPHOS 85 01/01/2022   AST 10 01/01/2022   ALT 7 01/01/2022   ANIONGAP 10 12/14/2022    CBG (last 3)  Recent Labs    12/13/22 2323 12/14/22 0437 12/14/22 0824  GLUCAP 170* 179* 244*      Coagulation  Profile: No results for input(s): "INR", "PROTIME" in the last 168 hours.   Radiology Studies: I have personally reviewed the imaging studies  No results found.     Thad Ranger M.D. [facility] 12/14/2022, 12:20 PM  Available via Epic secure chat 7am-7pm After 7 pm, please refer to night coverage provider listed on amion.

## 2022-12-15 DIAGNOSIS — S32591A Other specified fracture of right pubis, initial encounter for closed fracture: Secondary | ICD-10-CM | POA: Diagnosis not present

## 2022-12-15 DIAGNOSIS — D72829 Elevated white blood cell count, unspecified: Secondary | ICD-10-CM | POA: Diagnosis not present

## 2022-12-15 DIAGNOSIS — S32401A Unspecified fracture of right acetabulum, initial encounter for closed fracture: Secondary | ICD-10-CM | POA: Diagnosis not present

## 2022-12-15 DIAGNOSIS — W19XXXA Unspecified fall, initial encounter: Secondary | ICD-10-CM | POA: Diagnosis not present

## 2022-12-15 LAB — CBC
HCT: 47.2 % (ref 39.0–52.0)
Hemoglobin: 15.5 g/dL (ref 13.0–17.0)
MCH: 30 pg (ref 26.0–34.0)
MCHC: 32.8 g/dL (ref 30.0–36.0)
MCV: 91.3 fL (ref 80.0–100.0)
Platelets: 545 10*3/uL — ABNORMAL HIGH (ref 150–400)
RBC: 5.17 MIL/uL (ref 4.22–5.81)
RDW: 16.5 % — ABNORMAL HIGH (ref 11.5–15.5)
WBC: 16.3 10*3/uL — ABNORMAL HIGH (ref 4.0–10.5)
nRBC: 0 % (ref 0.0–0.2)

## 2022-12-15 LAB — BASIC METABOLIC PANEL
Anion gap: 9 (ref 5–15)
BUN: 41 mg/dL — ABNORMAL HIGH (ref 8–23)
CO2: 19 mmol/L — ABNORMAL LOW (ref 22–32)
Calcium: 9.1 mg/dL (ref 8.9–10.3)
Chloride: 102 mmol/L (ref 98–111)
Creatinine, Ser: 1.25 mg/dL — ABNORMAL HIGH (ref 0.61–1.24)
GFR, Estimated: 56 mL/min — ABNORMAL LOW (ref >60)
Glucose, Bld: 179 mg/dL — ABNORMAL HIGH (ref 70–99)
Potassium: 4.7 mmol/L (ref 3.5–5.1)
Sodium: 130 mmol/L — ABNORMAL LOW (ref 135–145)

## 2022-12-15 LAB — GLUCOSE, CAPILLARY
Glucose-Capillary: 176 mg/dL — ABNORMAL HIGH (ref 70–99)
Glucose-Capillary: 191 mg/dL — ABNORMAL HIGH (ref 70–99)
Glucose-Capillary: 191 mg/dL — ABNORMAL HIGH (ref 70–99)
Glucose-Capillary: 193 mg/dL — ABNORMAL HIGH (ref 70–99)
Glucose-Capillary: 193 mg/dL — ABNORMAL HIGH (ref 70–99)
Glucose-Capillary: 212 mg/dL — ABNORMAL HIGH (ref 70–99)
Glucose-Capillary: 234 mg/dL — ABNORMAL HIGH (ref 70–99)

## 2022-12-15 MED ORDER — GLUCERNA SHAKE PO LIQD
237.0000 mL | Freq: Three times a day (TID) | ORAL | Status: DC
  Administered 2022-12-15 – 2022-12-16 (×3): 237 mL via ORAL
  Filled 2022-12-15: qty 237

## 2022-12-15 MED ORDER — FLEET ENEMA RE ENEM
1.0000 | ENEMA | Freq: Once | RECTAL | Status: AC
  Administered 2022-12-15: 1 via RECTAL
  Filled 2022-12-15: qty 1

## 2022-12-15 NOTE — Plan of Care (Signed)
  Problem: Education: Goal: Ability to describe self-care measures that may prevent or decrease complications (Diabetes Survival Skills Education) will improve Outcome: Progressing Goal: Individualized Educational Video(s) Outcome: Progressing   Problem: Nutritional: Goal: Maintenance of adequate nutrition will improve Outcome: Progressing Goal: Progress toward achieving an optimal weight will improve Outcome: Progressing   Problem: Skin Integrity: Goal: Risk for impaired skin integrity will decrease Outcome: Progressing

## 2022-12-15 NOTE — Progress Notes (Addendum)
[facility]                                                                              [name], is a 89 y.o. 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  On 12/3, patient was brought to ED at [facility] by EMS after on denies fall leading to immediate right hip pain.  Patient had repeat to that he was trying to ambulate when he slipped and fell, unsure if he hit his head, did not pass out. In ED, afebrile, stable. X-ray of the hip showed fracture of the right superior and inferior pubic rami CT pelvis showed acute nondisplaced fracture deformity involving the medial aspect of the right iliac bone which extends to involve the superior and medial aspect of the right S2 block.  Additional fractures of the right superior and right inferior pubic rami.  7.7 cmx 2.8 cm x5.9 cm hematoma along the medial aspect of right testicular, adjacent to the urinary bladder. CXR showed mild basilar atelectasis and moderate-sized hiatal hernia. Patient was transferred to [facility] for orthopedics evaluation  Assessment & Plan    Principal Problem: Right acetabulum/iliac bone fracture (HCC) Right superior and inferior pubic rami fracture Periacetabular hematoma -Secondary to unwitnessed mechanical fall -Orthopedics consulted, recommended initial nonoperative management with the WBAT for transfers and follow-up with Dr. Jena Gauss in 2 weeks. -Continue pain control, PT OT, bowel regimen -Awaiting SNF  Active Problems:   Essential hypertension, CAD, hyperlipidemia -Hold aspirin due to periacetabular  hematoma -Not on any statin -BP stable, continue Lopressor.  Holding Lasix, losartan     Diabetes mellitus type 2 with complications (HCC) -Hold glimepiride -Hemoglobin A1c 6.9 CBG (last 3)  Recent Labs    12/15/22 0423 12/15/22 0704 12/15/22 1131  GLUCAP 193* 212* 176*   -Continue moderate SSI, NovoLog 3 units 3 times daily AC    Myeloproliferative disorder (HCC) -Follows outpatient with Dr. Arbutus Ped -Continue hydroxyurea 500 mg daily    Parkinsonism (HCC) -Continue Sinemet  Hiatal hernia -Continue dysphagia 3 diet, add PPI  Constipation -Continue MiraLAX, Senokot -Add lactulose as needed for severe constipation  Estimated body mass index is 25.77 kg/m as calculated from the following:   Height as of 02/19/22: 6' (1.829 m).   Weight as of 10/01/21: 86.2 kg.  Code Status: Full code DVT Prophylaxis:  enoxaparin (LOVENOX) injection 40 mg Start: 12/12/22 0800   Level of Care: Level of care: Med-Surg Family Communication: Updated patient's son at the bedside Disposition Plan:      Remains inpatient appropriate: Awaiting SNF, likely Monday Procedures:    Consultants:   Orthopedics Antimicrobials:   Anti-infectives (From admission, onward)    None          Medications  carbidopa-levodopa  2 tablet Oral BID   enoxaparin (LOVENOX) injection  40 mg Subcutaneous Q24H   hydroxyurea  500 mg Oral Daily   insulin aspart  0-15 Units Subcutaneous TID WC   insulin aspart  0-5 Units Subcutaneous QHS   insulin aspart  3 Units Subcutaneous TID WC   metoprolol tartrate  25 mg Oral BID   pantoprazole  40 mg Oral Q0600   polyethylene glycol  17 g Oral Daily   senna  1 tablet Oral BID      Subjective:   Blu [name] was seen and examined today.  No acute complaints, poor appetite, did work with PT yesterday.  Son at the bedside.  Per patient, still constipated.  Objective:   Vitals:   12/14/22 1403 12/14/22 1947 12/15/22 0424 12/15/22 0737  BP: 111/67 (!)  108/57 124/65 131/64  Pulse: 78 (!) 104 80 95  Resp: 18 16 18 16   Temp: (!) 97.4 F (36.3 C) 98 F (36.7 C) 98.1 F (36.7 C) (!) 97.5 F (36.4 C)  TempSrc: Oral Oral Oral Oral  SpO2: 95% 95% 95% 94%    Intake/Output Summary (Last 24 hours) at 12/15/2022 1252 Last data filed at 12/15/2022 0827 Gross per 24 hour  Intake 480 ml  Output 1100 ml  Net -620 ml     Wt Readings from Last 3 Encounters:  10/01/21 86.2 kg  09/20/21 90 kg  02/21/21 90 kg   Physical Exam General: Alert and oriented x 3, NAD Cardiovascular: S1 S2 clear, RRR.  Respiratory: CTAB Gastrointestinal: Soft, nontender, nondistended, NBS Ext: no pedal edema bilaterally Neuro: no new deficits Psych: sleepy    Data Reviewed:  I have personally reviewed following labs    CBC Lab Results  Component Value Date   WBC 16.3 (H) 12/15/2022   RBC 5.17 12/15/2022   HGB 15.5 12/15/2022   HCT 47.2 12/15/2022   MCV 91.3 12/15/2022   MCH 30.0 12/15/2022   PLT 545 (H) 12/15/2022   MCHC 32.8 12/15/2022   RDW 16.5 (H) 12/15/2022   LYMPHSABS 1.9 12/11/2022   MONOABS 1.4 (H) 12/11/2022   EOSABS 0.3 12/11/2022   BASOSABS 0.2 (H) 12/11/2022     Last metabolic panel Lab Results  Component Value Date   NA 130 (L) 12/15/2022   K 4.7 12/15/2022   CL 102 12/15/2022   CO2 19 (L) 12/15/2022   BUN 41 (H) 12/15/2022   CREATININE 1.25 (H) 12/15/2022   GLUCOSE 179 (H) 12/15/2022   GFRNONAA 56 (L) 12/15/2022   GFRAA 49 (L) 06/17/2019   CALCIUM 9.1 12/15/2022   PROT 8.1 01/01/2022   ALBUMIN 4.0 01/01/2022   LABGLOB 4.1 01/01/2022   AGRATIO 1.0 (L) 01/01/2022   BILITOT 0.6 01/01/2022   ALKPHOS 85 01/01/2022   AST 10 01/01/2022   ALT 7 01/01/2022   ANIONGAP 9 12/15/2022    CBG (last 3)  Recent Labs    12/15/22 0423 12/15/22 0704 12/15/22 1131  GLUCAP 193* 212* 176*      Coagulation Profile: No results for input(s): "INR", "PROTIME" in the last 168 hours.   Radiology Studies: I have personally  reviewed the imaging studies  No results found.     [name] M.D. [facility] 12/15/2022, 12:52 PM  Available via Epic secure chat 7am-7pm After 7 pm, please refer to night coverage provider listed on amion.

## 2022-12-15 NOTE — Plan of Care (Signed)
  Problem: Education: Goal: Ability to describe self-care measures that may prevent or decrease complications (Diabetes Survival Skills Education) will improve Outcome: Progressing   

## 2022-12-16 DIAGNOSIS — S32591S Other specified fracture of right pubis, sequela: Secondary | ICD-10-CM | POA: Diagnosis not present

## 2022-12-16 DIAGNOSIS — L89153 Pressure ulcer of sacral region, stage 3: Secondary | ICD-10-CM | POA: Diagnosis not present

## 2022-12-16 DIAGNOSIS — G629 Polyneuropathy, unspecified: Secondary | ICD-10-CM | POA: Diagnosis not present

## 2022-12-16 DIAGNOSIS — Z7401 Bed confinement status: Secondary | ICD-10-CM | POA: Diagnosis not present

## 2022-12-16 DIAGNOSIS — E875 Hyperkalemia: Secondary | ICD-10-CM | POA: Diagnosis not present

## 2022-12-16 DIAGNOSIS — W19XXXA Unspecified fall, initial encounter: Secondary | ICD-10-CM | POA: Diagnosis not present

## 2022-12-16 DIAGNOSIS — R531 Weakness: Secondary | ICD-10-CM | POA: Diagnosis not present

## 2022-12-16 DIAGNOSIS — G20C Parkinsonism, unspecified: Secondary | ICD-10-CM | POA: Diagnosis not present

## 2022-12-16 DIAGNOSIS — D471 Chronic myeloproliferative disease: Secondary | ICD-10-CM | POA: Diagnosis not present

## 2022-12-16 DIAGNOSIS — S32591A Other specified fracture of right pubis, initial encounter for closed fracture: Secondary | ICD-10-CM | POA: Diagnosis not present

## 2022-12-16 DIAGNOSIS — S32401A Unspecified fracture of right acetabulum, initial encounter for closed fracture: Secondary | ICD-10-CM | POA: Diagnosis not present

## 2022-12-16 DIAGNOSIS — S32401D Unspecified fracture of right acetabulum, subsequent encounter for fracture with routine healing: Secondary | ICD-10-CM | POA: Diagnosis not present

## 2022-12-16 DIAGNOSIS — K219 Gastro-esophageal reflux disease without esophagitis: Secondary | ICD-10-CM | POA: Diagnosis not present

## 2022-12-16 DIAGNOSIS — F32A Depression, unspecified: Secondary | ICD-10-CM | POA: Diagnosis not present

## 2022-12-16 DIAGNOSIS — G218 Other secondary parkinsonism: Secondary | ICD-10-CM | POA: Diagnosis not present

## 2022-12-16 DIAGNOSIS — D72829 Elevated white blood cell count, unspecified: Secondary | ICD-10-CM | POA: Diagnosis not present

## 2022-12-16 DIAGNOSIS — I10 Essential (primary) hypertension: Secondary | ICD-10-CM | POA: Diagnosis not present

## 2022-12-16 DIAGNOSIS — R1310 Dysphagia, unspecified: Secondary | ICD-10-CM | POA: Diagnosis not present

## 2022-12-16 DIAGNOSIS — L89320 Pressure ulcer of left buttock, unstageable: Secondary | ICD-10-CM | POA: Diagnosis not present

## 2022-12-16 DIAGNOSIS — E1122 Type 2 diabetes mellitus with diabetic chronic kidney disease: Secondary | ICD-10-CM | POA: Diagnosis not present

## 2022-12-16 DIAGNOSIS — L603 Nail dystrophy: Secondary | ICD-10-CM | POA: Diagnosis not present

## 2022-12-16 DIAGNOSIS — S32409S Unspecified fracture of unspecified acetabulum, sequela: Secondary | ICD-10-CM | POA: Diagnosis not present

## 2022-12-16 DIAGNOSIS — L22 Diaper dermatitis: Secondary | ICD-10-CM | POA: Diagnosis not present

## 2022-12-16 DIAGNOSIS — E1151 Type 2 diabetes mellitus with diabetic peripheral angiopathy without gangrene: Secondary | ICD-10-CM | POA: Diagnosis not present

## 2022-12-16 DIAGNOSIS — R278 Other lack of coordination: Secondary | ICD-10-CM | POA: Diagnosis not present

## 2022-12-16 DIAGNOSIS — L89150 Pressure ulcer of sacral region, unstageable: Secondary | ICD-10-CM | POA: Diagnosis not present

## 2022-12-16 DIAGNOSIS — L89310 Pressure ulcer of right buttock, unstageable: Secondary | ICD-10-CM | POA: Diagnosis not present

## 2022-12-16 DIAGNOSIS — R630 Anorexia: Secondary | ICD-10-CM | POA: Diagnosis not present

## 2022-12-16 DIAGNOSIS — D75839 Thrombocytosis, unspecified: Secondary | ICD-10-CM | POA: Diagnosis not present

## 2022-12-16 DIAGNOSIS — R1312 Dysphagia, oropharyngeal phase: Secondary | ICD-10-CM | POA: Diagnosis not present

## 2022-12-16 DIAGNOSIS — M6281 Muscle weakness (generalized): Secondary | ICD-10-CM | POA: Diagnosis not present

## 2022-12-16 DIAGNOSIS — Z515 Encounter for palliative care: Secondary | ICD-10-CM | POA: Diagnosis not present

## 2022-12-16 DIAGNOSIS — S32511D Fracture of superior rim of right pubis, subsequent encounter for fracture with routine healing: Secondary | ICD-10-CM | POA: Diagnosis not present

## 2022-12-16 DIAGNOSIS — Z7984 Long term (current) use of oral hypoglycemic drugs: Secondary | ICD-10-CM | POA: Diagnosis not present

## 2022-12-16 DIAGNOSIS — L602 Onychogryphosis: Secondary | ICD-10-CM | POA: Diagnosis not present

## 2022-12-16 LAB — GLUCOSE, CAPILLARY
Glucose-Capillary: 202 mg/dL — ABNORMAL HIGH (ref 70–99)
Glucose-Capillary: 182 mg/dL — ABNORMAL HIGH (ref 70–99)

## 2022-12-16 MED ORDER — SENNA 8.6 MG PO TABS: 1.0000 | ORAL_TABLET | Freq: Two times a day (BID) | ORAL | Status: AC

## 2022-12-16 MED ORDER — LACTULOSE 10 GM/15ML PO SOLN: 20.0000 g | Freq: Two times a day (BID) | ORAL | Status: AC | PRN

## 2022-12-16 MED ORDER — FUROSEMIDE 40 MG PO TABS: 40.0000 mg | ORAL_TABLET | ORAL | Status: DC | PRN

## 2022-12-16 MED ORDER — METHOCARBAMOL 500 MG PO TABS: 500.0000 mg | ORAL_TABLET | Freq: Four times a day (QID) | ORAL | Status: AC | PRN

## 2022-12-16 MED ORDER — PANTOPRAZOLE SODIUM 40 MG PO TBEC: 40.0000 mg | DELAYED_RELEASE_TABLET | Freq: Every day | ORAL | Status: AC

## 2022-12-16 MED ORDER — METOPROLOL TARTRATE 25 MG PO TABS: 25.0000 mg | ORAL_TABLET | Freq: Two times a day (BID) | ORAL | Status: DC

## 2022-12-16 MED ORDER — OXYCODONE HCL 5 MG PO TABS: 5.0000 mg | ORAL_TABLET | Freq: Four times a day (QID) | ORAL | 0 refills | Status: DC | PRN

## 2022-12-16 NOTE — TOC Progression Note (Signed)
Transition of Care [facility]) - Progression Note    Patient Details  Name: ENRIKE POLITIS MRN: [id number] Date of Birth: 06/26/1932  Transition of Care [facility]) CM/SW Contact  Lorri Frederick, LCSW Phone Number: 12/16/2022, 8:20 AM  Clinical Narrative:   Drucie Opitz approved:12/3 - 95/2 [id number]#[id number].     Expected Discharge Plan: Skilled Nursing Facility Barriers to Discharge: Continued Medical Work up  Expected Discharge Plan and Services In-house Referral: Clinical Social Work   Post Acute Care Choice: Skilled Nursing Facility Living arrangements for the past 2 months: Assisted Living Facility Office manager)                                       Social Determinants of Health (SDOH) Interventions SDOH Screenings   Food Insecurity: No Food Insecurity (12/11/2022)  Housing: Low Risk  (12/11/2022)  Transportation Needs: No Transportation Needs (12/11/2022)  Utilities: Not At Risk (12/11/2022)  Tobacco Use: Medium Risk (12/10/2022)    Readmission Risk Interventions     No data to display

## 2022-12-16 NOTE — TOC Transition Note (Signed)
Transition of Care [facility]) - CM/SW Discharge Note   Patient Details  Name: [name] MRN: [id number] Date of Birth: Apr 05, 1932  Transition of Care [facility]) CM/SW Contact:  Lorri Frederick, LCSW Phone Number: 12/16/2022, 10:53 AM   Clinical Narrative:   Pt discharging to [place], room 303A.  RN call report to [phone].     Final next level of care: Skilled Nursing Facility Barriers to Discharge: Barriers Resolved   Patient Goals and CMS Choice   Choice offered to / list presented to : Patient, Adult Children (son [name])  Discharge Placement                Patient chooses bed at: [facility] Patient to be transferred to facility by: ptar Name of family member notified: son [name] Patient and family notified of of transfer: 12/16/22  Discharge Plan and Services Additional resources added to the After Visit Summary for   In-house Referral: Clinical Social Work   Post Acute Care Choice: Skilled Nursing Facility                               Social Determinants of Health (SDOH) Interventions SDOH Screenings   Food Insecurity: No Food Insecurity (12/11/2022)  Housing: Low Risk  (12/11/2022)  Transportation Needs: No Transportation Needs (12/11/2022)  Utilities: Not At Risk (12/11/2022)  Tobacco Use: Medium Risk (12/10/2022)     Readmission Risk Interventions     No data to display

## 2022-12-16 NOTE — Progress Notes (Signed)
Attempted to call report multiple times, number we have goes to [employer] clinical care coordinator voicemail.  Left message with unit number to call back

## 2022-12-16 NOTE — Progress Notes (Signed)
Physical Therapy Treatment Patient Details Name: [name] MRN: [id number] DOB: Jan 01, 1933 Today's Date: 12/16/2022   History of Present Illness Pt is an 89 y/o male who presents s/p fall at ALF. CT revealed nondisplaced fracture deformity involving the medial aspect of the right iliac bone which extends to involve the superior and medial aspect of the right S2 block.  Additional fractures of the right superior and right inferior pubic rami.  7.7 cm x 2.8 cm x 5.9 cm hematoma. Ortho evaluated and recommended non-op management with WBAT RLE for transfers only, and NWB RLE for ambulation. PMH significant for CAD, DM, HTN.    PT Comments  Pt seen for therapy just before transport arrived to take pt to SNF so did not assist pt with transfer to chair. Pt required max A +2 to come to sitting EOB. Worked on sit>stand with stedy to allow for increased use of UE's. Pt performed supine and seated there ex before returning to supine with max A +2 for transport. Defer further treatment to next venue of care.     If plan is discharge home, recommend the following: Two people to help with walking and/or transfers;Two people to help with bathing/dressing/bathroom;Assistance with cooking/housework;Assist for transportation;Help with stairs or ramp for entrance   Can travel by private vehicle     No  Equipment Recommendations  Other (comment) (TBD by next venue of care)    Recommendations for Other Services       Precautions / Restrictions Precautions Precautions: Fall Precaution Comments: Premedicate for pain Restrictions Weight Bearing Restrictions: Yes RLE Weight Bearing: Weight bearing as tolerated Other Position/Activity Restrictions: Ortho approved WBAT for transfers only, NWB for ambulation.     Mobility  Bed Mobility Overal bed mobility: Needs Assistance Bed Mobility: Supine to Sit, Sit to Supine, Rolling Rolling: Max assist   Supine to sit: +2 for physical assistance, +2 for  safety/equipment, Used rails, Max assist Sit to supine: Max assist, +2 for physical assistance   General bed mobility comments: more painful with rolling to R. Max A needed and vc's for use of rail. Max A for LE's off bed to come to sitting and to lift them up to return to supine. Mod A at trunk to come to upright, pt with good use of UE's    Transfers Overall transfer level: Needs assistance Equipment used: Ambulation equipment used Transfers: Sit to/from Stand Sit to Stand: Mod assist, +2 physical assistance, +2 safety/equipment, From elevated surface, Max assist, Via lift equipment           General transfer comment: mod A +2 for first sit to stand but needed max A +2 second time due to fatigue Transfer via Lift Equipment: Stedy  Ambulation/Gait               General Gait Details: contraindicated   Stairs             Wheelchair Mobility     Tilt Bed    Modified Rankin (Stroke Patients Only)       Balance Overall balance assessment: Needs assistance, Mild deficits observed, not formally tested Sitting-balance support: Feet supported, No upper extremity supported Sitting balance-Leahy Scale: Poor Sitting balance - Comments: leans to the R sitting unsupported, posterior lean with poor tolerance for upright Postural control: Right lateral lean, Posterior lean Standing balance support: Bilateral upper extremity supported, During functional activity, Reliant on assistive device for balance Standing balance-Leahy Scale: Poor  Cognition Arousal: Alert Behavior During Therapy: WFL for tasks assessed/performed Overall Cognitive Status: Impaired/Different from baseline Area of Impairment: Attention, Following commands, Safety/judgement, Awareness, Problem solving, Memory                   Current Attention Level: Sustained Memory: Decreased short-term memory Following Commands: Follows one step commands  consistently, Follows one step commands with increased time (and repetition of cue) Safety/Judgement: Decreased awareness of safety Awareness: Emergent Problem Solving: Slow processing, Decreased initiation, Requires verbal cues, Requires tactile cues, Difficulty sequencing General Comments: Internally distracted by anticipation of pain at times. Able to follow simple commands with increased time, oriented.        Exercises General Exercises - Lower Extremity Ankle Circles/Pumps: AROM, Both, 10 reps, Supine Quad Sets: AROM, Both, 5 reps, Supine Heel Slides: AAROM, Left, 5 reps, Supine    General Comments General comments (skin integrity, edema, etc.): VSS. Transport arrived to take pt to SNF so no transfer to recliner      Pertinent Vitals/Pain Pain Assessment Pain Assessment: Faces Faces Pain Scale: Hurts even more Pain Location: R pelvis/hip area with movement and generalized Pain Descriptors / Indicators: Sore, Discomfort, Grimacing, Guarding Pain Intervention(s): Limited activity within patient's tolerance, Monitored during session    Home Living Family/patient expects to be discharged to:: Skilled nursing facility Living Arrangements: Children                      Prior Function            PT Goals (current goals can now be found in the care plan section) Acute Rehab PT Goals Patient Stated Goal: Get better, decrease pain PT Goal Formulation: With patient/family Time For Goal Achievement: 12/26/22 Potential to Achieve Goals: Good Progress towards PT goals: Progressing toward goals    Frequency    Min 1X/week      PT Plan      Co-evaluation              AM-PAC PT "6 Clicks" Mobility   Outcome Measure  Help needed turning from your back to your side while in a flat bed without using bedrails?: A Lot Help needed moving from lying on your back to sitting on the side of a flat bed without using bedrails?: Total Help needed moving to and from a  bed to a chair (including a wheelchair)?: Total Help needed standing up from a chair using your arms (e.g., wheelchair or bedside chair)?: Total Help needed to walk in hospital room?: Total Help needed climbing 3-5 steps with a railing? : Total 6 Click Score: 7    End of Session Equipment Utilized During Treatment: Gait belt Activity Tolerance: Patient tolerated treatment well;Patient limited by fatigue Patient left: with call bell/phone within reach;in bed Nurse Communication: Mobility status PT Visit Diagnosis: Unsteadiness on feet (R26.81);Pain;Difficulty in walking, not elsewhere classified (R26.2) Pain - Right/Left:  (bilateral) Pain - part of body: Hip     Time: 1210-1228 PT Time Calculation (min) (ACUTE ONLY): 18 min  Charges:    $Therapeutic Activity: 8-22 mins PT General Charges $$ ACUTE PT VISIT: 1 Visit                     Lyanne Co, PT  Acute Rehab Services Secure chat preferred Office [phone]    Lawana Chambers Ajwa Kimberley 12/16/2022, 12:58 PM

## 2022-12-16 NOTE — Discharge Summary (Signed)
Physician Discharge Summary   Patient: [name] MRN: [id number] DOB: [birth year]  Admit date:     12/10/2022  Discharge date: 12/16/22  Discharge Physician: Thad Ranger, MD    PCP: [name], MD   Recommendations at discharge:   Per orthopedics, initial nonoperative management recommended with weightbearing as tolerated for transfers.  Follow-up with Dr. Jena Gauss in 2 weeks for reevaluation. 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  On 12/3, patient was brought to ED at [facility] by EMS after on denies fall leading to immediate right hip pain.  Patient had repeat to that he was trying to ambulate when he slipped and fell, unsure if he hit his head, did not pass out. In ED, afebrile, stable. X-ray of the hip showed fracture of the right superior and inferior pubic rami CT pelvis showed acute nondisplaced fracture deformity involving the medial aspect of the right iliac bone which extends to involve the superior and medial aspect of the right S2 block.  Additional fractures of the right superior and right inferior pubic rami.  7.7 cmx 2.8 cm x5.9 cm hematoma along the medial aspect of right testicular, adjacent to the urinary bladder. CXR showed mild basilar atelectasis and moderate-sized hiatal hernia. Patient was transferred to [facility] for orthopedics evaluation   Assessment and Plan:   Right  acetabulum/iliac bone fracture (HCC) Right superior and inferior pubic rami fracture Periacetabular hematoma -Secondary to unwitnessed mechanical fall -Orthopedics was consulted, recommended initial nonoperative management with the WBAT for transfers and follow-up with Dr. Jena Gauss in 2 weeks. -Continue pain control, physical therapy, bowel regimen -Aspirin was held initially due to periacetabular hematoma, may resume     Essential hypertension, CAD, hyperlipidemia -Not on any statin -BP stable, continue Lopressor, dose was decreased to 25 mg twice daily due to soft BP.  - Holding Lasix, losartan       Diabetes mellitus type 2 with hyperglycemia  (HCC) -Continue glimepiride -Hemoglobin A1c 6.9 -Patient was on sliding scale insulin while inpatient     Myeloproliferative disorder (HCC) -Follows outpatient with Dr. Arbutus Ped -Continue hydroxyurea 500 mg daily     Parkinsonism (HCC) -Continue Sinemet   Hiatal hernia -Continue dysphagia 3 diet, added Protonix   Constipation -Continue MiraLAX, Senokot -Add lactulose as needed for severe constipation   Estimated body mass index is 25.77 kg/m as calculated from the following:   Height as of 02/19/22: 6' (1.829 m).   Weight as of 10/01/21: 86.2 kg.      Pain control - [employer] Controlled Substance Reporting System database was reviewed. and patient was instructed, not to drive, operate heavy machinery, perform activities at heights, swimming or participation in water activities or provide baby-sitting services while on Pain, Sleep and Anxiety Medications; until their outpatient Physician has advised to do so again. Also recommended to not to take more than prescribed Pain, Sleep and Anxiety Medications.  Consultants: Orthopedics Procedures performed: None Disposition: Skilled nursing facility Diet  recommendation: Dysphagia 3 diet with thin liquids  DISCHARGE MEDICATION: Allergies as of 12/16/2022   No Known Allergies       Medication List     STOP taking these medications    losartan 100 MG tablet Commonly known as: COZAAR       TAKE these medications    acetaminophen 650 MG CR tablet Commonly known as: TYLENOL Take 650 mg by mouth every 8 (eight) hours as needed for pain.   ammonium lactate 12 % lotion Commonly known as: LAC-HYDRIN 1 Application at bedtime. Apply to bilateral legs and feet; do not apply to right heel wound   ASPIRIN 81 PO Take 81 mg by mouth daily.   Biofreeze Roll-On 4 % Gel Generic drug: Menthol (Topical Analgesic) Apply 1 Application topically every 4 (four) hours as needed. For mild/moderate pain   carbidopa-levodopa 25-100 MG tablet Commonly known as: SINEMET IR Take 2 tablets by mouth 3 (three) times daily. At 8am, noon, 5pm What changed:  how much to take when to take this additional instructions   furosemide 40 MG tablet Commonly known as: LASIX Take 1 tablet (40 mg total) by mouth as needed for edema.   glimepiride 4 MG tablet Commonly known as: AMARYL Take 4 mg by mouth daily with breakfast.   hydroxyurea 500 MG capsule Commonly known as: HYDREA TAKE 1 CAPSULE BY MOUTH ONCE DAILY WITH FOOD   lactulose 10 GM/15ML solution Commonly known as: CHRONULAC Take 30 mLs (20 g total) by mouth 2 (two) times daily as needed for moderate constipation or severe constipation.   methocarbamol 500 MG tablet Commonly known as: ROBAXIN Take 1 tablet (500 mg total) by mouth every 6 (six) hours as needed for muscle spasms.   metoprolol tartrate 25 MG tablet Commonly known as: LOPRESSOR Take 1 tablet (25 mg total) by mouth 2 (two) times daily. What changed:  medication strength how much to take   multivitamin with minerals Tabs tablet Take 1 tablet by mouth every morning.   nystatin powder Commonly known as: MYCOSTATIN/NYSTOP Apply 1 Application topically 2 (two) times daily. Apply to groin   oxyCODONE 5 MG immediate release tablet Commonly known as: Oxy  IR/ROXICODONE Take 1 tablet (5 mg total) by mouth every 6 (six) hours as needed for moderate pain (pain score 4-6) or severe pain (pain score 7-10).   pantoprazole 40 MG tablet Commonly known as: PROTONIX Take 1 tablet (40 mg total) by mouth daily at 6 (six) AM. Start taking on: December 17, 2022   polyethylene glycol 17 g packet Commonly known as: MIRALAX / GLYCOLAX Take 17 g by mouth daily.   PROSOURCE PROTEIN PO Take 1 Bottle by mouth daily. Med Plus 2.0   senna 8.6 MG Tabs tablet Commonly known as: SENOKOT Take 1 tablet (8.6 mg total) by mouth 2 (two) times daily.   zinc oxide 20 % ointment Apply 1 Application topically 3 (three) times daily as needed for irritation. Apply to sacrum/coccyx every shift               Discharge Care Instructions  (From admission, onward)           Start     Ordered   12/16/22 0000  If the dressing is still on your incision site when you go home, remove it on the third day after your surgery date. Remove dressing if it begins to fall off, or if it is dirty or damaged before the third day.  12/16/22 0913            Follow-up Information     Haddix, Gillie Manners, MD. Schedule an appointment as soon as possible for a visit in 2 week(s).   Specialty: Orthopedic Surgery Why: for hospital follow-up Contact information: [address] [place] [address] [phone]         [name], MD. Schedule an appointment as soon as possible for a visit in 3 week(s).   Specialty: Internal Medicine Why: for hospital follow-up Contact information: [address] [place] [address] [phone]                Discharge Exam: S: Alert and awake, oriented, no acute complaints.  Overnight no issues.  No chest pain, shortness of breath, fevers or chills.  Tolerating diet.  BP (!) 119/53 (BP Location: Left Arm)   Pulse 66   Temp 98.4 F (36.9 C) (Oral)   Resp 16   SpO2 94%   Physical Exam General: Alert  and oriented x 3, NAD, appears comfortable Cardiovascular: S1 S2 clear, RRR.  Respiratory: CTAB, no wheezing, rales or rhonchi Gastrointestinal: Soft, nontender, nondistended, NBS Ext: no pedal edema bilaterally Neuro: no new deficits Psych: Normal affect   Condition at discharge: fair  The results of significant diagnostics from this hospitalization (including imaging, microbiology, ancillary and laboratory) are listed below for reference.   Imaging Studies: CT PELVIS WO CONTRAST  Result Date: 12/11/2022 CLINICAL DATA:  Unwitnessed fall. EXAM: CT PELVIS WITHOUT CONTRAST TECHNIQUE: Multidetector CT imaging of the pelvis was performed following the standard protocol without intravenous contrast. RADIATION DOSE REDUCTION: This exam was performed according to the departmental dose-optimization program which includes automated exposure control, adjustment of the mA and/or kV according to patient size and/or use of iterative reconstruction technique. COMPARISON:  None Available. FINDINGS: Urinary Tract:  No abnormality visualized. Bowel: There is no evidence of bowel dilatation. The appendix is not identified. Numerous noninflamed diverticula are seen throughout the visualized portions of the large bowel. Vascular/Lymphatic: No pathologically enlarged lymph nodes. There is marked severity calcification of the visualized portion of the abdominal aorta and bilateral common iliac arteries. Reproductive:  The prostate gland is mildly enlarged. Other:  None. Musculoskeletal: Acute nondisplaced fracture deformity is seen involving the medial aspect of the right iliac bone. This extends to involve the superior and medial aspects of the right acetabulum. Additional fractures of the right superior and right inferior pubic rami are seen. A 7.7 cm x 2.8 cm x 5.9 cm hematoma is seen along the medial aspect of the right acetabulum, adjacent to the urinary bladder. IMPRESSION: 1. Acute nondisplaced fracture deformity  involving the medial aspect of the right iliac bone which extends to involve the superior and medial aspects of the right acetabulum. 2. Additional fractures of the right superior and right inferior pubic rami. 3. 7.7 cm x 2.8 cm x 5.9 cm hematoma along the medial aspect of the right acetabulum, adjacent to the urinary bladder. 4. Colonic diverticulosis. 5. Aortic atherosclerosis. Aortic Atherosclerosis (ICD10-I70.0). Electronically Signed   By: Aram Candela M.D.   On: 12/11/2022 01:04   DG Chest Portable 1 View  Result Date: 12/11/2022 CLINICAL DATA:  Status post fall. EXAM: PORTABLE CHEST 1 VIEW COMPARISON:  February 09, 2014 FINDINGS: The heart size and mediastinal contours are within normal limits. Mild, diffuse, chronic appearing increased lung markings are seen. Mild atelectasis is noted within the left lung base. Small multifocal areas of atelectasis and/or  infiltrate are also seen scattered throughout the right lung. A moderate sized hiatal hernia is suspected within the retrocardiac region of the left lung base. No pleural effusion or pneumothorax is identified. A chronic fracture deformity of the left humeral head and neck is seen. No acute osseous abnormalities are identified. IMPRESSION: 1. Mild left basilar atelectasis. 2. Small multifocal areas of right lung atelectasis and/or infiltrate. 3. Findings suspicious for a moderate sized hiatal hernia. Correlation with nonemergent abdomen and pelvis CT is recommended. Electronically Signed   By: Aram Candela M.D.   On: 12/11/2022 00:56   DG Hip Unilat  With Pelvis 2-3 Views Right  Result Date: 12/10/2022 CLINICAL DATA:  Hip pain after a fall.  Slip and fall injury. EXAM: DG HIP (WITH OR WITHOUT PELVIS) 2-3V RIGHT COMPARISON:  None Available. FINDINGS: Mild degenerative changes in the lower lumbar spine and hips. The right hip appears intact. No evidence of acute fracture or dislocation. Fractures are demonstrated in the right superior and  inferior pubic rami, likely acute. SI joints and symphysis pubis are not displaced. No focal bone lesion or bone destruction. Vascular calcifications. IMPRESSION: Fractures of the right superior and inferior pubic rami, probably acute. Otherwise, no acute fracture or dislocation of the right hip. Mild degenerative changes. Electronically Signed   By: Burman Nieves M.D.   On: 12/10/2022 23:59    Microbiology: No results found for this or any previous visit.  Labs: CBC: Recent Labs  Lab 12/11/22 0030 12/11/22 0051 12/11/22 0858 12/12/22 0504 12/13/22 0554 12/14/22 0354 12/15/22 0555  WBC 20.7*  --  20.0* 19.8* 19.5* 17.1* 16.3*  NEUTROABS 16.5*  --   --   --   --   --   --   HGB 16.5   < > 15.5 16.0 15.3 15.5 15.5  HCT 52.5*   < > 48.1 50.0 46.5 46.7 47.2  MCV 91.9  --  91.3 90.6 89.8 90.5 91.3  PLT 778*  --  698* 812* 713* 600* 545*   < > = values in this interval not displayed.   Basic Metabolic Panel: Recent Labs  Lab 12/11/22 1131 12/12/22 0504 12/13/22 0554 12/14/22 0354 12/15/22 0555  NA 130* 131* 130* 130* 130*  K 4.6 4.6 4.7 4.6 4.7  CL 101 100 102 101 102  CO2 19* 22 19* 19* 19*  GLUCOSE 177* 111* 151* 169* 179*  BUN 24* 34* 40* 40* 41*  CREATININE 1.13 1.41* 1.27* 1.21 1.25*  CALCIUM 9.6 9.4 9.5 9.3 9.1   Liver Function Tests: No results for input(s): "AST", "ALT", "ALKPHOS", "BILITOT", "PROT", "ALBUMIN" in the last 168 hours. CBG: Recent Labs  Lab 12/15/22 1659 12/15/22 1956 12/15/22 2331 12/16/22 0350 12/16/22 0739  GLUCAP 193* 191* 191* 202* 182*    Discharge time spent: greater than 30 minutes.  Signed: Thad Ranger, MD [facility] 12/16/2022

## 2022-12-16 NOTE — Care Management Important Message (Signed)
Important Message  Patient Details  Name: [name] MRN: [id number] Date of Birth: 31-Jul-1932   Important Message Given:  Yes - Medicare IM     Sherilyn Banker 12/16/2022, 4:13 PM

## 2022-12-18 DIAGNOSIS — L22 Diaper dermatitis: Secondary | ICD-10-CM | POA: Diagnosis not present

## 2022-12-18 DIAGNOSIS — G218 Other secondary parkinsonism: Secondary | ICD-10-CM | POA: Diagnosis not present

## 2022-12-18 DIAGNOSIS — S32511D Fracture of superior rim of right pubis, subsequent encounter for fracture with routine healing: Secondary | ICD-10-CM | POA: Diagnosis not present

## 2022-12-18 DIAGNOSIS — D471 Chronic myeloproliferative disease: Secondary | ICD-10-CM | POA: Diagnosis not present

## 2022-12-19 DIAGNOSIS — R1310 Dysphagia, unspecified: Secondary | ICD-10-CM

## 2022-12-19 DIAGNOSIS — G629 Polyneuropathy, unspecified: Secondary | ICD-10-CM

## 2022-12-19 DIAGNOSIS — E1122 Type 2 diabetes mellitus with diabetic chronic kidney disease: Secondary | ICD-10-CM

## 2022-12-19 DIAGNOSIS — R531 Weakness: Secondary | ICD-10-CM

## 2022-12-19 DIAGNOSIS — R630 Anorexia: Secondary | ICD-10-CM

## 2022-12-24 DIAGNOSIS — R531 Weakness: Secondary | ICD-10-CM | POA: Diagnosis not present

## 2022-12-24 DIAGNOSIS — E1122 Type 2 diabetes mellitus with diabetic chronic kidney disease: Secondary | ICD-10-CM | POA: Diagnosis not present

## 2022-12-24 DIAGNOSIS — R630 Anorexia: Secondary | ICD-10-CM | POA: Diagnosis not present

## 2022-12-24 DIAGNOSIS — S32401D Unspecified fracture of right acetabulum, subsequent encounter for fracture with routine healing: Secondary | ICD-10-CM | POA: Diagnosis not present

## 2022-12-24 DIAGNOSIS — R1310 Dysphagia, unspecified: Secondary | ICD-10-CM | POA: Diagnosis not present

## 2022-12-24 DIAGNOSIS — G629 Polyneuropathy, unspecified: Secondary | ICD-10-CM | POA: Diagnosis not present

## 2023-01-02 ENCOUNTER — Encounter: Payer: Self-pay | Admitting: Internal Medicine

## 2023-01-03 DIAGNOSIS — S32409S Unspecified fracture of unspecified acetabulum, sequela: Secondary | ICD-10-CM | POA: Diagnosis not present

## 2023-01-03 DIAGNOSIS — Z515 Encounter for palliative care: Secondary | ICD-10-CM | POA: Diagnosis not present

## 2023-01-03 DIAGNOSIS — S32591S Other specified fracture of right pubis, sequela: Secondary | ICD-10-CM | POA: Diagnosis not present

## 2023-01-03 DIAGNOSIS — E875 Hyperkalemia: Secondary | ICD-10-CM | POA: Diagnosis not present

## 2023-01-13 DIAGNOSIS — L89150 Pressure ulcer of sacral region, unstageable: Secondary | ICD-10-CM | POA: Diagnosis not present

## 2023-01-13 DIAGNOSIS — L89320 Pressure ulcer of left buttock, unstageable: Secondary | ICD-10-CM | POA: Diagnosis not present

## 2023-01-13 DIAGNOSIS — L89310 Pressure ulcer of right buttock, unstageable: Secondary | ICD-10-CM | POA: Diagnosis not present

## 2023-01-20 DIAGNOSIS — D471 Chronic myeloproliferative disease: Secondary | ICD-10-CM | POA: Diagnosis not present

## 2023-01-20 DIAGNOSIS — S32511D Fracture of superior rim of right pubis, subsequent encounter for fracture with routine healing: Secondary | ICD-10-CM | POA: Diagnosis not present

## 2023-01-20 DIAGNOSIS — L89320 Pressure ulcer of left buttock, unstageable: Secondary | ICD-10-CM | POA: Diagnosis not present

## 2023-01-20 DIAGNOSIS — L89153 Pressure ulcer of sacral region, stage 3: Secondary | ICD-10-CM | POA: Diagnosis not present

## 2023-01-22 DIAGNOSIS — E1151 Type 2 diabetes mellitus with diabetic peripheral angiopathy without gangrene: Secondary | ICD-10-CM | POA: Diagnosis not present

## 2023-01-22 DIAGNOSIS — Z7984 Long term (current) use of oral hypoglycemic drugs: Secondary | ICD-10-CM | POA: Diagnosis not present

## 2023-01-22 DIAGNOSIS — L602 Onychogryphosis: Secondary | ICD-10-CM | POA: Diagnosis not present

## 2023-01-22 DIAGNOSIS — L603 Nail dystrophy: Secondary | ICD-10-CM | POA: Diagnosis not present

## 2023-01-27 DIAGNOSIS — L89153 Pressure ulcer of sacral region, stage 3: Secondary | ICD-10-CM | POA: Diagnosis not present

## 2023-01-27 DIAGNOSIS — L89320 Pressure ulcer of left buttock, unstageable: Secondary | ICD-10-CM | POA: Diagnosis not present

## 2023-02-03 DIAGNOSIS — L89320 Pressure ulcer of left buttock, unstageable: Secondary | ICD-10-CM | POA: Diagnosis not present

## 2023-02-03 DIAGNOSIS — L89153 Pressure ulcer of sacral region, stage 3: Secondary | ICD-10-CM | POA: Diagnosis not present

## 2023-02-10 DIAGNOSIS — L89153 Pressure ulcer of sacral region, stage 3: Secondary | ICD-10-CM | POA: Diagnosis not present

## 2023-02-11 DIAGNOSIS — S32401D Unspecified fracture of right acetabulum, subsequent encounter for fracture with routine healing: Secondary | ICD-10-CM | POA: Diagnosis not present

## 2023-02-17 DIAGNOSIS — L89153 Pressure ulcer of sacral region, stage 3: Secondary | ICD-10-CM | POA: Diagnosis not present

## 2023-02-19 DIAGNOSIS — G20C Parkinsonism, unspecified: Secondary | ICD-10-CM | POA: Diagnosis not present

## 2023-02-19 DIAGNOSIS — R1314 Dysphagia, pharyngoesophageal phase: Secondary | ICD-10-CM | POA: Diagnosis not present

## 2023-02-19 DIAGNOSIS — R1312 Dysphagia, oropharyngeal phase: Secondary | ICD-10-CM | POA: Diagnosis not present

## 2023-02-24 ENCOUNTER — Other Ambulatory Visit (HOSPITAL_COMMUNITY): Payer: Self-pay | Admitting: *Deleted

## 2023-02-24 DIAGNOSIS — S32511D Fracture of superior rim of right pubis, subsequent encounter for fracture with routine healing: Secondary | ICD-10-CM | POA: Diagnosis not present

## 2023-02-24 DIAGNOSIS — L89153 Pressure ulcer of sacral region, stage 3: Secondary | ICD-10-CM | POA: Diagnosis not present

## 2023-02-24 DIAGNOSIS — R1310 Dysphagia, unspecified: Secondary | ICD-10-CM

## 2023-02-24 DIAGNOSIS — G218 Other secondary parkinsonism: Secondary | ICD-10-CM | POA: Diagnosis not present

## 2023-02-24 DIAGNOSIS — D471 Chronic myeloproliferative disease: Secondary | ICD-10-CM | POA: Diagnosis not present

## 2023-02-25 DIAGNOSIS — R1314 Dysphagia, pharyngoesophageal phase: Secondary | ICD-10-CM | POA: Diagnosis not present

## 2023-02-25 DIAGNOSIS — R1312 Dysphagia, oropharyngeal phase: Secondary | ICD-10-CM | POA: Diagnosis not present

## 2023-02-25 DIAGNOSIS — G20C Parkinsonism, unspecified: Secondary | ICD-10-CM | POA: Diagnosis not present

## 2023-02-28 DIAGNOSIS — G20C Parkinsonism, unspecified: Secondary | ICD-10-CM | POA: Diagnosis not present

## 2023-02-28 DIAGNOSIS — R1312 Dysphagia, oropharyngeal phase: Secondary | ICD-10-CM | POA: Diagnosis not present

## 2023-02-28 DIAGNOSIS — R1314 Dysphagia, pharyngoesophageal phase: Secondary | ICD-10-CM | POA: Diagnosis not present

## 2023-03-03 DIAGNOSIS — D471 Chronic myeloproliferative disease: Secondary | ICD-10-CM | POA: Diagnosis not present

## 2023-03-03 DIAGNOSIS — S32511D Fracture of superior rim of right pubis, subsequent encounter for fracture with routine healing: Secondary | ICD-10-CM | POA: Diagnosis not present

## 2023-03-03 DIAGNOSIS — L89153 Pressure ulcer of sacral region, stage 3: Secondary | ICD-10-CM | POA: Diagnosis not present

## 2023-03-03 DIAGNOSIS — G218 Other secondary parkinsonism: Secondary | ICD-10-CM | POA: Diagnosis not present

## 2023-03-04 ENCOUNTER — Encounter: Payer: Self-pay | Admitting: Internal Medicine

## 2023-03-04 DIAGNOSIS — G20C Parkinsonism, unspecified: Secondary | ICD-10-CM | POA: Diagnosis not present

## 2023-03-04 DIAGNOSIS — R1312 Dysphagia, oropharyngeal phase: Secondary | ICD-10-CM | POA: Diagnosis not present

## 2023-03-04 DIAGNOSIS — R1314 Dysphagia, pharyngoesophageal phase: Secondary | ICD-10-CM | POA: Diagnosis not present

## 2023-03-05 DIAGNOSIS — D649 Anemia, unspecified: Secondary | ICD-10-CM | POA: Diagnosis not present

## 2023-03-05 DIAGNOSIS — G8911 Acute pain due to trauma: Secondary | ICD-10-CM | POA: Diagnosis not present

## 2023-03-07 DIAGNOSIS — E1122 Type 2 diabetes mellitus with diabetic chronic kidney disease: Secondary | ICD-10-CM | POA: Diagnosis not present

## 2023-03-07 DIAGNOSIS — R630 Anorexia: Secondary | ICD-10-CM | POA: Diagnosis not present

## 2023-03-07 DIAGNOSIS — R1310 Dysphagia, unspecified: Secondary | ICD-10-CM | POA: Diagnosis not present

## 2023-03-07 DIAGNOSIS — G629 Polyneuropathy, unspecified: Secondary | ICD-10-CM | POA: Diagnosis not present

## 2023-03-07 DIAGNOSIS — R531 Weakness: Secondary | ICD-10-CM | POA: Diagnosis not present

## 2023-03-10 DIAGNOSIS — R1312 Dysphagia, oropharyngeal phase: Secondary | ICD-10-CM | POA: Diagnosis not present

## 2023-03-10 DIAGNOSIS — G20C Parkinsonism, unspecified: Secondary | ICD-10-CM | POA: Diagnosis not present

## 2023-03-10 DIAGNOSIS — R1314 Dysphagia, pharyngoesophageal phase: Secondary | ICD-10-CM | POA: Diagnosis not present

## 2023-03-11 ENCOUNTER — Encounter: Payer: Self-pay | Admitting: Internal Medicine

## 2023-03-11 ENCOUNTER — Ambulatory Visit (HOSPITAL_COMMUNITY)
Admission: RE | Admit: 2023-03-11 | Discharge: 2023-03-11 | Disposition: A | Discharge status: Home / Self Care | Payer: Medicare HMO | Source: Ambulatory Visit | Attending: Internal Medicine | Admitting: Internal Medicine

## 2023-03-11 DIAGNOSIS — R1310 Dysphagia, unspecified: Secondary | ICD-10-CM | POA: Diagnosis not present

## 2023-03-11 DIAGNOSIS — R1312 Dysphagia, oropharyngeal phase: Secondary | ICD-10-CM | POA: Diagnosis not present

## 2023-03-11 NOTE — Progress Notes (Signed)
 Modified Barium Swallow Study  Patient Details  Name: [name] MRN: [id number] Date of Birth: [birth year]  Today's Date: 03/11/2023  Modified Barium Swallow completed.  Full report located under Chart Review in the Imaging Section.  History of Present Illness Larenzo A [name] is an 89 yo male presenting for an OP MBS from [facility]. Recently admitted to [facility] 12/2022 after a mechanical fall with R hip fx. CXR at that time showed moderate sized R hiatal hernia. Seen by SLP 12/11/22 with recommendations to continue baseline diet of Dys 3 solids with thin liquids. He reports he now receives meats specifically served minced and moist (Dys 2). PMH includes GERD, T2DM, HTN, HLD, CAD, PD, myeloproliferative disease suspicious for polycythemia vera with positive JAK2 mutation   Clinical Impression Pt presents with modereate oropharyngeal dysphagia characterized by impaired strength and coordination. BOT and soft palate elevation are incomplete resulting in nasopharyngeal and vallecular residue. He is unable to achieve complete laryngeal elevation, which contributes to consistent penetration of thin liquids. A cued cough is ineffective at mobilizing thin liquid penetrates from his vocal folds (PAS 5), which were noted during sips via cup and straw. Nectar thick liquids are penetrated above the vocal folds and are not expelled by cued coughing, throat clearance, or subswallows (PAS 3). Pt had reduced control and timing when given the barium tablet with thin liquids, causing silent aspiration (PAS 8). The esophageal sweep revealed a fluid filled proximal esophagus with eventual retrograde flow through the UES to the level of the pyriform sinuses. Recommend further esophageal assessment as well as consideration of providing pills with puree in light of suspected dysmotility, history of esophageal dilation x2, and silent aspiration. May consider primarily providing nectar thick liquids or initiating RMST to  strengthen his cough, but will defer treatment plan to SLP at pt's SNF. Provided general education to pt regarding aspiration and esophageal precautions, although feel he may benefit from further reinforcement.  DIGEST Swallow Severity Rating*  Safety: 2  Efficiency: 1  Overall Pharyngeal Swallow Severity: 2 (moderate)  1: mild; 2: moderate; 3: severe; 4: profound  *The Dynamic Imaging Grade of Swallowing Toxicity is standardized for the head and neck cancer population, however, demonstrates promising clinical applications across populations to standardize the clinical rating of pharyngeal swallow safety and severity.  Factors that may increase risk of adverse event in presence of aspiration [employer]aks & Clearance Coots 2021): Poor general health and/or compromised immunity;Reduced cognitive function;Limited mobility;Frail or deconditioned;Weak cough  Swallow Evaluation Recommendations Recommendations: PO diet PO Diet Recommendation: Dysphagia 2 (Finely chopped);Dysphagia 3 (Mechanical soft);Thin liquids (Level 0);Mildly thick liquids (Level 2, nectar thick) Liquid Administration via: Cup;Straw Medication Administration: Whole meds with puree Supervision: Staff to assist with self-feeding;Full supervision/cueing for swallowing strategies Swallowing strategies  : Minimize environmental distractions;Slow rate;Small bites/sips;Hard cough after swallowing;Avoid mixed consistencies Postural changes: Position pt fully upright for meals;Stay upright 30-60 min after meals Oral care recommendations: Oral care QID (4x/day) Recommended consults: Consider esophageal assessment      Gwynneth Aliment, M.A., CF-SLP Speech Language Pathology, Acute Rehabilitation Services  Secure Chat preferred [phone]  03/11/2023,3:22 PM

## 2023-03-17 DIAGNOSIS — L89153 Pressure ulcer of sacral region, stage 3: Secondary | ICD-10-CM | POA: Diagnosis not present

## 2023-03-18 DIAGNOSIS — R1314 Dysphagia, pharyngoesophageal phase: Secondary | ICD-10-CM | POA: Diagnosis not present

## 2023-03-18 DIAGNOSIS — S32401D Unspecified fracture of right acetabulum, subsequent encounter for fracture with routine healing: Secondary | ICD-10-CM | POA: Diagnosis not present

## 2023-03-18 DIAGNOSIS — R1312 Dysphagia, oropharyngeal phase: Secondary | ICD-10-CM | POA: Diagnosis not present

## 2023-03-18 DIAGNOSIS — G20C Parkinsonism, unspecified: Secondary | ICD-10-CM | POA: Diagnosis not present

## 2023-03-19 DIAGNOSIS — S32591S Other specified fracture of right pubis, sequela: Secondary | ICD-10-CM | POA: Diagnosis not present

## 2023-03-19 DIAGNOSIS — E875 Hyperkalemia: Secondary | ICD-10-CM | POA: Diagnosis not present

## 2023-03-19 DIAGNOSIS — Z515 Encounter for palliative care: Secondary | ICD-10-CM | POA: Diagnosis not present

## 2023-03-19 DIAGNOSIS — S32409S Unspecified fracture of unspecified acetabulum, sequela: Secondary | ICD-10-CM | POA: Diagnosis not present

## 2023-03-24 DIAGNOSIS — D471 Chronic myeloproliferative disease: Secondary | ICD-10-CM | POA: Diagnosis not present

## 2023-03-24 DIAGNOSIS — E119 Type 2 diabetes mellitus without complications: Secondary | ICD-10-CM | POA: Diagnosis not present

## 2023-03-24 DIAGNOSIS — L89153 Pressure ulcer of sacral region, stage 3: Secondary | ICD-10-CM | POA: Diagnosis not present

## 2023-03-24 DIAGNOSIS — H35013 Changes in retinal vascular appearance, bilateral: Secondary | ICD-10-CM | POA: Diagnosis not present

## 2023-03-24 DIAGNOSIS — G218 Other secondary parkinsonism: Secondary | ICD-10-CM | POA: Diagnosis not present

## 2023-03-24 DIAGNOSIS — S32511D Fracture of superior rim of right pubis, subsequent encounter for fracture with routine healing: Secondary | ICD-10-CM | POA: Diagnosis not present

## 2023-03-26 DIAGNOSIS — D649 Anemia, unspecified: Secondary | ICD-10-CM | POA: Diagnosis not present

## 2023-03-26 DIAGNOSIS — E7800 Pure hypercholesterolemia, unspecified: Secondary | ICD-10-CM | POA: Diagnosis not present

## 2023-04-03 DIAGNOSIS — G218 Other secondary parkinsonism: Secondary | ICD-10-CM | POA: Diagnosis not present

## 2023-04-03 DIAGNOSIS — L89153 Pressure ulcer of sacral region, stage 3: Secondary | ICD-10-CM | POA: Diagnosis not present

## 2023-04-03 DIAGNOSIS — S32511D Fracture of superior rim of right pubis, subsequent encounter for fracture with routine healing: Secondary | ICD-10-CM | POA: Diagnosis not present

## 2023-04-03 DIAGNOSIS — D471 Chronic myeloproliferative disease: Secondary | ICD-10-CM | POA: Diagnosis not present

## 2023-04-03 DIAGNOSIS — L89154 Pressure ulcer of sacral region, stage 4: Secondary | ICD-10-CM | POA: Diagnosis not present

## 2023-04-08 DIAGNOSIS — G20A1 Parkinson's disease without dyskinesia, without mention of fluctuations: Secondary | ICD-10-CM

## 2023-04-08 DIAGNOSIS — K222 Esophageal obstruction: Secondary | ICD-10-CM

## 2023-04-08 DIAGNOSIS — K219 Gastro-esophageal reflux disease without esophagitis: Secondary | ICD-10-CM

## 2023-04-09 DIAGNOSIS — S32409S Unspecified fracture of unspecified acetabulum, sequela: Secondary | ICD-10-CM | POA: Diagnosis not present

## 2023-04-09 DIAGNOSIS — S32591S Other specified fracture of right pubis, sequela: Secondary | ICD-10-CM | POA: Diagnosis not present

## 2023-04-09 DIAGNOSIS — Z515 Encounter for palliative care: Secondary | ICD-10-CM | POA: Diagnosis not present

## 2023-04-09 DIAGNOSIS — E875 Hyperkalemia: Secondary | ICD-10-CM | POA: Diagnosis not present

## 2023-04-10 DIAGNOSIS — L89154 Pressure ulcer of sacral region, stage 4: Secondary | ICD-10-CM | POA: Diagnosis not present

## 2023-04-10 DIAGNOSIS — L988 Other specified disorders of the skin and subcutaneous tissue: Secondary | ICD-10-CM | POA: Diagnosis not present

## 2023-04-10 DIAGNOSIS — E1122 Type 2 diabetes mellitus with diabetic chronic kidney disease: Secondary | ICD-10-CM | POA: Diagnosis not present

## 2023-04-10 DIAGNOSIS — R627 Adult failure to thrive: Secondary | ICD-10-CM | POA: Diagnosis not present

## 2023-04-17 DIAGNOSIS — L98498 Non-pressure chronic ulcer of skin of other sites with other specified severity: Secondary | ICD-10-CM | POA: Diagnosis not present

## 2023-04-17 DIAGNOSIS — S32511D Fracture of superior rim of right pubis, subsequent encounter for fracture with routine healing: Secondary | ICD-10-CM | POA: Diagnosis not present

## 2023-04-17 DIAGNOSIS — D471 Chronic myeloproliferative disease: Secondary | ICD-10-CM | POA: Diagnosis not present

## 2023-04-17 DIAGNOSIS — L988 Other specified disorders of the skin and subcutaneous tissue: Secondary | ICD-10-CM | POA: Diagnosis not present

## 2023-04-21 ENCOUNTER — Emergency Department (HOSPITAL_COMMUNITY)

## 2023-04-21 ENCOUNTER — Other Ambulatory Visit: Payer: Self-pay

## 2023-04-21 ENCOUNTER — Encounter (HOSPITAL_COMMUNITY): Payer: Self-pay | Admitting: *Deleted

## 2023-04-21 ENCOUNTER — Inpatient Hospital Stay (HOSPITAL_COMMUNITY)
Admission: EM | Admit: 2023-04-21 | Discharge: 2023-04-25 | DRG: 682 | Disposition: A | Discharge status: Hospice | Attending: Internal Medicine | Admitting: Internal Medicine

## 2023-04-21 DIAGNOSIS — R9389 Abnormal findings on diagnostic imaging of other specified body structures: Secondary | ICD-10-CM | POA: Diagnosis not present

## 2023-04-21 DIAGNOSIS — Z1152 Encounter for screening for COVID-19: Secondary | ICD-10-CM

## 2023-04-21 DIAGNOSIS — R918 Other nonspecific abnormal finding of lung field: Secondary | ICD-10-CM | POA: Diagnosis not present

## 2023-04-21 DIAGNOSIS — D471 Chronic myeloproliferative disease: Secondary | ICD-10-CM | POA: Diagnosis present

## 2023-04-21 DIAGNOSIS — R6520 Severe sepsis without septic shock: Secondary | ICD-10-CM | POA: Diagnosis not present

## 2023-04-21 DIAGNOSIS — R0689 Other abnormalities of breathing: Secondary | ICD-10-CM | POA: Diagnosis not present

## 2023-04-21 DIAGNOSIS — I4891 Unspecified atrial fibrillation: Secondary | ICD-10-CM | POA: Diagnosis present

## 2023-04-21 DIAGNOSIS — E876 Hypokalemia: Secondary | ICD-10-CM | POA: Diagnosis present

## 2023-04-21 DIAGNOSIS — E872 Acidosis, unspecified: Secondary | ICD-10-CM | POA: Diagnosis present

## 2023-04-21 DIAGNOSIS — E118 Type 2 diabetes mellitus with unspecified complications: Secondary | ICD-10-CM | POA: Diagnosis not present

## 2023-04-21 DIAGNOSIS — E1122 Type 2 diabetes mellitus with diabetic chronic kidney disease: Secondary | ICD-10-CM | POA: Diagnosis present

## 2023-04-21 DIAGNOSIS — E43 Unspecified severe protein-calorie malnutrition: Secondary | ICD-10-CM | POA: Diagnosis not present

## 2023-04-21 DIAGNOSIS — E440 Moderate protein-calorie malnutrition: Secondary | ICD-10-CM | POA: Insufficient documentation

## 2023-04-21 DIAGNOSIS — E785 Hyperlipidemia, unspecified: Secondary | ICD-10-CM | POA: Diagnosis present

## 2023-04-21 DIAGNOSIS — N1831 Chronic kidney disease, stage 3a: Secondary | ICD-10-CM | POA: Diagnosis not present

## 2023-04-21 DIAGNOSIS — Z7401 Bed confinement status: Secondary | ICD-10-CM

## 2023-04-21 DIAGNOSIS — I7121 Aneurysm of the ascending aorta, without rupture: Secondary | ICD-10-CM | POA: Diagnosis not present

## 2023-04-21 DIAGNOSIS — I10 Essential (primary) hypertension: Secondary | ICD-10-CM | POA: Diagnosis present

## 2023-04-21 DIAGNOSIS — L89323 Pressure ulcer of left buttock, stage 3: Secondary | ICD-10-CM | POA: Diagnosis present

## 2023-04-21 DIAGNOSIS — J984 Other disorders of lung: Secondary | ICD-10-CM | POA: Diagnosis not present

## 2023-04-21 DIAGNOSIS — Z9861 Coronary angioplasty status: Secondary | ICD-10-CM

## 2023-04-21 DIAGNOSIS — L89313 Pressure ulcer of right buttock, stage 3: Secondary | ICD-10-CM | POA: Diagnosis present

## 2023-04-21 DIAGNOSIS — N183 Chronic kidney disease, stage 3 unspecified: Secondary | ICD-10-CM | POA: Diagnosis present

## 2023-04-21 DIAGNOSIS — E860 Dehydration: Secondary | ICD-10-CM | POA: Diagnosis present

## 2023-04-21 DIAGNOSIS — Z7982 Long term (current) use of aspirin: Secondary | ICD-10-CM

## 2023-04-21 DIAGNOSIS — G20C Parkinsonism, unspecified: Secondary | ICD-10-CM | POA: Diagnosis present

## 2023-04-21 DIAGNOSIS — R739 Hyperglycemia, unspecified: Secondary | ICD-10-CM | POA: Diagnosis not present

## 2023-04-21 DIAGNOSIS — L89153 Pressure ulcer of sacral region, stage 3: Secondary | ICD-10-CM | POA: Diagnosis present

## 2023-04-21 DIAGNOSIS — K449 Diaphragmatic hernia without obstruction or gangrene: Secondary | ICD-10-CM | POA: Diagnosis present

## 2023-04-21 DIAGNOSIS — G9341 Metabolic encephalopathy: Secondary | ICD-10-CM | POA: Diagnosis present

## 2023-04-21 DIAGNOSIS — R6510 Systemic inflammatory response syndrome (SIRS) of non-infectious origin without acute organ dysfunction: Secondary | ICD-10-CM | POA: Diagnosis present

## 2023-04-21 DIAGNOSIS — Z79899 Other long term (current) drug therapy: Secondary | ICD-10-CM

## 2023-04-21 DIAGNOSIS — Z6821 Body mass index (BMI) 21.0-21.9, adult: Secondary | ICD-10-CM

## 2023-04-21 DIAGNOSIS — G20A1 Parkinson's disease without dyskinesia, without mention of fluctuations: Secondary | ICD-10-CM | POA: Diagnosis present

## 2023-04-21 DIAGNOSIS — R4182 Altered mental status, unspecified: Secondary | ICD-10-CM | POA: Diagnosis not present

## 2023-04-21 DIAGNOSIS — L89616 Pressure-induced deep tissue damage of right heel: Secondary | ICD-10-CM | POA: Diagnosis present

## 2023-04-21 DIAGNOSIS — R1310 Dysphagia, unspecified: Secondary | ICD-10-CM | POA: Diagnosis present

## 2023-04-21 DIAGNOSIS — E1142 Type 2 diabetes mellitus with diabetic polyneuropathy: Secondary | ICD-10-CM | POA: Diagnosis present

## 2023-04-21 DIAGNOSIS — K5730 Diverticulosis of large intestine without perforation or abscess without bleeding: Secondary | ICD-10-CM | POA: Diagnosis not present

## 2023-04-21 DIAGNOSIS — Z743 Need for continuous supervision: Secondary | ICD-10-CM | POA: Diagnosis not present

## 2023-04-21 DIAGNOSIS — I2510 Atherosclerotic heart disease of native coronary artery without angina pectoris: Secondary | ICD-10-CM | POA: Diagnosis present

## 2023-04-21 DIAGNOSIS — I6782 Cerebral ischemia: Secondary | ICD-10-CM | POA: Diagnosis not present

## 2023-04-21 DIAGNOSIS — N179 Acute kidney failure, unspecified: Principal | ICD-10-CM | POA: Diagnosis present

## 2023-04-21 DIAGNOSIS — I5032 Chronic diastolic (congestive) heart failure: Secondary | ICD-10-CM | POA: Diagnosis present

## 2023-04-21 DIAGNOSIS — A419 Sepsis, unspecified organism: Principal | ICD-10-CM

## 2023-04-21 DIAGNOSIS — R161 Splenomegaly, not elsewhere classified: Secondary | ICD-10-CM | POA: Diagnosis not present

## 2023-04-21 DIAGNOSIS — D45 Polycythemia vera: Secondary | ICD-10-CM | POA: Diagnosis present

## 2023-04-21 DIAGNOSIS — K219 Gastro-esophageal reflux disease without esophagitis: Secondary | ICD-10-CM | POA: Diagnosis present

## 2023-04-21 DIAGNOSIS — Z9181 History of falling: Secondary | ICD-10-CM

## 2023-04-21 DIAGNOSIS — R000 Tachycardia, unspecified: Secondary | ICD-10-CM | POA: Diagnosis not present

## 2023-04-21 DIAGNOSIS — E1165 Type 2 diabetes mellitus with hyperglycemia: Secondary | ICD-10-CM | POA: Diagnosis present

## 2023-04-21 DIAGNOSIS — I129 Hypertensive chronic kidney disease with stage 1 through stage 4 chronic kidney disease, or unspecified chronic kidney disease: Secondary | ICD-10-CM | POA: Diagnosis present

## 2023-04-21 DIAGNOSIS — Z87891 Personal history of nicotine dependence: Secondary | ICD-10-CM

## 2023-04-21 DIAGNOSIS — L8990 Pressure ulcer of unspecified site, unspecified stage: Secondary | ICD-10-CM | POA: Diagnosis present

## 2023-04-21 DIAGNOSIS — Z7984 Long term (current) use of oral hypoglycemic drugs: Secondary | ICD-10-CM

## 2023-04-21 DIAGNOSIS — Z515 Encounter for palliative care: Secondary | ICD-10-CM

## 2023-04-21 DIAGNOSIS — Z66 Do not resuscitate: Secondary | ICD-10-CM | POA: Diagnosis present

## 2023-04-21 LAB — CBC WITH DIFFERENTIAL/PLATELET
Abs Immature Granulocytes: 0.2 10*3/uL — ABNORMAL HIGH (ref 0.00–0.07)
Basophils Absolute: 0.1 10*3/uL (ref 0.0–0.1)
Basophils Relative: 0 %
Eosinophils Absolute: 0 10*3/uL (ref 0.0–0.5)
Eosinophils Relative: 0 %
HCT: 52.1 % — ABNORMAL HIGH (ref 39.0–52.0)
Hemoglobin: 16.9 g/dL (ref 13.0–17.0)
Immature Granulocytes: 1 %
Lymphocytes Relative: 5 %
Lymphs Abs: 0.8 10*3/uL (ref 0.7–4.0)
MCH: 33.7 pg (ref 26.0–34.0)
MCHC: 32.4 g/dL (ref 30.0–36.0)
MCV: 103.8 fL — ABNORMAL HIGH (ref 80.0–100.0)
Monocytes Absolute: 1.1 10*3/uL — ABNORMAL HIGH (ref 0.1–1.0)
Monocytes Relative: 6 %
Neutro Abs: 15.1 10*3/uL — ABNORMAL HIGH (ref 1.7–7.7)
Neutrophils Relative %: 88 %
Platelets: 690 10*3/uL — ABNORMAL HIGH (ref 150–400)
RBC: 5.02 MIL/uL (ref 4.22–5.81)
RDW: 15.9 % — ABNORMAL HIGH (ref 11.5–15.5)
WBC: 17.2 10*3/uL — ABNORMAL HIGH (ref 4.0–10.5)
nRBC: 0 % (ref 0.0–0.2)

## 2023-04-21 LAB — RESP PANEL BY RT-PCR (RSV, FLU A&B, COVID)  RVPGX2
Influenza A by PCR: NEGATIVE
Influenza B by PCR: NEGATIVE
Resp Syncytial Virus by PCR: NEGATIVE
SARS Coronavirus 2 by RT PCR: NEGATIVE

## 2023-04-21 LAB — CK: Total CK: 53 U/L (ref 49–397)

## 2023-04-21 LAB — COMPREHENSIVE METABOLIC PANEL WITH GFR
ALT: 9 U/L (ref 0–44)
AST: 14 U/L — ABNORMAL LOW (ref 15–41)
Albumin: 3.2 g/dL — ABNORMAL LOW (ref 3.5–5.0)
Alkaline Phosphatase: 75 U/L (ref 38–126)
Anion gap: 17 — ABNORMAL HIGH (ref 5–15)
BUN: 56 mg/dL — ABNORMAL HIGH (ref 8–23)
CO2: 20 mmol/L — ABNORMAL LOW (ref 22–32)
Calcium: 9.8 mg/dL (ref 8.9–10.3)
Chloride: 100 mmol/L (ref 98–111)
Creatinine, Ser: 1.42 mg/dL — ABNORMAL HIGH (ref 0.61–1.24)
GFR, Estimated: 48 mL/min — ABNORMAL LOW (ref >60)
Glucose, Bld: 604 mg/dL (ref 70–99)
Potassium: 4.3 mmol/L (ref 3.5–5.1)
Sodium: 137 mmol/L (ref 135–145)
Total Bilirubin: 1.3 mg/dL — ABNORMAL HIGH (ref 0.0–1.2)
Total Protein: 8.9 g/dL — ABNORMAL HIGH (ref 6.5–8.1)

## 2023-04-21 LAB — RESPIRATORY PANEL BY PCR

## 2023-04-21 LAB — BLOOD GAS, VENOUS
Acid-base deficit: 2.4 mmol/L — ABNORMAL HIGH (ref 0.0–2.0)
Bicarbonate: 21.7 mmol/L (ref 20.0–28.0)
O2 Saturation: 85.4 %
Patient temperature: 37
pCO2, Ven: 35 mmHg — ABNORMAL LOW (ref 44–60)
pH, Ven: 7.4 (ref 7.25–7.43)
pO2, Ven: 53 mmHg — ABNORMAL HIGH (ref 32–45)

## 2023-04-21 LAB — APTT: aPTT: 32 s (ref 24–36)

## 2023-04-21 LAB — GLUCOSE, CAPILLARY
Glucose-Capillary: 102 mg/dL — ABNORMAL HIGH (ref 70–99)
Glucose-Capillary: 105 mg/dL — ABNORMAL HIGH (ref 70–99)

## 2023-04-21 LAB — CBG MONITORING, ED
Glucose-Capillary: 576 mg/dL (ref 70–99)
Glucose-Capillary: 513 mg/dL (ref 70–99)
Glucose-Capillary: 457 mg/dL — ABNORMAL HIGH (ref 70–99)
Glucose-Capillary: 239 mg/dL — ABNORMAL HIGH (ref 70–99)
Glucose-Capillary: 417 mg/dL — ABNORMAL HIGH (ref 70–99)
Glucose-Capillary: 338 mg/dL — ABNORMAL HIGH (ref 70–99)
Glucose-Capillary: 416 mg/dL — ABNORMAL HIGH (ref 70–99)
Glucose-Capillary: 355 mg/dL — ABNORMAL HIGH (ref 70–99)
Glucose-Capillary: 320 mg/dL — ABNORMAL HIGH (ref 70–99)
Glucose-Capillary: 212 mg/dL — ABNORMAL HIGH (ref 70–99)

## 2023-04-21 LAB — PHOSPHORUS: Phosphorus: 1.5 mg/dL — ABNORMAL LOW (ref 2.5–4.6)

## 2023-04-21 LAB — TROPONIN I (HIGH SENSITIVITY)
Troponin I (High Sensitivity): 17 ng/L (ref <18)
Troponin I (High Sensitivity): 17 ng/L (ref <18)
Troponin I (High Sensitivity): 13 ng/L (ref <18)

## 2023-04-21 LAB — BASIC METABOLIC PANEL WITH GFR
Anion gap: 9 (ref 5–15)
BUN: 47 mg/dL — ABNORMAL HIGH (ref 8–23)
CO2: 23 mmol/L (ref 22–32)
Calcium: 9.5 mg/dL (ref 8.9–10.3)
Chloride: 109 mmol/L (ref 98–111)
Creatinine, Ser: 1.15 mg/dL (ref 0.61–1.24)
GFR, Estimated: >60 mL/min (ref >60)
Glucose, Bld: 314 mg/dL — ABNORMAL HIGH (ref 70–99)
Potassium: 3.4 mmol/L — ABNORMAL LOW (ref 3.5–5.1)
Sodium: 141 mmol/L (ref 135–145)

## 2023-04-21 LAB — I-STAT CG4 LACTIC ACID, ED
Lactic Acid, Venous: 2.1 mmol/L (ref 0.5–1.9)
Lactic Acid, Venous: 3.2 mmol/L (ref 0.5–1.9)

## 2023-04-21 LAB — PROCALCITONIN: Procalcitonin: <0.1 ng/mL

## 2023-04-21 LAB — BETA-HYDROXYBUTYRIC ACID: Beta-Hydroxybutyric Acid: 3.59 mmol/L — ABNORMAL HIGH (ref 0.05–0.27)

## 2023-04-21 LAB — MAGNESIUM: Magnesium: 2.1 mg/dL (ref 1.7–2.4)

## 2023-04-21 LAB — PROTIME-INR
INR: 1.3 — ABNORMAL HIGH (ref 0.8–1.2)
Prothrombin Time: 15.9 s — ABNORMAL HIGH (ref 11.4–15.2)

## 2023-04-21 LAB — LACTIC ACID, PLASMA: Lactic Acid, Venous: 3.4 mmol/L (ref 0.5–1.9)

## 2023-04-21 LAB — OSMOLALITY: Osmolality: 355 mosm/kg (ref 275–295)

## 2023-04-21 MED ORDER — HYDROCODONE-ACETAMINOPHEN 5-325 MG PO TABS
1.0000 | ORAL_TABLET | ORAL | Status: DC | PRN
  Administered 2023-04-23 – 2023-04-25 (×4): 2 via ORAL
  Filled 2023-04-21 (×4): qty 2

## 2023-04-21 MED ORDER — PANTOPRAZOLE SODIUM 40 MG PO TBEC
40.0000 mg | DELAYED_RELEASE_TABLET | Freq: Every day | ORAL | Status: DC
  Administered 2023-04-23 – 2023-04-24 (×2): 40 mg via ORAL
  Filled 2023-04-21 (×2): qty 1

## 2023-04-21 MED ORDER — ACETAMINOPHEN 650 MG RE SUPP: 650.0000 mg | Freq: Four times a day (QID) | RECTAL | Status: DC | PRN

## 2023-04-21 MED ORDER — INSULIN ASPART 100 UNIT/ML IJ SOLN: 0.0000 [IU] | Freq: Every day | INTRAMUSCULAR | Status: DC

## 2023-04-21 MED ORDER — ONDANSETRON HCL 4 MG/2ML IJ SOLN: 4.0000 mg | Freq: Four times a day (QID) | INTRAMUSCULAR | Status: DC | PRN

## 2023-04-21 MED ORDER — POLYETHYLENE GLYCOL 3350 17 G PO PACK
17.0000 g | PACK | Freq: Every day | ORAL | Status: DC
  Filled 2023-04-21 (×2): qty 1

## 2023-04-21 MED ORDER — VANCOMYCIN HCL IN DEXTROSE 1-5 GM/200ML-% IV SOLN
1000.0000 mg | Freq: Once | INTRAVENOUS | Status: AC
  Administered 2023-04-21: 1000 mg via INTRAVENOUS
  Filled 2023-04-21: qty 200

## 2023-04-21 MED ORDER — LACTATED RINGERS IV BOLUS
1000.0000 mL | Freq: Once | INTRAVENOUS | Status: AC
  Administered 2023-04-21: 1000 mL via INTRAVENOUS

## 2023-04-21 MED ORDER — CHLORHEXIDINE GLUCONATE CLOTH 2 % EX PADS
6.0000 | MEDICATED_PAD | Freq: Every day | CUTANEOUS | Status: DC
  Administered 2023-04-21 – 2023-04-25 (×5): 6 via TOPICAL

## 2023-04-21 MED ORDER — CARBIDOPA-LEVODOPA 25-100 MG PO TABS
1.0000 | ORAL_TABLET | Freq: Every day | ORAL | Status: DC
  Administered 2023-04-22: 1 via ORAL
  Filled 2023-04-21 (×2): qty 1

## 2023-04-21 MED ORDER — ACETAMINOPHEN 325 MG PO TABS
650.0000 mg | ORAL_TABLET | Freq: Four times a day (QID) | ORAL | Status: DC | PRN
  Administered 2023-04-23: 650 mg via ORAL
  Filled 2023-04-21 (×2): qty 2

## 2023-04-21 MED ORDER — SODIUM CHLORIDE 0.9 % IV SOLN: INTRAVENOUS | Status: AC

## 2023-04-21 MED ORDER — METOPROLOL TARTRATE 25 MG PO TABS
25.0000 mg | ORAL_TABLET | Freq: Two times a day (BID) | ORAL | Status: DC
  Administered 2023-04-22 – 2023-04-24 (×4): 25 mg via ORAL
  Filled 2023-04-21 (×7): qty 1

## 2023-04-21 MED ORDER — POTASSIUM CHLORIDE 10 MEQ/100ML IV SOLN
10.0000 meq | INTRAVENOUS | Status: AC
  Administered 2023-04-21 (×2): 10 meq via INTRAVENOUS
  Filled 2023-04-21 (×2): qty 100

## 2023-04-21 MED ORDER — SODIUM CHLORIDE 0.9 % IV SOLN
2.0000 g | Freq: Once | INTRAVENOUS | Status: AC
  Administered 2023-04-21: 2 g via INTRAVENOUS
  Filled 2023-04-21: qty 12.5

## 2023-04-21 MED ORDER — LACTATED RINGERS IV SOLN: 150.0000 mL/h | INTRAVENOUS | Status: DC

## 2023-04-21 MED ORDER — POTASSIUM PHOSPHATES 15 MMOLE/5ML IV SOLN
15.0000 mmol | Freq: Once | INTRAVENOUS | Status: AC
  Administered 2023-04-22: 15 mmol via INTRAVENOUS
  Filled 2023-04-21: qty 5

## 2023-04-21 MED ORDER — ORAL CARE MOUTH RINSE: 15.0000 mL | OROMUCOSAL | Status: DC | PRN

## 2023-04-21 MED ORDER — SENNA 8.6 MG PO TABS
1.0000 | ORAL_TABLET | Freq: Two times a day (BID) | ORAL | Status: DC
  Administered 2023-04-22 – 2023-04-25 (×5): 8.6 mg via ORAL
  Filled 2023-04-21 (×7): qty 1

## 2023-04-21 MED ORDER — INSULIN REGULAR(HUMAN) IN NACL 100-0.9 UT/100ML-% IV SOLN: INTRAVENOUS | Status: DC

## 2023-04-21 MED ORDER — THIAMINE HCL 100 MG/ML IJ SOLN
100.0000 mg | Freq: Every day | INTRAMUSCULAR | Status: DC
  Administered 2023-04-21 – 2023-04-24 (×4): 100 mg via INTRAVENOUS
  Filled 2023-04-21 (×4): qty 2

## 2023-04-21 MED ORDER — METRONIDAZOLE 500 MG/100ML IV SOLN
500.0000 mg | Freq: Two times a day (BID) | INTRAVENOUS | Status: DC
  Administered 2023-04-22 – 2023-04-24 (×6): 500 mg via INTRAVENOUS
  Filled 2023-04-21 (×6): qty 100

## 2023-04-21 MED ORDER — FENTANYL CITRATE PF 50 MCG/ML IJ SOSY: 12.5000 ug | PREFILLED_SYRINGE | INTRAMUSCULAR | Status: DC | PRN

## 2023-04-21 MED ORDER — LACTATED RINGERS IV SOLN: INTRAVENOUS | Status: DC

## 2023-04-21 MED ORDER — ALBUTEROL SULFATE (2.5 MG/3ML) 0.083% IN NEBU: 2.5000 mg | INHALATION_SOLUTION | RESPIRATORY_TRACT | Status: DC | PRN

## 2023-04-21 MED ORDER — LACTULOSE 10 GM/15ML PO SOLN: 20.0000 g | Freq: Two times a day (BID) | ORAL | Status: DC | PRN

## 2023-04-21 MED ORDER — ONDANSETRON HCL 4 MG PO TABS: 4.0000 mg | ORAL_TABLET | Freq: Four times a day (QID) | ORAL | Status: DC | PRN

## 2023-04-21 MED ORDER — SODIUM CHLORIDE 0.9 % IV SOLN
2.0000 g | Freq: Two times a day (BID) | INTRAVENOUS | Status: DC
  Administered 2023-04-21 – 2023-04-23 (×4): 2 g via INTRAVENOUS
  Filled 2023-04-21 (×4): qty 12.5

## 2023-04-21 MED ORDER — DEXTROSE IN LACTATED RINGERS 5 % IV SOLN
INTRAVENOUS | Status: DC
Start: 2023-04-21 — End: 2023-04-21

## 2023-04-21 MED ORDER — LACTATED RINGERS IV BOLUS: 20.0000 mL/kg | Freq: Once | INTRAVENOUS | Status: DC

## 2023-04-21 MED ORDER — DEXTROSE 50 % IV SOLN: 0.0000 mL | INTRAVENOUS | Status: DC | PRN

## 2023-04-21 MED ORDER — IOHEXOL 300 MG/ML  SOLN
100.0000 mL | Freq: Once | INTRAMUSCULAR | Status: AC | PRN
Start: 2023-04-21 — End: 2023-04-21
  Administered 2023-04-21: 100 mL via INTRAVENOUS

## 2023-04-21 MED ORDER — METRONIDAZOLE 500 MG/100ML IV SOLN
500.0000 mg | Freq: Once | INTRAVENOUS | Status: AC
  Administered 2023-04-21: 500 mg via INTRAVENOUS
  Filled 2023-04-21: qty 100

## 2023-04-21 MED ORDER — INSULIN REGULAR(HUMAN) IN NACL 100-0.9 UT/100ML-% IV SOLN
INTRAVENOUS | Status: DC
  Administered 2023-04-21: 2.8 [IU]/h via INTRAVENOUS
  Administered 2023-04-21: 12 [IU]/h via INTRAVENOUS
  Filled 2023-04-21 (×2): qty 100

## 2023-04-21 MED ORDER — INSULIN ASPART 100 UNIT/ML IJ SOLN
0.0000 [IU] | Freq: Three times a day (TID) | INTRAMUSCULAR | Status: DC
  Administered 2023-04-22: 3 [IU] via SUBCUTANEOUS
  Administered 2023-04-22: 5 [IU] via SUBCUTANEOUS
  Administered 2023-04-22: 3 [IU] via SUBCUTANEOUS
  Administered 2023-04-23: 5 [IU] via SUBCUTANEOUS
  Administered 2023-04-23 (×2): 3 [IU] via SUBCUTANEOUS
  Administered 2023-04-24: 5 [IU] via SUBCUTANEOUS
  Administered 2023-04-24: 3 [IU] via SUBCUTANEOUS

## 2023-04-21 MED ORDER — VANCOMYCIN HCL IN DEXTROSE 1-5 GM/200ML-% IV SOLN
1000.0000 mg | INTRAVENOUS | Status: AC
  Administered 2023-04-22 – 2023-04-23 (×2): 1000 mg via INTRAVENOUS
  Filled 2023-04-21 (×2): qty 200

## 2023-04-21 NOTE — ED Notes (Addendum)
 Pt had a small bowel movement, pt cleaned, sacral pad applied. Pt has a pressure wound to his sacrum and skin breakdown to his bottom area.

## 2023-04-21 NOTE — ED Notes (Signed)
 Per Endo tool, units remained at 12 units

## 2023-04-21 NOTE — Assessment & Plan Note (Addendum)
 Acute on chronic renal failure in the setting of dehydration and hyperglycemia.  Continue to monitor repeat b met Order urine electrolytes

## 2023-04-21 NOTE — Assessment & Plan Note (Signed)
 Chronic stable continue home medications ?

## 2023-04-21 NOTE — Assessment & Plan Note (Signed)
 Chronic stable would   restart aspirin   and Lopressor 25 mg p.o. twice daily

## 2023-04-21 NOTE — Assessment & Plan Note (Signed)
 Order nutritional consult check prealbumin albumin Evaluate for any evidence of dysphagia

## 2023-04-21 NOTE — Assessment & Plan Note (Signed)
 Restart metoprolol 25 mg p.o. twice daily as blood pressure not being going up

## 2023-04-21 NOTE — H&P (Signed)
 [name]:[id number] DOB: 06-16-1932 DOA: 04/21/2023     PCP: [name], MD   Outpatient Specialists:   CARDS:   Dr. Nanetta Batty, MD   Patient arrived to ER on 04/21/23 at 1030 Referred by Attending Pricilla Loveless, MD   Patient coming from:    From facility [facility]     Chief Complaint:   Chief Complaint  Patient presents with   Code Sepsis    HPI: [name] is a 89 y.o. male with medical history significant of sacral wound, DM2, Parkinson, HTN, Dysphagia, HLD, acetabular frx, polyneuropathy, GERD, chronic myeloproliferative disease  CKD 3a  Presented with   confusion CBG up to 582 Came in with fatigue no fever at home  O2 sats down to 93%     Lab Results  Component Value Date   SARSCOV2NAA NEGATIVE 04/21/2023       Regarding pertinent Chronic problems:     Hyperlipidemia - not on statins  Lipid Panel     Component Value Date/Time   CHOL 100 06/18/2012 0506   TRIG 151 (H) 06/18/2012 0506   HDL 30 (L) 06/18/2012 0506   CHOLHDL 3.3 06/18/2012 0506   VLDL 30 06/18/2012 0506   LDLCALC 40 06/18/2012 0506   Parkinson- on Sinemet   HTN on Metoprol   chronic CHF diastolic  - on Lasix  last echo  Recent Results (from the past 81191 hours)  ECHOCARDIOGRAM COMPLETE   Collection Time: 06/14/21 12:47 PM  Result Value   Area-P 1/2 1.87   Narrative      ECHOCARDIOGRAM REPORT       IMPRESSIONS    1. Left ventricular ejection fraction, by estimation, is 60 to 65%. The left ventricle has normal function. The left ventricle has no regional wall motion abnormalities. Left ventricular diastolic parameters are consistent with Grade I diastolic  dysfunction (impaired relaxation).  2. Right ventricular systolic function is normal. The right ventricular size is normal.  3. The mitral valve is normal in structure. No evidence of mitral valve regurgitation. No evidence of mitral stenosis.  4. The aortic valve is tricuspid. There is mild  calcification of the aortic valve. Aortic valve regurgitation is not visualized. Aortic valve sclerosis is present, with no evidence of aortic valve stenosis.  5. Aortic dilatation noted. There is moderate dilatation of the ascending aorta, measuring 46 mm.  6. The inferior vena cava is normal in size with greater than 50% respiratory variability, suggesting right atrial pressure of 3 mmHg.           CAD  - On Aspirin,  betablocker,                 - followed by cardiology                 Myeloproliferative - on hydroxyurea    DM 2 -  Lab Results  Component Value Date   HGBA1C 6.9 (H) 12/12/2022   on   PO meds only,       CKD stage IIIa baseline Cr 1.2 CrCl cannot be calculated (Unknown ideal weight.).  Lab Results  Component Value Date   CREATININE 1.42 (H) 04/21/2023   CREATININE 1.25 (H) 12/15/2022   CREATININE 1.21 12/14/2022   Lab Results  Component Value Date   NA 137 04/21/2023   CL 100 04/21/2023   K 4.3 04/21/2023   CO2 20 (L) 04/21/2023   BUN 56 (H) 04/21/2023   CREATININE 1.42 (H) 04/21/2023  GFRNONAA 48 (L) 04/21/2023   CALCIUM 9.8 04/21/2023   ALBUMIN 3.2 (L) 04/21/2023   GLUCOSE 604 (HH) 04/21/2023    While in ER: Clinical Course as of 04/21/23 1850  Mon Apr 21, 2023  1137 Glucose-Capillary(!!): 576 [JL]  1442 Lactic Acid, Venous(!!): 3.2 [JL]    Clinical Course User Index [JL] Rosealee Concha, MD       Lab Orders         Blood Culture (routine x 2)         Resp panel by RT-PCR (RSV, Flu A&B, Covid) Anterior Nasal Swab         Beta-hydroxybutyric acid         Comprehensive metabolic panel         CBC with Differential         Protime-INR         Urinalysis, w/ Reflex to Culture (Infection Suspected) -Urine, Clean Catch         Blood gas, venous (at [facility] and [facility])         Osmolality         Lactic acid, plasma         POC CBG, ED         I-Stat venous blood gas, ([facility], [facility], [facility])         I-Stat Lactic Acid, ED         CBG monitoring, ED          CBG monitoring, ED         CBG monitoring, ED         CBG monitoring, ED         CBG monitoring, ED         CBG monitoring, ED         CBG monitoring, ED      CT HEAD  Mild chronic small vessel ischemic disease and moderate to severe cerebral atrophy.    CXR - Subtle airspace disease left base, possible pneumonia.     CT chest abd pelvis -   4.5 cm ascending thoracic aortic aneurysm  Small left pleural effusion. Bibasilar dependent opacities, likely atelectasis. Moderate stool burden throughout the colon.   Age-indeterminate moderate L2 compression fracture.  Following Medications were ordered in ER: Medications  lactated ringers infusion ( Intravenous New Bag/Given 04/21/23 1140)  dextrose 5 % in lactated ringers infusion ( Intravenous New Bag/Given 04/21/23 1506)  dextrose 50 % solution 0-50 mL (has no administration in time range)  insulin regular, human (MYXREDLIN) 100 units/ 100 mL infusion (13 Units/hr Intravenous Rate/Dose Change 04/21/23 1824)  lactated ringers bolus 1,000 mL (0 mLs Intravenous Stopped 04/21/23 1349)  ceFEPIme (MAXIPIME) 2 g in sodium chloride 0.9 % 100 mL IVPB (0 g Intravenous Stopped 04/21/23 1209)  metroNIDAZOLE (FLAGYL) IVPB 500 mg (0 mg Intravenous Stopped 04/21/23 1333)  vancomycin (VANCOCIN) IVPB 1000 mg/200 mL premix (0 mg Intravenous Stopped 04/21/23 1458)  potassium chloride 10 mEq in 100 mL IVPB (0 mEq Intravenous Stopped 04/21/23 1459)  iohexol (OMNIPAQUE) 300 MG/ML solution 100 mL (100 mLs Intravenous Contrast Given 04/21/23 1517)        ED Triage Vitals  Encounter Vitals Group     BP 04/21/23 1054 108/84     Systolic BP Percentile --      Diastolic BP Percentile --      Pulse Rate 04/21/23 1054 (!) 121     Resp 04/21/23 1054 20     Temp 04/21/23 1054 (!) 100.6  F (38.1 C)     Temp Source 04/21/23 1054 Rectal     SpO2 04/21/23 1054 95 %     Weight --      Height --      Head Circumference --      Peak Flow --      Pain Score 04/21/23  1043 0     Pain Loc --      Pain Education --      Exclude from Growth Chart --   WGNF(62)@     _________________________________________ Significant initial  Findings: Abnormal Labs Reviewed  BETA-HYDROXYBUTYRIC ACID - Abnormal; Notable for the following components:      Result Value   Beta-Hydroxybutyric Acid 3.59 (*)    All other components within normal limits  COMPREHENSIVE METABOLIC PANEL WITH GFR - Abnormal; Notable for the following components:   CO2 20 (*)    Glucose, Bld 604 (*)    BUN 56 (*)    Creatinine, Ser 1.42 (*)    Total Protein 8.9 (*)    Albumin 3.2 (*)    AST 14 (*)    Total Bilirubin 1.3 (*)    GFR, Estimated 48 (*)    Anion gap 17 (*)    All other components within normal limits  CBC WITH DIFFERENTIAL/PLATELET - Abnormal; Notable for the following components:   WBC 17.2 (*)    HCT 52.1 (*)    MCV 103.8 (*)    RDW 15.9 (*)    Platelets 690 (*)    Neutro Abs 15.1 (*)    Monocytes Absolute 1.1 (*)    Abs Immature Granulocytes 0.20 (*)    All other components within normal limits  PROTIME-INR - Abnormal; Notable for the following components:   Prothrombin Time 15.9 (*)    INR 1.3 (*)    All other components within normal limits  BLOOD GAS, VENOUS - Abnormal; Notable for the following components:   pCO2, Ven 35 (*)    pO2, Ven 53 (*)    Acid-base deficit 2.4 (*)    All other components within normal limits  CBG MONITORING, ED - Abnormal; Notable for the following components:   Glucose-Capillary 576 (*)    All other components within normal limits  I-STAT CG4 LACTIC ACID, ED - Abnormal; Notable for the following components:   Lactic Acid, Venous 2.1 (*)    All other components within normal limits  I-STAT CG4 LACTIC ACID, ED - Abnormal; Notable for the following components:   Lactic Acid, Venous 3.2 (*)    All other components within normal limits  CBG MONITORING, ED - Abnormal; Notable for the following components:   Glucose-Capillary 513 (*)     All other components within normal limits  CBG MONITORING, ED - Abnormal; Notable for the following components:   Glucose-Capillary 457 (*)    All other components within normal limits  CBG MONITORING, ED - Abnormal; Notable for the following components:   Glucose-Capillary 239 (*)    All other components within normal limits  CBG MONITORING, ED - Abnormal; Notable for the following components:   Glucose-Capillary 417 (*)    All other components within normal limits  CBG MONITORING, ED - Abnormal; Notable for the following components:   Glucose-Capillary 416 (*)    All other components within normal limits  CBG MONITORING, ED - Abnormal; Notable for the following components:   Glucose-Capillary 338 (*)    All other components within normal limits  CBG MONITORING, ED -  Abnormal; Notable for the following components:   Glucose-Capillary 355 (*)    All other components within normal limits    ECG: Ordered Personally reviewed and interpreted by me showing: HR : 115 Rhythm: Atrial fibrillation Paired ventricular premature complexes QTC 436    ____________________ This patient meets SIRS Criteria and may be septic   The recent clinical data is shown below. Vitals:   04/21/23 1400 04/21/23 1453 04/21/23 1500 04/21/23 1630  BP: 111/70  135/75 120/81  Pulse: (!) 106  (!) 101 (!) 52  Resp: (!) 22  (!) 25 (!) 21  Temp:  97.6 F (36.4 C)    TempSrc:  Oral    SpO2: 94%  94% 95%     WBC     Component Value Date/Time   WBC 17.2 (H) 04/21/2023 1126   LYMPHSABS 0.8 04/21/2023 1126   LYMPHSABS 1.6 01/01/2022 1255   LYMPHSABS 1.5 10/29/2016 1018   MONOABS 1.1 (H) 04/21/2023 1126   MONOABS 1.2 (H) 10/29/2016 1018   EOSABS 0.0 04/21/2023 1126   EOSABS 0.2 01/01/2022 1255   BASOSABS 0.1 04/21/2023 1126   BASOSABS 0.3 (H) 01/01/2022 1255   BASOSABS 0.2 (H) 10/29/2016 1018    Lactic Acid, Venous    Component Value Date/Time   LATICACIDVEN 3.2 (HH) 04/21/2023 1356    Lactic  Acid, Venous    Component Value Date/Time   LATICACIDVEN 3.4 (HH) 04/21/2023 1840    Procalcitonin   Ordered      UA  ordered        ABX started Antibiotics Given (last 72 hours)     Date/Time Action Medication Dose Rate   04/21/23 1139 New Bag/Given   ceFEPIme (MAXIPIME) 2 g in sodium chloride 0.9 % 100 mL IVPB 2 g 200 mL/hr   04/21/23 1233 New Bag/Given   metroNIDAZOLE (FLAGYL) IVPB 500 mg 500 mg 100 mL/hr   04/21/23 1358 New Bag/Given   vancomycin (VANCOCIN) IVPB 1000 mg/200 mL premix 1,000 mg 200 mL/hr      VBG pending   __________________________________________________________ Recent Labs  Lab 04/21/23 1126  NA 137  K 4.3  CO2 20*  GLUCOSE 604*  BUN 56*  CREATININE 1.42*  CALCIUM 9.8    Cr    Up from baseline see below Lab Results  Component Value Date   CREATININE 1.42 (H) 04/21/2023   CREATININE 1.25 (H) 12/15/2022   CREATININE 1.21 12/14/2022    Recent Labs  Lab 04/21/23 1126  AST 14*  ALT 9  ALKPHOS 75  BILITOT 1.3*  PROT 8.9*  ALBUMIN 3.2*   Lab Results  Component Value Date   CALCIUM 9.8 04/21/2023    Plt: Lab Results  Component Value Date   PLT 690 (H) 04/21/2023      Recent Labs  Lab 04/21/23 1126  WBC 17.2*  NEUTROABS 15.1*  HGB 16.9  HCT 52.1*  MCV 103.8*  PLT 690*    HG/HCT stable     Component Value Date/Time   HGB 16.9 04/21/2023 1126   HGB 14.8 01/01/2022 1255   HGB 14.4 10/29/2016 1018   HCT 52.1 (H) 04/21/2023 1126   HCT 48.2 01/01/2022 1255   HCT 45.9 10/29/2016 1018   MCV 103.8 (H) 04/21/2023 1126   MCV 84 01/01/2022 1255   MCV 90.0 10/29/2016 1018     ______________________________ Hospitalist was called for admission for SIRS, hyperglycemia   The following Work up has been ordered so far:  Orders Placed This Encounter  Procedures  Blood Culture (routine x 2)   Resp panel by RT-PCR (RSV, Flu A&B, Covid) Anterior Nasal Swab   DG Chest Port 1 View   CT CHEST ABDOMEN PELVIS W CONTRAST   CT  HEAD WO CONTRAST ( )   Beta-hydroxybutyric acid   Comprehensive metabolic panel   CBC with Differential   Protime-INR   Urinalysis, w/ Reflex to Culture (Infection Suspected) -Urine, Clean Catch   Blood gas, venous (at [facility] and [facility])   Osmolality   Lactic acid, plasma   Diet NPO time specified   Document height and weight   Assess and Document Glasgow Coma Scale   Document vital signs within 1-hour of fluid bolus completion.  Notify provider of abnormal vital signs despite fluid resuscitation.   Refer to Sidebar Report: Sepsis Bundle ED/IP   Notify provider for difficulties obtaining IV access   Initiate Carrier Fluid Protocol   Upon IV fluid bolus completion, place order for STAT BMET (LAB15) and call provider with results.   IV insulin infusion with sufficient glucose should be continued until MD determines acidosis is corrected and places transition orders.   Code Sepsis activation.  This occurs automatically when order is signed and prioritizes pharmacy, lab, and radiology services for STAT collections and interventions.  If [facility] downtime, call [facility] [phone]) to activate Code Sepsis.   monitoring by pharmacy   Consult to hospitalist   POC CBG, ED   I-Stat venous blood gas, ([facility], [facility], [facility])   I-Stat Lactic Acid, ED   CBG monitoring, ED   CBG monitoring, ED   CBG monitoring, ED   CBG monitoring, ED   CBG monitoring, ED   CBG monitoring, ED   CBG monitoring, ED   ED EKG   EKG 12-Lead   Insert peripheral IV X 1     OTHER Significant initial  Findings:  labs showing:     DM  labs:  HbA1C: Recent Labs    12/12/22 0504  HGBA1C 6.9*    CBG (last 3)  Recent Labs    04/21/23 1644 04/21/23 1724 04/21/23 1821  GLUCAP 416* 338* 355*        Cultures:    Component Value Date/Time   SDES  04/21/2023 1120    BLOOD LEFT FOREARM Performed at [facility], [address], [place], [place] [address]    SDES  04/21/2023 1120    BLOOD RIGHT  FOREARM Performed at [facility], [address] [place], [place] [address]    SPECREQUEST  04/21/2023 1120    BOTTLES DRAWN AEROBIC AND ANAEROBIC Blood Culture adequate volume Performed at [facility], [address] [place], [place] [address]    SPECREQUEST  04/21/2023 1120    BOTTLES DRAWN AEROBIC AND ANAEROBIC Blood Culture adequate volume Performed at [facility], [address] [place], [place] [address]    CULT PENDING 04/21/2023 1120   CULT PENDING 04/21/2023 1120   REPTSTATUS PENDING 04/21/2023 1120   REPTSTATUS PENDING 04/21/2023 1120     Radiological Exams on Admission: CT CHEST ABDOMEN PELVIS W CONTRAST Result Date: 04/21/2023 CLINICAL DATA:  Sepsis EXAM: CT CHEST, ABDOMEN, AND PELVIS WITH CONTRAST TECHNIQUE: Multidetector CT imaging of the chest, abdomen and pelvis was performed following the standard protocol during bolus administration of intravenous contrast. RADIATION DOSE REDUCTION: This exam was performed according to the departmental dose-optimization program which includes automated exposure control, adjustment of the mA and/or kV according to patient size and/or use of iterative  reconstruction technique. CONTRAST:  100mL OMNIPAQUE IOHEXOL 300 MG/ML  SOLN COMPARISON:  None Available. FINDINGS: CT CHEST FINDINGS Cardiovascular: Heart is normal size. Densely calcified coronary arteries. Aneurysmal dilatation of the ascending thoracic aorta measuring 4.5 cm. Calcified and noncalcified irregular plaque throughout the descending thoracic aorta. No dissection. No filling defects in the pulmonary arteries to suggest pulmonary emboli. Mediastinum/Nodes: No mediastinal, hilar, or axillary adenopathy. Trachea and esophagus are unremarkable. Thyroid unremarkable. Lungs/Pleura: Small left pleural effusion. Dependent atelectasis in the lower lobes bilaterally. Musculoskeletal: Chest wall soft tissues are unremarkable. No acute bony  abnormality CT ABDOMEN PELVIS FINDINGS Hepatobiliary: Small layering gallstones within the gallbladder. Diffuse low-density throughout the liver compatible with fatty infiltration. No focal hepatic abnormality. Pancreas: No focal abnormality or ductal dilatation. Spleen: Splenomegaly with a craniocaudal length of 17 cm. No focal abnormality. Adrenals/Urinary Tract: No adrenal abnormality. No focal renal abnormality. No stones or hydronephrosis. Urinary bladder is unremarkable. Stomach/Bowel: Large stool burden throughout the colon. Moderate hiatal hernia. No bowel obstruction or inflammatory process. Few scattered sigmoid diverticula. Vascular/Lymphatic: Aortic atherosclerosis. No evidence of aneurysm or adenopathy. Reproductive: No visible focal abnormality. Other: No free fluid or free air. Musculoskeletal: Moderate L2 compression fracture, age indeterminate. Normal alignment. Degenerative disc and facet disease throughout the lumbar spine. IMPRESSION: 4.5 cm ascending thoracic aortic aneurysm. Recommend semi-annual imaging followup by CTA or MRA and referral to cardiothoracic surgery if not already obtained. This recommendation follows 2010 ACCF/AHA/AATS/ACR/ASA/SCA/SCAI/SIR/STS/SVM Guidelines for the Diagnosis and Management of Patients With Thoracic Aortic Disease. Circulation. 2010; 121: O962-X528. Aortic aneurysm NOS (ICD10-I71.9) Coronary artery disease, aortic atherosclerosis. Small left pleural effusion. Bibasilar dependent opacities, likely atelectasis. Moderate hiatal hernia. Moderate stool burden throughout the colon. Splenomegaly. Age-indeterminate moderate L2 compression fracture. Electronically Signed   By: Janeece Mechanic M.D.   On: 04/21/2023 18:25   CT HEAD WO CONTRAST ( ) Result Date: 04/21/2023 CLINICAL DATA:  Mental status change, unknown cause.  Sepsis. EXAM: CT HEAD WITHOUT CONTRAST TECHNIQUE: Contiguous axial images were obtained from the base of the skull through the vertex without  intravenous contrast. RADIATION DOSE REDUCTION: This exam was performed according to the departmental dose-optimization program which includes automated exposure control, adjustment of the mA and/or kV according to patient size and/or use of iterative reconstruction technique. COMPARISON:  Head MRI 10/17/2021 FINDINGS: Brain: There is no evidence of an acute infarct, intracranial hemorrhage, mass, midline shift, or extra-axial fluid collection. Periventricular white matter hypodensities are nonspecific but compatible with mild chronic small vessel ischemic disease. Moderate to severe cerebral atrophy is again noted including prominent perisylvian volume loss. Vascular: No hyperdense vessel or unexpected calcification. Skull: No acute fracture or suspicious lesion. Sinuses/Orbits: Visualized paranasal sinuses and mastoid air cells are clear. Bilateral cataract extraction. Other: None. IMPRESSION: 1. No evidence of acute intracranial abnormality. 2. Mild chronic small vessel ischemic disease and moderate to severe cerebral atrophy. Electronically Signed   By: Aundra Lee M.D.   On: 04/21/2023 18:05   DG Chest Port 1 View Result Date: 04/21/2023 CLINICAL DATA:  Questionable sepsis. EXAM: PORTABLE CHEST 1 VIEW COMPARISON:  12/11/2022 FINDINGS: Low volume film. Mild asymmetric elevation right hemidiaphragm. Subtle airspace disease noted left base, possible pneumonia. No pneumothorax or pleural effusion. No acute bony abnormality. IMPRESSION: Subtle airspace disease left base, possible pneumonia. Electronically Signed   By: Donnal Fusi M.D.   On: 04/21/2023 11:10   _______________________________________________________________________________________________________ Latest  Blood pressure 120/81, pulse (!) 52, temperature 97.6 F (36.4 C), temperature source Oral, resp. rate (!) 21, SpO2 95%.  Vitals  labs and radiology finding personally reviewed  Review of Systems:    Pertinent positives include:  ,  fatigue,   Constitutional:  No weight loss, night sweats, Fevers, chillsweight loss  HEENT:  No headaches, Difficulty swallowing,Tooth/dental problems,Sore throat,  No sneezing, itching, ear ache, nasal congestion, post nasal drip,  Cardio-vascular:  No chest pain, Orthopnea, PND, anasarca, dizziness, palpitations.no Bilateral lower extremity swelling  GI:  No heartburn, indigestion, abdominal pain, nausea, vomiting, diarrhea, change in bowel habits, loss of appetite, melena, blood in stool, hematemesis Resp:  no shortness of breath at rest. No dyspnea on exertion, No excess mucus, no productive cough, No non-productive cough, No coughing up of blood.No change in color of mucus.No wheezing. Skin:  no rash or lesions. No jaundice GU:  no dysuria, change in color of urine, no urgency or frequency. No straining to urinate.  No flank pain.  Musculoskeletal:  No joint pain or no joint swelling. No decreased range of motion. No back pain.  Psych:  No change in mood or affect. No depression or anxiety. No memory loss.  Neuro: no localizing neurological complaints, no tingling, no weakness, no double vision, no gait abnormality, no slurred speech, no confusion  All systems reviewed and apart from HOPI all are negative _______________________________________________________________________________________________ Past Medical History:   Past Medical History:  Diagnosis Date   Chest pain    Coronary artery disease    Diabetes mellitus without complication (HCC)    Hyperlipidemia    Hypertension       Past Surgical History:  Procedure Laterality Date   CARDIAC CATHETERIZATION  11/01/2005   patent stents with normal LV function   CARDIAC CATHETERIZATION  11/08/2003   cypher stent mid dominant right coronary lesion   CARDIAC SURGERY     CORONARY ANGIOPLASTY     post LAD and RCA stenting   DOPPLER ECHOCARDIOGRAPHY  06/18/2010   EF =>55%,LV normal   EYE SURGERY     holter monitor   11/08/2005   NM MYOCAR PERF WALL MOTION  05/23/2008   EF 62% ,norm myocardial perfusion    Social History:     reports that he has quit smoking. His smoking use included cigarettes. He quit smokeless tobacco use about 40 years ago. He reports current alcohol use. He reports that he does not use drugs.   Family History:   History reviewed. No pertinent family history. ______________________________________________________________________________________________ Allergies: No Known Allergies   Prior to Admission medications   Medication Sig Start Date End Date Taking? Authorizing Provider  acetaminophen (TYLENOL) 650 MG CR tablet Take 650 mg by mouth every 8 (eight) hours as needed for pain.    [provider]  ammonium lactate (LAC-HYDRIN) 12 % lotion 1 Application at bedtime. Apply to bilateral legs and feet; do not apply to right heel wound 08/05/22   [provider]  ASPIRIN 81 PO Take 81 mg by mouth daily.    [provider]  carbidopa-levodopa (SINEMET IR) 25-100 MG tablet Take 2 tablets by mouth 3 (three) times daily. At 8am, noon, 5pm Patient taking differently: Take 1 tablet by mouth at bedtime. 01/01/22   Phebe Brasil, MD  furosemide (LASIX) 40 MG tablet Take 1 tablet (40 mg total) by mouth as needed for edema. 12/16/22 03/16/23  Rai, Hurman Maiden, MD  glimepiride (AMARYL) 4 MG tablet Take 4 mg by mouth daily with breakfast. 04/22/12   [provider]  hydroxyurea (HYDREA) 500 MG capsule TAKE 1 CAPSULE BY MOUTH ONCE DAILY  WITH FOOD 03/29/21   Si Gaul, MD  lactulose (CHRONULAC) 10 GM/15ML solution Take 30 mLs (20 g total) by mouth 2 (two) times daily as needed for moderate constipation or severe constipation. 12/16/22   Rai, Delene Ruffini, MD  Menthol, Topical Analgesic, (BIOFREEZE ROLL-ON) 4 % GEL Apply 1 Application topically every 4 (four) hours as needed. For mild/moderate pain    [provider]  methocarbamol (ROBAXIN) 500 MG tablet  Take 1 tablet (500 mg total) by mouth every 6 (six) hours as needed for muscle spasms. 12/16/22   Rai, Delene Ruffini, MD  metoprolol tartrate (LOPRESSOR) 25 MG tablet Take 1 tablet (25 mg total) by mouth 2 (two) times daily. 12/16/22   Rai, Delene Ruffini, MD  Multiple Vitamin (MULTIVITAMIN WITH MINERALS) TABS tablet Take 1 tablet by mouth every morning.    [provider]  oxyCODONE (OXY IR/ROXICODONE) 5 MG immediate release tablet Take 1 tablet (5 mg total) by mouth every 6 (six) hours as needed for moderate pain (pain score 4-6) or severe pain (pain score 7-10). 12/16/22   Rai, Ripudeep Kirtland Bouchard, MD  pantoprazole (PROTONIX) 40 MG tablet Take 1 tablet (40 mg total) by mouth daily at 6 (six) AM. 12/17/22   Rai, Ripudeep K, MD  polyethylene glycol (MIRALAX / GLYCOLAX) 17 g packet Take 17 g by mouth daily.    [provider]  PROSOURCE PROTEIN PO Take 1 Bottle by mouth daily. Med Plus 2.0    [provider]  senna (SENOKOT) 8.6 MG TABS tablet Take 1 tablet (8.6 mg total) by mouth 2 (two) times daily. 12/16/22   Rai, Delene Ruffini, MD  zinc oxide 20 % ointment Apply 1 Application topically 3 (three) times daily as needed for irritation. Apply to sacrum/coccyx every shift    [provider]    ___________________________________________________________________________________________________ Physical Exam:    04/21/2023    4:30 PM 04/21/2023    3:00 PM 04/21/2023    2:00 PM  Vitals with BMI  Systolic 120 135 119  Diastolic 81 75 70  Pulse 52 101 106     1. General:  in No  Acute distress    Chronically ill  -appearing 2. Psychological: Alert and   Oriented 3. Head/ENT: Dry Mucous Membranes                          Head Non traumatic, neck supple                   Poor  Dentition 4. SKIN: decreased Skin turgor,  Skin clean Dry sarcal decubitis ulcer    5. Heart: Regular rate and rhythm no  Murmur, no Rub or gallop 6. Lungs:  no wheezes or crackles   7. Abdomen: Soft,   non-tended distended bowel sounds present 8. Lower extremities: no clubbing, cyanosis, no  edema 9. Neurologically Grossly intact, moving all 4 extremities equally  10. MSK: Normal range of motion    Chart has been reviewed  ______________________________________________________________________________________________  Assessment/Plan 89 y.o. male with medical history significant of sacral wound, DM2, Parkinson, HTN, Dysphagia, HLD, acetabular frx, polyneuropathy, GERD, chronic myeloproliferative disease  CKD 3a  Admitted for SIRS hyperglycemia AKI   Present on Admission:  SIRS (systemic inflammatory response syndrome) (HCC)  Acute renal failure (HCC)  Coronary atherosclerosis  Diabetes mellitus type 2 with complications (HCC)  Essential hypertension  Myeloproliferative disorder (HCC)  Parkinsonism (HCC)  Protein-calorie malnutrition, severe (HCC)  Decubitus ulcer  CKD (chronic kidney disease), stage III (HCC)     Acute renal failure (HCC) Acute on chronic renal failure in the setting of dehydration and hyperglycemia.  Continue to monitor repeat b met Order urine electrolytes  Coronary atherosclerosis Chronic stable would   restart aspirin   and Lopressor 25 mg p.o. twice daily  Diabetes mellitus type 2 with complications (HCC) Severe hyperglycemia Patient started on insulin drip.  Once blood sugar under control may need to transition to long-lasting insulin and sliding scale Severe hypoglycemia could be in the setting of underlying infection will need to look for infection source  Essential hypertension Restart metoprolol 25 mg p.o. twice daily as blood pressure not being going up  HYPERLIPIDEMIA Not on statins  Myeloproliferative disorder (HCC) If chronic leukocytosis.  Continue to monitor hold off on hydroxyurea given acute illness  Parkinsonism (HCC) Chronic stable continue home medications  Protein-calorie malnutrition, severe (HCC) Order nutritional  consult check prealbumin albumin Evaluate for any evidence of dysphagia  SIRS (systemic inflammatory response syndrome) (HCC)  -SIRS criteria met with  elevated white blood cell count,       Component Value Date/Time   WBC 17.2 (H) 04/21/2023 1126   LYMPHSABS 0.8 04/21/2023 1126   LYMPHSABS 1.6 01/01/2022 1255   LYMPHSABS 1.5 10/29/2016 1018     tachycardia   ,   fever   RR >20 Today's Vitals   04/21/23 1900 04/21/23 1915 04/21/23 1930 04/21/23 1945  BP: (!) 147/78 126/81 (!) 107/92 120/80  Pulse: 98  98 98  Resp: (!) 23 20 (!) 21 20  Temp:    97.8 F (36.6 C)  TempSrc:    Oral  SpO2: 95%  95% 95%  PainSc:          -Most likely source being: Source of sepsis is unknown but given clinical picture will continue to treat   Patient meeting criteria for Severe sepsis with    evidence of end organ damage/organ dysfunction such as   elevated lactic acid >2     Component Value Date/Time   LATICACIDVEN 3.4 (HH) 04/21/2023 1840    acute metabolic encephalopathy  SBP<90 mmhg or MAP < 65 mmhg     - Obtain serial lactic acid and procalcitonin level.  - Initiated IV antibiotics in ER: Antibiotics Given (last 72 hours)     Date/Time Action Medication Dose Rate   04/21/23 1139 New Bag/Given   ceFEPIme (MAXIPIME) 2 g in sodium chloride 0.9 % 100 mL IVPB 2 g 200 mL/hr   04/21/23 1233 New Bag/Given   metroNIDAZOLE (FLAGYL) IVPB 500 mg 500 mg 100 mL/hr   04/21/23 1358 New Bag/Given   vancomycin (VANCOCIN) IVPB 1000 mg/200 mL premix 1,000 mg 200 mL/hr       Will continue  on : Cefepime Vanco and Flagyl  check MRSA  - await results of blood and urine culture  - Rehydrate aggressively  Intravenous fluids were administered    7:57 PM   Decubitus ulcer Noted to be present at admission  Order would care consult  CKD (chronic kidney disease), stage III (HCC)  -chronic avoid nephrotoxic medications such as NSAIDs, Vanco Zosyn combo,  avoid hypotension, continue to follow renal  function   Other plan as per orders.  DVT prophylaxis:  SCD     Code Status:  DNR/DNI e as per patient paperwork at bedside  ACP   none   Family Communication:   Family not at  Bedside    Diet  Diet  Orders (From admission, onward)     Start     Ordered   04/21/23 1036  Diet NPO time specified  (Undifferentiated presentation (screening labs and basic nursing orders))  Diet effective now        04/21/23 1035            Disposition Plan:     Back to current facility when stable                             Following barriers for discharge:                                                       Electrolytes corrected                            white count improving able to transition to PO antibiotics                                   Consult Orders  (From admission, onward)           Start     Ordered   04/21/23 1833  Consult to hospitalist  Once       Provider:  (Not yet assigned)  Question Answer Comment  Place call to: [facility]   Reason for Consult Admit      04/21/23 1832                               Would benefit from PT/OT eval prior to DC  Ordered                   Swallow eval - SLP ordered                   Diabetes care coordinator                   Transition of care consulted                   Nutrition    consulted                  Wound care  consulted                   Palliative care    consulted                                      Consults called: none     Admission status:  ED Disposition     ED Disposition  Admit   Condition  --   Comment  Hospital Area: [facility] [[id number]]  Level of Care: Stepdown [14]  Admit to SDU based on following criteria: Hemodynamic compromise or significant risk of instability:  Patient requiring short term acute titration and management of vasoactive drips, and invasive monitoring (i.e., CVP and Arterial line).  May place patient in observation at [facility] or  [facility] if equivalent level of care is available::  No  Covid Evaluation: Asymptomatic - no recent exposure (last 10 days) testing not required  Diagnosis: SIRS (systemic inflammatory response syndrome) [facility]) [[id number]]  Admitting Physician: Shenicka Sunderlin [[id number]]  Attending Physician: Dashonna Chagnon [[id number]]           Obs      Level of care    stepdown       Lab Results  Component Value Date   SARSCOV2NAA NEGATIVE 04/21/2023     Jianni Batten 04/21/2023, 8:11 PM    [facility]     after 2 AM please page floor coverage PA If 7AM-7PM, please contact the day team taking care of the patient using [email or web address]

## 2023-04-21 NOTE — Subjective & Objective (Signed)
 Came in with fatigue no fever at home

## 2023-04-21 NOTE — ED Notes (Signed)
 ED TO INPATIENT HANDOFF REPORT  ED Nurse Name and Phone #:  Leatrice Jewels, RN [phone]  S Name/Age/Gender [name] 89 y.o. male Room/Bed: WA07/WA07  Code Status   Code Status: Limited: Do not attempt resuscitation (DNR) -DNR-LIMITED -Do Not Intubate/DNI   Home/SNF/Other Skilled nursing facility Patient oriented to: self and place Is this baseline?  No  Triage Complete: Triage complete  Chief Complaint SIRS (systemic inflammatory response syndrome) (HCC) [R65.10]  Triage Note BIB EMS from [facility] after 3 days of AMS, CBG 582 -110-130 -26-93% RA 98 on 3 L 132/70- #20 L arm LR. Normally ambulates with walker, feeds self, normal conversation.    Allergies No Known Allergies  Level of Care/Admitting Diagnosis ED Disposition     ED Disposition  Admit   Condition  --   Comment  Hospital Area: [facility] [[id number]]  Level of Care: Stepdown [14]  Admit to SDU based on following criteria: Hemodynamic compromise or significant risk of instability:  Patient requiring short term acute titration and management of vasoactive drips, and invasive monitoring (i.e., CVP and Arterial line).  May place patient in observation at [facility] or [facility] if equivalent level of care is available:: No  Covid Evaluation: Asymptomatic - no recent exposure (last 10 days) testing not required  Diagnosis: SIRS (systemic inflammatory response syndrome) [facility]) [[id number]]  Admitting Physician: Therisa Doyne [[id number]]  Attending Physician: Therisa Doyne [[id number]]          B Medical/Surgery History Past Medical History:  Diagnosis Date   Chest pain    Coronary artery disease    Diabetes mellitus without complication (HCC)    Hyperlipidemia    Hypertension    Past Surgical History:  Procedure Laterality Date   CARDIAC CATHETERIZATION  11/01/2005   patent stents with normal LV function   CARDIAC CATHETERIZATION  11/08/2003   cypher stent mid dominant  right coronary lesion   CARDIAC SURGERY     CORONARY ANGIOPLASTY     post LAD and RCA stenting   DOPPLER ECHOCARDIOGRAPHY  06/18/2010   EF =>55%,LV normal   EYE SURGERY     holter monitor  11/08/2005   NM MYOCAR PERF WALL MOTION  05/23/2008   EF 62% ,norm myocardial perfusion     A IV Location/Drains/Wounds Patient Lines/Drains/Airways Status     Active Line/Drains/Airways     Name Placement date Placement time Site Days   Peripheral IV 04/21/23 20 G 1" Left;Posterior Forearm 04/21/23  1059  Forearm  less than 1   Peripheral IV 04/21/23 20 G Anterior;Left Forearm 04/21/23  1119  Forearm  less than 1   Peripheral IV 04/21/23 20 G 1" Anterior;Right Forearm 04/21/23  1125  Forearm  less than 1   Pressure Injury 12/12/22 Mid;Right;Left stage 2 sacrum 12/12/22  1135  -- 130            Intake/Output Last 24 hours  Intake/Output Summary (Last 24 hours) at 04/21/2023 2138 Last data filed at 04/21/2023 1738 Gross per 24 hour  Intake --  Output 1 ml  Net -1 ml    Labs/Imaging Results for orders placed or performed during the hospital encounter of 04/21/23 (from the past 48 hours)  I-Stat Lactic Acid, ED     Status: Abnormal   Collection Time: 04/21/23 11:16 AM  Result Value Ref Range   Lactic Acid, Venous 2.1 (HH) 0.5 - 1.9 mmol/L   Comment NOTIFIED PHYSICIAN   POC CBG, ED  Status: Abnormal   Collection Time: 04/21/23 11:19 AM  Result Value Ref Range   Glucose-Capillary 576 (HH) 70 - 99 mg/dL    Comment: Glucose reference range applies only to samples taken after fasting for at least 8 hours.   Comment 1 Notify RN   Beta-hydroxybutyric acid     Status: Abnormal   Collection Time: 04/21/23 11:20 AM  Result Value Ref Range   Beta-Hydroxybutyric Acid 3.59 (H) 0.05 - 0.27 mmol/L    Comment: Performed at [facility], [address], [place], [place] [address]  Blood Culture (routine x 2)     Status: None (Preliminary result)   Collection Time: 04/21/23  11:20 AM   Specimen: BLOOD LEFT FOREARM  Result Value Ref Range   Specimen Description      BLOOD LEFT FOREARM Performed at [facility], [address] [place], [place] [address]    Special Requests      BOTTLES DRAWN AEROBIC AND ANAEROBIC Blood Culture adequate volume Performed at [facility], [address], [place], [place] [address]    Culture PENDING    Report Status PENDING   Blood Culture (routine x 2)     Status: None (Preliminary result)   Collection Time: 04/21/23 11:20 AM   Specimen: BLOOD RIGHT FOREARM  Result Value Ref Range   Specimen Description      BLOOD RIGHT FOREARM Performed at [facility], [address], [place], [place] [address]    Special Requests      BOTTLES DRAWN AEROBIC AND ANAEROBIC Blood Culture adequate volume Performed at [facility], [address], [place], [place] [address]    Culture PENDING    Report Status PENDING   Osmolality     Status: Abnormal   Collection Time: 04/21/23 11:20 AM  Result Value Ref Range   Osmolality 355 (HH) 275 - 295 mOsm/kg    Comment: REPEATED TO VERIFY CRITICAL RESULT CALLED TO, READ BACK BY AND VERIFIED WITH: C Zakaria Fromer RN 04/21/2023 2007 BNUNNERY Performed at [facility], [address], [place], [place] [address]   Resp panel by RT-PCR (RSV, Flu A&B, Covid)     Status: None   Collection Time: 04/21/23 11:25 AM   Specimen: Nasal Swab  Result Value Ref Range   SARS Coronavirus 2 by RT PCR NEGATIVE NEGATIVE    Comment: (NOTE) SARS-CoV-2 target nucleic acids are NOT DETECTED.  The SARS-CoV-2 RNA is generally detectable in upper respiratory specimens during the acute phase of infection. The lowest concentration of SARS-CoV-2 viral copies this assay can detect is 138 copies/mL. A negative result does not preclude SARS-Cov-2 infection and should not be used as the sole basis for treatment or other patient management decisions. A negative result may  occur with  improper specimen collection/handling, submission of specimen other than nasopharyngeal swab, presence of viral mutation(s) within the areas targeted by this assay, and inadequate number of viral copies(<138 copies/mL). A negative result must be combined with clinical observations, patient history, and epidemiological information. The expected result is Negative.  Fact Sheet for Patients:  BloggerCourse.com  Fact Sheet for Healthcare Providers:  SeriousBroker.it  This test is no t yet approved or cleared by the [place]  FDA and  has been authorized for detection and/or diagnosis of SARS-CoV-2 by FDA under an Emergency Use Authorization (EUA). This EUA will remain  in effect (meaning this test can be used) for the duration of the COVID-19 declaration under  Section 564(b)(1) of the Act, 21 U.S.C.section 360bbb-3(b)(1), unless the authorization is terminated  or revoked sooner.       Influenza A by PCR NEGATIVE NEGATIVE   Influenza B by PCR NEGATIVE NEGATIVE    Comment: (NOTE) The Xpert Xpress SARS-CoV-2/FLU/RSV plus assay is intended as an aid in the diagnosis of influenza from Nasopharyngeal swab specimens and should not be used as a sole basis for treatment. Nasal washings and aspirates are unacceptable for Xpert Xpress SARS-CoV-2/FLU/RSV testing.  Fact Sheet for Patients: BloggerCourse.com  Fact Sheet for Healthcare Providers: SeriousBroker.it  This test is not yet approved or cleared by the [place]  FDA and has been authorized for detection and/or diagnosis of SARS-CoV-2 by FDA under an Emergency Use Authorization (EUA). This EUA will remain in effect (meaning this test can be used) for the duration of the COVID-19 declaration under Section 564(b)(1) of the Act, 21 U.S.C. section 360bbb-3(b)(1), unless the authorization is terminated or revoked.      Resp Syncytial Virus by PCR NEGATIVE NEGATIVE    Comment: (NOTE) Fact Sheet for Patients: BloggerCourse.com  Fact Sheet for Healthcare Providers: SeriousBroker.it  This test is not yet approved or cleared by the [place]  FDA and has been authorized for detection and/or diagnosis of SARS-CoV-2 by FDA under an Emergency Use Authorization (EUA). This EUA will remain in effect (meaning this test can be used) for the duration of the COVID-19 declaration under Section 564(b)(1) of the Act, 21 U.S.C. section 360bbb-3(b)(1), unless the authorization is terminated or revoked.  Performed at [facility], [address], [place], [place] [address]   Comprehensive metabolic panel     Status: Abnormal   Collection Time: 04/21/23 11:26 AM  Result Value Ref Range   Sodium 137 135 - 145 mmol/L   Potassium 4.3 3.5 - 5.1 mmol/L   Chloride 100 98 - 111 mmol/L   CO2 20 (L) 22 - 32 mmol/L   Glucose, Bld 604 (HH) 70 - 99 mg/dL    Comment: CRITICAL RESULT CALLED TO, READ BACK BY AND VERIFIED WITH S. LAMB,RN ON 04/21/2023 AT 1220 BY SL Glucose reference range applies only to samples taken after fasting for at least 8 hours.    BUN 56 (H) 8 - 23 mg/dL   Creatinine, Ser 2.13 (H) 0.61 - 1.24 mg/dL   Calcium 9.8 8.9 - 08.6 mg/dL   Total Protein 8.9 (H) 6.5 - 8.1 g/dL   Albumin 3.2 (L) 3.5 - 5.0 g/dL   AST 14 (L) 15 - 41 U/L   ALT 9 0 - 44 U/L   Alkaline Phosphatase 75 38 - 126 U/L   Total Bilirubin 1.3 (H) 0.0 - 1.2 mg/dL   GFR, Estimated 48 (L) >60 mL/min    Comment: (NOTE) Calculated using the CKD-EPI Creatinine Equation (2021)    Anion gap 17 (H) 5 - 15    Comment: Performed at [facility], [address] [place], [place] [address]  CBC with Differential     Status: Abnormal   Collection Time: 04/21/23 11:26 AM  Result Value Ref Range   WBC 17.2 (H) 4.0 - 10.5 K/uL   RBC 5.02 4.22 - 5.81 MIL/uL    Hemoglobin 16.9 13.0 - 17.0 g/dL   HCT 96.2 (H) 95.2 - 84.1 %   MCV 103.8 (H) 80.0 - 100.0 fL   MCH 33.7 26.0 - 34.0 pg   MCHC 32.4 30.0 - 36.0 g/dL   RDW 32.4 (H) 40.1 - 02.7 %  Platelets 690 (H) 150 - 400 K/uL   nRBC 0.0 0.0 - 0.2 %   Neutrophils Relative % 88 %   Neutro Abs 15.1 (H) 1.7 - 7.7 K/uL   Lymphocytes Relative 5 %   Lymphs Abs 0.8 0.7 - 4.0 K/uL   Monocytes Relative 6 %   Monocytes Absolute 1.1 (H) 0.1 - 1.0 K/uL   Eosinophils Relative 0 %   Eosinophils Absolute 0.0 0.0 - 0.5 K/uL   Basophils Relative 0 %   Basophils Absolute 0.1 0.0 - 0.1 K/uL   Immature Granulocytes 1 %   Abs Immature Granulocytes 0.20 (H) 0.00 - 0.07 K/uL    Comment: Performed at [facility], [address] [place], [place] [address]  Protime-INR     Status: Abnormal   Collection Time: 04/21/23 11:26 AM  Result Value Ref Range   Prothrombin Time 15.9 (H) 11.4 - 15.2 seconds   INR 1.3 (H) 0.8 - 1.2    Comment: (NOTE) INR goal varies based on device and disease states. Performed at [facility], [address] [place], [place] [address]   Blood gas, venous (at [facility])     Status: Abnormal   Collection Time: 04/21/23 11:45 AM  Result Value Ref Range   pH, Ven 7.4 7.25 - 7.43   pCO2, Ven 35 (L) 44 - 60 mmHg   pO2, Ven 53 (H) 32 - 45 mmHg   Bicarbonate 21.7 20.0 - 28.0 mmol/L   Acid-base deficit 2.4 (H) 0.0 - 2.0 mmol/L   O2 Saturation 85.4 %   Patient temperature 37.0     Comment: Performed at [facility], [address] [place], [place] [address]  CBG monitoring, ED     Status: Abnormal   Collection Time: 04/21/23 12:56 PM  Result Value Ref Range   Glucose-Capillary 513 (HH) 70 - 99 mg/dL    Comment: Glucose reference range applies only to samples taken after fasting for at least 8 hours.   Comment 1 Document in Chart   CBG monitoring, ED     Status: Abnormal   Collection Time: 04/21/23  1:46 PM  Result Value Ref Range    Glucose-Capillary 457 (H) 70 - 99 mg/dL    Comment: Glucose reference range applies only to samples taken after fasting for at least 8 hours.  I-Stat Lactic Acid, ED     Status: Abnormal   Collection Time: 04/21/23  1:56 PM  Result Value Ref Range   Lactic Acid, Venous 3.2 (HH) 0.5 - 1.9 mmol/L   Comment NOTIFIED PHYSICIAN   CBG monitoring, ED     Status: Abnormal   Collection Time: 04/21/23  2:59 PM  Result Value Ref Range   Glucose-Capillary 239 (H) 70 - 99 mg/dL    Comment: Glucose reference range applies only to samples taken after fasting for at least 8 hours.  CBG monitoring, ED     Status: Abnormal   Collection Time: 04/21/23  4:04 PM  Result Value Ref Range   Glucose-Capillary 417 (H) 70 - 99 mg/dL    Comment: Glucose reference range applies only to samples taken after fasting for at least 8 hours.  CBG monitoring, ED     Status: Abnormal   Collection Time: 04/21/23  4:44 PM  Result Value Ref Range   Glucose-Capillary 416 (H) 70 - 99 mg/dL    Comment: Glucose reference range applies only to samples taken after fasting for at least 8 hours.  CBG  monitoring, ED     Status: Abnormal   Collection Time: 04/21/23  5:24 PM  Result Value Ref Range   Glucose-Capillary 338 (H) 70 - 99 mg/dL    Comment: Glucose reference range applies only to samples taken after fasting for at least 8 hours.  CBG monitoring, ED     Status: Abnormal   Collection Time: 04/21/23  6:21 PM  Result Value Ref Range   Glucose-Capillary 355 (H) 70 - 99 mg/dL    Comment: Glucose reference range applies only to samples taken after fasting for at least 8 hours.  Lactic acid, plasma     Status: Abnormal   Collection Time: 04/21/23  6:40 PM  Result Value Ref Range   Lactic Acid, Venous 3.4 (HH) 0.5 - 1.9 mmol/L    Comment: CRITICAL RESULT CALLED TO, READ BACK BY AND VERIFIED WITH Donnald Garre, RN 04/21/23 1953 J. COLE Performed at [facility], [address], [place], [place] [address]   CBG  monitoring, ED     Status: Abnormal   Collection Time: 04/21/23  7:28 PM  Result Value Ref Range   Glucose-Capillary 320 (H) 70 - 99 mg/dL    Comment: Glucose reference range applies only to samples taken after fasting for at least 8 hours.  APTT     Status: None   Collection Time: 04/21/23  7:42 PM  Result Value Ref Range   aPTT 32 24 - 36 seconds    Comment: Performed at [facility], [address], [place], [place] [address]  Procalcitonin     Status: None   Collection Time: 04/21/23  7:42 PM  Result Value Ref Range   Procalcitonin <0.10 ng/mL    Comment:        Interpretation: PCT (Procalcitonin) <= 0.5 ng/mL: Systemic infection (sepsis) is not likely. Local bacterial infection is possible. (NOTE)       Sepsis PCT Algorithm           Lower Respiratory Tract                                      Infection PCT Algorithm    ----------------------------     ----------------------------         PCT < 0.25 ng/mL                PCT < 0.10 ng/mL          Strongly encourage             Strongly discourage   discontinuation of antibiotics    initiation of antibiotics    ----------------------------     -----------------------------       PCT 0.25 - 0.50 ng/mL            PCT 0.10 - 0.25 ng/mL               OR       >80% decrease in PCT            Discourage initiation of                                            antibiotics      Encourage discontinuation           of antibiotics    ----------------------------     -----------------------------  PCT >= 0.50 ng/mL              PCT 0.26 - 0.50 ng/mL               AND        <80% decrease in PCT             Encourage initiation of                                             antibiotics       Encourage continuation           of antibiotics    ----------------------------     -----------------------------        PCT >= 0.50 ng/mL                  PCT > 0.50 ng/mL               AND         increase in PCT                   Strongly encourage                                      initiation of antibiotics    Strongly encourage escalation           of antibiotics                                     -----------------------------                                           PCT <= 0.25 ng/mL                                                 OR                                        > 80% decrease in PCT                                      Discontinue / Do not initiate                                             antibiotics  Performed at [facility], [address] [place], [place] [address]   CK     Status: None   Collection Time: 04/21/23  7:42 PM  Result Value Ref Range   Total CK 53 49 - 397 U/L    Comment: Performed at [facility], [address], [place], [place] [address]  Magnesium  Status: None   Collection Time: 04/21/23  7:42 PM  Result Value Ref Range   Magnesium 2.1 1.7 - 2.4 mg/dL    Comment: Performed at [facility], [address] [place], [place] [address]  Phosphorus     Status: Abnormal   Collection Time: 04/21/23  7:42 PM  Result Value Ref Range   Phosphorus 1.5 (L) 2.5 - 4.6 mg/dL    Comment: Performed at [facility], [address], [place], [place] [address]  Troponin I (High Sensitivity)     Status: None   Collection Time: 04/21/23  7:42 PM  Result Value Ref Range   Troponin I (High Sensitivity) 17 <18 ng/L    Comment: (NOTE) Elevated high sensitivity troponin I (hsTnI) values and significant  changes across serial measurements may suggest ACS but many other  chronic and acute conditions are known to elevate hsTnI results.  Refer to the "Links" section for chest pain algorithms and additional  guidance. Performed at [facility], [address], [place], [place] [address]   Troponin I (High Sensitivity)     Status: None   Collection Time: 04/21/23  7:42 PM   Result Value Ref Range   Troponin I (High Sensitivity) 17 <18 ng/L    Comment: (NOTE) Elevated high sensitivity troponin I (hsTnI) values and significant  changes across serial measurements may suggest ACS but many other  chronic and acute conditions are known to elevate hsTnI results.  Refer to the "Links" section for chest pain algorithms and additional  guidance. Performed at [facility], [address], [place], [place] [address]   Basic metabolic panel with GFR     Status: Abnormal   Collection Time: 04/21/23  7:42 PM  Result Value Ref Range   Sodium 141 135 - 145 mmol/L   Potassium 3.4 (L) 3.5 - 5.1 mmol/L   Chloride 109 98 - 111 mmol/L   CO2 23 22 - 32 mmol/L   Glucose, Bld 314 (H) 70 - 99 mg/dL    Comment: Glucose reference range applies only to samples taken after fasting for at least 8 hours.   BUN 47 (H) 8 - 23 mg/dL   Creatinine, Ser 2.95 0.61 - 1.24 mg/dL   Calcium 9.5 8.9 - 10.3 mg/dL   GFR, Estimated >28 >41 mL/min    Comment: (NOTE) Calculated using the CKD-EPI Creatinine Equation (2021)    Anion gap 9 5 - 15    Comment: Performed at [facility], [address], [place], [place] [address]  CBG monitoring, ED     Status: Abnormal   Collection Time: 04/21/23  9:22 PM  Result Value Ref Range   Glucose-Capillary 212 (H) 70 - 99 mg/dL    Comment: Glucose reference range applies only to samples taken after fasting for at least 8 hours.   CT CHEST ABDOMEN PELVIS W CONTRAST Result Date: 04/21/2023 CLINICAL DATA:  Sepsis EXAM: CT CHEST, ABDOMEN, AND PELVIS WITH CONTRAST TECHNIQUE: Multidetector CT imaging of the chest, abdomen and pelvis was performed following the standard protocol during bolus administration of intravenous contrast. RADIATION DOSE REDUCTION: This exam was performed according to the departmental dose-optimization program which includes automated exposure control, adjustment of the mA and/or kV according to patient  size and/or use of iterative reconstruction technique. CONTRAST:  100mL OMNIPAQUE IOHEXOL 300 MG/ML  SOLN COMPARISON:  None Available. FINDINGS: CT CHEST FINDINGS Cardiovascular: Heart is normal size. Densely calcified coronary arteries. Aneurysmal dilatation of  the ascending thoracic aorta measuring 4.5 cm. Calcified and noncalcified irregular plaque throughout the descending thoracic aorta. No dissection. No filling defects in the pulmonary arteries to suggest pulmonary emboli. Mediastinum/Nodes: No mediastinal, hilar, or axillary adenopathy. Trachea and esophagus are unremarkable. Thyroid unremarkable. Lungs/Pleura: Small left pleural effusion. Dependent atelectasis in the lower lobes bilaterally. Musculoskeletal: Chest wall soft tissues are unremarkable. No acute bony abnormality CT ABDOMEN PELVIS FINDINGS Hepatobiliary: Small layering gallstones within the gallbladder. Diffuse low-density throughout the liver compatible with fatty infiltration. No focal hepatic abnormality. Pancreas: No focal abnormality or ductal dilatation. Spleen: Splenomegaly with a craniocaudal length of 17 cm. No focal abnormality. Adrenals/Urinary Tract: No adrenal abnormality. No focal renal abnormality. No stones or hydronephrosis. Urinary bladder is unremarkable. Stomach/Bowel: Large stool burden throughout the colon. Moderate hiatal hernia. No bowel obstruction or inflammatory process. Few scattered sigmoid diverticula. Vascular/Lymphatic: Aortic atherosclerosis. No evidence of aneurysm or adenopathy. Reproductive: No visible focal abnormality. Other: No free fluid or free air. Musculoskeletal: Moderate L2 compression fracture, age indeterminate. Normal alignment. Degenerative disc and facet disease throughout the lumbar spine. IMPRESSION: 4.5 cm ascending thoracic aortic aneurysm. Recommend semi-annual imaging followup by CTA or MRA and referral to cardiothoracic surgery if not already obtained. This recommendation follows 2010  ACCF/AHA/AATS/ACR/ASA/SCA/SCAI/SIR/STS/SVM Guidelines for the Diagnosis and Management of Patients With Thoracic Aortic Disease. Circulation. 2010; 121: Z610-R604. Aortic aneurysm NOS (ICD10-I71.9) Coronary artery disease, aortic atherosclerosis. Small left pleural effusion. Bibasilar dependent opacities, likely atelectasis. Moderate hiatal hernia. Moderate stool burden throughout the colon. Splenomegaly. Age-indeterminate moderate L2 compression fracture. Electronically Signed   By: Charlett Nose M.D.   On: 04/21/2023 18:25   CT HEAD WO CONTRAST ( ) Result Date: 04/21/2023 CLINICAL DATA:  Mental status change, unknown cause.  Sepsis. EXAM: CT HEAD WITHOUT CONTRAST TECHNIQUE: Contiguous axial images were obtained from the base of the skull through the vertex without intravenous contrast. RADIATION DOSE REDUCTION: This exam was performed according to the departmental dose-optimization program which includes automated exposure control, adjustment of the mA and/or kV according to patient size and/or use of iterative reconstruction technique. COMPARISON:  Head MRI 10/17/2021 FINDINGS: Brain: There is no evidence of an acute infarct, intracranial hemorrhage, mass, midline shift, or extra-axial fluid collection. Periventricular white matter hypodensities are nonspecific but compatible with mild chronic small vessel ischemic disease. Moderate to severe cerebral atrophy is again noted including prominent perisylvian volume loss. Vascular: No hyperdense vessel or unexpected calcification. Skull: No acute fracture or suspicious lesion. Sinuses/Orbits: Visualized paranasal sinuses and mastoid air cells are clear. Bilateral cataract extraction. Other: None. IMPRESSION: 1. No evidence of acute intracranial abnormality. 2. Mild chronic small vessel ischemic disease and moderate to severe cerebral atrophy. Electronically Signed   By: Sebastian Ache M.D.   On: 04/21/2023 18:05   DG Chest Port 1 View Result Date:  04/21/2023 CLINICAL DATA:  Questionable sepsis. EXAM: PORTABLE CHEST 1 VIEW COMPARISON:  12/11/2022 FINDINGS: Low volume film. Mild asymmetric elevation right hemidiaphragm. Subtle airspace disease noted left base, possible pneumonia. No pneumothorax or pleural effusion. No acute bony abnormality. IMPRESSION: Subtle airspace disease left base, possible pneumonia. Electronically Signed   By: Kennith Center M.D.   On: 04/21/2023 11:10    Pending Labs Unresulted Labs (From admission, onward)     Start     Ordered   04/22/23 0500  Prealbumin  Tomorrow morning,   R        04/21/23 1908   04/22/23 0500  Creatinine, serum  Tomorrow morning,   R  04/21/23 2009   04/22/23 0100  Basic metabolic panel  Once,   R        04/21/23 1927   04/21/23 2123  Vitamin B1  Add-on,   AD        04/21/23 2122   04/21/23 1959  MRSA Next Gen by PCR, Nasal  Once,   R       Question Answer Comment  Patient immune status Normal   Release to patient Immediate      04/21/23 1958   04/21/23 1909  Respiratory (~20 pathogens) panel by PCR  (Respiratory panel by PCR (~20 pathogens, ~24 hr TAT)  w precautions)  Once,   R        04/21/23 1908   04/21/23 1859  Urine Culture (for pregnant, neutropenic or urologic patients or patients with an indwelling urinary catheter)  (Urine Culture)  ONCE - URGENT,   URGENT       Question Answer Comment  Indication Sepsis   Patient immune status Normal      04/21/23 1859   04/21/23 1859  Expectorated Sputum Assessment w Gram Stain, Rflx to Resp Cult  ONCE - URGENT,   URGENT       Question Answer Comment  Patient immune status Normal   Release to patient Immediate      04/21/23 1859   04/21/23 1036  Urinalysis, w/ Reflex to Culture (Infection Suspected) -Urine, Clean Catch  (Undifferentiated presentation (screening labs and basic nursing orders))  ONCE - URGENT,   URGENT       Question:  Specimen Source  Answer:  Urine, Clean Catch   04/21/23 1035   Signed and Held  Magnesium   Tomorrow morning,   R        Signed and Held   Signed and Held  Phosphorus  Tomorrow morning,   R        Signed and Held   Signed and Held  Comprehensive metabolic panel  Tomorrow morning,   R       Question:  Release to patient  Answer:  Immediate   Signed and Held   Signed and Held  CBC  Tomorrow morning,   R       Question:  Release to patient  Answer:  Immediate   Signed and Held            Vitals/Pain Today's Vitals   04/21/23 2045 04/21/23 2100 04/21/23 2115 04/21/23 2130  BP: 115/78 106/73 124/74 130/77  Pulse: 94 94 94 (!) 47  Resp: (!) 21 (!) 24 16 19   Temp:      TempSrc:      SpO2: 94% 97% 97% 96%  PainSc:        Isolation Precautions Droplet precaution  Medications Medications  lactated ringers infusion (0 mLs Intravenous Stopped 04/21/23 1935)  dextrose 50 % solution 0-50 mL (has no administration in time range)  insulin regular, human (MYXREDLIN) 100 units/ 100 mL infusion (6 Units/hr Intravenous Rate/Dose Change 04/21/23 2123)  lactated ringers infusion (has no administration in time range)  metroNIDAZOLE (FLAGYL) IVPB 500 mg (has no administration in time range)  metoprolol tartrate (LOPRESSOR) tablet 25 mg (has no administration in time range)  ceFEPIme (MAXIPIME) 2 g in sodium chloride 0.9 % 100 mL IVPB (has no administration in time range)  0.9 %  sodium chloride infusion ( Intravenous New Bag/Given 04/21/23 2047)  vancomycin (VANCOCIN) IVPB 1000 mg/200 mL premix (has no administration in time range)  potassium PHOSPHATE 15  mmol in dextrose 5 % 250 mL infusion (has no administration in time range)  thiamine (VITAMIN B1) injection 100 mg (has no administration in time range)  lactated ringers bolus 1,000 mL (0 mLs Intravenous Stopped 04/21/23 1349)  ceFEPIme (MAXIPIME) 2 g in sodium chloride 0.9 % 100 mL IVPB (0 g Intravenous Stopped 04/21/23 1209)  metroNIDAZOLE (FLAGYL) IVPB 500 mg (0 mg Intravenous Stopped 04/21/23 1333)  vancomycin (VANCOCIN) IVPB 1000  mg/200 mL premix (0 mg Intravenous Stopped 04/21/23 1458)  potassium chloride 10 mEq in 100 mL IVPB (0 mEq Intravenous Stopped 04/21/23 1459)  iohexol (OMNIPAQUE) 300 MG/ML solution 100 mL (100 mLs Intravenous Contrast Given 04/21/23 1517)    Mobility walks with device     Focused Assessments     R Recommendations: See Admitting Provider Note  Report given to:   Additional Notes:

## 2023-04-21 NOTE — Progress Notes (Signed)
 Pharmacy Antibiotic Note  [name] is a 89 y.o. male admitted on 04/21/2023 with sepsis.  Pharmacy has been consulted for vancomycin and cefepime dosing.  Today, 04/21/23 Most recent weight = 86 kg from Sept 2023 used for dose calculations until updated information can be obtained. STAT ht/wt order entered.  Using SCr 1.42, calculate CrCl of ~43 mL/min WBC, lactate elevated. Febrile  Plan: Cefepime 2 g IV q12h Vancomycin 1000 mg IV q24h for estimated AUC of 435.  Goal AUC 400-550. Check levels as needed Monitor renal function     Temp (24hrs), Avg:99.1 F (37.3 C), Min:97.6 F (36.4 C), Max:100.6 F (38.1 C)  Recent Labs  Lab 04/21/23 1116 04/21/23 1126 04/21/23 1356  WBC  --  17.2*  --   CREATININE  --  1.42*  --   LATICACIDVEN 2.1*  --  3.2*    CrCl cannot be calculated (Unknown ideal weight.).    No Known Allergies  Antimicrobials this admission: cefepime 4/14 >>  vancomycin 4/14 >>   Kaylany Tesoriero M Nicosha Struve, PharmD 04/21/2023 7:19 PM

## 2023-04-21 NOTE — ED Provider Notes (Signed)
 Care transferred to me.  Patient's CT is overall unrevealing.  He is being treated broadly for sepsis with broad-spectrum antibiotics, fluids, and glucose control with insulin. Dr. Hendrick Locke will admit.   Jerilynn Montenegro, MD 04/21/23 [phone]

## 2023-04-21 NOTE — Assessment & Plan Note (Signed)
 Will replace and repeat

## 2023-04-21 NOTE — Assessment & Plan Note (Signed)
 If chronic leukocytosis.  Continue to monitor hold off on hydroxyurea given acute illness

## 2023-04-21 NOTE — Assessment & Plan Note (Signed)
 Severe hyperglycemia Patient started on insulin drip.  Once blood sugar under control may need to transition to long-lasting insulin and sliding scale Severe hypoglycemia could be in the setting of underlying infection will need to look for infection source

## 2023-04-21 NOTE — Assessment & Plan Note (Signed)
Not on statins 

## 2023-04-21 NOTE — ED Provider Notes (Signed)
 [place] EMERGENCY DEPARTMENT AT [facility] Provider Note   CSN: [id number] Arrival date & time: 04/21/23  1030     History  Chief Complaint  Patient presents with   Code Sepsis    [name] is a 89 y.o. male.  HPI   89 year old male [facility] after roughly 3 days of altered mental status.  The patient was reportedly hyperglycemic with CBG of over 500.  He has pressure ulcerations present and has been fairly bedbound since a fall a few months ago with a known pelvic fracture.  On arrival, the patient was fairly nonverbal and somnolent, GCS 12, febrile to 100.6, tachycardic with atrial fibrillation with RVR in the 120s, BP 108/84, saturating 95% on room air.  Code sepsis initiated on patient arrival as the patient has known pressure ulceration which could be source of infection.  Home Medications Prior to Admission medications   Medication Sig Start Date End Date Taking? Authorizing Provider  acetaminophen (TYLENOL) 650 MG CR tablet Take 650 mg by mouth every 8 (eight) hours as needed for pain.    [provider]  ammonium lactate (LAC-HYDRIN) 12 % lotion 1 Application at bedtime. Apply to bilateral legs and feet; do not apply to right heel wound 08/05/22   [provider]  ASPIRIN 81 PO Take 81 mg by mouth daily.    [provider]  carbidopa-levodopa (SINEMET IR) 25-100 MG tablet Take 2 tablets by mouth 3 (three) times daily. At 8am, noon, 5pm Patient taking differently: Take 1 tablet by mouth at bedtime. 01/01/22   Levert Feinstein, MD  furosemide (LASIX) 40 MG tablet Take 1 tablet (40 mg total) by mouth as needed for edema. 12/16/22 03/16/23  Rai, Delene Ruffini, MD  glimepiride (AMARYL) 4 MG tablet Take 4 mg by mouth daily with breakfast. 04/22/12   [provider]  hydroxyurea (HYDREA) 500 MG capsule TAKE 1 CAPSULE BY MOUTH ONCE DAILY WITH FOOD 03/29/21   Si Gaul, MD  lactulose (CHRONULAC) 10 GM/15ML  solution Take 30 mLs (20 g total) by mouth 2 (two) times daily as needed for moderate constipation or severe constipation. 12/16/22   Rai, Delene Ruffini, MD  Menthol, Topical Analgesic, (BIOFREEZE ROLL-ON) 4 % GEL Apply 1 Application topically every 4 (four) hours as needed. For mild/moderate pain    [provider]  methocarbamol (ROBAXIN) 500 MG tablet Take 1 tablet (500 mg total) by mouth every 6 (six) hours as needed for muscle spasms. 12/16/22   Rai, Delene Ruffini, MD  metoprolol tartrate (LOPRESSOR) 25 MG tablet Take 1 tablet (25 mg total) by mouth 2 (two) times daily. 12/16/22   Rai, Delene Ruffini, MD  Multiple Vitamin (MULTIVITAMIN WITH MINERALS) TABS tablet Take 1 tablet by mouth every morning.    [provider]  oxyCODONE (OXY IR/ROXICODONE) 5 MG immediate release tablet Take 1 tablet (5 mg total) by mouth every 6 (six) hours as needed for moderate pain (pain score 4-6) or severe pain (pain score 7-10). 12/16/22   Rai, Ripudeep Kirtland Bouchard, MD  pantoprazole (PROTONIX) 40 MG tablet Take 1 tablet (40 mg total) by mouth daily at 6 (six) AM. 12/17/22   Rai, Ripudeep K, MD  polyethylene glycol (MIRALAX / GLYCOLAX) 17 g packet Take 17 g by mouth daily.    [provider]  PROSOURCE PROTEIN PO Take 1 Bottle by mouth daily. Med Plus 2.0    [provider]  senna (SENOKOT) 8.6 MG TABS  tablet Take 1 tablet (8.6 mg total) by mouth 2 (two) times daily. 12/16/22   Rai, Ripudeep K, MD  zinc oxide 20 % ointment Apply 1 Application topically 3 (three) times daily as needed for irritation. Apply to sacrum/coccyx every shift    [provider]      Allergies    Patient has no known allergies.    Review of Systems   Review of Systems  Unable to perform ROS: Mental status change    Physical Exam Updated Vital Signs BP 115/78   Pulse 94   Temp 97.8 F (36.6 C) (Oral)   Resp (!) 21   SpO2 94%  Physical Exam Vitals and nursing note reviewed.  Constitutional:       General: He is not in acute distress.    Appearance: He is well-developed. He is ill-appearing.     Comments: GCS 12, will arouse to painful stimuli and follow commands  HENT:     Head: Normocephalic and atraumatic.  Eyes:     Conjunctiva/sclera: Conjunctivae normal.  Cardiovascular:     Rate and Rhythm: Tachycardia present. Rhythm irregular.  Pulmonary:     Effort: Pulmonary effort is normal. No respiratory distress.     Breath sounds: Normal breath sounds.  Abdominal:     Palpations: Abdomen is soft.     Tenderness: There is no abdominal tenderness.  Musculoskeletal:        General: No swelling.     Cervical back: Neck supple.     Comments: Large stage IV pressure ulceration as depicted in the photograph below, surrounding erythema and purulent drainage  Skin:    General: Skin is warm and dry.     Capillary Refill: Capillary refill takes less than 2 seconds.  Psychiatric:        Mood and Affect: Mood normal.     ED Results / Procedures / Treatments   Labs (all labs ordered are listed, but only abnormal results are displayed) Labs Reviewed  BETA-HYDROXYBUTYRIC ACID - Abnormal; Notable for the following components:      Result Value   Beta-Hydroxybutyric Acid 3.59 (*)    All other components within normal limits  COMPREHENSIVE METABOLIC PANEL WITH GFR - Abnormal; Notable for the following components:   CO2 20 (*)    Glucose, Bld 604 (*)    BUN 56 (*)    Creatinine, Ser 1.42 (*)    Total Protein 8.9 (*)    Albumin 3.2 (*)    AST 14 (*)    Total Bilirubin 1.3 (*)    GFR, Estimated 48 (*)    Anion gap 17 (*)    All other components within normal limits  CBC WITH DIFFERENTIAL/PLATELET - Abnormal; Notable for the following components:   WBC 17.2 (*)    HCT 52.1 (*)    MCV 103.8 (*)    RDW 15.9 (*)    Platelets 690 (*)    Neutro Abs 15.1 (*)    Monocytes Absolute 1.1 (*)    Abs Immature Granulocytes 0.20 (*)    All other components within normal limits  PROTIME-INR  - Abnormal; Notable for the following components:   Prothrombin Time 15.9 (*)    INR 1.3 (*)    All other components within normal limits  BLOOD GAS, VENOUS - Abnormal; Notable for the following components:   pCO2, Ven 35 (*)    pO2, Ven 53 (*)    Acid-base deficit 2.4 (*)    All other components within  normal limits  OSMOLALITY - Abnormal; Notable for the following components:   Osmolality 355 (*)    All other components within normal limits  LACTIC ACID, PLASMA - Abnormal; Notable for the following components:   Lactic Acid, Venous 3.4 (*)    All other components within normal limits  PHOSPHORUS - Abnormal; Notable for the following components:   Phosphorus 1.5 (*)    All other components within normal limits  BASIC METABOLIC PANEL WITH GFR - Abnormal; Notable for the following components:   Potassium 3.4 (*)    Glucose, Bld 314 (*)    BUN 47 (*)    All other components within normal limits  CBG MONITORING, ED - Abnormal; Notable for the following components:   Glucose-Capillary 576 (*)    All other components within normal limits  I-STAT CG4 LACTIC ACID, ED - Abnormal; Notable for the following components:   Lactic Acid, Venous 2.1 (*)    All other components within normal limits  I-STAT CG4 LACTIC ACID, ED - Abnormal; Notable for the following components:   Lactic Acid, Venous 3.2 (*)    All other components within normal limits  CBG MONITORING, ED - Abnormal; Notable for the following components:   Glucose-Capillary 513 (*)    All other components within normal limits  CBG MONITORING, ED - Abnormal; Notable for the following components:   Glucose-Capillary 457 (*)    All other components within normal limits  CBG MONITORING, ED - Abnormal; Notable for the following components:   Glucose-Capillary 239 (*)    All other components within normal limits  CBG MONITORING, ED - Abnormal; Notable for the following components:   Glucose-Capillary 417 (*)    All other components  within normal limits  CBG MONITORING, ED - Abnormal; Notable for the following components:   Glucose-Capillary 416 (*)    All other components within normal limits  CBG MONITORING, ED - Abnormal; Notable for the following components:   Glucose-Capillary 338 (*)    All other components within normal limits  CBG MONITORING, ED - Abnormal; Notable for the following components:   Glucose-Capillary 355 (*)    All other components within normal limits  CBG MONITORING, ED - Abnormal; Notable for the following components:   Glucose-Capillary 320 (*)    All other components within normal limits  CULTURE, BLOOD (ROUTINE X 2)  CULTURE, BLOOD (ROUTINE X 2)  RESP PANEL BY RT-PCR (RSV, FLU A&B, COVID)  RVPGX2  URINE CULTURE  EXPECTORATED SPUTUM ASSESSMENT W GRAM STAIN, RFLX TO RESP C  RESPIRATORY PANEL BY PCR  MRSA NEXT GEN BY PCR, NASAL  APTT  CK  MAGNESIUM  URINALYSIS, W/ REFLEX TO CULTURE (INFECTION SUSPECTED)  PROCALCITONIN  PREALBUMIN  BASIC METABOLIC PANEL WITH GFR  CREATININE, SERUM  I-STAT VENOUS BLOOD GAS, ED  TROPONIN I (HIGH SENSITIVITY)  TROPONIN I (HIGH SENSITIVITY)  TROPONIN I (HIGH SENSITIVITY)    EKG EKG Interpretation Date/Time:  Monday April 21 2023 10:56:26 EDT Ventricular Rate:  115 PR Interval:    QRS Duration:  81 QT Interval:  322 QTC Calculation: 436 R Axis:   18  Text Interpretation: Atrial fibrillation with rapid ventricular response Paired ventricular premature complexes Confirmed by Rosealee Concha ([id number]) on 04/21/2023 8:51:42 PM  Radiology CT CHEST ABDOMEN PELVIS W CONTRAST Result Date: 04/21/2023 CLINICAL DATA:  Sepsis EXAM: CT CHEST, ABDOMEN, AND PELVIS WITH CONTRAST TECHNIQUE: Multidetector CT imaging of the chest, abdomen and pelvis was performed following the standard protocol during bolus administration of  intravenous contrast. RADIATION DOSE REDUCTION: This exam was performed according to the departmental dose-optimization program which includes  automated exposure control, adjustment of the mA and/or kV according to patient size and/or use of iterative reconstruction technique. CONTRAST:  OMNIPAQUE IOHEXOL 300 MG/ML  SOLN COMPARISON:  None Available. FINDINGS: CT CHEST FINDINGS Cardiovascular: Heart is normal size. Densely calcified coronary arteries. Aneurysmal dilatation of the ascending thoracic aorta measuring 4.5 cm. Calcified and noncalcified irregular plaque throughout the descending thoracic aorta. No dissection. No filling defects in the pulmonary arteries to suggest pulmonary emboli. Mediastinum/Nodes: No mediastinal, hilar, or axillary adenopathy. Trachea and esophagus are unremarkable. Thyroid unremarkable. Lungs/Pleura: Small left pleural effusion. Dependent atelectasis in the lower lobes bilaterally. Musculoskeletal: Chest wall soft tissues are unremarkable. No acute bony abnormality CT ABDOMEN PELVIS FINDINGS Hepatobiliary: Small layering gallstones within the gallbladder. Diffuse low-density throughout the liver compatible with fatty infiltration. No focal hepatic abnormality. Pancreas: No focal abnormality or ductal dilatation. Spleen: Splenomegaly with a craniocaudal length of 17 cm. No focal abnormality. Adrenals/Urinary Tract: No adrenal abnormality. No focal renal abnormality. No stones or hydronephrosis. Urinary bladder is unremarkable. Stomach/Bowel: Large stool burden throughout the colon. Moderate hiatal hernia. No bowel obstruction or inflammatory process. Few scattered sigmoid diverticula. Vascular/Lymphatic: Aortic atherosclerosis. No evidence of aneurysm or adenopathy. Reproductive: No visible focal abnormality. Other: No free fluid or free air. Musculoskeletal: Moderate L2 compression fracture, age indeterminate. Normal alignment. Degenerative disc and facet disease throughout the lumbar spine. IMPRESSION: 4.5 cm ascending thoracic aortic aneurysm. Recommend semi-annual imaging followup by CTA or MRA and referral to  cardiothoracic surgery if not already obtained. This recommendation follows 2010 ACCF/AHA/AATS/ACR/ASA/SCA/SCAI/SIR/STS/SVM Guidelines for the Diagnosis and Management of Patients With Thoracic Aortic Disease. Circulation. 2010; 121: N629-B284. Aortic aneurysm NOS (ICD10-I71.9) Coronary artery disease, aortic atherosclerosis. Small left pleural effusion. Bibasilar dependent opacities, likely atelectasis. Moderate hiatal hernia. Moderate stool burden throughout the colon. Splenomegaly. Age-indeterminate moderate L2 compression fracture. Electronically Signed   By: Charlett Nose M.D.   On: 04/21/2023 18:25   CT HEAD WO CONTRAST ( ) Result Date: 04/21/2023 CLINICAL DATA:  Mental status change, unknown cause.  Sepsis. EXAM: CT HEAD WITHOUT CONTRAST TECHNIQUE: Contiguous axial images were obtained from the base of the skull through the vertex without intravenous contrast. RADIATION DOSE REDUCTION: This exam was performed according to the departmental dose-optimization program which includes automated exposure control, adjustment of the mA and/or kV according to patient size and/or use of iterative reconstruction technique. COMPARISON:  Head MRI 10/17/2021 FINDINGS: Brain: There is no evidence of an acute infarct, intracranial hemorrhage, mass, midline shift, or extra-axial fluid collection. Periventricular white matter hypodensities are nonspecific but compatible with mild chronic small vessel ischemic disease. Moderate to severe cerebral atrophy is again noted including prominent perisylvian volume loss. Vascular: No hyperdense vessel or unexpected calcification. Skull: No acute fracture or suspicious lesion. Sinuses/Orbits: Visualized paranasal sinuses and mastoid air cells are clear. Bilateral cataract extraction. Other: None. IMPRESSION: 1. No evidence of acute intracranial abnormality. 2. Mild chronic small vessel ischemic disease and moderate to severe cerebral atrophy. Electronically Signed   By: Sebastian Ache  M.D.   On: 04/21/2023 18:05   DG Chest Port 1 View Result Date: 04/21/2023 CLINICAL DATA:  Questionable sepsis. EXAM: PORTABLE CHEST 1 VIEW COMPARISON:  12/11/2022 FINDINGS: Low volume film. Mild asymmetric elevation right hemidiaphragm. Subtle airspace disease noted left base, possible pneumonia. No pneumothorax or pleural effusion. No acute bony abnormality. IMPRESSION: Subtle airspace disease left base, possible pneumonia. Electronically Signed  By: Kennith Center M.D.   On: 04/21/2023 11:10    Procedures .Critical Care  Performed by: Ernie Avena, MD Authorized by: Ernie Avena, MD   Critical care provider statement:    Critical care time (minutes):  30   Critical care was necessary to treat or prevent imminent or life-threatening deterioration of the following conditions:  Sepsis   Critical care was time spent personally by me on the following activities:  Development of treatment plan with patient or surrogate, discussions with consultants, evaluation of patient's response to treatment, examination of patient, ordering and review of laboratory studies, ordering and review of radiographic studies, ordering and performing treatments and interventions, pulse oximetry, re-evaluation of patient's condition and review of old charts     Medications Ordered in ED Medications  lactated ringers infusion (0 mLs Intravenous Stopped 04/21/23 1935)  dextrose 50 % solution 0-50 mL (has no administration in time range)  insulin regular, human (MYXREDLIN) 100 units/ 100 mL infusion (15 Units/hr Intravenous Rate/Dose Change 04/21/23 1932)  lactated ringers infusion (has no administration in time range)  metroNIDAZOLE (FLAGYL) IVPB 500 mg (has no administration in time range)  metoprolol tartrate (LOPRESSOR) tablet 25 mg (has no administration in time range)  ceFEPIme (MAXIPIME) 2 g in sodium chloride 0.9 % 100 mL IVPB (has no administration in time range)  0.9 %  sodium chloride infusion (  Intravenous New Bag/Given 04/21/23 2047)  vancomycin (VANCOCIN) IVPB 1000 mg/200 mL premix (has no administration in time range)  lactated ringers bolus 1,000 mL (0 mLs Intravenous Stopped 04/21/23 1349)  ceFEPIme (MAXIPIME) 2 g in sodium chloride 0.9 % 100 mL IVPB (0 g Intravenous Stopped 04/21/23 1209)  metroNIDAZOLE (FLAGYL) IVPB 500 mg (0 mg Intravenous Stopped 04/21/23 1333)  vancomycin (VANCOCIN) IVPB 1000 mg/200 mL premix (0 mg Intravenous Stopped 04/21/23 1458)  potassium chloride 10 mEq in 100 mL IVPB (0 mEq Intravenous Stopped 04/21/23 1459)  iohexol (OMNIPAQUE) 300 MG/ML solution 100 mL (100 mLs Intravenous Contrast Given 04/21/23 1517)    ED Course/ Medical Decision Making/ A&P Clinical Course as of 04/21/23 2054  Mon Apr 21, 2023  1137 Glucose-Capillary(!!): 576 [JL]  1442 Lactic Acid, Venous(!!): 3.2 [JL]    Clinical Course User Index [JL] Ernie Avena, MD                                 Medical Decision Making Amount and/or Complexity of Data Reviewed Labs: ordered. Decision-making details documented in ED Course. Radiology: ordered.  Risk Prescription drug management. Decision regarding hospitalization.    89 year old male presenting [facility] after roughly 3 days of altered mental status.  The patient was reportedly hyperglycemic with CBG of over 500.  He has pressure ulcerations present and has been fairly bedbound since a fall a few months ago with a known pelvic fracture.  On arrival, the patient was fairly nonverbal and somnolent, GCS 12, febrile to 100.6, tachycardic with atrial fibrillation with RVR in the 120s, BP 108/84, saturating 95% on room air.  Code sepsis initiated on patient arrival as the patient has known pressure ulceration which could be source of infection.  On arrival, the patient was febrile and tachycardic as per above.  IV access was obtained, septic workup initiated.  Differential diagnosis includes sepsis from pressure  ulceration, other etiologies such as UTI, intra-abdominal infection, respiratory infection.  Additionally considered diabetic ketoacidosis versus HHS.  Initial CBG's were  elevated.  CBC revealed a leukocytosis of 17.2, no anemia, VBG revealed no acidosis with a pH of 7.4, CMP revealed borderline developing DKA with a blood glucose of 604, bicarbonate of 20, anion gap of 17, elevated serum creatinine at 1.42, serum awesome's pending, elevated beta hydroxybutyrate to 3.59.  The patient was covered with broad-spectrum antibiotics to include vancomycin, cefepime and Flagyl.  He was fluid resuscitated.  He was started on an IV insulin infusion.  Given the patient's confusion, CT imaging of the head was performed and revealed IMPRESSION:  1. No evidence of acute intracranial abnormality.  2. Mild chronic small vessel ischemic disease and moderate to severe  cerebral atrophy.   An EKG was performed which revealed atrial fibrillation with rapid ventricular response, rate 115.  No STEMI.  CT imaging of the chest abdomen pelvis was performed and results were pending at time of signout.  The patient's blood sugars improved and his mental status improved on repeat assessment.  CXR: IMPRESSION:  Subtle airspace disease left base, possible pneumonia.    Plan at time of signout to follow-up results of CT imaging with likely plan to admit the patient for further monitoring, signout given to Dr. Aldean Amass at 1700.   Final Clinical Impression(s) / ED Diagnoses Final diagnoses:  Severe sepsis [facility])    Rx / DC Orders ED Discharge Orders     None         Rosealee Concha, MD 04/21/23 2054

## 2023-04-21 NOTE — ED Triage Notes (Signed)
 BIB EMS from [facility] after 3 days of AMS, CBG 582 -110-130 -26-93% RA 98 on 3 L 132/70- #20 L arm LR. Normally ambulates with walker, feeds self, normal conversation.

## 2023-04-21 NOTE — Assessment & Plan Note (Signed)
 Noted to be present at admission  Order would care consult

## 2023-04-21 NOTE — Assessment & Plan Note (Signed)
-  chronic avoid nephrotoxic medications such as NSAIDs, Vanco Zosyn combo,  avoid hypotension, continue to follow renal function

## 2023-04-21 NOTE — Assessment & Plan Note (Signed)
-  SIRS criteria met with  elevated white blood cell count,       Component Value Date/Time   WBC 17.2 (H) 04/21/2023 1126   LYMPHSABS 0.8 04/21/2023 1126   LYMPHSABS 1.6 01/01/2022 1255   LYMPHSABS 1.5 10/29/2016 1018     tachycardia   ,   fever   RR >20 Today's Vitals   04/21/23 1900 04/21/23 1915 04/21/23 1930 04/21/23 1945  BP: (!) 147/78 126/81 (!) 107/92 120/80  Pulse: 98  98 98  Resp: (!) 23 20 (!) 21 20  Temp:    97.8 F (36.6 C)  TempSrc:    Oral  SpO2: 95%  95% 95%  PainSc:          -Most likely source being: Source of sepsis is unknown but given clinical picture will continue to treat   Patient meeting criteria for Severe sepsis with    evidence of end organ damage/organ dysfunction such as   elevated lactic acid >2     Component Value Date/Time   LATICACIDVEN 3.4 (HH) 04/21/2023 1840    acute metabolic encephalopathy  SBP<90 mmhg or MAP < 65 mmhg     - Obtain serial lactic acid and procalcitonin level.  - Initiated IV antibiotics in ER: Antibiotics Given (last 72 hours)     Date/Time Action Medication Dose Rate   04/21/23 1139 New Bag/Given   ceFEPIme (MAXIPIME) 2 g in sodium chloride 0.9 % 100 mL IVPB 2 g 200 mL/hr   04/21/23 1233 New Bag/Given   metroNIDAZOLE (FLAGYL) IVPB 500 mg 500 mg 100 mL/hr   04/21/23 1358 New Bag/Given   vancomycin (VANCOCIN) IVPB 1000 mg/200 mL premix 1,000 mg 200 mL/hr       Will continue  on : Cefepime Vanco and Flagyl  check MRSA  - await results of blood and urine culture  - Rehydrate aggressively  Intravenous fluids were administered    7:57 PM

## 2023-04-22 DIAGNOSIS — R609 Edema, unspecified: Secondary | ICD-10-CM | POA: Diagnosis not present

## 2023-04-22 DIAGNOSIS — K449 Diaphragmatic hernia without obstruction or gangrene: Secondary | ICD-10-CM | POA: Diagnosis not present

## 2023-04-22 DIAGNOSIS — E119 Type 2 diabetes mellitus without complications: Secondary | ICD-10-CM | POA: Diagnosis not present

## 2023-04-22 DIAGNOSIS — G20C Parkinsonism, unspecified: Secondary | ICD-10-CM | POA: Diagnosis not present

## 2023-04-22 DIAGNOSIS — N1831 Chronic kidney disease, stage 3a: Secondary | ICD-10-CM | POA: Diagnosis not present

## 2023-04-22 DIAGNOSIS — L89150 Pressure ulcer of sacral region, unstageable: Secondary | ICD-10-CM | POA: Diagnosis not present

## 2023-04-22 DIAGNOSIS — G9341 Metabolic encephalopathy: Secondary | ICD-10-CM | POA: Diagnosis not present

## 2023-04-22 DIAGNOSIS — I129 Hypertensive chronic kidney disease with stage 1 through stage 4 chronic kidney disease, or unspecified chronic kidney disease: Secondary | ICD-10-CM | POA: Diagnosis not present

## 2023-04-22 DIAGNOSIS — R6510 Systemic inflammatory response syndrome (SIRS) of non-infectious origin without acute organ dysfunction: Secondary | ICD-10-CM | POA: Diagnosis not present

## 2023-04-22 DIAGNOSIS — A419 Sepsis, unspecified organism: Secondary | ICD-10-CM | POA: Diagnosis not present

## 2023-04-22 DIAGNOSIS — L89323 Pressure ulcer of left buttock, stage 3: Secondary | ICD-10-CM | POA: Diagnosis not present

## 2023-04-22 DIAGNOSIS — R4182 Altered mental status, unspecified: Secondary | ICD-10-CM | POA: Diagnosis not present

## 2023-04-22 DIAGNOSIS — R627 Adult failure to thrive: Secondary | ICD-10-CM | POA: Diagnosis not present

## 2023-04-22 DIAGNOSIS — R6520 Severe sepsis without septic shock: Secondary | ICD-10-CM | POA: Diagnosis not present

## 2023-04-22 DIAGNOSIS — E1122 Type 2 diabetes mellitus with diabetic chronic kidney disease: Secondary | ICD-10-CM | POA: Diagnosis not present

## 2023-04-22 DIAGNOSIS — Z7401 Bed confinement status: Secondary | ICD-10-CM | POA: Diagnosis not present

## 2023-04-22 DIAGNOSIS — I7121 Aneurysm of the ascending aorta, without rupture: Secondary | ICD-10-CM | POA: Diagnosis not present

## 2023-04-22 DIAGNOSIS — E1142 Type 2 diabetes mellitus with diabetic polyneuropathy: Secondary | ICD-10-CM | POA: Diagnosis not present

## 2023-04-22 DIAGNOSIS — G9340 Encephalopathy, unspecified: Secondary | ICD-10-CM | POA: Diagnosis not present

## 2023-04-22 DIAGNOSIS — E43 Unspecified severe protein-calorie malnutrition: Secondary | ICD-10-CM | POA: Diagnosis not present

## 2023-04-22 DIAGNOSIS — R531 Weakness: Secondary | ICD-10-CM | POA: Diagnosis not present

## 2023-04-22 DIAGNOSIS — M948X9 Other specified disorders of cartilage, unspecified sites: Secondary | ICD-10-CM | POA: Diagnosis not present

## 2023-04-22 DIAGNOSIS — M869 Osteomyelitis, unspecified: Secondary | ICD-10-CM | POA: Diagnosis not present

## 2023-04-22 DIAGNOSIS — E872 Acidosis, unspecified: Secondary | ICD-10-CM | POA: Diagnosis not present

## 2023-04-22 DIAGNOSIS — G20A1 Parkinson's disease without dyskinesia, without mention of fluctuations: Secondary | ICD-10-CM | POA: Diagnosis not present

## 2023-04-22 DIAGNOSIS — L89313 Pressure ulcer of right buttock, stage 3: Secondary | ICD-10-CM | POA: Diagnosis not present

## 2023-04-22 DIAGNOSIS — Z1152 Encounter for screening for COVID-19: Secondary | ICD-10-CM | POA: Diagnosis not present

## 2023-04-22 DIAGNOSIS — R1319 Other dysphagia: Secondary | ICD-10-CM | POA: Diagnosis not present

## 2023-04-22 DIAGNOSIS — Z515 Encounter for palliative care: Secondary | ICD-10-CM | POA: Diagnosis not present

## 2023-04-22 DIAGNOSIS — I4891 Unspecified atrial fibrillation: Secondary | ICD-10-CM | POA: Diagnosis not present

## 2023-04-22 DIAGNOSIS — Z789 Other specified health status: Secondary | ICD-10-CM | POA: Diagnosis not present

## 2023-04-22 DIAGNOSIS — D471 Chronic myeloproliferative disease: Secondary | ICD-10-CM | POA: Diagnosis not present

## 2023-04-22 DIAGNOSIS — L89153 Pressure ulcer of sacral region, stage 3: Secondary | ICD-10-CM | POA: Diagnosis not present

## 2023-04-22 DIAGNOSIS — Z66 Do not resuscitate: Secondary | ICD-10-CM | POA: Diagnosis not present

## 2023-04-22 DIAGNOSIS — I5032 Chronic diastolic (congestive) heart failure: Secondary | ICD-10-CM | POA: Diagnosis not present

## 2023-04-22 DIAGNOSIS — E1165 Type 2 diabetes mellitus with hyperglycemia: Secondary | ICD-10-CM | POA: Diagnosis not present

## 2023-04-22 DIAGNOSIS — D45 Polycythemia vera: Secondary | ICD-10-CM | POA: Diagnosis not present

## 2023-04-22 DIAGNOSIS — R1310 Dysphagia, unspecified: Secondary | ICD-10-CM | POA: Diagnosis not present

## 2023-04-22 DIAGNOSIS — K626 Ulcer of anus and rectum: Secondary | ICD-10-CM | POA: Diagnosis not present

## 2023-04-22 DIAGNOSIS — Z7189 Other specified counseling: Secondary | ICD-10-CM | POA: Diagnosis not present

## 2023-04-22 DIAGNOSIS — E440 Moderate protein-calorie malnutrition: Secondary | ICD-10-CM | POA: Diagnosis not present

## 2023-04-22 DIAGNOSIS — N179 Acute kidney failure, unspecified: Secondary | ICD-10-CM | POA: Diagnosis not present

## 2023-04-22 LAB — CBC
HCT: 48.4 % (ref 39.0–52.0)
Hemoglobin: 15.1 g/dL (ref 13.0–17.0)
MCH: 34 pg (ref 26.0–34.0)
MCHC: 31.2 g/dL (ref 30.0–36.0)
MCV: 109 fL — ABNORMAL HIGH (ref 80.0–100.0)
Platelets: 528 10*3/uL — ABNORMAL HIGH (ref 150–400)
RBC: 4.44 MIL/uL (ref 4.22–5.81)
RDW: 16.1 % — ABNORMAL HIGH (ref 11.5–15.5)
WBC: 14.7 10*3/uL — ABNORMAL HIGH (ref 4.0–10.5)
nRBC: 0 % (ref 0.0–0.2)

## 2023-04-22 LAB — COMPREHENSIVE METABOLIC PANEL WITH GFR
ALT: 10 U/L (ref 0–44)
AST: 13 U/L — ABNORMAL LOW (ref 15–41)
Albumin: 2.6 g/dL — ABNORMAL LOW (ref 3.5–5.0)
Alkaline Phosphatase: 56 U/L (ref 38–126)
Anion gap: 10 (ref 5–15)
BUN: 44 mg/dL — ABNORMAL HIGH (ref 8–23)
CO2: 23 mmol/L (ref 22–32)
Calcium: 8.9 mg/dL (ref 8.9–10.3)
Chloride: 110 mmol/L (ref 98–111)
Creatinine, Ser: 0.97 mg/dL (ref 0.61–1.24)
GFR, Estimated: >60 mL/min (ref >60)
Glucose, Bld: 189 mg/dL — ABNORMAL HIGH (ref 70–99)
Potassium: 3.6 mmol/L (ref 3.5–5.1)
Sodium: 143 mmol/L (ref 135–145)
Total Bilirubin: 0.9 mg/dL (ref 0.0–1.2)
Total Protein: 6.9 g/dL (ref 6.5–8.1)

## 2023-04-22 LAB — BASIC METABOLIC PANEL WITH GFR
Anion gap: 9 (ref 5–15)
BUN: 45 mg/dL — ABNORMAL HIGH (ref 8–23)
CO2: 22 mmol/L (ref 22–32)
Calcium: 9.4 mg/dL (ref 8.9–10.3)
Chloride: 112 mmol/L — ABNORMAL HIGH (ref 98–111)
Creatinine, Ser: 0.96 mg/dL (ref 0.61–1.24)
GFR, Estimated: >60 mL/min (ref >60)
Glucose, Bld: 113 mg/dL — ABNORMAL HIGH (ref 70–99)
Potassium: 3.8 mmol/L (ref 3.5–5.1)
Sodium: 143 mmol/L (ref 135–145)

## 2023-04-22 LAB — PREALBUMIN: Prealbumin: 10 mg/dL — ABNORMAL LOW (ref 18–38)

## 2023-04-22 LAB — GLUCOSE, CAPILLARY
Glucose-Capillary: 194 mg/dL — ABNORMAL HIGH (ref 70–99)
Glucose-Capillary: 174 mg/dL — ABNORMAL HIGH (ref 70–99)
Glucose-Capillary: 202 mg/dL — ABNORMAL HIGH (ref 70–99)
Glucose-Capillary: 151 mg/dL — ABNORMAL HIGH (ref 70–99)

## 2023-04-22 LAB — MRSA NEXT GEN BY PCR, NASAL: MRSA by PCR Next Gen: NOT DETECTED

## 2023-04-22 LAB — PHOSPHORUS: Phosphorus: 3.1 mg/dL (ref 2.5–4.6)

## 2023-04-22 LAB — MAGNESIUM: Magnesium: 2.1 mg/dL (ref 1.7–2.4)

## 2023-04-22 MED ORDER — MEDIHONEY WOUND/BURN DRESSING EX PSTE
1.0000 | PASTE | Freq: Every day | CUTANEOUS | Status: DC
  Administered 2023-04-22 – 2023-04-25 (×3): 1 via TOPICAL
  Filled 2023-04-22 (×2): qty 44

## 2023-04-22 NOTE — Consult Note (Signed)
 Palliative Care Consult Note                                  Date: 04/22/2023   Patient Name: [name]  DOB: 06-Jul-1932  MRN: [id number]  Age / Sex: 89 y.o., male  PCP: [name] Northern, MD Referring Physician: Leatha Gilding, MD  Reason for Consultation: Establishing goals of care  HPI/Patient Profile: 89 y.o. 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  At baseline he ambulates with a walker, feeds himself and can carry a normal conversation, but had a fall few months ago with known pelvic fracture and has been bedbound since. He was admitted on 04/21/2023 with AKI on CKD 3A, SIRS with likely wound infection as source, acute metabolic encephalopathy, Parkinson's disease, and others.   Palliative medicine consulted for GOC conversations.  Past Medical History:  Diagnosis Date   Chest pain    Coronary artery disease    Diabetes mellitus without complication (HCC)    Hyperlipidemia    Hypertension     Subjective:   This NP [name] reviewed medical records, received report from team, assessed the patient and then meet at the patient's bedside to discuss diagnosis, prognosis, GOC, EOL wishes disposition and options.  I met with the patient at the bedside, although he is asleep and I elected to not wake him per family request.  The patient's son [name] was also at the bedside.  I had a good discussion with [name] and [name].   We meet to discuss diagnosis prognosis, GOC, EOL wishes, disposition and options. Concept of Palliative Care was introduced as specialized medical care for people and their families living with serious illness.  If focuses on providing relief from the symptoms and stress of a serious illness.  The  goal is to improve quality of life for both the patient and the family. Values and goals of care important to patient and family were attempted to be elicited.  Created space and opportunity for patient  and family to explore thoughts and feelings regarding current medical situation   Natural trajectory and current clinical status were discussed. Questions and concerns addressed. Patient  encouraged to call with questions or concerns.    Patient/Family Understanding of Illness: [name] understands that antibiotics will not fix the wound and to have any chance of improvement he would need nutrition and to be able to stand.  Apparently his oral intake has been a bit poor lately.  He has been bedbound since his pelvic fracture was likely significantly limits his ability to be off his wound.  We spent significant time going over clinical details.  Expressed that he has very high risk for reinfection even after antibiotics are completed and also at risk for aspiration pneumonia especially given significant reflux component.  Life Review: The patient has 2 sons who are HCPOA with son [name]'s primary and son [name] is secondary.  He currently lives at [place] facility and was a Insurance underwriter.  Goals: Ongoing discussions, to be determined  Today's Discussion: In addition to discussions described above we had extensive discussion of various topics.  We had a very long talk about feeding tubes.  Apparently previously the patient was very against feeding tubes and now  is more amenable.  We discussed the scope and limitations of feeding tubes.  I explained that feeding tube simply provide calories.  However, there are risks associated with feeding tubes including surgical risk, infection, skin breakdown, diarrhea which could worsen his sacral wound infection.  Additionally, feeding tubes do not eliminate aspiration risk.  This is especially true because the patient has a significant reflux component.  He remains at high  risk for recurrent aspiration pneumonia even with a feeding tube.  At the end all were in agreement that feeding tube would simply be prolonging his current state with slight improvement possible but no significant improvement in the long run.  We discussed other options including hospice care. I described hospice as a service for patients who have a life expectancy of 6 months or less. The goal of hospice is the preservation of dignity and quality at the end phases of life. Under hospice care, the focus changes from curative to symptom relief. I explained the three setting where hospice services can be provided including the home, at a living facility (such as LTC SNF, Assisted Living, etc), and a hospice facility. I explained that acceptance to hospice in any specific location is the final decision of the hospice medical director and bed availability, if applicable. They verbalized understanding.  [name] was clear that if they do elect hospice the patient would want to go back to [facility] facility with hospice in place.  At the end of her conversation show explains that he will sit down and talk to his father.  I have a very good and open relationship and he wants to get his opinion on things.  He will also discuss further with his other family members.  I shared that palliative medicine would follow-up sometime tomorrow and if [name] is not present at the bedside I would call to touch base and see how discussions of gone.  I offered a copy of the book "difficult choices for loving people" and our contact card with contact information for any questions or concerns.  I provided emotional and general support through therapeutic listening, empathy, sharing of stories, and other techniques. I answered all questions and addressed all concerns to the best of my ability.  Review of Systems  Unable to perform ROS   Objective:   Primary Diagnoses: Present on Admission:  SIRS (systemic inflammatory response  syndrome) (HCC)  Acute renal failure (HCC)  Coronary atherosclerosis  Diabetes mellitus type 2 with complications (HCC)  Essential hypertension  Myeloproliferative disorder (HCC)  Parkinsonism (HCC)  Protein-calorie malnutrition, severe (HCC)  Decubitus ulcer  CKD (chronic kidney disease), stage III (HCC)  Hypophosphatemia   Physical Exam Vitals and nursing note reviewed.  Constitutional:      General: He is sleeping. He is not in acute distress. HENT:     Head: Normocephalic and atraumatic.  Pulmonary:     Effort: Pulmonary effort is normal. No respiratory distress.  Abdominal:     General: Abdomen is flat.     Vital Signs:  BP (!) 146/94   Pulse 92   Temp 98.2 F (36.8 C)   Resp 14   Ht 6' (1.829 m)   Wt 72.4 kg   SpO2 97%   BMI 21.65 kg/m   Palliative Assessment/Data: 20-30%%    Advanced Care Planning:   Existing Vynca/ACP Documentation: None  Primary Decision Maker: PATIENT  Code Status/Advance Care Planning: DNR-limited  A discussion was had today regarding advanced directives. Concepts specific to code status, artifical  feeding and hydration, continued IV antibiotics and rehospitalization was had.  The difference between a aggressive medical intervention path and a palliative comfort care path for this patient at this time was had.   Decisions/Changes to ACP: None today  Assessment & Plan:   Impression: 89 year old male with acute presentation chronic comorbidities as described above.  Unfortunately patient here with acute infection likely from his sacral wound since being bedbound after pelvic fracture.  Very limited in ambulatory ability at this point, mostly bedbound which makes treating his wound difficult.  Family is very clear that antibiotics are not a magic fix for this.  We also had a good discussion about feeding tubes and they understand feeding tubes would not provide substantial quality improvement.  Family seems to be considering going  back to [place] facility with hospice services in place.  Family discussions to occur tonight and palliative for follow-up tomorrow.  Overall prognosis poor.  SUMMARY OF RECOMMENDATIONS   DNR-limited Continue to treat the treatable for now Ongoing GOC discussions, considering hospice Continued emotional support patient and family Palliative medicine will follow-up tomorrow  Symptom Management:  Per primary team PMD is available to assist as needed  Prognosis:  < 6 months  Discharge Planning:  To Be Determined   Discussed with: Patient's family, medical team, nursing team    Thank you for allowing us  to participate in the care of Murat A Klugh PMT will continue to support holistically.  Time Total: 63 min  Detailed review of medical records (labs, imaging, vital signs), medically appropriate exam, discussed with treatment team, counseling and education to patient, family, & staff, documenting clinical information, medication management, coordination of care  Signed by: Lizbeth Right, NP Palliative Medicine Team  Team Phone # [phone] (Nights/Weekends)  04/22/2023, 3:05 PM

## 2023-04-22 NOTE — Progress Notes (Signed)
 OT Cancellation Note  Patient Details Name: [name] MRN: [id number] DOB: [birth year]   Cancelled Treatment:    Reason Eval/Treat Not Completed: OT screened, no needs identified, will sign off Patient screened for OT this am. Patient a + 2 assist at LTC with no new Acute OT needs identified. OT sign off. Thank you for this referral.   Ardelia Beau OT/L Acute Rehabilitation Department  [phone]    04/22/2023, 9:02 AM

## 2023-04-22 NOTE — Plan of Care (Signed)
  Problem: Fluid Volume: Goal: Hemodynamic stability will improve Outcome: Progressing   Problem: Clinical Measurements: Goal: Diagnostic test results will improve Outcome: Progressing   Problem: Respiratory: Goal: Ability to maintain adequate ventilation will improve Outcome: Progressing

## 2023-04-22 NOTE — Consult Note (Signed)
 WOC Nurse Consult Note: Reason for Consult: wounds  Wound type: 1. Stage 3 Pressure Injury sacrum/coccyx/buttocks  2.  Deep Tissue Pressure Injury R heel that is evolving  Pressure Injury POA: Yes Measurement: see nursing flowsheet  Wound bed: 1.  50% red moist 50% yellow brown necrotic tissue to sacrum/coccyx/buttocks  2.  Purple maroon discoloration to R heel plantar aspect with one area of full thickness d/t crack in hyperkeratotic skin  Drainage (amount, consistency, odor) see nursing flowsheet  Periwound:  erythema and peeling skin to sacrum/buttocks; heel has hyperkeratotic skin posterior aspect  Dressing procedure/placement/frequency:   Cleanse buttocks/sacrum/coccyx wound with Vashe wound cleanser Timm Foot [phone]), do not rinse and allow to air dry.  Apply Medihoney to entire wound bed daily and cover with dry gauze (make sure to pack any area of depth with dry gauze as well), cover with silicone foam or ABD pad and tape.   Clean R heel with Vashe wound cleanser Timm Foot [phone]), do not rinse and allow to air dry. Cover heel with Xeroform gauze Timm Foot [phone]) daily, cover with dry gauze and wrap with Kerlix roll gauze.  Place R foot into Prevalon boot Timm Foot [phone]).   Patient should remain on a low air loss mattress if moved out of ICU setting.   POC discussed with bedside nurse. WOC team will not follow. Re-consult if further needs arise.   Thank you,    Ronni Colace MSN, RN-BC, [employer] [phone]

## 2023-04-22 NOTE — Progress Notes (Signed)
 Initial Nutrition Assessment  DOCUMENTATION CODES:   Non-severe (moderate) malnutrition in context of chronic illness  INTERVENTION:  - Diet per MD.   - Pending goals of care discussions, would recommend: - Ensure Plus High Protein BID if able to have PO - Daily Rena-vit.  NUTRITION DIAGNOSIS:   Moderate Malnutrition related to chronic illness as evidenced by moderate fat depletion, severe muscle depletion.  GOAL:   Patient will meet greater than or equal to 90% of their needs  MONITOR:   PO intake, Weight trends  REASON FOR ASSESSMENT:   Consult Assessment of nutrition requirement/status  ASSESSMENT:   89 y.o. male with PMH significant of sacral wound, DM2, Parkinson, HTN, Dysphagia, HLD, polyneuropathy, GERD, chronic myeloproliferative disease, and CKD 3a who presented with confusion and CBG up to 582. Admitted for SIRS and acute renal failure.   Patient reports a UBW of 190# and is unsure if he has had any changes in weight recently. Per EMR, no weight history since 2023, when he was weighed at 190#. However, weight taken today at 159# which could suggest weight loss. Unable to verify weight loss time frame.  Patient endorses eating 3 meals a day at home in addition to 1 Ensure/Boost. Reports eating normally until 3 days ago when he began having swallowing difficulty. Shares this happened suddenly however SLP note indicates differently.  Patient had SLP eval today and noted to have ongoing signs of dysphagia. However, per SLP note, patient and son both aware of dysphagia and were reportedly considering a PEG PTA. Plan to hold off on diet changes for now (patient currently on a carb mod) and await recs from pending palliative care consult. Would recommend Ensure to support intake but will await palliative care recs.    Medications reviewed and include: Sinemet, Miralax, Senokot, 100mg  thiamine  Labs reviewed:  - HA1C 6.9 (as of 12/12/22) Blood Glucose 102-576 x24  hours   NUTRITION - FOCUSED PHYSICAL EXAM:  Flowsheet Row Most Recent Value  Orbital Region Mild depletion  Upper Arm Region Mild depletion  Thoracic and Lumbar Region Moderate depletion  Buccal Region Moderate depletion  Temple Region Severe depletion  Clavicle Bone Region Severe depletion  Clavicle and Acromion Bone Region Moderate depletion  Scapular Bone Region Unable to assess  Dorsal Hand Moderate depletion  Patellar Region Mild depletion  Anterior Thigh Region Mild depletion  Posterior Calf Region Mild depletion  Edema (RD Assessment) None  Hair Reviewed  Eyes Reviewed  Mouth Reviewed  Skin Reviewed  Nails Reviewed       Diet Order:   Diet Order             Diet Carb Modified Fluid consistency: Thin; Room service appropriate? Yes  Diet effective now                   EDUCATION NEEDS:  Education needs have been addressed  Skin:  Skin Assessment: Skin Integrity Issues: Skin Integrity Issues:: Stage III, DTI DTI: Left Heel Stage III: Coccyx; Right Heel  Last BM:  PTA  Height:  Ht Readings from Last 1 Encounters:  04/22/23 6' (1.829 m)   Weight:  Wt Readings from Last 1 Encounters:  04/22/23 72.4 kg    BMI:  Body mass index is 21.65 kg/m.  Estimated Nutritional Needs:  Kcal:  1750-1850 kcals Protein:  85-100 grams Fluid:  >/= 1.8L    Shelle Iron RD, LDN Contact via Secure Chat.

## 2023-04-22 NOTE — Progress Notes (Signed)
 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  At baseline he ambulates with a walker, feeds himself and can carry a normal conversation, but had a fall few months ago with known pelvic fracture and has been bedbound since.  Subjective / 24h Interval events: Sleepy this morning but wakes up easily, does not know how he is feeling yet but denies any pain.  Assesement and Plan: Principal Problem:   SIRS (systemic inflammatory response syndrome) (HCC) Active Problems:   Acute renal failure (HCC)   Protein-calorie malnutrition, severe (HCC)   Essential hypertension   Coronary atherosclerosis   Diabetes mellitus type 2 with complications (HCC)   Myeloproliferative disorder (HCC)   Parkinsonism (HCC)   Decubitus ulcer   CKD (chronic kidney disease), stage III (HCC)   Hypophosphatemia   Principal problem Acute kidney injury on CKD 3 A -creatinine ranging normally around 1.1, received IV fluids and is now back to baseline.  Possibly in the setting of dehydration - Appears to have difficulties with p.o. intake, speech to see  Active problems SIRS -with leukocytosis, fever of 100.6, heart rate of 121 on admission.  COVID, flu, RSV, RVP all negative.  CT scan chest abdomen pelvis fairly unremarkable.  He does have a sacral unstageable wound which may be the source.  Has been placed on antibiotics, continue while monitoring cultures.  Afebrile this morning  DM2 with hyperglycemia -elevated sugars in the setting of concern for infection, dehydration.  CBGs now improved, has been placed on sliding scale, continue.  Acute metabolic  encephalopathy -clearing this morning, more appropriate  Parkinson's disease -continue home medications with Sinemet  Elevated lactic acid -in the setting of SIRS, dehydration  Hypophosphatemia -has been replaced and will continue to monitor  Hypokalemia -has been replaced and will continue to monitor  Essential hypertension - continue metoprolol  Scheduled Meds:  carbidopa-levodopa  1 tablet Oral QHS   Chlorhexidine Gluconate Cloth  6 each Topical Daily   insulin aspart  0-15 Units Subcutaneous TID WC   insulin aspart  0-5 Units Subcutaneous QHS   metoprolol tartrate  25 mg Oral BID   pantoprazole  40 mg Oral Q0600   polyethylene glycol  17 g Oral Daily   senna  1 tablet Oral BID   thiamine (VITAMIN B1) injection  100 mg Intravenous Daily   Continuous Infusions:  sodium chloride 100 mL/hr at 04/22/23 0353   sodium chloride 100 mL/hr at 04/22/23 0353   ceFEPime (MAXIPIME) IV Stopped (04/22/23 0022)   metronidazole Stopped (04/22/23 0216)   potassium PHOSPHATE IVPB (in mmol) 43 mL/hr at 04/22/23 0353   vancomycin     PRN Meds:.acetaminophen **OR** acetaminophen, albuterol, dextrose, fentaNYL (SUBLIMAZE) injection, HYDROcodone-acetaminophen, lactulose, ondansetron **OR** ondansetron (ZOFRAN) IV, mouth rinse  Current Outpatient Medications  Medication Instructions   acetaminophen (TYLENOL) 650 mg, Oral, Every 8 hours PRN   ammonium lactate (LAC-HYDRIN) 12 % lotion 1 Application, Nightly, Apply to bilateral legs and feet; do not apply to right heel wound   ASPIRIN 81 PO 81 mg, Oral, Daily   carbidopa-levodopa (SINEMET IR) 25-100 MG tablet 2 tablets, Oral, 3 times daily, At 8am, noon, 5pm   furosemide (LASIX) 40 mg,  Oral, As needed   glimepiride (AMARYL) 4 mg, Oral, Daily with breakfast   hydroxyurea (HYDREA) 500 MG capsule TAKE 1 CAPSULE BY MOUTH ONCE DAILY WITH FOOD   lactulose (CHRONULAC) 20 g, Oral, 2 times daily PRN   Menthol, Topical Analgesic, (BIOFREEZE ROLL-ON) 4 % GEL  1 Application, Apply externally, Every 4 hours PRN, For mild/moderate pain   methocarbamol (ROBAXIN) 500 mg, Oral, Every 6 hours PRN   metoprolol tartrate (LOPRESSOR) 25 mg, Oral, 2 times daily   Multiple Vitamin (MULTIVITAMIN WITH MINERALS) TABS tablet 1 tablet, Oral, Every morning   oxyCODONE (OXY IR/ROXICODONE) 5 mg, Oral, Every 6 hours PRN   pantoprazole (PROTONIX) 40 mg, Oral, Daily   polyethylene glycol (MIRALAX / GLYCOLAX) 17 g, Oral, Daily   PROSOURCE PROTEIN PO 1 Bottle, Oral, Daily, Med Plus 2.0   senna (SENOKOT) 8.6 mg, Oral, 2 times daily   zinc oxide 20 % ointment 1 Application, Topical, 3 times daily PRN, Apply to sacrum/coccyx every shift    Diet Orders (From admission, onward)     Start     Ordered   04/21/23 2322  Diet Carb Modified Fluid consistency: Thin; Room service appropriate? Yes  Diet effective now       Question Answer Comment  Diet-HS Snack? Nothing   Calorie Level Medium 1600-2000   Fluid consistency: Thin   Room service appropriate? Yes      04/21/23 2322            DVT prophylaxis: SCDs Start: 04/21/23 2322   Lab Results  Component Value Date   PLT 690 (H) 04/21/2023      Code Status: Limited: Do not attempt resuscitation (DNR) -DNR-LIMITED -Do Not Intubate/DNI   Family Communication: No family at bedside  Status is: Observation The patient will require care spanning > 2 midnights and should be moved to inpatient because: IV antibiotics, monitoring cultures, ongoing goals of care discussions   Level of care: Stepdown  Consultants:  Palliative care  Objective: Vitals:   04/22/23 0100 04/22/23 0200 04/22/23 0400 04/22/23 0500  BP: 119/66 132/72 (!) 140/71 (!) 107/59  Pulse: 90 71 84 91  Resp: 19 16 14 16   Temp:   (!) 96.6 F (35.9 C)   TempSrc:   Axillary   SpO2: 97% 97% 98% 97%    Intake/Output Summary (Last 24 hours) at 04/22/2023 0611 Last data filed at 04/22/2023 0353 Gross per 24 hour  Intake 2515.85 ml  Output  601 ml  Net 1914.85 ml   Wt Readings from Last 3 Encounters:  10/01/21 86.2 kg  09/20/21 90 kg  02/21/21 90 kg    Examination:  Constitutional: NAD Eyes: no scleral icterus ENMT: Mucous membranes are moist.  Neck: normal, supple Respiratory: clear to auscultation bilaterally, no wheezing, no crackles.  Cardiovascular: Regular rate and rhythm, no murmurs / rubs / gallops.  Abdomen: non distended, no tenderness. Bowel sounds positive.  Musculoskeletal: no clubbing / cyanosis.    Data Reviewed: I have independently reviewed following labs and imaging studies   CBC Recent Labs  Lab 04/21/23 1126  WBC 17.2*  HGB 16.9  HCT 52.1*  PLT 690*  MCV 103.8*  MCH 33.7  MCHC 32.4  RDW 15.9*  LYMPHSABS 0.8  MONOABS 1.1*  EOSABS 0.0  BASOSABS 0.1    Recent Labs  Lab 04/21/23 1116 04/21/23 1126 04/21/23 1356 04/21/23 1840 04/21/23 1942 04/22/23 0100  NA  --  137  --   --  141 143  K  --  4.3  --   --  3.4* 3.8  CL  --  100  --   --  109 112*  CO2  --  20*  --   --  23 22  GLUCOSE  --  604*  --   --  314* 113*  BUN  --  56*  --   --  47* 45*  CREATININE  --  1.42*  --   --  1.15 0.96  CALCIUM  --  9.8  --   --  9.5 9.4  AST  --  14*  --   --   --   --   ALT  --  9  --   --   --   --   ALKPHOS  --  75  --   --   --   --   BILITOT  --  1.3*  --   --   --   --   ALBUMIN  --  3.2*  --   --   --   --   MG  --   --   --   --  2.1  --   PROCALCITON  --   --   --   --  <0.10  --   LATICACIDVEN 2.1*  --  3.2* 3.4*  --   --   INR  --  1.3*  --   --   --   --     ------------------------------------------------------------------------------------------------------------------ No results for input(s): "CHOL", "HDL", "LDLCALC", "TRIG", "CHOLHDL", "LDLDIRECT" in the last 72 hours.  Lab Results  Component Value Date   HGBA1C 6.9 (H) 12/12/2022   ------------------------------------------------------------------------------------------------------------------ No results for  input(s): "TSH", "T4TOTAL", "T3FREE", "THYROIDAB" in the last 72 hours.  Invalid input(s): "FREET3"  Cardiac Enzymes No results for input(s): "CKMB", "TROPONINI", "MYOGLOBIN" in the last 168 hours.  Invalid input(s): "CK" ------------------------------------------------------------------------------------------------------------------ No results found for: "BNP"  CBG: Recent Labs  Lab 04/21/23 1821 04/21/23 1928 04/21/23 2122 04/21/23 2252 04/21/23 2344  GLUCAP 355* 320* 212* 105* 102*    Recent Results (from the past 240 hours)  Blood Culture (routine x 2)     Status: None (Preliminary result)   Collection Time: 04/21/23 11:20 AM   Specimen: BLOOD LEFT FOREARM  Result Value Ref Range Status   Specimen Description   Final    BLOOD LEFT FOREARM Performed at [facility], [address], [place], [place] [address]    Special Requests   Final    BOTTLES DRAWN AEROBIC AND ANAEROBIC Blood Culture adequate volume Performed at [facility], [address], [place], [place] [address]    Culture PENDING  Incomplete   Report Status PENDING  Incomplete  Blood Culture (routine x 2)     Status: None (Preliminary result)   Collection Time: 04/21/23 11:20 AM   Specimen: BLOOD RIGHT FOREARM  Result Value Ref Range Status   Specimen Description   Final    BLOOD RIGHT FOREARM Performed at [facility], [address], [place], [place] [address]    Special Requests   Final    BOTTLES DRAWN AEROBIC AND ANAEROBIC Blood Culture adequate volume Performed at [facility], [address], [place], [place] [address]    Culture PENDING  Incomplete   Report Status PENDING  Incomplete  Resp panel by RT-PCR (RSV, Flu A&B, Covid)     Status: None   Collection Time: 04/21/23 11:25 AM   Specimen: Nasal  Swab  Result Value Ref Range Status   SARS Coronavirus 2 by RT PCR NEGATIVE NEGATIVE Final    Comment: (NOTE) SARS-CoV-2 target nucleic acids  are NOT DETECTED.  The SARS-CoV-2 RNA is generally detectable in upper respiratory specimens during the acute phase of infection. The lowest concentration of SARS-CoV-2 viral copies this assay can detect is 138 copies/mL. A negative result does not preclude SARS-Cov-2 infection and should not be used as the sole basis for treatment or other patient management decisions. A negative result may occur with  improper specimen collection/handling, submission of specimen other than nasopharyngeal swab, presence of viral mutation(s) within the areas targeted by this assay, and inadequate number of viral copies(<138 copies/mL). A negative result must be combined with clinical observations, patient history, and epidemiological information. The expected result is Negative.  Fact Sheet for Patients:  BloggerCourse.com  Fact Sheet for Healthcare Providers:  SeriousBroker.it  This test is no t yet approved or cleared by the [place] FDA and  has been authorized for detection and/or diagnosis of SARS-CoV-2 by FDA under an Emergency Use Authorization (EUA). This EUA will remain  in effect (meaning this test can be used) for the duration of the COVID-19 declaration under Section 564(b)(1) of the Act, 21 U.S.C.section 360bbb-3(b)(1), unless the authorization is terminated  or revoked sooner.       Influenza A by PCR NEGATIVE NEGATIVE Final   Influenza B by PCR NEGATIVE NEGATIVE Final    Comment: (NOTE) The Xpert Xpress SARS-CoV-2/FLU/RSV plus assay is intended as an aid in the diagnosis of influenza from Nasopharyngeal swab specimens and should not be used as a sole basis for treatment. Nasal washings and aspirates are unacceptable for Xpert Xpress SARS-CoV-2/FLU/RSV testing.  Fact Sheet for Patients: BloggerCourse.com  Fact Sheet for Healthcare Providers: SeriousBroker.it  This test is  not yet approved or cleared by the [place] FDA and has been authorized for detection and/or diagnosis of SARS-CoV-2 by FDA under an Emergency Use Authorization (EUA). This EUA will remain in effect (meaning this test can be used) for the duration of the COVID-19 declaration under Section 564(b)(1) of the Act, 21 U.S.C. section 360bbb-3(b)(1), unless the authorization is terminated or revoked.     Resp Syncytial Virus by PCR NEGATIVE NEGATIVE Final    Comment: (NOTE) Fact Sheet for Patients: BloggerCourse.com  Fact Sheet for Healthcare Providers: SeriousBroker.it  This test is not yet approved or cleared by the [place] FDA and has been authorized for detection and/or diagnosis of SARS-CoV-2 by FDA under an Emergency Use Authorization (EUA). This EUA will remain in effect (meaning this test can be used) for the duration of the COVID-19 declaration under Section 564(b)(1) of the Act, 21 U.S.C. section 360bbb-3(b)(1), unless the authorization is terminated or revoked.  Performed at [facility], [address], [place], [place] [address]   Respiratory (~20 pathogens) panel by PCR     Status: None   Collection Time: 04/21/23  7:42 PM   Specimen: Nasopharyngeal Swab; Respiratory  Result Value Ref Range Status   Adenovirus NOT DETECTED NOT DETECTED Final   Coronavirus 229E NOT DETECTED NOT DETECTED Final    Comment: (NOTE) The Coronavirus on the Respiratory Panel, DOES NOT test for the novel  Coronavirus (2019 nCoV)    Coronavirus HKU1 NOT DETECTED NOT DETECTED Final   Coronavirus NL63 NOT DETECTED NOT DETECTED Final   Coronavirus OC43 NOT DETECTED NOT DETECTED Final   Metapneumovirus NOT DETECTED NOT DETECTED Final   Rhinovirus /  Enterovirus NOT DETECTED NOT DETECTED Final   Influenza A NOT DETECTED NOT DETECTED Final   Influenza B NOT DETECTED NOT DETECTED Final   Parainfluenza Virus 1 NOT DETECTED  NOT DETECTED Final   Parainfluenza Virus 2 NOT DETECTED NOT DETECTED Final   Parainfluenza Virus 3 NOT DETECTED NOT DETECTED Final   Parainfluenza Virus 4 NOT DETECTED NOT DETECTED Final   Respiratory Syncytial Virus NOT DETECTED NOT DETECTED Final   Bordetella pertussis NOT DETECTED NOT DETECTED Final   Bordetella Parapertussis NOT DETECTED NOT DETECTED Final   Chlamydophila pneumoniae NOT DETECTED NOT DETECTED Final   Mycoplasma pneumoniae NOT DETECTED NOT DETECTED Final    Comment: Performed at [facility], [address] [place], [place] [address]  MRSA Next Gen by PCR, Nasal     Status: None   Collection Time: 04/21/23 10:33 PM   Specimen: Nasal Mucosa; Nasal Swab  Result Value Ref Range Status   MRSA by PCR Next Gen NOT DETECTED NOT DETECTED Final    Comment: (NOTE) The GeneXpert MRSA Assay (FDA approved for NASAL specimens only), is one component of a comprehensive MRSA colonization surveillance program. It is not intended to diagnose MRSA infection nor to guide or monitor treatment for MRSA infections. Test performance is not FDA approved in patients less than 68 years old. Performed at [facility], [address], [place], [place] [address]      Radiology Studies: CT CHEST ABDOMEN PELVIS W CONTRAST Result Date: 04/21/2023 CLINICAL DATA:  Sepsis EXAM: CT CHEST, ABDOMEN, AND PELVIS WITH CONTRAST TECHNIQUE: Multidetector CT imaging of the chest, abdomen and pelvis was performed following the standard protocol during bolus administration of intravenous contrast. RADIATION DOSE REDUCTION: This exam was performed according to the departmental dose-optimization program which includes automated exposure control, adjustment of the mA and/or kV according to patient size and/or use of iterative reconstruction technique. CONTRAST:  100mL OMNIPAQUE IOHEXOL 300 MG/ML  SOLN COMPARISON:  None Available. FINDINGS: CT CHEST FINDINGS Cardiovascular: Heart is normal size.  Densely calcified coronary arteries. Aneurysmal dilatation of the ascending thoracic aorta measuring 4.5 cm. Calcified and noncalcified irregular plaque throughout the descending thoracic aorta. No dissection. No filling defects in the pulmonary arteries to suggest pulmonary emboli. Mediastinum/Nodes: No mediastinal, hilar, or axillary adenopathy. Trachea and esophagus are unremarkable. Thyroid unremarkable. Lungs/Pleura: Small left pleural effusion. Dependent atelectasis in the lower lobes bilaterally. Musculoskeletal: Chest wall soft tissues are unremarkable. No acute bony abnormality CT ABDOMEN PELVIS FINDINGS Hepatobiliary: Small layering gallstones within the gallbladder. Diffuse low-density throughout the liver compatible with fatty infiltration. No focal hepatic abnormality. Pancreas: No focal abnormality or ductal dilatation. Spleen: Splenomegaly with a craniocaudal length of 17 cm. No focal abnormality. Adrenals/Urinary Tract: No adrenal abnormality. No focal renal abnormality. No stones or hydronephrosis. Urinary bladder is unremarkable. Stomach/Bowel: Large stool burden throughout the colon. Moderate hiatal hernia. No bowel obstruction or inflammatory process. Few scattered sigmoid diverticula. Vascular/Lymphatic: Aortic atherosclerosis. No evidence of aneurysm or adenopathy. Reproductive: No visible focal abnormality. Other: No free fluid or free air. Musculoskeletal: Moderate L2 compression fracture, age indeterminate. Normal alignment. Degenerative disc and facet disease throughout the lumbar spine. IMPRESSION: 4.5 cm ascending thoracic aortic aneurysm. Recommend semi-annual imaging followup by CTA or MRA and referral to cardiothoracic surgery if not already obtained. This recommendation follows 2010 ACCF/AHA/AATS/ACR/ASA/SCA/SCAI/SIR/STS/SVM Guidelines for the Diagnosis and Management of Patients With Thoracic Aortic Disease. Circulation. 2010; 121: Q469-G295. Aortic aneurysm NOS (ICD10-I71.9)  Coronary artery disease, aortic atherosclerosis. Small left pleural effusion.  Bibasilar dependent opacities, likely atelectasis. Moderate hiatal hernia. Moderate stool burden throughout the colon. Splenomegaly. Age-indeterminate moderate L2 compression fracture. Electronically Signed   By: Janeece Mechanic M.D.   On: 04/21/2023 18:25   CT HEAD WO CONTRAST ( ) Result Date: 04/21/2023 CLINICAL DATA:  Mental status change, unknown cause.  Sepsis. EXAM: CT HEAD WITHOUT CONTRAST TECHNIQUE: Contiguous axial images were obtained from the base of the skull through the vertex without intravenous contrast. RADIATION DOSE REDUCTION: This exam was performed according to the departmental dose-optimization program which includes automated exposure control, adjustment of the mA and/or kV according to patient size and/or use of iterative reconstruction technique. COMPARISON:  Head MRI 10/17/2021 FINDINGS: Brain: There is no evidence of an acute infarct, intracranial hemorrhage, mass, midline shift, or extra-axial fluid collection. Periventricular white matter hypodensities are nonspecific but compatible with mild chronic small vessel ischemic disease. Moderate to severe cerebral atrophy is again noted including prominent perisylvian volume loss. Vascular: No hyperdense vessel or unexpected calcification. Skull: No acute fracture or suspicious lesion. Sinuses/Orbits: Visualized paranasal sinuses and mastoid air cells are clear. Bilateral cataract extraction. Other: None. IMPRESSION: 1. No evidence of acute intracranial abnormality. 2. Mild chronic small vessel ischemic disease and moderate to severe cerebral atrophy. Electronically Signed   By: Aundra Lee M.D.   On: 04/21/2023 18:05   DG Chest Port 1 View Result Date: 04/21/2023 CLINICAL DATA:  Questionable sepsis. EXAM: PORTABLE CHEST 1 VIEW COMPARISON:  12/11/2022 FINDINGS: Low volume film. Mild asymmetric elevation right hemidiaphragm. Subtle airspace disease noted left  base, possible pneumonia. No pneumothorax or pleural effusion. No acute bony abnormality. IMPRESSION: Subtle airspace disease left base, possible pneumonia. Electronically Signed   By: Donnal Fusi M.D.   On: 04/21/2023 11:10     Kathlen Para, MD, PhD [facility]  Between 7 am - 7 pm I am available, please contact me via Amion (for emergencies) or Securechat (non urgent messages)  Between 7 pm - 7 am I am not available, please contact night coverage MD/APP via Amion

## 2023-04-22 NOTE — Evaluation (Signed)
 Clinical/Bedside Swallow Evaluation Patient Details  Name: [name] MRN: [id number] Date of Birth: 24-Mar-1932  Today's Date: 04/22/2023 Time: SLP Start Time (ACUTE ONLY): 1028 SLP Stop Time (ACUTE ONLY): 1051 SLP Time Calculation (min) (ACUTE ONLY): 23 min  Past Medical History:  Past Medical History:  Diagnosis Date   Chest pain    Coronary artery disease    Diabetes mellitus without complication (HCC)    Hyperlipidemia    Hypertension    Past Surgical History:  Past Surgical History:  Procedure Laterality Date   CARDIAC CATHETERIZATION  11/01/2005   patent stents with normal LV function   CARDIAC CATHETERIZATION  11/08/2003   cypher stent mid dominant right coronary lesion   CARDIAC SURGERY     CORONARY ANGIOPLASTY     post LAD and RCA stenting   DOPPLER ECHOCARDIOGRAPHY  06/18/2010   EF =>55%,LV normal   EYE SURGERY     holter monitor  11/08/2005   NM MYOCAR PERF WALL MOTION  05/23/2008   EF 62% ,norm myocardial perfusion   HPI:  89 yo presenting 4/14 from SNF with AMS. Admitted with AKI, SIRS. CT Head negative for acute findings; CXR showed subtle airspace disease LLL, possible PNA, although CT more suggestive of atelectasis. It did show a moderate HH. Pt had OP MBS in 03/11/23 that revealed a moderate oropharyngeal dysphagia as well as concern for esophageal dysphagia. There was penetration of thin and nectar thick liquids (reaching the vocal folds with thin liquids), with incomplete clearance despite cues to cough. Silent aspiration occurred x1 with thin liquids while taking barium tablet. There was backflow from the UES back into the pyriform sinuses. Recommendations were to consider primarily providing nectar thick liquids or initiating RMST to strengthen his cough.     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 He has frequent coughing with thin liquids that subsides a little when he is in control of self-feeding and taking small, single sips. It is still noted on >50% of trials, but is initially eliminated with introduction of thickener to make liquids mildly (nectar) thick. Frequent eructation is noted regardless of consistency in pt with known esophageal hx and evidence of backflow into the pharynx on revent MBS. As pt continued to drink mildly thick liquids, he started to have delayed, audible wetness associated with eructation followed by coughing that may be concerning for reduced airway protection post-prandially. Pt was alert but very distractible throughout eval, making it challenging to talk about PLOF and current wishes as he was more focused on his positioning in bed (wanting to lay flat for POs) or having his TV turned on.   Discussed current presentation with MD and palliative care NP and concern for reduced intake and/or reduced airway protection with any PO intake given prandial and post-prandial concerns. Per MD, pt and son are both aware of dysphagia and were considering PEG PTA. MD recommends holding off on any diet changes or additional testing pending palliative care meeting, which may be later today. If offering POs in the interim, would try to carefully follow aspiration and esophageal precautions.   SLP Visit Diagnosis: Dysphagia, unspecified (R13.10)    Aspiration  Risk  Risk for inadequate nutrition/hydration;Moderate aspiration risk    Diet Recommendation  (current diet order per MD)    Medication Administration: Crushed with puree Supervision: Patient able to self feed;Full supervision/cueing for compensatory strategies Compensations: Slow  rate;Small sips/bites;Follow solids with liquid;Hard cough after swallow    Other  Recommendations Oral Care Recommendations: Oral care BID    Recommendations for follow up therapy are one component of a multi-disciplinary discharge planning process, led by the attending physician.  Recommendations may be updated based on patient status, additional functional criteria and insurance authorization.  Follow up Recommendations Skilled nursing-short term rehab (<3 hours/day)      Assistance Recommended at Discharge    Functional Status Assessment Patient has had a recent decline in their functional status and/or demonstrates limited ability to make significant improvements in function in a reasonable and predictable amount of time  Frequency and Duration min 2x/week  2 weeks       Prognosis Prognosis for improved oropharyngeal function: Fair Barriers to Reach Goals: Time post onset      Swallow Study   General HPI: 89 yo presenting 4/14 from SNF with AMS. Admitted with AKI, SIRS. CT Head negative for acute findings; CXR showed subtle airspace disease LLL, possible PNA, although CT more suggestive of atelectasis. It did show a moderate HH. Pt had OP MBS in 03/11/23 that revealed a moderate oropharyngeal dysphagia as well as concern for esophageal dysphagia. There was penetration of thin and nectar thick liquids (reaching the vocal folds with thin liquids), with incomplete clearance despite cues to cough. Silent aspiration occurred x1 with thin liquids while taking barium tablet. There was backflow from the UES back into the pyriform sinuses. Recommendations were to consider primarily providing nectar thick liquids or initiating RMST to strengthen his cough.     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 with positive JAK2 mutation Type of Study: Bedside  Swallow Evaluation Previous Swallow Assessment: see HPI Diet Prior to this Study: Regular;Thin liquids (Level 0) Temperature Spikes Noted: Yes (100.6) Respiratory Status: Room air History of Recent Intubation: No Behavior/Cognition: Alert;Requires cueing Oral Cavity Assessment: Dry Oral Cavity - Dentition: Edentulous;Dentures, not available (upper dentures in room but pt did not want to place them until he had his bottom ones too) Self-Feeding Abilities: Needs assist Patient Positioning: Upright in bed Baseline Vocal Quality: Normal Volitional Cough: Weak Volitional Swallow: Able to elicit    Oral/Motor/Sensory Function Overall Oral Motor/Sensory Function: Within functional limits   Ice Chips Ice chips: Not tested   Thin Liquid Thin Liquid: Impaired Presentation: Cup;Self Fed;Spoon Pharyngeal  Phase Impairments: Cough - Immediate    Nectar Thick Nectar Thick Liquid: Impaired Presentation: Cup;Self Fed Pharyngeal Phase Impairments: Cough - Delayed   Honey Thick Honey Thick Liquid: Not tested   Puree Puree: Within functional limits Presentation: Self Fed;Spoon   Solid     Solid: Not tested (pt refused because he didn't have his lower dentures)      Beth Brooke., M.A. CCC-SLP Acute Rehabilitation Services Office [phone]  Secure chat preferred  04/22/2023,11:13 AM

## 2023-04-22 NOTE — Progress Notes (Signed)
 PT Cancellation Note  Patient Details Name: [name] MRN: [id number] DOB: Jun 06, 1932   Cancelled Treatment:    Reason Eval/Treat Not Completed: PT screened, no needs identified, will sign off Patient  is at [facility], requires lift for OOB PTA. Patient also declined PT today  for an assessment. Recommend return to previous facility. Patient has a  large wound so sitting in recliner would not be to his advantage at this time.  Abelina Hoes PT Acute Rehabilitation Services Office [phone]    Dareen Ebbing 04/22/2023, 11:13 AM

## 2023-04-23 ENCOUNTER — Encounter (HOSPITAL_COMMUNITY): Payer: Self-pay | Admitting: Internal Medicine

## 2023-04-23 DIAGNOSIS — Z7189 Other specified counseling: Secondary | ICD-10-CM | POA: Diagnosis not present

## 2023-04-23 DIAGNOSIS — E440 Moderate protein-calorie malnutrition: Secondary | ICD-10-CM | POA: Insufficient documentation

## 2023-04-23 DIAGNOSIS — N179 Acute kidney failure, unspecified: Secondary | ICD-10-CM | POA: Diagnosis not present

## 2023-04-23 DIAGNOSIS — A419 Sepsis, unspecified organism: Secondary | ICD-10-CM | POA: Diagnosis not present

## 2023-04-23 DIAGNOSIS — R6510 Systemic inflammatory response syndrome (SIRS) of non-infectious origin without acute organ dysfunction: Secondary | ICD-10-CM | POA: Diagnosis not present

## 2023-04-23 DIAGNOSIS — Z515 Encounter for palliative care: Secondary | ICD-10-CM | POA: Diagnosis not present

## 2023-04-23 DIAGNOSIS — Z66 Do not resuscitate: Secondary | ICD-10-CM | POA: Diagnosis not present

## 2023-04-23 LAB — GLUCOSE, CAPILLARY
Glucose-Capillary: 162 mg/dL — ABNORMAL HIGH (ref 70–99)
Glucose-Capillary: 165 mg/dL — ABNORMAL HIGH (ref 70–99)
Glucose-Capillary: 239 mg/dL — ABNORMAL HIGH (ref 70–99)
Glucose-Capillary: 188 mg/dL — ABNORMAL HIGH (ref 70–99)

## 2023-04-23 LAB — COMPREHENSIVE METABOLIC PANEL WITH GFR
ALT: <5 U/L (ref 0–44)
AST: 13 U/L — ABNORMAL LOW (ref 15–41)
Albumin: 2.4 g/dL — ABNORMAL LOW (ref 3.5–5.0)
Alkaline Phosphatase: 52 U/L (ref 38–126)
Anion gap: 7 (ref 5–15)
BUN: 31 mg/dL — ABNORMAL HIGH (ref 8–23)
CO2: 21 mmol/L — ABNORMAL LOW (ref 22–32)
Calcium: 8.8 mg/dL — ABNORMAL LOW (ref 8.9–10.3)
Chloride: 112 mmol/L — ABNORMAL HIGH (ref 98–111)
Creatinine, Ser: 0.84 mg/dL (ref 0.61–1.24)
GFR, Estimated: >60 mL/min (ref >60)
Glucose, Bld: 150 mg/dL — ABNORMAL HIGH (ref 70–99)
Potassium: 3.9 mmol/L (ref 3.5–5.1)
Sodium: 140 mmol/L (ref 135–145)
Total Bilirubin: 1.2 mg/dL (ref 0.0–1.2)
Total Protein: 6.7 g/dL (ref 6.5–8.1)

## 2023-04-23 LAB — MAGNESIUM: Magnesium: 2.1 mg/dL (ref 1.7–2.4)

## 2023-04-23 LAB — CBC
HCT: 47.2 % (ref 39.0–52.0)
Hemoglobin: 14.9 g/dL (ref 13.0–17.0)
MCH: 33.9 pg (ref 26.0–34.0)
MCHC: 31.6 g/dL (ref 30.0–36.0)
MCV: 107.5 fL — ABNORMAL HIGH (ref 80.0–100.0)
Platelets: 418 10*3/uL — ABNORMAL HIGH (ref 150–400)
RBC: 4.39 MIL/uL (ref 4.22–5.81)
RDW: 15.9 % — ABNORMAL HIGH (ref 11.5–15.5)
WBC: 10.4 10*3/uL (ref 4.0–10.5)
nRBC: 0 % (ref 0.0–0.2)

## 2023-04-23 MED ORDER — SODIUM CHLORIDE 0.9 % IV SOLN
2.0000 g | INTRAVENOUS | Status: DC
  Administered 2023-04-23: 2 g via INTRAVENOUS
  Filled 2023-04-23: qty 20

## 2023-04-23 MED ORDER — VANCOMYCIN HCL 500 MG/100ML IV SOLN
500.0000 mg | Freq: Once | INTRAVENOUS | Status: AC
  Administered 2023-04-23: 500 mg via INTRAVENOUS
  Filled 2023-04-23: qty 100

## 2023-04-23 MED ORDER — CARBIDOPA-LEVODOPA 25-100 MG PO TABS
2.0000 | ORAL_TABLET | Freq: Three times a day (TID) | ORAL | Status: DC
  Administered 2023-04-23 – 2023-04-25 (×6): 2 via ORAL
  Filled 2023-04-23 (×6): qty 2

## 2023-04-23 MED ORDER — ENSURE ENLIVE PO LIQD
237.0000 mL | Freq: Two times a day (BID) | ORAL | Status: DC
  Administered 2023-04-23 – 2023-04-25 (×4): 237 mL via ORAL

## 2023-04-23 MED ORDER — VANCOMYCIN HCL 1500 MG/300ML IV SOLN
1500.0000 mg | INTRAVENOUS | Status: DC
  Filled 2023-04-23: qty 300

## 2023-04-23 MED ORDER — MIRTAZAPINE 15 MG PO TABS
7.5000 mg | ORAL_TABLET | Freq: Every day | ORAL | Status: DC
  Administered 2023-04-23: 7.5 mg via ORAL
  Filled 2023-04-23: qty 1

## 2023-04-23 NOTE — Consult Note (Signed)
 Consult Note  [name] January 04, 1933  [id number].    Requesting MD: Dr. Wyonia Hough Chief Complaint/Reason for Consult: Gastrostomy tube placement  HPI:  89 y.o. male with medical history significant for type II DM, hypertension, hyperlipidemia, polyneuropathy, GERD, chronic myeloproliferative disease, CKD 3A, sacral wound, dysphagia, acetabular fracture who presented to [facility] emergency department on 4/14 from his skilled nursing facility, [facility] with confusion.  He was found to have AKI on CKD 3A and SIRS with leukocytosis and fever secondary to his sacral wound.  Due to his acetabular fracture he is mostly bedbound.  Due to his Parkinson's disease he has chronic progressive dysphagia for which IR was consulted for PEG tube.  Due to his anatomy he is not a candidate.  General surgery consulted for surgical gastrostomy tube.  Palliative was consulted and is following  At time of my visit patient's son [name] is present.  Palliative care NP present as well.  Patient tells me he wishes to pursue gastrostomy tube regardless of any potential risks.  Substance use: None currently Allergies: NKDA Blood thinners: None Past Surgeries: No prior abdominal surgeries   ROS: Reviewed and as above  History reviewed. No pertinent family history.  Past Medical History:  Diagnosis Date   Chest pain    Coronary artery disease    Diabetes mellitus without complication (HCC)    Hyperlipidemia    Hypertension     Past Surgical History:  Procedure Laterality Date   CARDIAC CATHETERIZATION  11/01/2005   patent stents with normal LV function   CARDIAC CATHETERIZATION  11/08/2003   cypher stent mid dominant right coronary lesion   CARDIAC SURGERY     CORONARY ANGIOPLASTY     post LAD and RCA stenting   DOPPLER ECHOCARDIOGRAPHY  06/18/2010   EF =>55%,LV normal   EYE SURGERY     holter monitor  11/08/2005   NM MYOCAR PERF WALL MOTION  05/23/2008   EF 62% ,norm myocardial  perfusion    Social History:  reports that he has quit smoking. His smoking use included cigarettes. He quit smokeless tobacco use about 40 years ago. He reports current alcohol use. He reports that he does not use drugs.  Allergies: No Known Allergies  Medications Prior to Admission  Medication Sig Dispense Refill   acetaminophen (TYLENOL) 650 MG CR tablet Take 650 mg by mouth every 8 (eight) hours as needed for pain.     ammonium lactate (LAC-HYDRIN) 12 % lotion 1 Application at bedtime. Apply to bilateral legs and feet; do not apply to right heel wound     aspirin EC 81 MG tablet Take 81 mg by mouth daily. Swallow whole.     carbidopa-levodopa (SINEMET IR) 25-100 MG tablet Take 2 tablets by mouth 3 (three) times daily. At 8am, noon, 5pm (Patient taking differently: Take 2 tablets by mouth 3 (three) times daily.) 180 tablet 11   furosemide (LASIX) 40 MG tablet Take 1 tablet (40 mg total) by mouth as needed for edema.     hydroxyurea (HYDREA) 500 MG capsule TAKE 1 CAPSULE BY MOUTH ONCE DAILY WITH FOOD (Patient taking differently: Take 500 mg by mouth 2 (two) times daily. For thrombocytosis) 90 capsule 0   lactulose (CHRONULAC) 10 GM/15ML solution Take 30 mLs (20 g total) by mouth 2 (two) times daily as needed for moderate constipation or severe constipation.     magnesium hydroxide (MILK OF MAGNESIA) 400 MG/5ML suspension Take 30 mLs by mouth daily as needed  for mild constipation (If no BM in 3 days).     Menthol, Topical Analgesic, (BIOFREEZE ROLL-ON) 4 % GEL Apply 1 Application topically every 4 (four) hours as needed. For mild/moderate pain     methocarbamol (ROBAXIN) 500 MG tablet Take 1 tablet (500 mg total) by mouth every 6 (six) hours as needed for muscle spasms.     metoprolol tartrate (LOPRESSOR) 50 MG tablet Take 25 mg by mouth 2 (two) times daily.     mirtazapine (REMERON) 7.5 MG tablet Take 7.5 mg by mouth at bedtime.     Multiple Vitamin (MULTIVITAMIN WITH MINERALS) TABS tablet  Take 1 tablet by mouth every morning.     pantoprazole (PROTONIX) 40 MG tablet Take 1 tablet (40 mg total) by mouth daily at 6 (six) AM.     polyethylene glycol (MIRALAX / GLYCOLAX) 17 g packet Take 17 g by mouth daily.     PROSOURCE PROTEIN PO Take 1 Bottle by mouth daily. Med Plus 2.0     senna (SENOKOT) 8.6 MG TABS tablet Take 1 tablet (8.6 mg total) by mouth 2 (two) times daily.     zinc oxide 20 % ointment Apply 1 Application topically 3 (three) times daily as needed for irritation. Apply to sacrum/coccyx every shift      Blood pressure 117/75, pulse 89, temperature 97.7 F (36.5 C), temperature source Oral, resp. rate 18, height 6' (1.829 m), weight 72.4 kg, SpO2 95%. Physical Exam: General: pleasant, WD, male who is laying in bed in NAD HEENT: head is normocephalic, atraumatic.  Sclera are noninjected.  Pupils equal and round. EOMs intact.  Ears and nose without any masses or lesions.  Mouth is pink and moist Lungs: Respiratory effort nonlabored on room air Abd: soft, NT, ND MSK: all 4 extremities are symmetrical with no cyanosis, clubbing, or edema. Skin: warm and dry with no masses, lesions, or rashes Neuro: Cranial nerves 2-12 grossly intact, sensation is normal throughout Psych: A&Ox3 with an appropriate affect.    Results for orders placed or performed during the hospital encounter of 04/21/23 (from the past 48 hours)  CBG monitoring, ED     Status: Abnormal   Collection Time: 04/21/23  4:04 PM  Result Value Ref Range   Glucose-Capillary 417 (H) 70 - 99 mg/dL    Comment: Glucose reference range applies only to samples taken after fasting for at least 8 hours.  CBG monitoring, ED     Status: Abnormal   Collection Time: 04/21/23  4:44 PM  Result Value Ref Range   Glucose-Capillary 416 (H) 70 - 99 mg/dL    Comment: Glucose reference range applies only to samples taken after fasting for at least 8 hours.  CBG monitoring, ED     Status: Abnormal   Collection Time:  04/21/23  5:24 PM  Result Value Ref Range   Glucose-Capillary 338 (H) 70 - 99 mg/dL    Comment: Glucose reference range applies only to samples taken after fasting for at least 8 hours.  CBG monitoring, ED     Status: Abnormal   Collection Time: 04/21/23  6:21 PM  Result Value Ref Range   Glucose-Capillary 355 (H) 70 - 99 mg/dL    Comment: Glucose reference range applies only to samples taken after fasting for at least 8 hours.  Lactic acid, plasma     Status: Abnormal   Collection Time: 04/21/23  6:40 PM  Result Value Ref Range   Lactic Acid, Venous 3.4 (HH) 0.5 - 1.9  mmol/L    Comment: CRITICAL RESULT CALLED TO, READ BACK BY AND VERIFIED WITH Donnald Garre, RN 04/21/23 1953 J. COLE Performed at [facility], [address], [place], [place] [address]   CBG monitoring, ED     Status: Abnormal   Collection Time: 04/21/23  7:28 PM  Result Value Ref Range   Glucose-Capillary 320 (H) 70 - 99 mg/dL    Comment: Glucose reference range applies only to samples taken after fasting for at least 8 hours.  APTT     Status: None   Collection Time: 04/21/23  7:42 PM  Result Value Ref Range   aPTT 32 24 - 36 seconds    Comment: Performed at [facility], [address], [place], [place] [address]  Procalcitonin     Status: None   Collection Time: 04/21/23  7:42 PM  Result Value Ref Range   Procalcitonin <0.10 ng/mL    Comment:        Interpretation: PCT (Procalcitonin) <= 0.5 ng/mL: Systemic infection (sepsis) is not likely. Local bacterial infection is possible. (NOTE)       Sepsis PCT Algorithm           Lower Respiratory Tract                                      Infection PCT Algorithm    ----------------------------     ----------------------------         PCT < 0.25 ng/mL                PCT < 0.10 ng/mL          Strongly encourage             Strongly discourage   discontinuation of antibiotics    initiation of antibiotics     ----------------------------     -----------------------------       PCT 0.25 - 0.50 ng/mL            PCT 0.10 - 0.25 ng/mL               OR       >80% decrease in PCT            Discourage initiation of                                            antibiotics      Encourage discontinuation           of antibiotics    ----------------------------     -----------------------------         PCT >= 0.50 ng/mL              PCT 0.26 - 0.50 ng/mL               AND        <80% decrease in PCT             Encourage initiation of                                             antibiotics       Encourage continuation  of antibiotics    ----------------------------     -----------------------------        PCT >= 0.50 ng/mL                  PCT > 0.50 ng/mL               AND         increase in PCT                  Strongly encourage                                      initiation of antibiotics    Strongly encourage escalation           of antibiotics                                     -----------------------------                                           PCT <= 0.25 ng/mL                                                 OR                                        > 80% decrease in PCT                                      Discontinue / Do not initiate                                             antibiotics  Performed at [facility], [address] [place], [place] [address]   CK     Status: None   Collection Time: 04/21/23  7:42 PM  Result Value Ref Range   Total CK 53 49 - 397 U/L    Comment: Performed at [facility], [address] [place], [place] [address]  Magnesium     Status: None   Collection Time: 04/21/23  7:42 PM  Result Value Ref Range   Magnesium 2.1 1.7 - 2.4 mg/dL    Comment: Performed at [facility], [address] [place], [place] [address]  Phosphorus     Status: Abnormal   Collection Time: 04/21/23   7:42 PM  Result Value Ref Range   Phosphorus 1.5 (L) 2.5 - 4.6 mg/dL    Comment: Performed at [facility], [address] [place], [place] [address]  Troponin I (High Sensitivity)     Status: None   Collection Time: 04/21/23  7:42 PM  Result Value Ref Range   Troponin I (High Sensitivity) 17 <18 ng/L  Comment: (NOTE) Elevated high sensitivity troponin I (hsTnI) values and significant  changes across serial measurements may suggest ACS but many other  chronic and acute conditions are known to elevate hsTnI results.  Refer to the "Links" section for chest pain algorithms and additional  guidance. Performed at [facility], [address] [place], [place] [address]   Troponin I (High Sensitivity)     Status: None   Collection Time: 04/21/23  7:42 PM  Result Value Ref Range   Troponin I (High Sensitivity) 17 <18 ng/L    Comment: (NOTE) Elevated high sensitivity troponin I (hsTnI) values and significant  changes across serial measurements may suggest ACS but many other  chronic and acute conditions are known to elevate hsTnI results.  Refer to the "Links" section for chest pain algorithms and additional  guidance. Performed at [facility], [address], [place], [place] [address]   Respiratory (~20 pathogens) panel by PCR     Status: None   Collection Time: 04/21/23  7:42 PM   Specimen: Nasopharyngeal Swab; Respiratory  Result Value Ref Range   Adenovirus NOT DETECTED NOT DETECTED   Coronavirus 229E NOT DETECTED NOT DETECTED    Comment: (NOTE) The Coronavirus on the Respiratory Panel, DOES NOT test for the novel  Coronavirus (2019 nCoV)    Coronavirus HKU1 NOT DETECTED NOT DETECTED   Coronavirus NL63 NOT DETECTED NOT DETECTED   Coronavirus OC43 NOT DETECTED NOT DETECTED   Metapneumovirus NOT DETECTED NOT DETECTED   Rhinovirus / Enterovirus NOT DETECTED NOT DETECTED   Influenza A NOT DETECTED NOT DETECTED    Influenza B NOT DETECTED NOT DETECTED   Parainfluenza Virus 1 NOT DETECTED NOT DETECTED   Parainfluenza Virus 2 NOT DETECTED NOT DETECTED   Parainfluenza Virus 3 NOT DETECTED NOT DETECTED   Parainfluenza Virus 4 NOT DETECTED NOT DETECTED   Respiratory Syncytial Virus NOT DETECTED NOT DETECTED   Bordetella pertussis NOT DETECTED NOT DETECTED   Bordetella Parapertussis NOT DETECTED NOT DETECTED   Chlamydophila pneumoniae NOT DETECTED NOT DETECTED   Mycoplasma pneumoniae NOT DETECTED NOT DETECTED    Comment: Performed at [facility], [address], [place], [place] [address]  Basic metabolic panel with GFR     Status: Abnormal   Collection Time: 04/21/23  7:42 PM  Result Value Ref Range   Sodium 141 135 - 145 mmol/L   Potassium 3.4 (L) 3.5 - 5.1 mmol/L   Chloride 109 98 - 111 mmol/L   CO2 23 22 - 32 mmol/L   Glucose, Bld 314 (H) 70 - 99 mg/dL    Comment: Glucose reference range applies only to samples taken after fasting for at least 8 hours.   BUN 47 (H) 8 - 23 mg/dL   Creatinine, Ser 6.64 0.61 - 1.24 mg/dL   Calcium 9.5 8.9 - 10.3 mg/dL   GFR, Estimated >40 >34 mL/min    Comment: (NOTE) Calculated using the CKD-EPI Creatinine Equation (2021)    Anion gap 9 5 - 15    Comment: Performed at [facility], [address], [place], [place] [address]  CBG monitoring, ED     Status: Abnormal   Collection Time: 04/21/23  9:22 PM  Result Value Ref Range   Glucose-Capillary 212 (H) 70 - 99 mg/dL    Comment: Glucose reference range applies only to samples taken after fasting for at least 8 hours.  MRSA Next Gen by PCR, Nasal     Status: None  Collection Time: 04/21/23 10:33 PM   Specimen: Nasal Mucosa; Nasal Swab  Result Value Ref Range   MRSA by PCR Next Gen NOT DETECTED NOT DETECTED    Comment: (NOTE) The GeneXpert MRSA Assay (FDA approved for NASAL specimens only), is one component of a comprehensive MRSA colonization surveillance program. It is not  intended to diagnose MRSA infection nor to guide or monitor treatment for MRSA infections. Test performance is not FDA approved in patients less than 79 years old. Performed at [facility], [address] [place], [place] [address]   Glucose, capillary     Status: Abnormal   Collection Time: 04/21/23 10:52 PM  Result Value Ref Range   Glucose-Capillary 105 (H) 70 - 99 mg/dL    Comment: Glucose reference range applies only to samples taken after fasting for at least 8 hours.  Troponin I (High Sensitivity)     Status: None   Collection Time: 04/21/23 11:01 PM  Result Value Ref Range   Troponin I (High Sensitivity) 13 <18 ng/L    Comment: (NOTE) Elevated high sensitivity troponin I (hsTnI) values and significant  changes across serial measurements may suggest ACS but many other  chronic and acute conditions are known to elevate hsTnI results.  Refer to the "Links" section for chest pain algorithms and additional  guidance. Performed at [facility], [address] [place], [place] [address]   Glucose, capillary     Status: Abnormal   Collection Time: 04/21/23 11:44 PM  Result Value Ref Range   Glucose-Capillary 102 (H) 70 - 99 mg/dL    Comment: Glucose reference range applies only to samples taken after fasting for at least 8 hours.  Basic metabolic panel     Status: Abnormal   Collection Time: 04/22/23  1:00 AM  Result Value Ref Range   Sodium 143 135 - 145 mmol/L   Potassium 3.8 3.5 - 5.1 mmol/L   Chloride 112 (H) 98 - 111 mmol/L   CO2 22 22 - 32 mmol/L   Glucose, Bld 113 (H) 70 - 99 mg/dL    Comment: Glucose reference range applies only to samples taken after fasting for at least 8 hours.   BUN 45 (H) 8 - 23 mg/dL   Creatinine, Ser 1.47 0.61 - 1.24 mg/dL   Calcium 9.4 8.9 - 82.9 mg/dL   GFR, Estimated >56 >21 mL/min    Comment: (NOTE) Calculated using the CKD-EPI Creatinine Equation (2021)    Anion gap 9 5 - 15    Comment:  Performed at [facility], [address], [place], [place] [address]  Prealbumin     Status: Abnormal   Collection Time: 04/22/23  6:05 AM  Result Value Ref Range   Prealbumin 10 (L) 18 - 38 mg/dL    Comment: Performed at [facility], [address], [place], [place] [address]  Magnesium     Status: None   Collection Time: 04/22/23  6:05 AM  Result Value Ref Range   Magnesium 2.1 1.7 - 2.4 mg/dL    Comment: Performed at [facility], [address] [place], [place] [address]  Phosphorus     Status: None   Collection Time: 04/22/23  6:05 AM  Result Value Ref Range   Phosphorus 3.1 2.5 - 4.6 mg/dL    Comment: Performed at [facility], [address], [place], [place] [address]  Comprehensive metabolic panel     Status: Abnormal  Collection Time: 04/22/23  6:05 AM  Result Value Ref Range   Sodium 143 135 - 145 mmol/L   Potassium 3.6 3.5 - 5.1 mmol/L   Chloride 110 98 - 111 mmol/L   CO2 23 22 - 32 mmol/L   Glucose, Bld 189 (H) 70 - 99 mg/dL    Comment: Glucose reference range applies only to samples taken after fasting for at least 8 hours.   BUN 44 (H) 8 - 23 mg/dL   Creatinine, Ser 2.44 0.61 - 1.24 mg/dL   Calcium 8.9 8.9 - 01.0 mg/dL   Total Protein 6.9 6.5 - 8.1 g/dL   Albumin 2.6 (L) 3.5 - 5.0 g/dL   AST 13 (L) 15 - 41 U/L   ALT 10 0 - 44 U/L   Alkaline Phosphatase 56 38 - 126 U/L   Total Bilirubin 0.9 0.0 - 1.2 mg/dL   GFR, Estimated >27 >25 mL/min    Comment: (NOTE) Calculated using the CKD-EPI Creatinine Equation (2021)    Anion gap 10 5 - 15    Comment: Performed at [facility], [address], [place], [place] [address]  CBC     Status: Abnormal   Collection Time: 04/22/23  6:05 AM  Result Value Ref Range   WBC 14.7 (H) 4.0 - 10.5 K/uL   RBC 4.44 4.22 - 5.81 MIL/uL   Hemoglobin 15.1 13.0 - 17.0 g/dL   HCT 03.4 74.2 - 59.5 %   MCV 109.0 (H) 80.0 - 100.0 fL   MCH 34.0 26.0 -  34.0 pg   MCHC 31.2 30.0 - 36.0 g/dL   RDW 63.8 (H) 75.6 - 43.3 %   Platelets 528 (H) 150 - 400 K/uL   nRBC 0.0 0.0 - 0.2 %    Comment: Performed at [facility], [address] [place], [place] [address]  Glucose, capillary     Status: Abnormal   Collection Time: 04/22/23  7:52 AM  Result Value Ref Range   Glucose-Capillary 194 (H) 70 - 99 mg/dL    Comment: Glucose reference range applies only to samples taken after fasting for at least 8 hours.  Glucose, capillary     Status: Abnormal   Collection Time: 04/22/23 12:04 PM  Result Value Ref Range   Glucose-Capillary 174 (H) 70 - 99 mg/dL    Comment: Glucose reference range applies only to samples taken after fasting for at least 8 hours.  Glucose, capillary     Status: Abnormal   Collection Time: 04/22/23  4:38 PM  Result Value Ref Range   Glucose-Capillary 202 (H) 70 - 99 mg/dL    Comment: Glucose reference range applies only to samples taken after fasting for at least 8 hours.  Glucose, capillary     Status: Abnormal   Collection Time: 04/22/23  9:09 PM  Result Value Ref Range   Glucose-Capillary 151 (H) 70 - 99 mg/dL    Comment: Glucose reference range applies only to samples taken after fasting for at least 8 hours.  Comprehensive metabolic panel with GFR     Status: Abnormal   Collection Time: 04/23/23  3:50 AM  Result Value Ref Range   Sodium 140 135 - 145 mmol/L   Potassium 3.9 3.5 - 5.1 mmol/L   Chloride 112 (H) 98 - 111 mmol/L   CO2 21 (L) 22 - 32 mmol/L   Glucose, Bld 150 (H) 70 - 99 mg/dL    Comment: Glucose reference range applies only to samples taken after  fasting for at least 8 hours.   BUN 31 (H) 8 - 23 mg/dL   Creatinine, Ser 9.14 0.61 - 1.24 mg/dL   Calcium 8.8 (L) 8.9 - 10.3 mg/dL   Total Protein 6.7 6.5 - 8.1 g/dL   Albumin 2.4 (L) 3.5 - 5.0 g/dL   AST 13 (L) 15 - 41 U/L   ALT <5 0 - 44 U/L   Alkaline Phosphatase 52 38 - 126 U/L   Total Bilirubin 1.2 0.0 - 1.2 mg/dL   GFR, Estimated  >78 >29 mL/min    Comment: (NOTE) Calculated using the CKD-EPI Creatinine Equation (2021)    Anion gap 7 5 - 15    Comment: Performed at [facility], [address] [place], [place] [address]  CBC     Status: Abnormal   Collection Time: 04/23/23  3:50 AM  Result Value Ref Range   WBC 10.4 4.0 - 10.5 K/uL   RBC 4.39 4.22 - 5.81 MIL/uL   Hemoglobin 14.9 13.0 - 17.0 g/dL   HCT 08.6 57.8 - 46.9 %   MCV 107.5 (H) 80.0 - 100.0 fL   MCH 33.9 26.0 - 34.0 pg   MCHC 31.6 30.0 - 36.0 g/dL   RDW 62.9 (H) 52.8 - 41.3 %   Platelets 418 (H) 150 - 400 K/uL   nRBC 0.0 0.0 - 0.2 %    Comment: Performed at [facility], [address], [place], [place] [address]  Magnesium     Status: None   Collection Time: 04/23/23  3:50 AM  Result Value Ref Range   Magnesium 2.1 1.7 - 2.4 mg/dL    Comment: Performed at [facility], [address] [place], [place] [address]  Glucose, capillary     Status: Abnormal   Collection Time: 04/23/23  7:39 AM  Result Value Ref Range   Glucose-Capillary 162 (H) 70 - 99 mg/dL    Comment: Glucose reference range applies only to samples taken after fasting for at least 8 hours.  Glucose, capillary     Status: Abnormal   Collection Time: 04/23/23 12:04 PM  Result Value Ref Range   Glucose-Capillary 165 (H) 70 - 99 mg/dL    Comment: Glucose reference range applies only to samples taken after fasting for at least 8 hours.   CT CHEST ABDOMEN PELVIS W CONTRAST Result Date: 04/21/2023 CLINICAL DATA:  Sepsis EXAM: CT CHEST, ABDOMEN, AND PELVIS WITH CONTRAST TECHNIQUE: Multidetector CT imaging of the chest, abdomen and pelvis was performed following the standard protocol during bolus administration of intravenous contrast. RADIATION DOSE REDUCTION: This exam was performed according to the departmental dose-optimization program which includes automated exposure control, adjustment of the mA and/or kV according to patient  size and/or use of iterative reconstruction technique. CONTRAST:  100mL OMNIPAQUE IOHEXOL 300 MG/ML  SOLN COMPARISON:  None Available. FINDINGS: CT CHEST FINDINGS Cardiovascular: Heart is normal size. Densely calcified coronary arteries. Aneurysmal dilatation of the ascending thoracic aorta measuring 4.5 cm. Calcified and noncalcified irregular plaque throughout the descending thoracic aorta. No dissection. No filling defects in the pulmonary arteries to suggest pulmonary emboli. Mediastinum/Nodes: No mediastinal, hilar, or axillary adenopathy. Trachea and esophagus are unremarkable. Thyroid unremarkable. Lungs/Pleura: Small left pleural effusion. Dependent atelectasis in the lower lobes bilaterally. Musculoskeletal: Chest wall soft tissues are unremarkable. No acute bony abnormality CT ABDOMEN PELVIS FINDINGS Hepatobiliary: Small layering gallstones within the gallbladder. Diffuse low-density throughout the liver compatible with fatty infiltration. No focal hepatic abnormality. Pancreas: No  focal abnormality or ductal dilatation. Spleen: Splenomegaly with a craniocaudal length of 17 cm. No focal abnormality. Adrenals/Urinary Tract: No adrenal abnormality. No focal renal abnormality. No stones or hydronephrosis. Urinary bladder is unremarkable. Stomach/Bowel: Large stool burden throughout the colon. Moderate hiatal hernia. No bowel obstruction or inflammatory process. Few scattered sigmoid diverticula. Vascular/Lymphatic: Aortic atherosclerosis. No evidence of aneurysm or adenopathy. Reproductive: No visible focal abnormality. Other: No free fluid or free air. Musculoskeletal: Moderate L2 compression fracture, age indeterminate. Normal alignment. Degenerative disc and facet disease throughout the lumbar spine. IMPRESSION: 4.5 cm ascending thoracic aortic aneurysm. Recommend semi-annual imaging followup by CTA or MRA and referral to cardiothoracic surgery if not already obtained. This recommendation follows 2010  ACCF/AHA/AATS/ACR/ASA/SCA/SCAI/SIR/STS/SVM Guidelines for the Diagnosis and Management of Patients With Thoracic Aortic Disease. Circulation. 2010; 121: K440-N027. Aortic aneurysm NOS (ICD10-I71.9) Coronary artery disease, aortic atherosclerosis. Small left pleural effusion. Bibasilar dependent opacities, likely atelectasis. Moderate hiatal hernia. Moderate stool burden throughout the colon. Splenomegaly. Age-indeterminate moderate L2 compression fracture. Electronically Signed   By: Janeece Mechanic M.D.   On: 04/21/2023 18:25   CT HEAD WO CONTRAST ( ) Result Date: 04/21/2023 CLINICAL DATA:  Mental status change, unknown cause.  Sepsis. EXAM: CT HEAD WITHOUT CONTRAST TECHNIQUE: Contiguous axial images were obtained from the base of the skull through the vertex without intravenous contrast. RADIATION DOSE REDUCTION: This exam was performed according to the departmental dose-optimization program which includes automated exposure control, adjustment of the mA and/or kV according to patient size and/or use of iterative reconstruction technique. COMPARISON:  Head MRI 10/17/2021 FINDINGS: Brain: There is no evidence of an acute infarct, intracranial hemorrhage, mass, midline shift, or extra-axial fluid collection. Periventricular white matter hypodensities are nonspecific but compatible with mild chronic small vessel ischemic disease. Moderate to severe cerebral atrophy is again noted including prominent perisylvian volume loss. Vascular: No hyperdense vessel or unexpected calcification. Skull: No acute fracture or suspicious lesion. Sinuses/Orbits: Visualized paranasal sinuses and mastoid air cells are clear. Bilateral cataract extraction. Other: None. IMPRESSION: 1. No evidence of acute intracranial abnormality. 2. Mild chronic small vessel ischemic disease and moderate to severe cerebral atrophy. Electronically Signed   By: Aundra Lee M.D.   On: 04/21/2023 18:05      Assessment/Plan Gastrostomy tube  placement Dysphagia  Patient seen and examined and relevant labs and imaging personally reviewed.  Patient care and treatment plan discussed with primary team and palliative.  He has a moderate hiatal hernia and is not a candidate for PEG tube placement and would require laparoscopic versus open gastrostomy tube placement and hiatal hernia repair.  Discussed at length with patient and his son [name] risk/benefits of potential procedural options.. I discussed the indications, risks and aftercare of gastrostomy tube placement. Risks include but are not limited to anesthesia (MI, CVA, death, aspiration), bleeding, infection, scarring, pain, leakage around the tube, skin irritation, tube dislodgement and wound issues. We discussed the gastrostomy tube will need to stay in place for at least 6 weeks and the rationale for this.  We discussed that a G-tube may not ultimately improve quality or prolong life.  He voiced understanding and acknowledged the potential risks as well as desire to proceed with gastrostomy tube placement.  Will review with MD.  Ortencia Blamer for diet today with n.p.o. midnight.  FEN: Carb, n.p.o. midnight ID: Ceftriaxone, metronidazole, vancomycin VTE: okay for chemical prophylaxis from surgical standpoint   I reviewed ED provider notes, Consultant Palliative Care notes, hospitalist notes, last 24 h vitals and pain scores,  last 48 h intake and output, last 24 h labs and trends, and last 24 h imaging results.   Elwin Hammond, [facility] 04/23/2023, 3:17 PM Please see Amion for pager number during day hours 7:00am-4:30pm

## 2023-04-23 NOTE — Plan of Care (Signed)
°  Problem: Fluid Volume: °Goal: Hemodynamic stability will improve °Outcome: Progressing °  °Problem: Clinical Measurements: °Goal: Diagnostic test results will improve °Outcome: Progressing °Goal: Signs and symptoms of infection will decrease °Outcome: Progressing °  °

## 2023-04-23 NOTE — Progress Notes (Signed)
 Patient ID: [name], male   DOB: 1932-10-01, 89 y.o.   MRN: [id number] Request received for percutaneous gastrostomy tube placement in pt. Latest imaging was reviewed by Dr. Darylene Epley and pt not a candidate for tube placement percutaneously due to anatomy.

## 2023-04-23 NOTE — Progress Notes (Signed)
 Pharmacy Antibiotic Note  [name] is a 89 y.o. male admitted on 04/21/2023 with sepsis.  Pharmacy has been consulted for vancomycin and cefepime dosing.  Today, 04/23/23 Cefepime changed to ceftriaxone. Pharmacy remains consulted to dose vancomycin.  Scr improved to 0.84 and vancomycin AUC re-calculated   Plan: Continue ceftriaxone and metronidazole per provider.  Vancomycin 1500 mg IV q24h for estimated AUC of 513.2 (1g already given today, give 500 mg later today to equal new total daily dose of 1500 mg).  Goal AUC 400-550. Check levels as needed Monitor renal function, clinical progress, and cultures.  Height: 6' (182.9 cm) Weight: 72.4 kg (159 lb 9.8 oz) IBW/kg (Calculated) : 77.6  Temp (24hrs), Avg:97.8 F (36.6 C), Min:97.6 F (36.4 C), Max:98.2 F (36.8 C)  Recent Labs  Lab 04/21/23 1116 04/21/23 1126 04/21/23 1356 04/21/23 1840 04/21/23 1942 04/22/23 0100 04/22/23 0605 04/23/23 0350  WBC  --  17.2*  --   --   --   --  14.7* 10.4  CREATININE  --  1.42*  --   --  1.15 0.96 0.97 0.84  LATICACIDVEN 2.1*  --  3.2* 3.4*  --   --   --   --     Estimated Creatinine Clearance: 62.2 mL/min (by C-G formula based on SCr of 0.84 mg/dL).    No Known Allergies  Antimicrobials this admission: Cefepime 4/14 >> 4/16 Vancomycin 4/14 >>  Ceftriaxone 4/16 >> Metronidazole 4/14 >>  Thank you for allowing pharmacy to be a part of this patient's care.   Skeet Duke, [facility] PharmD Candidate 04/23/2023 1:07 PM

## 2023-04-23 NOTE — Progress Notes (Signed)
 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  At baseline he ambulates with a walker, feeds himself and can carry a normal conversation, but had a fall few months ago with known pelvic fracture and has been bedbound since.  Subjective / 24h Interval events: He is doing well today, denies any chest pain.  His back is bothering him  Assesement and Plan: Principal Problem:   SIRS (systemic inflammatory response syndrome) (HCC) Active Problems:   Acute renal failure (HCC)   Protein-calorie malnutrition, severe (HCC)   Essential hypertension   Coronary atherosclerosis   Diabetes mellitus type 2 with complications (HCC)   Myeloproliferative disorder (HCC)   Parkinsonism (HCC)   Decubitus ulcer   CKD (chronic kidney disease), stage III (HCC)   Hypophosphatemia   Malnutrition of moderate degree   Goals of care discussions -have discussed extensively with the patient and the patient's son at bedside today.  There have been conversations even prior to being hospitalized about the feeding tube and patient has expressed his wishes that he wants a feeding tube.  I detailed to the patient his medical conditions, recent pelvic fracture and immobility resulting in sacral decubitus ulcer which is potentially infected.  I discussed about feeding tube will give him calories and will not prevent further aspirations, nor help too much with healing the sacral wound unless he is up and walking.  If he undergoes a feeding tube with a goal of prolonging life then we will need to be looking at his back with an MRI to see how deep the infection is to see  how to be best treated.  He wishes for [place] to do those things.  I discussed about options for [place] not to do anything but focus on comfort, treat the pain, anxiety and focus on dignity and not be too aggressive.  He is not interested in that pathway at this point.  Son was present at bedside, my recommendations are that even if we do the feeding tube and treat the infection for him to be set up with hospice regardless.  Principal problem Acute kidney injury on CKD 3 A -creatinine ranging normally around 1.1, received IV fluids and is now back to baseline.  Possibly in the setting of dehydration - Creatinine is stable and at baseline  Active problems SIRS -with leukocytosis, fever of 100.6, heart rate of 121 on admission.  COVID, flu, RSV, RVP all negative.  CT scan chest abdomen pelvis fairly unremarkable.  He does have a sacral unstageable wound which may be the source.  Has been placed on antibiotics, continue while monitoring cultures.  - Obtain MRI of the lumbar spine  DM2 with hyperglycemia -elevated sugars in the setting of concern for infection, dehydration.  CBGs now improved, has been placed on sliding scale, continue.  Acute metabolic encephalopathy -clearing this morning, more appropriate  Parkinson's disease, dysphagia-continue home medications with Sinemet.  IR consult for PEG tube  Elevated lactic acid -in the setting of SIRS, dehydration  Hypophosphatemia -has been replaced, now normal  Hypokalemia -has been replaced and now normalized  Essential hypertension - continue metoprolol  Scheduled Meds:  carbidopa-levodopa  2 tablet Oral TID AC  Chlorhexidine Gluconate Cloth  6 each Topical Daily   feeding supplement  237 mL Oral BID BM   insulin aspart  0-15 Units Subcutaneous TID WC   insulin aspart  0-5 Units Subcutaneous QHS   leptospermum manuka honey  1 Application Topical Daily   metoprolol tartrate  25 mg Oral BID   mirtazapine  7.5 mg Oral QHS   pantoprazole  40 mg  Oral Q0600   polyethylene glycol  17 g Oral Daily   senna  1 tablet Oral BID   thiamine (VITAMIN B1) injection  100 mg Intravenous Daily   Continuous Infusions:  ceFEPime (MAXIPIME) IV 2 g (04/23/23 0911)   metronidazole 500 mg (04/23/23 0218)   vancomycin 1,000 mg (04/22/23 1518)   PRN Meds:.acetaminophen **OR** acetaminophen, albuterol, dextrose, fentaNYL (SUBLIMAZE) injection, HYDROcodone-acetaminophen, lactulose, ondansetron **OR** ondansetron (ZOFRAN) IV, mouth rinse  Current Outpatient Medications  Medication Instructions   acetaminophen (TYLENOL) 650 mg, Every 8 hours PRN   ammonium lactate (LAC-HYDRIN) 12 % lotion 1 Application, Nightly   aspirin EC 81 mg, Daily   carbidopa-levodopa (SINEMET IR) 25-100 MG tablet 2 tablets, Oral, 3 times daily, At 8am, noon, 5pm   furosemide (LASIX) 40 mg, Oral, As needed   hydroxyurea (HYDREA) 500 MG capsule TAKE 1 CAPSULE BY MOUTH ONCE DAILY WITH FOOD   lactulose (CHRONULAC) 20 g, Oral, 2 times daily PRN   magnesium hydroxide (MILK OF MAGNESIA) 400 MG/5ML suspension 30 mLs, Oral, Daily PRN   Menthol, Topical Analgesic, (BIOFREEZE ROLL-ON) 4 % GEL 1 Application, Every 4 hours PRN   methocarbamol (ROBAXIN) 500 mg, Oral, Every 6 hours PRN   metoprolol tartrate (LOPRESSOR) 25 mg, Oral, 2 times daily   mirtazapine (REMERON) 7.5 mg, Daily at bedtime   Multiple Vitamin (MULTIVITAMIN WITH MINERALS) TABS tablet 1 tablet, Every morning   pantoprazole (PROTONIX) 40 mg, Oral, Daily   polyethylene glycol (MIRALAX / GLYCOLAX) 17 g, Daily   PROSOURCE PROTEIN PO 1 Bottle, Daily   senna (SENOKOT) 8.6 mg, Oral, 2 times daily   zinc oxide 20 % ointment 1 Application, 3 times daily PRN    Diet Orders (From admission, onward)     Start     Ordered   04/21/23 2322  Diet Carb Modified Fluid consistency: Thin; Room service appropriate? Yes  Diet effective now       Question Answer Comment  Diet-HS Snack? Nothing   Calorie Level Medium 1600-2000   Fluid  consistency: Thin   Room service appropriate? Yes      04/21/23 2322            DVT prophylaxis: SCDs Start: 04/21/23 2322   Lab Results  Component Value Date   PLT 418 (H) 04/23/2023      Code Status: Limited: Do not attempt resuscitation (DNR) -DNR-LIMITED -Do Not Intubate/DNI   Family Communication: Son at bedside  Status is: Inpatient   Level of care: Telemetry  Consultants:  Palliative care  Objective: Vitals:   04/22/23 1439 04/22/23 2108 04/22/23 2158 04/23/23 0355  BP: (!) 146/94 (!) 143/86 133/89 138/81  Pulse: 92 86 83 91  Resp: 14 20  20   Temp: 98.2 F (36.8 C) 97.6 F (36.4 C)  97.6 F (36.4 C)  TempSrc:  Oral  Oral  SpO2: 97% 97%  97%  Weight:      Height:        Intake/Output Summary (Last 24 hours) at 04/23/2023 1125 Last data filed at 04/23/2023 0500 Gross per 24 hour  Intake  568.81 ml  Output 1000 ml  Net -431.19 ml   Wt Readings from Last 3 Encounters:  04/22/23 72.4 kg  10/01/21 86.2 kg  09/20/21 90 kg    Examination:  Constitutional: NAD Eyes: lids and conjunctivae normal, no scleral icterus ENMT: mmm Neck: normal, supple Respiratory: clear to auscultation bilaterally, no wheezing, no crackles.  Cardiovascular: Regular rate and rhythm, no murmurs / rubs / gallops. No LE edema. Abdomen: soft, no distention, no tenderness. Bowel sounds positive.    Data Reviewed: I have independently reviewed following labs and imaging studies   CBC Recent Labs  Lab 04/21/23 1126 04/22/23 0605 04/23/23 0350  WBC 17.2* 14.7* 10.4  HGB 16.9 15.1 14.9  HCT 52.1* 48.4 47.2  PLT 690* 528* 418*  MCV 103.8* 109.0* 107.5*  MCH 33.7 34.0 33.9  MCHC 32.4 31.2 31.6  RDW 15.9* 16.1* 15.9*  LYMPHSABS 0.8  --   --   MONOABS 1.1*  --   --   EOSABS 0.0  --   --   BASOSABS 0.1  --   --     Recent Labs  Lab 04/21/23 1116 04/21/23 1126 04/21/23 1356 04/21/23 1840 04/21/23 1942 04/22/23 0100 04/22/23 0605 04/23/23 0350  NA  --  137  --    --  141 143 143 140  K  --  4.3  --   --  3.4* 3.8 3.6 3.9  CL  --  100  --   --  109 112* 110 112*  CO2  --  20*  --   --  23 22 23  21*  GLUCOSE  --  604*  --   --  314* 113* 189* 150*  BUN  --  56*  --   --  47* 45* 44* 31*  CREATININE  --  1.42*  --   --  1.15 0.96 0.97 0.84  CALCIUM  --  9.8  --   --  9.5 9.4 8.9 8.8*  AST  --  14*  --   --   --   --  13* 13*  ALT  --  9  --   --   --   --  10 <5  ALKPHOS  --  75  --   --   --   --  56 52  BILITOT  --  1.3*  --   --   --   --  0.9 1.2  ALBUMIN  --  3.2*  --   --   --   --  2.6* 2.4*  MG  --   --   --   --  2.1  --  2.1 2.1  PROCALCITON  --   --   --   --  <0.10  --   --   --   LATICACIDVEN 2.1*  --  3.2* 3.4*  --   --   --   --   INR  --  1.3*  --   --   --   --   --   --     ------------------------------------------------------------------------------------------------------------------ No results for input(s): "CHOL", "HDL", "LDLCALC", "TRIG", "CHOLHDL", "LDLDIRECT" in the last 72 hours.  Lab Results  Component Value Date   HGBA1C 6.9 (H) 12/12/2022   ------------------------------------------------------------------------------------------------------------------ No results for input(s): "TSH", "T4TOTAL", "T3FREE", "THYROIDAB" in the last 72 hours.  Invalid input(s): "FREET3"  Cardiac Enzymes No results for input(s): "CKMB", "TROPONINI", "MYOGLOBIN" in the last 168 hours.  Invalid input(s): "CK" ------------------------------------------------------------------------------------------------------------------ No results  found for: "BNP"  CBG: Recent Labs  Lab 04/22/23 0752 04/22/23 1204 04/22/23 1638 04/22/23 2109 04/23/23 0739  GLUCAP 194* 174* 202* 151* 162*    Recent Results (from the past 240 hours)  Blood Culture (routine x 2)     Status: None (Preliminary result)   Collection Time: 04/21/23 11:20 AM   Specimen: BLOOD LEFT FOREARM  Result Value Ref Range Status   Specimen Description   Final     BLOOD LEFT FOREARM Performed at [facility], [address], [place], [place] [address]    Special Requests   Final    BOTTLES DRAWN AEROBIC AND ANAEROBIC Blood Culture adequate volume Performed at [facility], [address] [place], [place] [address]    Culture   Final    NO GROWTH 2 DAYS Performed at [facility], [address] [place], [place] [address]    Report Status PENDING  Incomplete  Blood Culture (routine x 2)     Status: None (Preliminary result)   Collection Time: 04/21/23 11:20 AM   Specimen: BLOOD RIGHT FOREARM  Result Value Ref Range Status   Specimen Description   Final    BLOOD RIGHT FOREARM Performed at [facility], [address], [place], [place] [address]    Special Requests   Final    BOTTLES DRAWN AEROBIC AND ANAEROBIC Blood Culture adequate volume Performed at [facility], [address] [place], [place] [address]    Culture   Final    NO GROWTH 2 DAYS Performed at [place] [facility], [address], [place], [place] [address]    Report Status PENDING  Incomplete  Resp panel by RT-PCR (RSV, Flu A&B, Covid)     Status: None   Collection Time: 04/21/23 11:25 AM   Specimen: Nasal Swab  Result Value Ref Range Status   SARS Coronavirus 2 by RT PCR NEGATIVE NEGATIVE Final    Comment: (NOTE) SARS-CoV-2 target nucleic acids are NOT DETECTED.  The SARS-CoV-2 RNA is generally detectable in upper respiratory specimens during the acute phase of infection. The lowest concentration of SARS-CoV-2 viral copies this assay can detect is 138 copies/mL. A negative result does not preclude SARS-Cov-2 infection and should not be used as the sole basis for treatment or other patient management decisions. A negative result may occur with  improper specimen collection/handling, submission of specimen other than nasopharyngeal swab, presence of viral mutation(s) within the areas targeted by this assay, and  inadequate number of viral copies(<138 copies/mL). A negative result must be combined with clinical observations, patient history, and epidemiological information. The expected result is Negative.  Fact Sheet for Patients:  BloggerCourse.com  Fact Sheet for Healthcare Providers:  SeriousBroker.it  This test is no t yet approved or cleared by the [place]  FDA and  has been authorized for detection and/or diagnosis of SARS-CoV-2 by FDA under an Emergency Use Authorization (EUA). This EUA will remain  in effect (meaning this test can be used) for the duration of the COVID-19 declaration under Section 564(b)(1) of the Act, 21 U.S.C.section 360bbb-3(b)(1), unless the authorization is terminated  or revoked sooner.       Influenza A by PCR NEGATIVE NEGATIVE Final   Influenza B by PCR NEGATIVE NEGATIVE Final    Comment: (NOTE) The Xpert Xpress SARS-CoV-2/FLU/RSV plus assay is intended as an aid in the diagnosis of influenza from Nasopharyngeal swab specimens and should not be used as a sole basis  for treatment. Nasal washings and aspirates are unacceptable for Xpert Xpress SARS-CoV-2/FLU/RSV testing.  Fact Sheet for Patients: BloggerCourse.com  Fact Sheet for Healthcare Providers: SeriousBroker.it  This test is not yet approved or cleared by the [place]  FDA and has been authorized for detection and/or diagnosis of SARS-CoV-2 by FDA under an Emergency Use Authorization (EUA). This EUA will remain in effect (meaning this test can be used) for the duration of the COVID-19 declaration under Section 564(b)(1) of the Act, 21 U.S.C. section 360bbb-3(b)(1), unless the authorization is terminated or revoked.     Resp Syncytial Virus by PCR NEGATIVE NEGATIVE Final    Comment: (NOTE) Fact Sheet for Patients: BloggerCourse.com  Fact Sheet for Healthcare  Providers: SeriousBroker.it  This test is not yet approved or cleared by the [place]  FDA and has been authorized for detection and/or diagnosis of SARS-CoV-2 by FDA under an Emergency Use Authorization (EUA). This EUA will remain in effect (meaning this test can be used) for the duration of the COVID-19 declaration under Section 564(b)(1) of the Act, 21 U.S.C. section 360bbb-3(b)(1), unless the authorization is terminated or revoked.  Performed at [facility], [address] [place], [place] [address]   Respiratory (~20 pathogens) panel by PCR     Status: None   Collection Time: 04/21/23  7:42 PM   Specimen: Nasopharyngeal Swab; Respiratory  Result Value Ref Range Status   Adenovirus NOT DETECTED NOT DETECTED Final   Coronavirus 229E NOT DETECTED NOT DETECTED Final    Comment: (NOTE) The Coronavirus on the Respiratory Panel, DOES NOT test for the novel  Coronavirus (2019 nCoV)    Coronavirus HKU1 NOT DETECTED NOT DETECTED Final   Coronavirus NL63 NOT DETECTED NOT DETECTED Final   Coronavirus OC43 NOT DETECTED NOT DETECTED Final   Metapneumovirus NOT DETECTED NOT DETECTED Final   Rhinovirus / Enterovirus NOT DETECTED NOT DETECTED Final   Influenza A NOT DETECTED NOT DETECTED Final   Influenza B NOT DETECTED NOT DETECTED Final   Parainfluenza Virus 1 NOT DETECTED NOT DETECTED Final   Parainfluenza Virus 2 NOT DETECTED NOT DETECTED Final   Parainfluenza Virus 3 NOT DETECTED NOT DETECTED Final   Parainfluenza Virus 4 NOT DETECTED NOT DETECTED Final   Respiratory Syncytial Virus NOT DETECTED NOT DETECTED Final   Bordetella pertussis NOT DETECTED NOT DETECTED Final   Bordetella Parapertussis NOT DETECTED NOT DETECTED Final   Chlamydophila pneumoniae NOT DETECTED NOT DETECTED Final   Mycoplasma pneumoniae NOT DETECTED NOT DETECTED Final    Comment: Performed at [facility], [address], [place], [place] [address]  MRSA  Next Gen by PCR, Nasal     Status: None   Collection Time: 04/21/23 10:33 PM   Specimen: Nasal Mucosa; Nasal Swab  Result Value Ref Range Status   MRSA by PCR Next Gen NOT DETECTED NOT DETECTED Final    Comment: (NOTE) The GeneXpert MRSA Assay (FDA approved for NASAL specimens only), is one component of a comprehensive MRSA colonization surveillance program. It is not intended to diagnose MRSA infection nor to guide or monitor treatment for MRSA infections. Test performance is not FDA approved in patients less than 72 years old. Performed at [facility], [address] [place], [place] [address]      Radiology Studies: No results found.    Kathlen Para, MD, PhD [facility]  Between 7 am - 7 pm I am available, please contact me via Amion (for emergencies) or Securechat (non urgent messages)  Between 7 pm - 7 am I am not available, please contact night coverage MD/APP via Amion

## 2023-04-23 NOTE — TOC Initial Note (Signed)
 Transition of Care [facility]) - Initial/Assessment Note    Patient Details  Name: [name] MRN: [id number] Date of Birth: 02-13-1932  Transition of [facility]) CM/SW Contact:    Diona Browner, LCSW Phone Number: 04/23/2023, 2:26 PM  Clinical Narrative:                 Pt from [facility]. Pt continues medical workup. Pt son at bedside and is pt support system. TOC following for d/c needs.    Barriers to Discharge: Continued Medical Work up   Patient Goals and CMS Choice Patient states their goals for this hospitalization and ongoing recovery are:: return home CMS Medicare.gov Compare Post Acute Care list provided to::  (NA) Choice offered to / list presented to : NA [place] ownership interest in [facility].provided to::  (NA)    Expected Discharge Plan and Services In-house Referral: NA   Post Acute Care Choice: Skilled Nursing Facility Living arrangements for the past 2 months: Skilled Nursing Facility                 DME Arranged: N/A DME Agency: NA       HH Arranged: NA HH Agency: NA        Prior Living Arrangements/Services Living arrangements for the past 2 months: Skilled Nursing Facility Lives with:: Adult Children Patient language and need for interpreter reviewed:: Yes Do you feel safe going back to the place where you live?: Yes      Need for Family Participation in Patient Care: Yes (Comment) Care giver support system in place?: Yes (comment)   Criminal Activity/Legal Involvement Pertinent to Current Situation/Hospitalization: No - Comment as needed  Activities of Daily Living   ADL Screening (condition at time of admission) Independently performs ADLs?: No Is the patient deaf or have difficulty hearing?: Yes Does the patient have difficulty seeing, even when wearing glasses/contacts?: No Does the patient have difficulty concentrating, remembering, or making decisions?: No  Permission Sought/Granted                   Emotional Assessment Appearance:: Appears stated age     Orientation: : Oriented to  Time, Oriented to Situation, Oriented to Place, Oriented to Self Alcohol / Substance Use: Not Applicable Psych Involvement: No (comment)  Admission diagnosis:  SIRS (systemic inflammatory response syndrome) (HCC) [R65.10] Severe sepsis (HCC) [A41.9, R65.20] Patient Active Problem List   Diagnosis Date Noted   Malnutrition of moderate degree 04/23/2023   SIRS (systemic inflammatory response syndrome) (HCC) 04/21/2023   Decubitus ulcer 04/21/2023   CKD (chronic kidney disease), stage III (HCC) 04/21/2023   Hypophosphatemia 04/21/2023   Acetabulum fracture (HCC) 12/11/2022   Gait abnormality 10/01/2021   Parkinsonism (HCC) 10/01/2021   Thoracic aortic aneurysm (HCC) 08/16/2020   Bilateral lower extremity edema 06/11/2019   Monoclonal gammopathy of unknown significance 01/08/2013   Myeloproliferative disorder (HCC) 10/09/2012   Cervicalgia 09/03/2012   Diabetes mellitus type 2 with complications (HCC) 09/02/2012   Diabetic neuropathy (HCC) 09/02/2012   AKI (acute kidney injury) (HCC) 06/18/2012   Orthostatic hypotension 06/18/2012   Gait instability 06/18/2012   Protein-calorie malnutrition, severe (HCC) 06/18/2012   Dizziness of unknown cause 06/17/2012   Acute renal failure (HCC) 06/17/2012   Leukocytopenia 06/17/2012   Thrombocytosis 06/17/2012   HYPERLIPIDEMIA 02/29/2008   Coronary atherosclerosis 02/29/2008   GERD 02/29/2008   LOW BACK PAIN 02/29/2008   DIABETES MELLITUS, TYPE II 10/15/2007   Essential hypertension 10/15/2007   URI 10/15/2007  PCP:  Tita Form, MD Pharmacy:   [facility] [address] [place] - [address] [place] [address] Phone: [phone] Fax: [phone]     Social Drivers of Health (SDOH) Social History: SDOH Screenings   Food Insecurity: Patient Unable To Answer (04/22/2023)  Housing: Low Risk   (12/11/2022)  Transportation Needs: No Transportation Needs (12/11/2022)  Utilities: Not At Risk (12/11/2022)  Tobacco Use: Medium Risk (04/21/2023)   SDOH Interventions:     Readmission Risk Interventions    04/23/2023    2:23 PM  Readmission Risk Prevention Plan  Transportation Screening Complete  PCP or Specialist Appt within 5-7 Days Complete  Home Care Screening Complete  Medication Review (RN CM) Complete

## 2023-04-23 NOTE — Progress Notes (Signed)
 Daily Progress Note   Patient Name: [name]       Date: 04/23/2023 DOB: 04/18/1932  Age: 89 y.o. MRN#: [id number] Attending Physician: Osborn Blaze, MD Primary Care Physician: Amin, Saad, MD Admit Date: 04/21/2023 Length of Stay: 1 day  Reason for Consultation/Follow-up: Establishing goals of care  HPI/Patient Profile:  89 y.o. 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  At baseline he ambulates with a walker, feeds himself and can carry a normal conversation, but had a fall few months ago with known pelvic fracture and has been bedbound since. He was admitted on 04/21/2023 with AKI on CKD 3A, SIRS with likely wound infection as source, acute metabolic encephalopathy, Parkinson's disease, and others.    Palliative medicine consulted for GOC conversations.  Subjective:   Subjective: Chart Reviewed. Updates received. Patient Assessed. Created space and opportunity for patient  and family to explore thoughts and feelings regarding current medical situation.  Today's Discussion: Today I saw the patient at bedside, his son [name] was also present.  Also present was Noland Battles, PA with [facility] surgery.  Surgical PA was going through all the explanation about how the patient is not a candidate for percutaneous G-tube placement.  Thoroughly explained pathophysiology, risks/benefits of surgical G-tube placement.  Discussed clearly that it may not be an option, but could be discussed with surgeon.  I did of the conversation the patient clearly stated, multiple times, that he wanted to proceed with the G-tube placement.  It was clearly explained to the patient, and he verbalized understanding, that this would not likely change his overall quality of life or prognosis.   He again expressed desire to "do what ever needs to be done."  Patient's family is supportive of the patient's decisions.  After speaking with the patient and spoke with his son in the hall.  Son fears that his father may be making the wrong decision, although he wants to support his decision as long as he is able to make his decisions.  We shared that it may not be an option, surgery would need to weigh in.  We discussed that if the surgeon declines to proceed because of risk or other reason then there would be only left the option of comfort focused approach.  The patient's son shares that he feels that if the patient is told that it is not an option, he would be accepting of this.  I encouraged him to continue to speak amongst himself.  Later in the day the surgeons sent a secure chat to myself and the hospitalist indicating that upon further speaking to the patient he is now reconsidering his desire for G-tube placement.  He seemed surprised when speaking with the surgeon that it would not help his swallowing.  The patient expressed a desire to think about it further.  I called and updated the patient's son [name] about the surgeons input and his father seeming to reconsider G-tube placement.  Son is deciding that he will let his father think about it overnight and he will be in in the morning and they will have a good conversation about it as well.  I shared that I would be back in the afternoon and can support these conversations ongoing as needed.  I provided emotional and general support through therapeutic listening,  empathy, sharing of stories, and other techniques. I answered all questions and addressed all concerns to the best of my ability.  Review of Systems  Constitutional:  Positive for fatigue.  Gastrointestinal:  Negative for abdominal pain, nausea and vomiting.    Objective:   Vital Signs:  BP 138/81 (BP Location: Right Arm)   Pulse 91   Temp 97.6 F (36.4 C) (Oral)   Resp 20    Ht 6' (1.829 m)   Wt 72.4 kg   SpO2 97%   BMI 21.65 kg/m   Physical Exam Vitals and nursing note reviewed.  Constitutional:      General: He is sleeping. He is not in acute distress. HENT:     Head: Normocephalic and atraumatic.  Pulmonary:     Effort: Pulmonary effort is normal. No respiratory distress.  Abdominal:     General: Abdomen is flat.  Neurological:     General: No focal deficit present.     Mental Status: He is oriented to person, place, and time and easily aroused.  Psychiatric:        Mood and Affect: Mood normal.        Behavior: Behavior normal.     Palliative Assessment/Data: 20-30%    Existing Vynca/ACP Documentation: None  Assessment & Plan:   Impression: Present on Admission:  SIRS (systemic inflammatory response syndrome) (HCC)  Acute renal failure (HCC)  Coronary atherosclerosis  Diabetes mellitus type 2 with complications (HCC)  Essential hypertension  Myeloproliferative disorder (HCC)  Parkinsonism (HCC)  Protein-calorie malnutrition, severe (HCC)  Decubitus ulcer  CKD (chronic kidney disease), stage III (HCC)  Hypophosphatemia  89 year old male with acute presentation chronic comorbidities as described above. Unfortunately patient here with acute infection likely from his sacral wound since being bedbound after pelvic fracture. Very limited in ambulatory ability at this point, mostly bedbound which makes treating his wound difficult. Family is very clear that antibiotics are not a magic fix for this. We also had a good discussion about feeding tubes and they understand feeding tubes would not provide substantial quality improvement. Family seems to be considering going back to [facility] facility with hospice services in place.  Family desires for patient to be able to make his own decision, and he has capacity to do so.  He was initially wanting feeding tube and now after discussing with the surgeon he seems to be reconsidering.  Will allow  time for family discussion and patient thought. Overall prognosis poor.   SUMMARY OF RECOMMENDATIONS   DNR-limited Continue to treat the treatable for now Ongoing GOC discussions about G-tube surgical placement versus comfort focused approach Continue emotional support of patient and family Palliative medicine will follow-up tomorrow for ongoing discussions  Symptom Management:  Per primary team PMT is available to assist as needed  Code Status: DNR-limited  Prognosis: Unable to determine  Discharge Planning: To Be Determined  Discussed with: Patient, family, medical team, nursing team  Thank you for allowing [place] to participate in the care of Yussef A Maniscalco PMT will continue to support holistically.  Time Total: 52 min  Detailed review of medical records (labs, imaging, vital signs), medically appropriate exam, discussed with treatment team, counseling and education to patient, family, & staff, documenting clinical information, medication management, coordination of care  Wynne Dust, NP Palliative Medicine Team  Team Phone # [phone] (Nights/Weekends)  09/05/2020, 8:17 AM

## 2023-04-23 NOTE — Plan of Care (Signed)
   Problem: Fluid Volume: Goal: Hemodynamic stability will improve Outcome: Progressing   Problem: Clinical Measurements: Goal: Diagnostic test results will improve Outcome: Progressing Goal: Signs and symptoms of infection will decrease Outcome: Progressing   Problem: Respiratory: Goal: Ability to maintain adequate ventilation will improve Outcome: Progressing

## 2023-04-24 ENCOUNTER — Inpatient Hospital Stay (HOSPITAL_COMMUNITY)

## 2023-04-24 DIAGNOSIS — A419 Sepsis, unspecified organism: Secondary | ICD-10-CM | POA: Diagnosis not present

## 2023-04-24 DIAGNOSIS — Z789 Other specified health status: Secondary | ICD-10-CM

## 2023-04-24 DIAGNOSIS — Z515 Encounter for palliative care: Secondary | ICD-10-CM | POA: Diagnosis not present

## 2023-04-24 DIAGNOSIS — Z66 Do not resuscitate: Secondary | ICD-10-CM

## 2023-04-24 DIAGNOSIS — Z7189 Other specified counseling: Secondary | ICD-10-CM | POA: Diagnosis not present

## 2023-04-24 DIAGNOSIS — E440 Moderate protein-calorie malnutrition: Secondary | ICD-10-CM

## 2023-04-24 LAB — CBC
HCT: 48 % (ref 39.0–52.0)
Hemoglobin: 15.1 g/dL (ref 13.0–17.0)
MCH: 34.1 pg — ABNORMAL HIGH (ref 26.0–34.0)
MCHC: 31.5 g/dL (ref 30.0–36.0)
MCV: 108.4 fL — ABNORMAL HIGH (ref 80.0–100.0)
Platelets: 452 10*3/uL — ABNORMAL HIGH (ref 150–400)
RBC: 4.43 MIL/uL (ref 4.22–5.81)
RDW: 16 % — ABNORMAL HIGH (ref 11.5–15.5)
WBC: 15.3 10*3/uL — ABNORMAL HIGH (ref 4.0–10.5)
nRBC: 0 % (ref 0.0–0.2)

## 2023-04-24 LAB — BASIC METABOLIC PANEL WITH GFR
Anion gap: 9 (ref 5–15)
BUN: 25 mg/dL — ABNORMAL HIGH (ref 8–23)
CO2: 22 mmol/L (ref 22–32)
Calcium: 8.9 mg/dL (ref 8.9–10.3)
Chloride: 107 mmol/L (ref 98–111)
Creatinine, Ser: 0.99 mg/dL (ref 0.61–1.24)
GFR, Estimated: >60 mL/min (ref >60)
Glucose, Bld: 175 mg/dL — ABNORMAL HIGH (ref 70–99)
Potassium: 4.4 mmol/L (ref 3.5–5.1)
Sodium: 138 mmol/L (ref 135–145)

## 2023-04-24 LAB — MAGNESIUM: Magnesium: 2 mg/dL (ref 1.7–2.4)

## 2023-04-24 LAB — GLUCOSE, CAPILLARY
Glucose-Capillary: 193 mg/dL — ABNORMAL HIGH (ref 70–99)
Glucose-Capillary: 235 mg/dL — ABNORMAL HIGH (ref 70–99)
Glucose-Capillary: 268 mg/dL — ABNORMAL HIGH (ref 70–99)
Glucose-Capillary: 308 mg/dL — ABNORMAL HIGH (ref 70–99)

## 2023-04-24 LAB — VITAMIN B1: Vitamin B1 (Thiamine): 223.6 nmol/L — ABNORMAL HIGH (ref 66.5–200.0)

## 2023-04-24 MED ORDER — GADOBUTROL 1 MMOL/ML IV SOLN
7.0000 mL | Freq: Once | INTRAVENOUS | Status: AC | PRN
  Administered 2023-04-24: 7 mL via INTRAVENOUS

## 2023-04-24 NOTE — Progress Notes (Signed)
 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  At baseline he ambulates with a walker, feeds himself and can carry a normal conversation, but had a fall few months ago with known pelvic fracture and has been bedbound since.  Subjective / 24h Interval events: Complains of being thirsty this morning  Assesement and Plan: Principal Problem:   SIRS (systemic inflammatory response syndrome) (HCC) Active Problems:   Acute renal failure (HCC)   Protein-calorie malnutrition, severe (HCC)   Essential hypertension   Coronary atherosclerosis   Diabetes mellitus type 2 with complications (HCC)   Myeloproliferative disorder (HCC)   Parkinsonism (HCC)   Decubitus ulcer   CKD (chronic kidney disease), stage III (HCC)   Hypophosphatemia   Malnutrition of moderate degree  Principal problem Goals of care discussions -have discussed extensively with the patient and the patient's son at bedside 4/16 and 4/17.  There have been conversations even prior to being hospitalized about the feeding tube and patient has expressed his wishes that he wants a feeding tube.  I detailed to the patient his medical conditions, recent pelvic fracture and immobility resulting in sacral decubitus ulcer which is potentially infected.  I discussed about feeding tube will give him calories and will not prevent further aspirations, nor help too much with healing the sacral wound unless he is up and walking.  Feeding tube also will not help with him swallowing better.  If he undergoes a feeding tube with a goal of prolonging life then we will need to be looking at his  back with an MRI to see how deep the infection is to see how to be best treated to give him a complete chance of potential recovery.  He wishes for [place] to do those things.  I discussed about options for [place] not to do anything but focus on comfort, treat the pain, anxiety and focus on dignity and not be too aggressive.  He is not interested in that pathway at this point.  Son was present at bedside, my recommendations are that even if we do the feeding tube and treat the infection for him to be set up with hospice regardless. - General Surgery evaluated patient as IR could not place a percutaneous tube.  There are ongoing discussion between surgical team and family, since placing a G-tube carries its own risk and patient wants to think about it.  Active problems Acute kidney injury on CKD 3 A -creatinine ranging normally around 1.1, received IV fluids and is now back to baseline.  Possibly in the setting of dehydration - Creatinine stable this morning  SIRS -with leukocytosis, fever of 100.6, heart rate of 121 on admission.  COVID, flu, RSV, RVP all negative.  CT scan chest abdomen pelvis fairly unremarkable.  He does have a sacral unstageable wound which may be the source.  Has been placed on antibiotics, continue while monitoring cultures.  - MRI of the lumbar spine pending.  Initially an MRI was not planned, however with the patient wanting feeding tube and ongoing care this was obtained this morning  DM2 with hyperglycemia -elevated sugars in the setting of concern for infection, dehydration.  CBGs now improved, has been placed  on sliding scale, continue.  Acute metabolic encephalopathy -clearing this morning, more appropriate  Parkinson's disease, dysphagia-continue home medications with Sinemet.  Surgical consultation and final discussions pending for G-tube  Elevated lactic acid -in the setting of SIRS, dehydration  Hypophosphatemia -has been replaced, now normal  Hypokalemia -has been  replaced and now normalized  Essential hypertension - continue metoprolol  Scheduled Meds:  carbidopa-levodopa  2 tablet Oral TID AC   Chlorhexidine Gluconate Cloth  6 each Topical Daily   feeding supplement  237 mL Oral BID BM   insulin aspart  0-15 Units Subcutaneous TID WC   insulin aspart  0-5 Units Subcutaneous QHS   leptospermum manuka honey  1 Application Topical Daily   metoprolol tartrate  25 mg Oral BID   mirtazapine  7.5 mg Oral QHS   pantoprazole  40 mg Oral Q0600   polyethylene glycol  17 g Oral Daily   senna  1 tablet Oral BID   thiamine (VITAMIN B1) injection  100 mg Intravenous Daily   Continuous Infusions:  cefTRIAXone (ROCEPHIN)  IV 2 g (04/23/23 2217)   metronidazole 500 mg (04/24/23 0128)   vancomycin     PRN Meds:.acetaminophen **OR** acetaminophen, albuterol, dextrose, fentaNYL (SUBLIMAZE) injection, HYDROcodone-acetaminophen, lactulose, ondansetron **OR** ondansetron (ZOFRAN) IV, mouth rinse  Current Outpatient Medications  Medication Instructions   acetaminophen (TYLENOL) 650 mg, Every 8 hours PRN   ammonium lactate (LAC-HYDRIN) 12 % lotion 1 Application, Nightly   aspirin EC 81 mg, Daily   carbidopa-levodopa (SINEMET IR) 25-100 MG tablet 2 tablets, Oral, 3 times daily, At 8am, noon, 5pm   furosemide (LASIX) 40 mg, Oral, As needed   hydroxyurea (HYDREA) 500 MG capsule TAKE 1 CAPSULE BY MOUTH ONCE DAILY WITH FOOD   lactulose (CHRONULAC) 20 g, Oral, 2 times daily PRN   magnesium hydroxide (MILK OF MAGNESIA) 400 MG/5ML suspension 30 mLs, Oral, Daily PRN   Menthol, Topical Analgesic, (BIOFREEZE ROLL-ON) 4 % GEL 1 Application, Every 4 hours PRN   methocarbamol (ROBAXIN) 500 mg, Oral, Every 6 hours PRN   metoprolol tartrate (LOPRESSOR) 25 mg, Oral, 2 times daily   mirtazapine (REMERON) 7.5 mg, Daily at bedtime   Multiple Vitamin (MULTIVITAMIN WITH MINERALS) TABS tablet 1 tablet, Every morning   pantoprazole (PROTONIX) 40 mg, Oral, Daily   polyethylene glycol  (MIRALAX / GLYCOLAX) 17 g, Daily   PROSOURCE PROTEIN PO 1 Bottle, Daily   senna (SENOKOT) 8.6 mg, Oral, 2 times daily   zinc oxide 20 % ointment 1 Application, 3 times daily PRN    Diet Orders (From admission, onward)     Start     Ordered   04/24/23 0001  Diet NPO time specified Except for: Sips with Meds  Diet effective midnight       Question:  Except for  Answer:  Sips with Meds   04/23/23 1434            DVT prophylaxis: SCDs Start: 04/21/23 2322   Lab Results  Component Value Date   PLT 452 (H) 04/24/2023      Code Status: Limited: Do not attempt resuscitation (DNR) -DNR-LIMITED -Do Not Intubate/DNI   Family Communication: Son at bedside  Status is: Inpatient   Level of care: Telemetry  Consultants:  Palliative care  Objective: Vitals:   04/23/23 1422 04/23/23 2046 04/23/23 2208 04/24/23 0458  BP: 117/75 113/71 125/75 126/76  Pulse: 89 94 89 (!) 101  Resp: 18 20  18   Temp: 97.7 F (36.5 C) 97.6 F (  36.4 C)  99 F (37.2 C)  TempSrc: Oral Oral  Oral  SpO2: 95% 97%  96%  Weight:      Height:        Intake/Output Summary (Last 24 hours) at 04/24/2023 0914 Last data filed at 04/24/2023 0500 Gross per 24 hour  Intake 330.57 ml  Output 1150 ml  Net -819.43 ml   Wt Readings from Last 3 Encounters:  04/22/23 72.4 kg  10/01/21 86.2 kg  09/20/21 90 kg    Examination:  Constitutional: NAD Eyes: lids and conjunctivae normal, no scleral icterus ENMT: mmm Neck: normal, supple Respiratory: clear to auscultation bilaterally, no wheezing, no crackles. Normal respiratory effort.  Cardiovascular: Regular rate and rhythm, no murmurs / rubs / gallops. No LE edema. Abdomen: soft, no distention, no tenderness. Bowel sounds positive.    Data Reviewed: I have independently reviewed following labs and imaging studies   CBC Recent Labs  Lab 04/21/23 1126 04/22/23 0605 04/23/23 0350 04/24/23 0402  WBC 17.2* 14.7* 10.4 15.3*  HGB 16.9 15.1 14.9 15.1   HCT 52.1* 48.4 47.2 48.0  PLT 690* 528* 418* 452*  MCV 103.8* 109.0* 107.5* 108.4*  MCH 33.7 34.0 33.9 34.1*  MCHC 32.4 31.2 31.6 31.5  RDW 15.9* 16.1* 15.9* 16.0*  LYMPHSABS 0.8  --   --   --   MONOABS 1.1*  --   --   --   EOSABS 0.0  --   --   --   BASOSABS 0.1  --   --   --     Recent Labs  Lab 04/21/23 1116 04/21/23 1126 04/21/23 1126 04/21/23 1356 04/21/23 1840 04/21/23 1942 04/22/23 0100 04/22/23 0605 04/23/23 0350 04/24/23 0402  NA  --  137   < >  --   --  141 143 143 140 138  K  --  4.3   < >  --   --  3.4* 3.8 3.6 3.9 4.4  CL  --  100   < >  --   --  109 112* 110 112* 107  CO2  --  20*   < >  --   --  23 22 23  21* 22  GLUCOSE  --  604*   < >  --   --  314* 113* 189* 150* 175*  BUN  --  56*   < >  --   --  47* 45* 44* 31* 25*  CREATININE  --  1.42*   < >  --   --  1.15 0.96 0.97 0.84 0.99  CALCIUM  --  9.8   < >  --   --  9.5 9.4 8.9 8.8* 8.9  AST  --  14*  --   --   --   --   --  13* 13*  --   ALT  --  9  --   --   --   --   --  10 <5  --   ALKPHOS  --  75  --   --   --   --   --  56 52  --   BILITOT  --  1.3*  --   --   --   --   --  0.9 1.2  --   ALBUMIN  --  3.2*  --   --   --   --   --  2.6* 2.4*  --   MG  --   --   --   --   --  2.1  --  2.1 2.1 2.0  PROCALCITON  --   --   --   --   --  <0.10  --   --   --   --   LATICACIDVEN 2.1*  --   --  3.2* 3.4*  --   --   --   --   --   INR  --  1.3*  --   --   --   --   --   --   --   --    < > = values in this interval not displayed.    ------------------------------------------------------------------------------------------------------------------ No results for input(s): "CHOL", "HDL", "LDLCALC", "TRIG", "CHOLHDL", "LDLDIRECT" in the last 72 hours.  Lab Results  Component Value Date   HGBA1C 6.9 (H) 12/12/2022   ------------------------------------------------------------------------------------------------------------------ No results for input(s): "TSH", "T4TOTAL", "T3FREE", "THYROIDAB" in the last 72  hours.  Invalid input(s): "FREET3"  Cardiac Enzymes No results for input(s): "CKMB", "TROPONINI", "MYOGLOBIN" in the last 168 hours.  Invalid input(s): "CK" ------------------------------------------------------------------------------------------------------------------ No results found for: "BNP"  CBG: Recent Labs  Lab 04/23/23 0739 04/23/23 1204 04/23/23 1822 04/23/23 2100 04/24/23 0744  GLUCAP 162* 165* 239* 188* 193*    Recent Results (from the past 240 hours)  Blood Culture (routine x 2)     Status: None (Preliminary result)   Collection Time: 04/21/23 11:20 AM   Specimen: BLOOD LEFT FOREARM  Result Value Ref Range Status   Specimen Description   Final    BLOOD LEFT FOREARM Performed at [facility], [address], [place], [place] [address]    Special Requests   Final    BOTTLES DRAWN AEROBIC AND ANAEROBIC Blood Culture adequate volume Performed at [facility], [address], [place], [place] [address]    Culture   Final    NO GROWTH 3 DAYS Performed at [facility], [address], [place], [place] [address]    Report Status PENDING  Incomplete  Blood Culture (routine x 2)     Status: None (Preliminary result)   Collection Time: 04/21/23 11:20 AM   Specimen: BLOOD RIGHT FOREARM  Result Value Ref Range Status   Specimen Description   Final    BLOOD RIGHT FOREARM Performed at [facility], [address] [place], [place] [address]    Special Requests   Final    BOTTLES DRAWN AEROBIC AND ANAEROBIC Blood Culture adequate volume Performed at [facility], [address] [place], [place] [address]    Culture   Final    NO GROWTH 3 DAYS Performed at [facility], [address], [place], [place] [address]    Report Status PENDING  Incomplete  Resp panel by RT-PCR (RSV, Flu A&B, Covid)     Status: None   Collection Time: 04/21/23 11:25 AM   Specimen: Nasal Swab  Result Value Ref Range Status    SARS Coronavirus 2 by RT PCR NEGATIVE NEGATIVE Final    Comment: (NOTE) SARS-CoV-2 target nucleic acids are NOT DETECTED.  The SARS-CoV-2 RNA is generally detectable in upper respiratory specimens during the acute phase of infection. The lowest concentration of SARS-CoV-2 viral copies this assay can detect is 138 copies/mL. A negative result does not preclude SARS-Cov-2 infection and should not be used as the sole basis for treatment or other patient management decisions. A negative result may occur with  improper specimen collection/handling, submission of specimen other than nasopharyngeal swab, presence of  viral mutation(s) within the areas targeted by this assay, and inadequate number of viral copies(<138 copies/mL). A negative result must be combined with clinical observations, patient history, and epidemiological information. The expected result is Negative.  Fact Sheet for Patients:  BloggerCourse.com  Fact Sheet for Healthcare Providers:  SeriousBroker.it  This test is no t yet approved or cleared by the [place] FDA and  has been authorized for detection and/or diagnosis of SARS-CoV-2 by FDA under an Emergency Use Authorization (EUA). This EUA will remain  in effect (meaning this test can be used) for the duration of the COVID-19 declaration under Section 564(b)(1) of the Act, 21 U.S.C.section 360bbb-3(b)(1), unless the authorization is terminated  or revoked sooner.       Influenza A by PCR NEGATIVE NEGATIVE Final   Influenza B by PCR NEGATIVE NEGATIVE Final    Comment: (NOTE) The Xpert Xpress SARS-CoV-2/FLU/RSV plus assay is intended as an aid in the diagnosis of influenza from Nasopharyngeal swab specimens and should not be used as a sole basis for treatment. Nasal washings and aspirates are unacceptable for Xpert Xpress SARS-CoV-2/FLU/RSV testing.  Fact Sheet for  Patients: BloggerCourse.com  Fact Sheet for Healthcare Providers: SeriousBroker.it  This test is not yet approved or cleared by the [place] FDA and has been authorized for detection and/or diagnosis of SARS-CoV-2 by FDA under an Emergency Use Authorization (EUA). This EUA will remain in effect (meaning this test can be used) for the duration of the COVID-19 declaration under Section 564(b)(1) of the Act, 21 U.S.C. section 360bbb-3(b)(1), unless the authorization is terminated or revoked.     Resp Syncytial Virus by PCR NEGATIVE NEGATIVE Final    Comment: (NOTE) Fact Sheet for Patients: BloggerCourse.com  Fact Sheet for Healthcare Providers: SeriousBroker.it  This test is not yet approved or cleared by the [place] FDA and has been authorized for detection and/or diagnosis of SARS-CoV-2 by FDA under an Emergency Use Authorization (EUA). This EUA will remain in effect (meaning this test can be used) for the duration of the COVID-19 declaration under Section 564(b)(1) of the Act, 21 U.S.C. section 360bbb-3(b)(1), unless the authorization is terminated or revoked.  Performed at [facility], [address] [place], [place] [address]   Respiratory (~20 pathogens) panel by PCR     Status: None   Collection Time: 04/21/23  7:42 PM   Specimen: Nasopharyngeal Swab; Respiratory  Result Value Ref Range Status   Adenovirus NOT DETECTED NOT DETECTED Final   Coronavirus 229E NOT DETECTED NOT DETECTED Final    Comment: (NOTE) The Coronavirus on the Respiratory Panel, DOES NOT test for the novel  Coronavirus (2019 nCoV)    Coronavirus HKU1 NOT DETECTED NOT DETECTED Final   Coronavirus NL63 NOT DETECTED NOT DETECTED Final   Coronavirus OC43 NOT DETECTED NOT DETECTED Final   Metapneumovirus NOT DETECTED NOT DETECTED Final   Rhinovirus / Enterovirus NOT  DETECTED NOT DETECTED Final   Influenza A NOT DETECTED NOT DETECTED Final   Influenza B NOT DETECTED NOT DETECTED Final   Parainfluenza Virus 1 NOT DETECTED NOT DETECTED Final   Parainfluenza Virus 2 NOT DETECTED NOT DETECTED Final   Parainfluenza Virus 3 NOT DETECTED NOT DETECTED Final   Parainfluenza Virus 4 NOT DETECTED NOT DETECTED Final   Respiratory Syncytial Virus NOT DETECTED NOT DETECTED Final   Bordetella pertussis NOT DETECTED NOT DETECTED Final   Bordetella Parapertussis NOT DETECTED NOT DETECTED Final   Chlamydophila pneumoniae NOT DETECTED NOT DETECTED Final  Mycoplasma pneumoniae NOT DETECTED NOT DETECTED Final    Comment: Performed at [facility], [address] [place], [place] [address]  MRSA Next Gen by PCR, Nasal     Status: None   Collection Time: 04/21/23 10:33 PM   Specimen: Nasal Mucosa; Nasal Swab  Result Value Ref Range Status   MRSA by PCR Next Gen NOT DETECTED NOT DETECTED Final    Comment: (NOTE) The GeneXpert MRSA Assay (FDA approved for NASAL specimens only), is one component of a comprehensive MRSA colonization surveillance program. It is not intended to diagnose MRSA infection nor to guide or monitor treatment for MRSA infections. Test performance is not FDA approved in patients less than 39 years old. Performed at [facility], [address], [place], [place] [address]      Radiology Studies: No results found.    Kathlen Para, MD, PhD [facility]  Between 7 am - 7 pm I am available, please contact me via Amion (for emergencies) or Securechat (non urgent messages)  Between 7 pm - 7 am I am not available, please contact night coverage MD/APP via Amion

## 2023-04-24 NOTE — Plan of Care (Signed)
  Problem: Coping: Goal: Ability to identify and develop effective coping behavior will improve Outcome: Progressing   Problem: Clinical Measurements: Goal: Quality of life will improve Outcome: Progressing   Problem: Role Relationship: Goal: Family's ability to cope with current situation will improve Outcome: Progressing   Problem: Pain Management: Goal: Satisfaction with pain management regimen will improve Outcome: Progressing

## 2023-04-24 NOTE — Progress Notes (Signed)
 [facility] Liaison Note  Received request for hospice services in facility after discharge. Spoke with son, [name] to initiate education related to hospice philosophy, services, and team approach to care. Patient/family verbalized understanding of information given. Per discussion, the plan is for discharge back to facility tomorrow.   DME needs discussed. Patient has access to a facility owned walker and wheelchair as well as a lift chair that is owned by the patient. Patient/family have no other DME needs at this time.   Please send signed and completed DNR home with patient/family. Please provide prescriptions at discharge as needed to ensure ongoing symptom management.   [facility] information and contact numbers given to [name]. Above information shared with Transitions of Care Manager.   Please call with any questions or concerns.   Thank you for the opportunity to participate in this patient's care.   Madelene Schanz BSN, Charity fundraiser, [facility] [employer] [phone]

## 2023-04-24 NOTE — Progress Notes (Signed)
 Patient ID: [name], male   DOB: 21-Jun-1932, 89 y.o.   MRN: [id number]   Acute Care Surgery Service Progress Note:    Chief Complaint/Subjective: Son at bedside, patient states that he wants to drink some water and Ensure He is unsure whether or not he wants the gastrotomy tube  Objective: Vital signs in last 24 hours: Temp:  [97.6 F (36.4 C)-99 F (37.2 C)] 99 F (37.2 C) (04/17 0458) Pulse Rate:  [89-101] 101 (04/17 0458) Resp:  [18-20] 18 (04/17 0458) BP: (113-126)/(71-76) 126/76 (04/17 0458) SpO2:  [95 %-97 %] 96 % (04/17 0458) Last BM Date : 04/23/23  Intake/Output from previous day: 04/16 0701 - 04/17 0700 In: 330.6 [IV Piggyback:330.6] Out: 1150 [Urine:1150] Intake/Output this shift: No intake/output data recorded.  Lungs: , nonlabored  Cardiovascular: reg  Abd: Soft, nontender, nondistended  Extremities: no edema, +SCDs  Neuro: alert, nonfocal  Lab Results: CBC  Recent Labs    04/23/23 0350 04/24/23 0402  WBC 10.4 15.3*  HGB 14.9 15.1  HCT 47.2 48.0  PLT 418* 452*   BMET Recent Labs    04/23/23 0350 04/24/23 0402  NA 140 138  K 3.9 4.4  CL 112* 107  CO2 21* 22  GLUCOSE 150* 175*  BUN 31* 25*  CREATININE 0.84 0.99  CALCIUM 8.8* 8.9   LFT    Latest Ref Rng & Units 04/23/2023    3:50 AM 04/22/2023    6:05 AM 04/21/2023   11:26 AM  Hepatic Function  Total Protein 6.5 - 8.1 g/dL 6.7  6.9  8.9   Albumin 3.5 - 5.0 g/dL 2.4  2.6  3.2   AST 15 - 41 U/L 13  13  14    ALT 0 - 44 U/L 5  10  9    Alk Phosphatase 38 - 126 U/L 52  56  75   Total Bilirubin 0.0 - 1.2 mg/dL 1.2  0.9  1.3    PT/INR No results for input(s): "LABPROT", "INR" in the last 72 hours. ABG No results for input(s): "PHART", "HCO3" in the last 72 hours.  Invalid input(s): "PCO2", "PO2"  Studies/Results:  Anti-infectives: Anti-infectives (From admission, onward)    Start     Dose/Rate Route Frequency Ordered Stop   04/24/23 1400  vancomycin (VANCOREADY) IVPB  1500 mg/300 mL        1,500 mg 150 mL/hr over 120 Minutes Intravenous Every 24 hours 04/23/23 1307     04/23/23 2100  cefTRIAXone (ROCEPHIN) 2 g in sodium chloride 0.9 % 100 mL IVPB        2 g 200 mL/hr over 30 Minutes Intravenous Every 24 hours 04/23/23 1211     04/23/23 1400  vancomycin (VANCOREADY) IVPB 500 mg/100 mL        500 mg 100 mL/hr over 60 Minutes Intravenous  Once 04/23/23 1307 04/23/23 1731   04/22/23 1400  vancomycin (VANCOCIN) IVPB 1000 mg/200 mL premix        1,000 mg 200 mL/hr over 60 Minutes Intravenous Every 24 hours 04/21/23 2007 04/23/23 1554   04/21/23 2200  ceFEPIme (MAXIPIME) 2 g in sodium chloride 0.9 % 100 mL IVPB  Status:  Discontinued        2 g 200 mL/hr over 30 Minutes Intravenous Every 12 hours 04/21/23 2000 04/23/23 1211   04/21/23 1930  metroNIDAZOLE (FLAGYL) IVPB 500 mg        500 mg 100 mL/hr over 60 Minutes Intravenous Every 12 hours 04/21/23 1859 04/28/23 1259  04/21/23 1130  ceFEPIme (MAXIPIME) 2 g in sodium chloride 0.9 % 100 mL IVPB        2 g 200 mL/hr over 30 Minutes Intravenous  Once 04/21/23 1119 04/21/23 1209   04/21/23 1130  metroNIDAZOLE (FLAGYL) IVPB 500 mg        500 mg 100 mL/hr over 60 Minutes Intravenous  Once 04/21/23 1119 04/21/23 1333   04/21/23 1130  vancomycin (VANCOCIN) IVPB 1000 mg/200 mL premix        1,000 mg 200 mL/hr over 60 Minutes Intravenous  Once 04/21/23 1119 04/21/23 1458       Medications: Scheduled Meds:  carbidopa-levodopa  2 tablet Oral TID AC   Chlorhexidine Gluconate Cloth  6 each Topical Daily   feeding supplement  237 mL Oral BID BM   insulin aspart  0-15 Units Subcutaneous TID WC   insulin aspart  0-5 Units Subcutaneous QHS   leptospermum manuka honey  1 Application Topical Daily   metoprolol tartrate  25 mg Oral BID   mirtazapine  7.5 mg Oral QHS   pantoprazole  40 mg Oral Q0600   polyethylene glycol  17 g Oral Daily   senna  1 tablet Oral BID   thiamine (VITAMIN B1) injection  100 mg  Intravenous Daily   Continuous Infusions:  cefTRIAXone (ROCEPHIN)  IV 2 g (04/23/23 2217)   metronidazole 500 mg (04/24/23 0128)   vancomycin     PRN Meds:.acetaminophen **OR** acetaminophen, albuterol, dextrose, fentaNYL (SUBLIMAZE) injection, HYDROcodone-acetaminophen, lactulose, ondansetron **OR** ondansetron (ZOFRAN) IV, mouth rinse  Assessment/Plan: Patient Active Problem List   Diagnosis Date Noted   Malnutrition of moderate degree 04/23/2023   SIRS (systemic inflammatory response syndrome) (HCC) 04/21/2023   Decubitus ulcer 04/21/2023   CKD (chronic kidney disease), stage III (HCC) 04/21/2023   Hypophosphatemia 04/21/2023   Acetabulum fracture (HCC) 12/11/2022   Gait abnormality 10/01/2021   Parkinsonism (HCC) 10/01/2021   Thoracic aortic aneurysm (HCC) 08/16/2020   Bilateral lower extremity edema 06/11/2019   Monoclonal gammopathy of unknown significance 01/08/2013   Myeloproliferative disorder (HCC) 10/09/2012   Cervicalgia 09/03/2012   Diabetes mellitus type 2 with complications (HCC) 09/02/2012   Diabetic neuropathy (HCC) 09/02/2012   AKI (acute kidney injury) (HCC) 06/18/2012   Orthostatic hypotension 06/18/2012   Gait instability 06/18/2012   Protein-calorie malnutrition, severe (HCC) 06/18/2012   Dizziness of unknown cause 06/17/2012   Acute renal failure (HCC) 06/17/2012   Leukocytopenia 06/17/2012   Thrombocytosis 06/17/2012   HYPERLIPIDEMIA 02/29/2008   Coronary atherosclerosis 02/29/2008   GERD 02/29/2008   LOW BACK PAIN 02/29/2008   DIABETES MELLITUS, TYPE II 10/15/2007   Essential hypertension 10/15/2007   URI 10/15/2007   Gastrostomy tube placement Dysphagia Hiatal hernia   I was able to have a long conversation with the son in the room and outside the room along with the patient.  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  I explained what a hiatal  hernia is and that the portion of the stomach is above the diaphragm and that may require little bit more work to place a surgical gastrotomy tube.  However I explained that while he is a candidate for a feeding tube I am not sure if it is in the patient's best clinical interest.  I do not think it will change patient's ultimate outcome.  I think he needs hospice consult regardless.  We again discussed the risk  of surgery including but not limited to bleeding, infection, injury to surrounding structures, blood clot formation, perioperative cardiac and pulmonary events, G-tube issues such as skin irritation, dislodgment.  Son has talked with his brother as well who also is reportedly not in favor of a feeding tube.  Patient to meet later today with palliative care again.  At this point this is a decision between the patient and palliative care to make an informed decision.  He is technically a candidate for a gastrotomy tube however and might professional opinion I do not think it will ultimately alter the patient's disease course and his current functional status.  If the patient decides he wants to proceed with gastrotomy tube placement I explained that it may not happen today depending what time we are alerted.  And that generally this would not be done over the holiday weekend since it is not a surgical emergency.  Son voiced understanding.  Discussed with [facility] hospitalist   FEN: Carb, n.p.o. except med/sips -in case of procedure today ID: Ceftriaxone, metronidazole, vancomycin VTE: okay for chemical prophylaxis from surgical standpoint     I reviewed ED provider notes, Consultant Palliative Care notes, hospitalist notes, last 24 h vitals and pain scores, last 48 h intake and output, last 24 h labs and trends, and last 24 h imaging results    Disposition:  LOS: 2 days    Zyrell Carmean M. Elvan Hamel, MD, FACS General, Bariatric, & Minimally Invasive Surgery [phone] [facility]  [facility]

## 2023-04-24 NOTE — Plan of Care (Signed)
   Problem: Fluid Volume: Goal: Hemodynamic stability will improve Outcome: Progressing   Problem: Clinical Measurements: Goal: Diagnostic test results will improve Outcome: Progressing Goal: Signs and symptoms of infection will decrease Outcome: Progressing   Problem: Respiratory: Goal: Ability to maintain adequate ventilation will improve Outcome: Progressing

## 2023-04-24 NOTE — Progress Notes (Signed)
 Daily Progress Note   Patient Name: [name]       Date: 04/24/2023 DOB: Apr 05, 1932  Age: 89 y.o. MRN#: [id number] Attending Physician: Osborn Blaze, MD Primary Care Physician: [name] Form, MD Admit Date: 04/21/2023 Length of Stay: 2 days  Reason for Consultation/Follow-up: Establishing goals of care  HPI/Patient Profile:  89 y.o. 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  At baseline he ambulates with a walker, feeds himself and can carry a normal conversation, but had a fall few months ago with known pelvic fracture and has been bedbound since. He was admitted on 04/21/2023 with AKI on CKD 3A, SIRS with likely wound infection as source, acute metabolic encephalopathy, Parkinson's disease, and others.    Palliative medicine consulted for GOC conversations.  Subjective:   Subjective: Chart Reviewed. Updates received. Patient Assessed. Created space and opportunity for patient  and family to explore thoughts and feelings regarding current medical situation.  Today's Discussion: Today I saw the patient at bedside, his son [name] was also present.  We had an extensive discussion about possible options moving forward including surgical G-tube placement, with the risks and side effects discussed by the surgeon, versus going back to [facility] with hospice care.  After weighing all options and pros/cons, after extensive discussion, the patient decided to not do a surgical G-tube.  The big thing he wants right now is "cold milk."  We discussed that he is n.p.o. longer trying to treat.  However, given he is electing to go back to [facility] with hospice services, I recommended making him comfort care while he is here and while we are arranging hospice services.  I explained  comfort care as care where the patient would no longer receive aggressive medical interventions such as continuous vital signs, lab work, radiology testing, or medications not focused on comfort, peace, and dignity. This includes stopping antibiotics and weaning oxygen to room air, as these are generally not accepted as providing comfort but only prolonging the dying process artificially. All care would focus on how the patient is looking and feeling. This would include management of any symptoms that may cause discomfort, pain, shortness of breath/air hunger, increased work of breathing, cough, nausea, agitation/restlessness, anxiety, and/or secretions etc. Symptoms would be managed with medications and other non-pharmacological interventions such as spiritual support if requested, repositioning, music therapy, or therapeutic listening. Family verbalized understanding and agreement.   I described hospice as a service for patients who have a life expectancy of 6 months or less. The goal of hospice is the preservation of dignity and quality at the end phases of life. Under hospice care, the focus changes from curative to symptom relief. I explained the three setting where hospice services can be provided including the home, at a living facility (such as LTC SNF, Assisted Living, etc), and a hospice facility. I explained that acceptance to hospice in any specific location is the final decision of the hospice medical director and bed availability, if applicable. They verbalized understanding.  At the end of our discussion both patient and son are on board with no surgical G-tube, transition to comfort today, engage with hospice [facility] facility has a contract with [employer], contracting) and plan to try to discharge tomorrow back to [place] with hospice services.  I provided emotional and general support through therapeutic  listening, empathy, sharing of stories, and other techniques. I answered all  questions and addressed all concerns to the best of my ability.  After meeting with the patient his son I debriefed with the medical team, nursing team, TOC, and hospice liaison.  They will begin working on referral for hospice services.  Review of Systems  Constitutional:  Positive for fatigue.  Gastrointestinal:  Negative for abdominal pain, nausea and vomiting.    Objective:   Vital Signs:  BP 126/76 (BP Location: Left Arm)   Pulse (!) 101   Temp 99 F (37.2 C) (Oral)   Resp 18   Ht 6' (1.829 m)   Wt 72.4 kg   SpO2 96%   BMI 21.65 kg/m   Physical Exam Vitals and nursing note reviewed.  Constitutional:      General: He is sleeping. He is not in acute distress. HENT:     Head: Normocephalic and atraumatic.  Pulmonary:     Effort: Pulmonary effort is normal. No respiratory distress.  Abdominal:     General: Abdomen is flat.  Neurological:     General: No focal deficit present.     Mental Status: He is oriented to person, place, and time and easily aroused.  Psychiatric:        Mood and Affect: Mood normal.        Behavior: Behavior normal.     Palliative Assessment/Data: 20-30%    Existing Vynca/ACP Documentation: None  Assessment & Plan:   Impression: Present on Admission:  SIRS (systemic inflammatory response syndrome) (HCC)  Acute renal failure (HCC)  Coronary atherosclerosis  Diabetes mellitus type 2 with complications (HCC)  Essential hypertension  Myeloproliferative disorder (HCC)  Parkinsonism (HCC)  Protein-calorie malnutrition, severe (HCC)  Decubitus ulcer  CKD (chronic kidney disease), stage III (HCC)  Hypophosphatemia  89 year old male with acute presentation chronic comorbidities as described above. Unfortunately patient here with acute infection likely from his sacral wound since being bedbound after pelvic fracture. Very limited in ambulatory ability at this point, mostly bedbound which makes treating his wound difficult. Family is very  clear that antibiotics are not a magic fix for this. We also had a good discussion about feeding tubes and they understand feeding tubes would not provide substantial quality improvement. Family seems to be considering going back to [place] facility with hospice services in place.  Family desires for patient to be able to make his own decision, and he has capacity to do so.  He was initially wanting feeding tube and now after discussing with the surgeon he seems to be reconsidering.  Will allow time for family discussion and patient thought. Overall prognosis poor.   SUMMARY OF RECOMMENDATIONS   DNR-comfort Transition to comfort care  Anticipate discharge back to LTC [facility] with hospice services in place tomorrow Palliative medicine will follow-up tomorrow for any symptoms and to facilitate hospice  Symptom Management:  Per primary team PMT is available to assist as needed  Code Status: DNR - comfort  Prognosis: Unable to determine  Discharge Planning: To Be Determined  Discussed with: Patient, family, medical team, nursing team, hospice liaison  Thank you for allowing [place] to participate in the care of Lavoy A Fukushima PMT will continue to support holistically.  Time Total: 86 min  Detailed review of medical records (labs, imaging, vital signs), medically appropriate exam, discussed with treatment team, counseling and education to patient, family, & staff, documenting clinical information, medication management, coordination of care  Wynne Dust, NP Palliative Medicine  Team  Team Phone # [phone] (Nights/Weekends)  09/05/2020, 8:17 AM

## 2023-04-25 DIAGNOSIS — R6510 Systemic inflammatory response syndrome (SIRS) of non-infectious origin without acute organ dysfunction: Secondary | ICD-10-CM | POA: Diagnosis not present

## 2023-04-25 DIAGNOSIS — R1319 Other dysphagia: Secondary | ICD-10-CM | POA: Diagnosis not present

## 2023-04-25 DIAGNOSIS — L89150 Pressure ulcer of sacral region, unstageable: Secondary | ICD-10-CM

## 2023-04-25 DIAGNOSIS — R627 Adult failure to thrive: Secondary | ICD-10-CM | POA: Diagnosis not present

## 2023-04-25 DIAGNOSIS — G20A1 Parkinson's disease without dyskinesia, without mention of fluctuations: Secondary | ICD-10-CM

## 2023-04-25 DIAGNOSIS — G9340 Encephalopathy, unspecified: Secondary | ICD-10-CM

## 2023-04-25 DIAGNOSIS — G20C Parkinsonism, unspecified: Secondary | ICD-10-CM | POA: Diagnosis not present

## 2023-04-25 LAB — GLUCOSE, CAPILLARY: Glucose-Capillary: 329 mg/dL — ABNORMAL HIGH (ref 70–99)

## 2023-04-25 MED ORDER — HYDROCODONE-ACETAMINOPHEN 5-325 MG PO TABS: 1.0000 | ORAL_TABLET | ORAL | 0 refills | Status: AC | PRN

## 2023-04-25 NOTE — Plan of Care (Signed)
  Problem: Fluid Volume: Goal: Hemodynamic stability will improve Outcome: Progressing   Problem: Clinical Measurements: Goal: Diagnostic test results will improve Outcome: Progressing Goal: Signs and symptoms of infection will decrease Outcome: Pr

## 2023-04-25 NOTE — Care Management Important Message (Signed)
 Important Message  Patient Details No IM Letter given due to Comfort Care. Name: [name] MRN: [id number] Date of Birth: 10-12-1932   Important Message Given:  No     Curtiss Dowdy 04/25/2023, 1:08 PM

## 2023-04-25 NOTE — Progress Notes (Signed)
 Daily Progress Note   Patient Name: Marlise Simpers       Date: 04/25/2023 DOB: 1932/04/03  Age: 89 y.o. MRN#: [id number] Attending Physician: Donley Furth* Primary Care Physician: Tita Form, MD Admit Date: 04/21/2023 Length of Stay: 3 days  Reason for Consultation/Follow-up: Establishing goals of care  HPI/Patient Profile:  89 y.o. 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  At baseline he ambulates with a walker, feeds himself and can carry a normal conversation, but had a fall few months ago with known pelvic fracture and has been bedbound since. He was admitted on 04/21/2023 with AKI on CKD 3A, SIRS with likely wound infection as source, acute metabolic encephalopathy, Parkinson's disease, and others.    Palliative medicine consulted for GOC conversations.  Subjective:   Subjective: Chart Reviewed. Updates received. Patient Assessed. Created space and opportunity for patient  and family to explore thoughts and feelings regarding current medical situation.  Today's Discussion: Today I saw the patient at bedside, his son Autry Legions and John's wife were also present.  We reviewed decisions that were made yesterday - they agree with decision to pursue PEG tube. We reviewed expectations moving forward with his natural disease progression. Specifically discussed expectation of aspiration. Discussed focusing on comfort and quality of life, avoiding aggressive medical care.  Patient denies and questions or concerns. Denies shortness of breath or pain.   Throughout conversation patient is asking for cold milk - this was provided. Patient coughed post swallow with each sip. Discussed with family his dysphagia and parkinsons. They understand.   We reviewed hope for dc soon  back to [facility] with hospice support. Family understands.   Review of Systems  Constitutional:  Positive for fatigue.  Gastrointestinal:  Negative for abdominal pain, nausea and vomiting.    Objective:   Vital Signs:  BP 116/69 (BP Location: Right Arm)   Pulse (!) 109   Temp 98 F (36.7 C)   Resp 15   Ht 6' (1.829 m)   Wt 72.4 kg   SpO2 95%   BMI 21.65 kg/m   Physical Exam Vitals and nursing note reviewed.  Constitutional:      General: He is sleeping. He is not in acute distress. HENT:     Head: Normocephalic and atraumatic.  Pulmonary:     Effort: Pulmonary effort is normal. No respiratory distress.  Abdominal:     General: Abdomen is flat.  Neurological:     General: No focal deficit present.     Mental Status: He is oriented to person, place, and time and easily aroused.  Psychiatric:        Mood and Affect: Mood normal.        Behavior: Behavior normal.     Palliative Assessment/Data: 20-30%    Existing Vynca/ACP Documentation: None  Assessment & Plan:   Impression: Present on Admission:  SIRS (systemic inflammatory response syndrome) (HCC)  Acute renal failure (HCC)  Coronary atherosclerosis  Diabetes mellitus type 2 with complications (HCC)  Essential hypertension  Myeloproliferative disorder (HCC)  Parkinsonism (HCC)  Protein-calorie malnutrition, severe (HCC)  Decubitus ulcer  CKD (chronic kidney disease), stage III (HCC)  Hypophosphatemia  89 year old male with acute presentation chronic comorbidities as described above. Unfortunately patient here with acute infection likely from his sacral wound since being bedbound after pelvic fracture.  Very limited in ambulatory ability at this point, mostly bedbound which makes treating his wound difficult. Family is very clear that antibiotics are not a magic fix for this. We also had a good discussion about feeding tubes and they understand feeding tubes would not provide substantial quality  improvement. Family seems to be considering going back to [place] facility with hospice services in place.  Family desires for patient to be able to make his own decision, and he has capacity to do so.  He was initially wanting feeding tube and now after discussing with the surgeon he seems to be reconsidering.  Will allow time for family discussion and patient thought. Overall prognosis poor.   SUMMARY OF RECOMMENDATIONS   DNR-comfort Transition to comfort care  Anticipate discharge back to LTC [facility] with hospice services in place tomorrow Palliative medicine will follow-up tomorrow for any symptoms and to facilitate hospice  Symptom Management:  Per primary team PMT is available to assist as needed  Code Status: DNR - comfort  Prognosis: Unable to determine  Discharge Planning: To Be Determined  Discussed with: Patient, family, medical team, nursing team, hospice liaison  Thank you for allowing us  to participate in the care of Collis A Obst PMT will continue to support holistically.  Time Total: 30 minutes  Detailed review of medical records (labs, imaging, vital signs), medically appropriate exam, discussed with treatment team, counseling and education to patient, family, & staff, documenting clinical information, medication management, coordination of care  Jakaiden Fill Milagros Alf, DNP, [facility] Palliative Medicine Team Team Phone # [phone]  Pager # [phone]

## 2023-04-25 NOTE — Plan of Care (Signed)
 Pt discharged to [facility] community center. P/u by PTAR. Accompanied by son and daughter in law. Discharge instructions given to PTAR. Report given to Nurse Lenward from [place]. Vitals stable. IV removed.  Problem: Fluid Volume: Goal: Hemodynamic stability will improve Outcome: Adequate for Discharge   Problem: Clinical Measurements: Goal: Diagnostic test results will improve Outcome: Adequate for Discharge Goal: Signs and symptoms of infection will decrease Outcome: Adequate for Discharge   Problem: Respiratory: Goal: Ability to maintain adequate ventilation will improve Outcome: Adequate for Discharge   Problem: Education: Goal: Knowledge of General Education information will improve Description: Including pain rating scale, medication(s)/side effects and non-pharmacologic comfort measures Outcome: Adequate for Discharge   Problem: Health Behavior/Discharge Planning: Goal: Ability to manage health-related needs will improve Outcome: Adequate for Discharge   Problem: Clinical Measurements: Goal: Ability to maintain clinical measurements within normal limits will improve Outcome: Adequate for Discharge Goal: Will remain free from infection Outcome: Adequate for Discharge Goal: Diagnostic test results will improve Outcome: Adequate for Discharge Goal: Respiratory complications will improve Outcome: Adequate for Discharge Goal: Cardiovascular complication will be avoided Outcome: Adequate for Discharge   Problem: Activity: Goal: Risk for activity intolerance will decrease Outcome: Adequate for Discharge   Problem: Nutrition: Goal: Adequate nutrition will be maintained Outcome: Adequate for Discharge   Problem: Coping: Goal: Level of anxiety will decrease Outcome: Adequate for Discharge   Problem: Elimination: Goal: Will not experience complications related to bowel motility Outcome: Adequate for Discharge Goal: Will not experience complications related  to urinary retention Outcome: Adequate for Discharge   Problem: Pain Managment: Goal: General experience of comfort will improve and/or be controlled Outcome: Adequate for Discharge   Problem: Safety: Goal: Ability to remain free from injury will improve Outcome: Adequate for Discharge   Problem: Skin Integrity: Goal: Risk for impaired skin integrity will decrease Outcome: Adequate for Discharge   Problem: Education: Goal: Ability to describe self-care measures that may prevent or decrease complications (Diabetes Survival Skills Education) will improve Outcome: Adequate for Discharge Goal: Individualized Educational Video(s) Outcome: Adequate for Discharge   Problem: Coping: Goal: Ability to adjust to condition or change in health will improve Outcome: Adequate for Discharge   Problem: Fluid Volume: Goal: Ability to maintain a balanced intake and output will improve Outcome: Adequate for Discharge   Problem: Health Behavior/Discharge Planning: Goal: Ability to identify and utilize available resources and services will improve Outcome: Adequate for Discharge Goal: Ability to manage health-related needs will improve Outcome: Adequate for Discharge   Problem: Metabolic: Goal: Ability to maintain appropriate glucose levels will improve Outcome: Adequate for Discharge   Problem: Nutritional: Goal: Maintenance of adequate nutrition will improve Outcome: Adequate for Discharge Goal: Progress toward achieving an optimal weight will improve Outcome: Adequate for Discharge   Problem: Skin Integrity: Goal: Risk for impaired skin integrity will decrease Outcome: Adequate for Discharge   Problem: Tissue Perfusion: Goal: Adequacy of tissue perfusion will improve Outcome: Adequate for Discharge   Problem: Education: Goal: Knowledge of the prescribed therapeutic regimen will improve Outcome: Adequate for Discharge   Problem: Coping: Goal: Ability to identify and develop  effective coping behavior will improve Outcome: Adequate for Discharge   Problem: Clinical Measurements: Goal: Quality of life will improve Outcome: Adequate for Discharge   Problem: Respiratory: Goal: Verbalizations of increased ease of respirations will increase Outcome: Adequate for Discharge   Problem: Role Relationship: Goal: Family's ability to cope with current situation will improve Outcome: Adequate for Discharge Goal: Ability to verbalize concerns, feelings,  and thoughts to partner or family member will improve Outcome: Adequate for Discharge   Problem: Pain Management: Goal: Satisfaction with pain management regimen will improve Outcome: Adequate for Discharge

## 2023-04-25 NOTE — [place] FL2 (Signed)
 [place]  MEDICAID FL2 LEVEL OF CARE FORM     IDENTIFICATION  Patient Name: [name] Birthdate: August 06, 1932 Sex: male Admission Date (Current Location): 04/21/2023  [facility] and [place] Number:  Producer, Television/film/video and Address:  [facility],  [address] [place], [place] [address]      Provider Number: [id number]  Attending Physician Name and Address:  Dana Ivan Masse*  Relative Name and Phone Number:  Akeen, Ledyard (Son)  [phone] [facility])    Current Level of Care: Hospital Recommended Level of Care: Skilled Nursing Facility Prior Approval Number:    Date Approved/Denied:   PASRR Number: [id number]  Discharge Plan: SNF    Current Diagnoses: Patient Active Problem List   Diagnosis Date Noted   Malnutrition of moderate degree 04/23/2023   SIRS (systemic inflammatory response syndrome) (HCC) 04/21/2023   Decubitus ulcer 04/21/2023   CKD (chronic kidney disease), stage III (HCC) 04/21/2023   Hypophosphatemia 04/21/2023   Acetabulum fracture (HCC) 12/11/2022   Gait abnormality 10/01/2021   Parkinsonism (HCC) 10/01/2021   Thoracic aortic aneurysm (HCC) 08/16/2020   Bilateral lower extremity edema 06/11/2019   Monoclonal gammopathy of unknown significance 01/08/2013   Myeloproliferative disorder (HCC) 10/09/2012   Cervicalgia 09/03/2012   Diabetes mellitus type 2 with complications (HCC) 09/02/2012   Diabetic neuropathy (HCC) 09/02/2012   AKI (acute kidney injury) (HCC) 06/18/2012   Orthostatic hypotension 06/18/2012   Gait instability 06/18/2012   Protein-calorie malnutrition, severe (HCC) 06/18/2012   Dizziness of unknown cause 06/17/2012   Acute renal failure (HCC) 06/17/2012   Leukocytopenia 06/17/2012   Thrombocytosis 06/17/2012   HYPERLIPIDEMIA 02/29/2008   Coronary atherosclerosis 02/29/2008   GERD 02/29/2008   LOW BACK PAIN 02/29/2008   DIABETES MELLITUS, TYPE II 10/15/2007   Essential hypertension 10/15/2007   URI  10/15/2007    Orientation RESPIRATION BLADDER Height & Weight     Self, Time, Situation, Place  Normal Incontinent Weight: 159 lb 9.8 oz (72.4 kg) Height:  6' (182.9 cm)  BEHAVIORAL SYMPTOMS/MOOD NEUROLOGICAL BOWEL NUTRITION STATUS      Incontinent Diet (Regular)  AMBULATORY STATUS COMMUNICATION OF NEEDS Skin   Limited Assist Verbally PU Stage and Appropriate Care (Pressure Injury Coccyx Stage 3- Full thickness tissue loss. Subcutaneous fat may be visible but bone, tendon or muscle are NOT exposed. Pressure Injury 04/21/23 Heel Left Deep Tissue Pressure Injury- Purple localized area of discolored intact skin)                       Personal Care Assistance Level of Assistance  Bathing, Feeding, Dressing Bathing Assistance: Limited assistance Feeding assistance: Limited assistance Dressing Assistance: Limited assistance     Functional Limitations Info  Speech, Sight, Hearing Sight Info: Impaired Hearing Info: Adequate Speech Info: Adequate    SPECIAL CARE FACTORS FREQUENCY                       Contractures Contractures Info: Not present    Additional Factors Info  Code Status, Allergies Code Status Info: DNR Allergies Info: No Known Allergies           Current Medications (04/25/2023):  This is the current hospital active medication list Current Facility-Administered Medications  Medication Dose Route Frequency Provider Last Rate Last Admin   acetaminophen  (TYLENOL ) tablet 650 mg  650 mg Oral Q6H PRN Doutova, Anastassia, MD   650 mg at 04/23/23 9072   Or   acetaminophen  (TYLENOL ) suppository 650 mg  650 mg Rectal  Q6H PRN Doutova, Anastassia, MD       albuterol  (PROVENTIL ) (2.5 MG/3ML) 0.083% nebulizer solution 2.5 mg  2.5 mg Nebulization Q2H PRN Doutova, Anastassia, MD       carbidopa -levodopa  (SINEMET  IR) 25-100 MG per tablet immediate release 2 tablet  2 tablet Oral TID AC Gherghe, Costin M, MD   2 tablet at 04/24/23 1730   Chlorhexidine  Gluconate Cloth 2  % PADS 6 each  6 each Topical Daily Doutova, Anastassia, MD   6 each at 04/24/23 9062   feeding supplement (ENSURE ENLIVE / ENSURE PLUS) liquid 237 mL  237 mL Oral BID BM Gherghe, Costin M, MD   237 mL at 04/24/23 1335   fentaNYL  (SUBLIMAZE ) injection 12.5-50 mcg  12.5-50 mcg Intravenous Q2H PRN Doutova, Anastassia, MD       HYDROcodone -acetaminophen  (NORCO/VICODIN) 5-325 MG per tablet 1-2 tablet  1-2 tablet Oral Q4H PRN Doutova, Anastassia, MD   2 tablet at 04/25/23 0137   lactulose  (CHRONULAC ) 10 GM/15ML solution 20 g  20 g Oral BID PRN Doutova, Anastassia, MD       leptospermum manuka honey (MEDIHONEY) paste 1 Application  1 Application Topical Daily Doutova, Anastassia, MD   1 Application at 04/24/23 0936   mirtazapine  (REMERON ) tablet 7.5 mg  7.5 mg Oral QHS Gherghe, Costin M, MD   7.5 mg at 04/23/23 2207   ondansetron  (ZOFRAN ) tablet 4 mg  4 mg Oral Q6H PRN Doutova, Anastassia, MD       Or   ondansetron  (ZOFRAN ) injection 4 mg  4 mg Intravenous Q6H PRN Doutova, Anastassia, MD       Oral care mouth rinse  15 mL Mouth Rinse PRN Doutova, Anastassia, MD       pantoprazole  (PROTONIX ) EC tablet 40 mg  40 mg Oral Q0600 Doutova, Anastassia, MD   40 mg at 04/24/23 0616   polyethylene glycol (MIRALAX  / GLYCOLAX ) packet 17 g  17 g Oral Daily Doutova, Anastassia, MD       senna (SENOKOT) tablet 8.6 mg  1 tablet Oral BID Doutova, Anastassia, MD   8.6 mg at 04/24/23 9063     Discharge Medications: Please see discharge summary for a list of discharge medications.  Relevant Imaging Results:  Relevant Lab Results:   Additional Information SSN [id number]  Sheri ONEIDA [place], [place]

## 2023-04-25 NOTE — Discharge Summary (Signed)
 [name] FMW:[id number] DOB: [birth year] DOA: 04/21/2023  PCP: Caleen Dirks, MD  Admit date: 04/21/2023  Discharge date: 04/25/2023  Admitted From: [facility]   Disposition:  Hospice    Recommendations for Outpatient Follow-up:   Follow up with  Hospice physican  Home Health: per hospice   Equipment/Devices: bedbound  Consultations: palliative care, general surgery Discharge Condition: to  hospice   CODE STATUS: DNR   Diet Recommendation: Regular   Diet Order             Diet regular Room service appropriate? 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  At baseline he ambulates with a walker, feeds himself and can carry a normal conversation, but had a fall few months ago with known pelvic fracture and has been bedbound since. He was admitted on 04/21/2023 with AKI on CKD 3A, SIRS with likely wound infection as source, acute metabolic encephalopathy, Parkinson's disease,                                                                   Hospital Course    Patient was treated wfor sirs, hyperglycemia and aki and acute metabolic encephalopathy all of which improved. Patient was noted to have parkinsons dysphagia but it was not clear if he would be a candidate for feeding tube. After discussion with palliative care, patient was placed on comfort care with plans to be  referred to hospice. All of his medication that do not contribute to comfort have been discontinued. Whether to continue parkinson medications will be left to discretion of hospice staff.    Discharge diagnosis     Principal Problem:   SIRS (systemic inflammatory response syndrome) (HCC) Active Problems:   Acute renal failure (HCC)   Protein-calorie malnutrition, severe (HCC)   Essential hypertension   Coronary atherosclerosis   Diabetes mellitus type 2 with complications (HCC)   Myeloproliferative disorder (HCC)   Parkinsonism (HCC)   Decubitus ulcer   CKD (chronic kidney disease), stage III (HCC)   Hypophosphatemia   Malnutrition of moderate degree    Discharge instructions    Discharge Instructions     Discharge wound care:   Complete by: As directed    Comments: 1.        Cleanse buttocks/sacrum/coccyx wound with Vashe  wound cleanser Soila [phone]), do not rinse and allow to air dry.  Apply Medihoney to entire wound bed daily and cover with dry gauze (make sure to pack any area of depth with dry gauze as well), cover with silicone foam or ABD pad and tape.   2.         Clean R heel with Vashe wound cleanser Soila [phone]), do not rinse and allow to air dry. Cover heel with Xeroform gauze Soila [phone]) daily, cover with dry gauze and wrap with Kerlix roll gauze.  Place R foot into Prevalon boot Soila [phone]).       Discharge Medications   Allergies as of 04/25/2023   No Known Allergies      Medication List     STOP taking these medications    aspirin  EC 81 MG tablet   furosemide  40 MG tablet Commonly known as: LASIX    metoprolol  tartrate 50 MG tablet Commonly known as: LOPRESSOR    mirtazapine  7.5 MG tablet Commonly known as: REMERON    multivitamin with minerals Tabs tablet   PROSOURCE PROTEIN PO       TAKE these medications    acetaminophen  650 MG CR tablet Commonly known as: TYLENOL  Take 650 mg by mouth every 8 (eight) hours as needed for  pain.   ammonium lactate 12 % lotion Commonly known as: LAC-HYDRIN 1 Application at bedtime. Apply to bilateral legs and feet; do not apply to right heel wound   Biofreeze Roll-On 4 % Gel Generic drug: Menthol (Topical Analgesic) Apply 1 Application topically every 4 (four) hours as needed. For mild/moderate pain   carbidopa -levodopa  25-100 MG tablet Commonly known as: SINEMET  IR Take 2 tablets by mouth 3 (three) times daily. At 8am, noon, 5pm What changed: additional instructions   HYDROcodone -acetaminophen  5-325 MG tablet Commonly known as: NORCO/VICODIN Take 1-2 tablets by mouth every 4 (four) hours as needed for moderate pain (pain score 4-6).   hydroxyurea  500 MG capsule Commonly known as: HYDREA  TAKE 1 CAPSULE BY MOUTH ONCE DAILY WITH FOOD What changed:  when to take this additional instructions   lactulose  10 GM/15ML solution Commonly known as: CHRONULAC  Take 30 mLs (20 g total) by mouth 2 (two) times daily as needed for moderate constipation or severe constipation.   magnesium hydroxide 400 MG/5ML suspension Commonly known as: MILK OF MAGNESIA Take 30 mLs by mouth daily as needed for mild constipation (If no BM in 3 days).   methocarbamol  500 MG tablet Commonly known as: ROBAXIN  Take 1 tablet (500 mg total) by mouth every 6 (six) hours as needed for muscle spasms.   pantoprazole  40 MG tablet Commonly known as: PROTONIX  Take 1 tablet (40 mg total) by mouth daily at 6 (six) AM.   polyethylene glycol 17 g packet Commonly known as: MIRALAX  / GLYCOLAX  Take 17 g by mouth daily.   senna 8.6 MG Tabs tablet Commonly known as: SENOKOT Take 1 tablet (8.6 mg total) by mouth 2 (two) times daily.   zinc oxide 20 % ointment Apply 1 Application topically 3 (three) times daily as needed for irritation. Apply to sacrum/coccyx every shift               Discharge Care Instructions  (From admission, onward)           Start     Ordered   04/25/23 0000  Discharge  wound care:       Comments: Comments: 1.        Cleanse buttocks/sacrum/coccyx wound with  Vashe wound cleanser Soila [phone]), do not rinse and allow to air dry.  Apply Medihoney to entire wound bed daily and cover with dry gauze (make sure to pack any area of depth with dry gauze as well), cover with silicone foam or ABD pad and tape.   2.         Clean R heel with Vashe wound cleanser Soila [phone]), do not rinse and allow to air dry. Cover heel with Xeroform gauze Soila [phone]) daily, cover with dry gauze and wrap with Kerlix roll gauze.  Place R foot into Prevalon boot Soila [phone]).   04/25/23 1042              Major procedures and Radiology Reports - PLEASE review detailed and final reports thoroughly  -       MR PELVIS W WO CONTRAST Result Date: 04/24/2023 CLINICAL DATA:  Sacral decubitus ulcer. EXAM: MRI PELVIS WITHOUT AND WITH CONTRAST TECHNIQUE: Multiplanar multisequence MR imaging of the pelvis was performed both before and after administration of intravenous contrast. CONTRAST:  7mL GADAVIST  GADOBUTROL  1 MMOL/ML IV SOLN COMPARISON:  None Available. CT chest, abdomen, and pelvis 04/21/2023 FINDINGS: Soft tissues and bones: There is a defect of the midline to left posterior pelvic soft tissues at the level of the inferior S5 through the inferior buttock indicating a decubitus ulcer. In its greatest region, this ulcer measures up to approximately 2.1 cm in depth (axial series 10, image 42), 12 cm in greatest transverse dimension, and 9 cm in craniocaudal length. There is a thin channel of fluid bright signal extending anteriorly and to the right of the deep aspect of this ulcer (axial images 24 through 37), and this thin ulcer/sinus tract broadly borders the posterior aspect of the posterior aspect of the sacrum and coccyx from the S4 level inferiorly to the tip of the coccyx. There is abnormal marrow edema and mild enhancement within the posterior aspect of the first and second  coccygeal segments with likely minimal posterior cortical thinning indicating mild posterior osteomyelitis (sagittal series 13, image 33). More inferiorly, there is edema and enhancement within the attenuated subcutaneous fat between the deep aspect of this ulcer and the posterior aspect of the distal rectum (axial images 45 through 48 of 50). There is a 3 mm thick linear region of increased T2 signal and enhancement from the posterior ulcer contacting the posterior wall the rectum, and it is difficult to exclude a tiny posterior fistula within the distal rectum connecting to the deep aspect of the posterior soft tissue ulcer. Recommend clinical correlation. Moderate to severe left and moderate right superior femoroacetabular cartilage thinning. Mild-to-moderate left mild right superior femoral subchondral marrow edema. Muscles and tendons: There is moderate to high-grade right and moderate left groin adductor muscle edema. There is mild fluid tracking between the right greater than left inferior aspect of the operator externus muscles and the adductor brevis muscles. High-grade edema within the anterior aspect of the bilateral gluteus minimus muscles. Mild fluid also tracks along the peripheral lateral aspect of the bilateral anterior gluteus medius muscles, possible inflammatory change and/or muscle strain with small interstitial tears (axial series 10, images 13 through 25). Other: There appears to be a bladder diverticulum extending posteriorly from the mid to left aspect of the posterior bladder wall, measuring up to approximately 3.2 cm in transverse dimension, 1.6 cm in AP depth, and 3.1 cm in craniocaudal height (axial image 25 and sagittal image 36). Mild distal sigmoid diverticulosis. IMPRESSION: 1. Midline  to left posterior pelvic soft tissue decubitus ulcer with a thin channel of fluid bright signal sinus tract extending anteriorly and to the right of the deep aspect of this ulcer, broadly bordering the  posterior aspect of the sacrum and coccyx from the S4 level inferiorly to the tip of the coccyx. There is osteomyelitis of the posterior aspect of the first and second coccygeal segments. 2. More inferiorly, there is edema and enhancement within the attenuated subcutaneous fat between the deep aspect of the posterior decubitus ulcer and the posterior aspect of the distal rectum. There is a 3 mm thick linear region of increased T2 signal and enhancement from the posterior ulcer contacting the posterior wall the rectum and possibly the rectal lumen, and it is difficult to exclude a tiny posterior fistula within the distal rectum connecting to the deep aspect of the posterior soft tissue ulcer. Recommend clinical correlation. 3. There is moderate to high-grade right and moderate left groin adductor muscle edema. There is mild fluid tracking between the right greater than left inferior aspect of the operator externus muscles and the adductor brevis muscles. High-grade edema within the anterior aspect of the bilateral gluteus minimus and medius muscles. Electronically Signed   By: Tanda Lyons M.D.   On: 04/24/2023 12:43   CT CHEST ABDOMEN PELVIS W CONTRAST Result Date: 04/21/2023 CLINICAL DATA:  Sepsis EXAM: CT CHEST, ABDOMEN, AND PELVIS WITH CONTRAST TECHNIQUE: Multidetector CT imaging of the chest, abdomen and pelvis was performed following the standard protocol during bolus administration of intravenous contrast. RADIATION DOSE REDUCTION: This exam was performed according to the departmental dose-optimization program which includes automated exposure control, adjustment of the mA and/or kV according to patient size and/or use of iterative reconstruction technique. CONTRAST:  OMNIPAQUE  IOHEXOL  300 MG/ML  SOLN COMPARISON:  None Available. FINDINGS: CT CHEST FINDINGS Cardiovascular: Heart is normal size. Densely calcified coronary arteries. Aneurysmal dilatation of the ascending thoracic aorta measuring 4.5  cm. Calcified and noncalcified irregular plaque throughout the descending thoracic aorta. No dissection. No filling defects in the pulmonary arteries to suggest pulmonary emboli. Mediastinum/Nodes: No mediastinal, hilar, or axillary adenopathy. Trachea and esophagus are unremarkable. Thyroid  unremarkable. Lungs/Pleura: Small left pleural effusion. Dependent atelectasis in the lower lobes bilaterally. Musculoskeletal: Chest wall soft tissues are unremarkable. No acute bony abnormality CT ABDOMEN PELVIS FINDINGS Hepatobiliary: Small layering gallstones within the gallbladder. Diffuse low-density throughout the liver compatible with fatty infiltration. No focal hepatic abnormality. Pancreas: No focal abnormality or ductal dilatation. Spleen: Splenomegaly with a craniocaudal length of 17 cm. No focal abnormality. Adrenals/Urinary Tract: No adrenal abnormality. No focal renal abnormality. No stones or hydronephrosis. Urinary bladder is unremarkable. Stomach/Bowel: Large stool burden throughout the colon. Moderate hiatal hernia. No bowel obstruction or inflammatory process. Few scattered sigmoid diverticula. Vascular/Lymphatic: Aortic atherosclerosis. No evidence of aneurysm or adenopathy. Reproductive: No visible focal abnormality. Other: No free fluid or free air. Musculoskeletal: Moderate L2 compression fracture, age indeterminate. Normal alignment. Degenerative disc and facet disease throughout the lumbar spine. IMPRESSION: 4.5 cm ascending thoracic aortic aneurysm. Recommend semi-annual imaging followup by CTA or MRA and referral to cardiothoracic surgery if not already obtained. This recommendation follows 2010 ACCF/AHA/AATS/ACR/ASA/SCA/SCAI/SIR/STS/SVM Guidelines for the Diagnosis and Management of Patients With Thoracic Aortic Disease. Circulation. 2010; 121: Z733-z630. Aortic aneurysm NOS (ICD10-I71.9) Coronary artery disease, aortic atherosclerosis. Small left pleural effusion. Bibasilar dependent opacities,  likely atelectasis. Moderate hiatal hernia. Moderate stool burden throughout the colon. Splenomegaly. Age-indeterminate moderate L2 compression fracture. Electronically Signed   By: Franky  Dover M.D.   On: 04/21/2023 18:25   CT HEAD WO CONTRAST ( ) Result Date: 04/21/2023 CLINICAL DATA:  Mental status change, unknown cause.  Sepsis. EXAM: CT HEAD WITHOUT CONTRAST TECHNIQUE: Contiguous axial images were obtained from the base of the skull through the vertex without intravenous contrast. RADIATION DOSE REDUCTION: This exam was performed according to the departmental dose-optimization program which includes automated exposure control, adjustment of the mA and/or kV according to patient size and/or use of iterative reconstruction technique. COMPARISON:  Head MRI 10/17/2021 FINDINGS: Brain: There is no evidence of an acute infarct, intracranial hemorrhage, mass, midline shift, or extra-axial fluid collection. Periventricular white matter hypodensities are nonspecific but compatible with mild chronic small vessel ischemic disease. Moderate to severe cerebral atrophy is again noted including prominent perisylvian volume loss. Vascular: No hyperdense vessel or unexpected calcification. Skull: No acute fracture or suspicious lesion. Sinuses/Orbits: Visualized paranasal sinuses and mastoid air cells are clear. Bilateral cataract extraction. Other: None. IMPRESSION: 1. No evidence of acute intracranial abnormality. 2. Mild chronic small vessel ischemic disease and moderate to severe cerebral atrophy. Electronically Signed   By: Dasie Hamburg M.D.   On: 04/21/2023 18:05   DG Chest Port 1 View Result Date: 04/21/2023 CLINICAL DATA:  Questionable sepsis. EXAM: PORTABLE CHEST 1 VIEW COMPARISON:  12/11/2022 FINDINGS: Low volume film. Mild asymmetric elevation right hemidiaphragm. Subtle airspace disease noted left base, possible pneumonia. No pneumothorax or pleural effusion. No acute bony abnormality. IMPRESSION: Subtle  airspace disease left base, possible pneumonia. Electronically Signed   By: Camellia Candle M.D.   On: 04/21/2023 11:10    Micro Results   Recent Results (from the past 240 hours)  Blood Culture (routine x 2)     Status: None (Preliminary result)   Collection Time: 04/21/23 11:20 AM   Specimen: BLOOD LEFT FOREARM  Result Value Ref Range Status   Specimen Description   Final    BLOOD LEFT FOREARM Performed at [facility], [address], [place], [place] [address]    Special Requests   Final    BOTTLES DRAWN AEROBIC AND ANAEROBIC Blood Culture adequate volume Performed at [facility], [address], [place], [place] [address]    Culture   Final    NO GROWTH 4 DAYS Performed at [facility], [address], [place], [place] [address]    Report Status PENDING  Incomplete  Blood Culture (routine x 2)     Status: None (Preliminary result)   Collection Time: 04/21/23 11:20 AM   Specimen: BLOOD RIGHT FOREARM  Result Value Ref Range Status   Specimen Description   Final    BLOOD RIGHT FOREARM Performed at [facility], [address], [place], [place] [address]    Special Requests   Final    BOTTLES DRAWN AEROBIC AND ANAEROBIC Blood Culture adequate volume Performed at [facility], [address] [place], [place] [address]    Culture   Final    NO GROWTH 4 DAYS Performed at [facility], [address] [place], [place] [address]    Report Status PENDING  Incomplete  Resp panel by RT-PCR (RSV, Flu A&B, Covid)     Status: None   Collection Time: 04/21/23 11:25 AM   Specimen: Nasal Swab  Result Value Ref Range Status   SARS Coronavirus 2 by RT PCR NEGATIVE NEGATIVE Final    Comment: (NOTE) SARS-CoV-2 target nucleic acids are NOT DETECTED.  The SARS-CoV-2 RNA  is generally detectable in upper respiratory specimens during the acute phase of infection. The lowest concentration of SARS-CoV-2 viral copies this assay  can detect is 138 copies/mL. A negative result does not preclude SARS-Cov-2 infection and should not be used as the sole basis for treatment or other patient management decisions. A negative result may occur with  improper specimen collection/handling, submission of specimen other than nasopharyngeal swab, presence of viral mutation(s) within the areas targeted by this assay, and inadequate number of viral copies(<138 copies/mL). A negative result must be combined with clinical observations, patient history, and epidemiological information. The expected result is Negative.  Fact Sheet for Patients:  bloggercourse.com  Fact Sheet for Healthcare Providers:  seriousbroker.it  This test is no t yet approved or cleared by the [place]  FDA and  has been authorized for detection and/or diagnosis of SARS-CoV-2 by FDA under an Emergency Use Authorization (EUA). This EUA will remain  in effect (meaning this test can be used) for the duration of the COVID-19 declaration under Section 564(b)(1) of the Act, 21 U.S.C.section 360bbb-3(b)(1), unless the authorization is terminated  or revoked sooner.       Influenza A by PCR NEGATIVE NEGATIVE Final   Influenza B by PCR NEGATIVE NEGATIVE Final    Comment: (NOTE) The Xpert Xpress SARS-CoV-2/FLU/RSV plus assay is intended as an aid in the diagnosis of influenza from Nasopharyngeal swab specimens and should not be used as a sole basis for treatment. Nasal washings and aspirates are unacceptable for Xpert Xpress SARS-CoV-2/FLU/RSV testing.  Fact Sheet for Patients: bloggercourse.com  Fact Sheet for Healthcare Providers: seriousbroker.it  This test is not yet approved or cleared by the [place]  FDA and has been authorized for detection and/or diagnosis of SARS-CoV-2 by FDA under an Emergency Use Authorization (EUA). This EUA will  remain in effect (meaning this test can be used) for the duration of the COVID-19 declaration under Section 564(b)(1) of the Act, 21 U.S.C. section 360bbb-3(b)(1), unless the authorization is terminated or revoked.     Resp Syncytial Virus by PCR NEGATIVE NEGATIVE Final    Comment: (NOTE) Fact Sheet for Patients: bloggercourse.com  Fact Sheet for Healthcare Providers: seriousbroker.it  This test is not yet approved or cleared by the [place]  FDA and has been authorized for detection and/or diagnosis of SARS-CoV-2 by FDA under an Emergency Use Authorization (EUA). This EUA will remain in effect (meaning this test can be used) for the duration of the COVID-19 declaration under Section 564(b)(1) of the Act, 21 U.S.C. section 360bbb-3(b)(1), unless the authorization is terminated or revoked.  Performed at [facility], [address] [place], [place] [address]   Respiratory (~20 pathogens) panel by PCR     Status: None   Collection Time: 04/21/23  7:42 PM   Specimen: Nasopharyngeal Swab; Respiratory  Result Value Ref Range Status   Adenovirus NOT DETECTED NOT DETECTED Final   Coronavirus 229E NOT DETECTED NOT DETECTED Final    Comment: (NOTE) The Coronavirus on the Respiratory Panel, DOES NOT test for the novel  Coronavirus (2019 nCoV)    Coronavirus HKU1 NOT DETECTED NOT DETECTED Final   Coronavirus NL63 NOT DETECTED NOT DETECTED Final   Coronavirus OC43 NOT DETECTED NOT DETECTED Final   Metapneumovirus NOT DETECTED NOT DETECTED Final   Rhinovirus / Enterovirus NOT DETECTED NOT DETECTED Final   Influenza A NOT DETECTED NOT DETECTED Final   Influenza B NOT DETECTED NOT DETECTED Final   Parainfluenza Virus 1 NOT DETECTED NOT DETECTED  Final   Parainfluenza Virus 2 NOT DETECTED NOT DETECTED Final   Parainfluenza Virus 3 NOT DETECTED NOT DETECTED Final   Parainfluenza Virus 4 NOT DETECTED NOT DETECTED  Final   Respiratory Syncytial Virus NOT DETECTED NOT DETECTED Final   Bordetella pertussis NOT DETECTED NOT DETECTED Final   Bordetella Parapertussis NOT DETECTED NOT DETECTED Final   Chlamydophila pneumoniae NOT DETECTED NOT DETECTED Final   Mycoplasma pneumoniae NOT DETECTED NOT DETECTED Final    Comment: Performed at [facility], [address], [place], [place] [address]  MRSA Next Gen by PCR, Nasal     Status: None   Collection Time: 04/21/23 10:33 PM   Specimen: Nasal Mucosa; Nasal Swab  Result Value Ref Range Status   MRSA by PCR Next Gen NOT DETECTED NOT DETECTED Final    Comment: (NOTE) The GeneXpert MRSA Assay (FDA approved for NASAL specimens only), is one component of a comprehensive MRSA colonization surveillance program. It is not intended to diagnose MRSA infection nor to guide or monitor treatment for MRSA infections. Test performance is not FDA approved in patients less than 55 years old. Performed at [facility], [address] [place], [place] [address]     Today   Subjective    [name] states he appreciates conversations with palliative care and understands he is being discharged to hospice for ongoing comfort care.   Objective   Blood pressure 116/69, pulse (!) 109, temperature 98 F (36.7 C), resp. rate 15, height 6' (1.829 m), weight 72.4 kg, SpO2 95%.   Intake/Output Summary (Last 24 hours) at 04/25/2023 1042 Last data filed at 04/24/2023 2300 Gross per 24 hour  Intake 20 ml  Output 1000 ml  Net -980 ml    Exam Patient lying in bed with no acute distress Full physical exam deferred Patient appeared comfortable and in reasonable spirits.    Data Review   CBC w Diff:  Lab Results  Component Value Date   WBC 15.3 (H) 04/24/2023   HGB 15.1 04/24/2023   HGB 14.8 01/01/2022   HGB 14.4 10/29/2016   HCT 48.0 04/24/2023   HCT 48.2 01/01/2022   HCT 45.9 10/29/2016   PLT 452 (H) 04/24/2023   PLT 499 (H)  01/01/2022   LYMPHOPCT 5 04/21/2023   LYMPHOPCT 19.1 10/29/2016   MONOPCT 6 04/21/2023   MONOPCT 14.3 (H) 10/29/2016   EOSPCT 0 04/21/2023   EOSPCT 2.1 10/29/2016   BASOPCT 0 04/21/2023   BASOPCT 2.2 (H) 10/29/2016    CMP:  Lab Results  Component Value Date   NA 138 04/24/2023   NA 131 (L) 01/01/2022   NA 137 10/29/2016   K 4.4 04/24/2023   K 4.9 10/29/2016   CL 107 04/24/2023   CO2 22 04/24/2023   CO2 25 10/29/2016   BUN 25 (H) 04/24/2023   BUN 17 01/01/2022   BUN 13.5 10/29/2016   CREATININE 0.99 04/24/2023   CREATININE 1.22 09/20/2021   CREATININE 1.2 10/29/2016   PROT 6.7 04/23/2023   PROT 8.1 01/01/2022   PROT 7.8 10/29/2016   ALBUMIN 2.4 (L) 04/23/2023   ALBUMIN 4.0 01/01/2022   ALBUMIN 3.6 10/29/2016   BILITOT 1.2 04/23/2023   BILITOT 0.6 01/01/2022   BILITOT 0.5 09/20/2021   BILITOT 0.46 10/29/2016   ALKPHOS 52 04/23/2023   ALKPHOS 62 10/29/2016   AST 13 (L) 04/23/2023   AST 11 (L) 09/20/2021   AST 18 10/29/2016   ALT <5 04/23/2023   ALT  12 09/20/2021   ALT 12 10/29/2016  .   Total Time in preparing paper work, data evaluation and todays exam - 35 minutes  Ivan Vangie Pike M.D on 04/25/2023 at 10:42 AM  [facility]

## 2023-04-26 LAB — CULTURE, BLOOD (ROUTINE X 2)
Culture: NO GROWTH
Culture: NO GROWTH
Special Requests: ADEQUATE
Special Requests: ADEQUATE

## 2023-05-08 DEATH — deceased

## 2023-08-18 ENCOUNTER — Encounter: Payer: Self-pay | Admitting: Internal Medicine
